# Patient Record
Sex: Female | Born: 1935 | Race: White | Hispanic: No | Smoking: Never smoker
Health system: Southern US, Community
[De-identification: ages and names within clinical notes are randomized; demographics above are authoritative.]

## PROBLEM LIST (undated history)

## (undated) DIAGNOSIS — J449 Chronic obstructive pulmonary disease, unspecified: Secondary | ICD-10-CM

## (undated) DIAGNOSIS — R42 Dizziness and giddiness: Secondary | ICD-10-CM

## (undated) DIAGNOSIS — I471 Supraventricular tachycardia, unspecified: Secondary | ICD-10-CM

## (undated) DIAGNOSIS — T884XXA Failed or difficult intubation, initial encounter: Secondary | ICD-10-CM

## (undated) DIAGNOSIS — K219 Gastro-esophageal reflux disease without esophagitis: Secondary | ICD-10-CM

## (undated) DIAGNOSIS — I219 Acute myocardial infarction, unspecified: Secondary | ICD-10-CM

## (undated) DIAGNOSIS — I255 Ischemic cardiomyopathy: Secondary | ICD-10-CM

## (undated) DIAGNOSIS — I480 Paroxysmal atrial fibrillation: Secondary | ICD-10-CM

## (undated) DIAGNOSIS — I2510 Atherosclerotic heart disease of native coronary artery without angina pectoris: Secondary | ICD-10-CM

## (undated) HISTORY — DX: Supraventricular tachycardia, unspecified: I47.10

## (undated) HISTORY — DX: Acute myocardial infarction, unspecified: I21.9

## (undated) HISTORY — DX: Atherosclerotic heart disease of native coronary artery without angina pectoris: I25.10

## (undated) HISTORY — DX: Dizziness and giddiness: R42

## (undated) HISTORY — DX: Gastro-esophageal reflux disease without esophagitis: K21.9

## (undated) HISTORY — DX: Supraventricular tachycardia: I47.1

## (undated) HISTORY — DX: Ischemic cardiomyopathy: I25.5

## (undated) HISTORY — DX: Paroxysmal atrial fibrillation: I48.0

---

## 2006-05-28 ENCOUNTER — Encounter: Payer: Self-pay | Admitting: Cardiovascular Disease

## 2007-07-17 ENCOUNTER — Encounter: Payer: Self-pay | Admitting: Cardiovascular Disease

## 2010-04-24 ENCOUNTER — Encounter: Payer: Self-pay | Admitting: Cardiovascular Disease

## 2010-08-28 ENCOUNTER — Ambulatory Visit: Payer: Self-pay | Admitting: Cardiovascular Disease

## 2010-08-28 DIAGNOSIS — I10 Essential (primary) hypertension: Secondary | ICD-10-CM | POA: Insufficient documentation

## 2010-08-28 DIAGNOSIS — R002 Palpitations: Secondary | ICD-10-CM

## 2010-11-28 NOTE — Letter (Signed)
Summary: [facility] Stress Echo   [facility] Stress Echo   Imported By: Roderic Ovens 08/22/2010 10:00:03  _____________________________________________________________________  External Attachment:    Type:   Image     Comment:   External Document

## 2010-11-28 NOTE — Assessment & Plan Note (Signed)
Summary: NP6/AMD   Visit Type:  Initial Consult Primary Provider:  Carmine Savoy.  CC:  Establish care with a local Cardiologist. Denies chest pain or shortness of breath.  Occas. has an irregular heart beat.Marland Kitchen  History of Present Illness: Linda Hart is a very pleasant 77 year old woman with a remote history of syncope many years ago with walking, an episode of lightheadedness one year ago while at a funeral who presents to establish care. She is seen to establish care with Dr. Ronna Polio.  She takes metoprolol for hypertension and for palpitations. She states that overall she has been feeling well. She denies any chest pain. She is not reactive. She had a stress test in 2007 where she only exercised for 3 minutes, 5 METS, with normal LV systolic function by echocardiogram. Recommendation was for her to increase her exercise.  She recently moved down from [place] to join her daughter and currently lives at [place] where they do have exercise facilities. She has been a one month.  EKG shows normal sinus rhythm with rate 82 beats per minute, rare PVC, no other significant ST or T wave changes She does not appreciated this ectopy.  Preventive Screening-Counseling & Management  Alcohol-Tobacco     Smoking Status: never  Caffeine-Diet-Exercise     Does Patient Exercise: no  Current Medications (verified): 1)  Metoprolol Succinate 25 Mg Xr24h-Tab (Metoprolol Succinate) .... One Tablet Once Daily 2)  Actonel 150 Mg Tabs (Risedronate Sodium) .... Once A Month  Allergies (verified): 1)  ! Pcn  Past History:  Family History: Last updated: September 22, 2010 Father: Deceased with CHF age 71. Mother: Deceased with CHF age 46  Social History: Last updated: 22-Sep-2010 Retired --Photographer at [employer]  Tobacco Use - No.  Alcohol Use - yes Regular Exercise - no  Risk Factors: Exercise: no (09-22-2010)  Risk Factors: Smoking Status: never (2010-09-22)  Past Medical  History: hypotension  Past Surgical History: c-section  Family History: Father: Deceased with CHF age 47. Mother: Deceased with CHF age 39  Social History: Retired --Photographer at [employer]  Tobacco Use - No.  Alcohol Use - yes Regular Exercise - no Smoking Status:  never Does Patient Exercise:  no  Review of Systems  The patient denies fever, weight loss, weight gain, vision loss, decreased hearing, hoarseness, chest pain, syncope, dyspnea on exertion, peripheral edema, prolonged cough, abdominal pain, incontinence, muscle weakness, depression, and enlarged lymph nodes.    Vital Signs:  Patient profile:   77 year old female Height:      61 inches Weight:      115.75 pounds BMI:     21.95 Pulse rate:   86 / minute BP sitting:   145 / 84  (left arm) Cuff size:   regular  Vitals Entered By: Bishop Dublin, CMA (2010/09/22 10:09 AM)  Physical Exam  General:  Well developed, well nourished, in no acute distress. Head:  normocephalic and atraumatic Neck:  Neck supple, no JVD. No masses, thyromegaly or abnormal cervical nodes. Lungs:  Clear bilaterally to auscultation and percussion. Heart:  Non-displaced PMI, chest non-tender; regular rate and rhythm, S1, S2 without murmurs, rubs or gallops. Carotid upstroke normal, no bruit.  Pedals normal pulses. No edema, no varicosities. Abdomen:  Bowel sounds positive; abdomen soft and non-tender without masses Msk:  Back normal, normal gait. Muscle strength and tone normal. Pulses:  pulses normal in all 4 extremities Extremities:  No clubbing or cyanosis. Neurologic:  Alert and oriented  x 3. Skin:  Intact without lesions or rashes. Psych:  Normal affect.   Impression & Recommendations:  Problem # 1:  HYPERTENSION, BENIGN (ICD-401.1) blood pressure is reasonably well controlled on her current medication regimen. We will prescribe her metoprolol with refills.  Her updated medication list for this problem includes:     Metoprolol Succinate 25 Mg Xr24h-tab (Metoprolol succinate) ..... One tablet once daily  Problem # 2:  PALPITATIONS (ICD-785.1) She has rare palpitations and I suspect that the metoprolol will help him much his symptoms. Overall she feels well with no complaints.  She does have a rare episode of lightheadedness which happened last year. It is uncertain if this was from hypotension in the setting of dehydration and she was not drinking much at the time and had been on her feet all day at a funeral.  The episode of syncope years ago is more concerning for arrhythmia. This was a rare event. I asked her to contact me if she has additional episodes.  Her updated medication list for this problem includes:    Metoprolol Succinate 25 Mg Xr24h-tab (Metoprolol succinate) ..... One tablet once daily  Patient Instructions: 1)  Your physician recommends that you continue on your current medications as directed. Please refer to the Current Medication list given to you today.  Prescriptions: METOPROLOL SUCCINATE 25 MG XR24H-TAB (METOPROLOL SUCCINATE) one tablet once daily  #30 x 6   Entered by:   Linda Needy, RN   Authorized by:   Dossie Arbour MD   Signed by:   Linda Needy, RN on 08/28/2010   Method used:   Electronically to        [facility]* (retail)       [address]       [place], [place]  [address]       Ph: [phone]       Fax: [phone]   RxID:   [id number]   Appended Document: NP6/AMD We recommend that she start an aspirin 81 mg daily. Increase her exercise to several times per week.

## 2010-12-20 ENCOUNTER — Ambulatory Visit: Payer: Self-pay | Admitting: Internal Medicine

## 2011-03-16 ENCOUNTER — Ambulatory Visit (INDEPENDENT_AMBULATORY_CARE_PROVIDER_SITE_OTHER): Payer: Medicare Other | Admitting: *Deleted

## 2011-03-16 DIAGNOSIS — R42 Dizziness and giddiness: Secondary | ICD-10-CM

## 2011-03-21 ENCOUNTER — Encounter: Payer: Self-pay | Admitting: Cardiovascular Disease

## 2011-03-21 ENCOUNTER — Ambulatory Visit (INDEPENDENT_AMBULATORY_CARE_PROVIDER_SITE_OTHER): Payer: Medicare Other | Admitting: Cardiovascular Disease

## 2011-03-21 DIAGNOSIS — I498 Other specified cardiac arrhythmias: Secondary | ICD-10-CM

## 2011-03-21 DIAGNOSIS — I471 Supraventricular tachycardia: Secondary | ICD-10-CM

## 2011-03-21 DIAGNOSIS — F329 Major depressive disorder, single episode, unspecified: Secondary | ICD-10-CM

## 2011-03-21 DIAGNOSIS — R002 Palpitations: Secondary | ICD-10-CM

## 2011-03-21 DIAGNOSIS — I10 Essential (primary) hypertension: Secondary | ICD-10-CM

## 2011-03-21 MED ORDER — METOPROLOL TARTRATE 25 MG PO TABS: 25.0000 mg | ORAL_TABLET | Freq: Two times a day (BID) | ORAL | Status: DC | PRN

## 2011-03-21 NOTE — Assessment & Plan Note (Signed)
We will add metoprolol tartrate for palpitations and sews of dizziness. Uncertain secondary to her short runs of SVT.

## 2011-03-21 NOTE — Assessment & Plan Note (Signed)
She has episodes of short SVT seen on Holter monitor. I suspect she is asymptomatic though she does have rare episodes of dizziness. Some of these episodes of dizziness seem to happen when she turns her head quickly which would imply an alternate cause other than arrhythmia. Possibly vestibular or in her ear.   She does have significant APCs in general. I suggested she try metoprolol tartrate 12.5 mg in the morning. She does not have significant symptoms at nighttime. If she continues to have symptoms, she could try a full 25 mg tablet in the morning.

## 2011-03-21 NOTE — Patient Instructions (Signed)
You are doing well. Please try metoprolol 1/2 dose or full dose as needed in the Am for palpitations. Please call us if you have new issues that need to be addressed before your next appt.  We will call you for a follow up Appt. In 6 months

## 2011-03-21 NOTE — Assessment & Plan Note (Signed)
I am concerned that some of her symptoms could be secondary to underlying stress and possible depression. She recently lost her husband who is in hospice.

## 2011-03-21 NOTE — Progress Notes (Signed)
   Patient ID: Linda Hart, female    DOB: 1925-02-03, 77 y.o.   MRN: [id number]  HPI Comments: Linda Hart is a very pleasant 77 year old woman with a remote history of syncope many years ago with walking, an episode of lightheadedness one year ago while at a funeral,  Patient of  Dr. Ronna Polio, Who presents for routine followup. She has had significant stress recently with the loss of her husband. He was in hospice with end-stage ischemic CM.   She reports that she continues to have rare episodes of dizziness. She has some general malaise, fatigue. She is scheduled to leave tomorrow for [place]. She wore a event monitor and would like to go over these results.  The event monitor showed frequent supraventricular ectopy, rare PVCs. In 48 hours, she had 2000 PVCs, close to 20,000 APCs. She had short runs of SVT, the longest was 11 beats. She had 46 short runs. Uncertain if she was symptomatic during these short runs of SVT  She Used to take metoprolol, is not taking the medication anymore   She had a stress test in 2007 where she only exercised for 3 minutes, 5 METS, with normal LV systolic function by echocardiogram. Recommendation was for her to increase her exercise.   EKG shows normal sinus rhythm with rate 82 beats per minute, rare PVC, no other significant ST or T wave changes She does not appreciated this ectopy.      Review of Systems  Constitutional: Negative.   HENT: Negative.   Eyes: Negative.   Respiratory: Negative.   Cardiovascular: Positive for palpitations.  Gastrointestinal: Negative.   Musculoskeletal: Negative.   Skin: Negative.   Neurological: Positive for dizziness.  Hematological: Negative.   Psychiatric/Behavioral: Negative.   All other systems reviewed and are negative.    BP 122/62  Pulse 76  Ht 5\' 2"  (1.575 m)  Wt 116 lb 1.9 oz (52.672 kg)  BMI 21.24 kg/m2   Physical Exam  Nursing note and vitals reviewed. Constitutional: She is oriented  to person, place, and time. She appears well-developed and well-nourished.  HENT:  Head: Normocephalic.  Nose: Nose normal.  Mouth/Throat: Oropharynx is clear and moist.  Eyes: Conjunctivae are normal. Pupils are equal, round, and reactive to light.  Neck: Normal range of motion. Neck supple. No JVD present.  Cardiovascular: Normal rate, regular rhythm, S1 normal, S2 normal and intact distal pulses.  Frequent extrasystoles are present. Exam reveals no gallop and no friction rub.   Murmur heard.  Crescendo systolic murmur is present with a grade of 2/6  Pulmonary/Chest: Effort normal and breath sounds normal. No respiratory distress. She has no wheezes. She has no rales. She exhibits no tenderness.  Abdominal: Soft. Bowel sounds are normal. She exhibits no distension. There is no tenderness.  Musculoskeletal: Normal range of motion. She exhibits no edema and no tenderness.  Lymphadenopathy:    She has no cervical adenopathy.  Neurological: She is alert and oriented to person, place, and time. Coordination normal.  Skin: Skin is warm and dry. No rash noted. No erythema.  Psychiatric: She has a normal mood and affect. Her behavior is normal. Judgment and thought content normal.         Assessment and Plan

## 2011-05-15 ENCOUNTER — Encounter: Payer: Self-pay | Admitting: Cardiovascular Disease

## 2011-06-20 ENCOUNTER — Ambulatory Visit (INDEPENDENT_AMBULATORY_CARE_PROVIDER_SITE_OTHER): Payer: Medicare Other | Admitting: Cardiovascular Disease

## 2011-06-20 ENCOUNTER — Encounter: Payer: Self-pay | Admitting: Cardiovascular Disease

## 2011-06-20 DIAGNOSIS — I498 Other specified cardiac arrhythmias: Secondary | ICD-10-CM

## 2011-06-20 DIAGNOSIS — F329 Major depressive disorder, single episode, unspecified: Secondary | ICD-10-CM

## 2011-06-20 DIAGNOSIS — I10 Essential (primary) hypertension: Secondary | ICD-10-CM

## 2011-06-20 DIAGNOSIS — R002 Palpitations: Secondary | ICD-10-CM

## 2011-06-20 DIAGNOSIS — I471 Supraventricular tachycardia: Secondary | ICD-10-CM

## 2011-06-20 NOTE — Assessment & Plan Note (Signed)
No symptoms of tachycardia palpitations. She takes metoprolol only p.r.n..

## 2011-06-20 NOTE — Patient Instructions (Signed)
You are doing well. Please monitor your blood pressure at home and call with some numbers.  Or have the nurse fax some numbers to our office Take the metoprolol for blood pressure higher than 160 on the top.  Please call us if you have new issues that need to be addressed before your next appt.  We will call you for a follow up Appt. In 3 months

## 2011-06-20 NOTE — Progress Notes (Signed)
Patient ID: Linda Hart, female    DOB: [birth year], 77 y.o.   MRN: [id number]  HPI Comments: Ms. Linda Hart is a very pleasant 77 year old woman with a remote history of syncope many years ago with walking, an episode of lightheadedness one year ago while at a funeral,  Patient of  Dr. Ronna Polio,  HTN, who presents for routine followup. She has had significant stress earlier this year with the loss of her husband. He was in hospice with end-stage ischemic CM.   She reports that she continues to have rare episodes of dizziness.  She is unable to describe the dizziness well and symptoms are somewhat vague. She reports having her eyes checked recently and these were okay. She is active, able to exert herself without any significant symptoms. She has not been checking her blood pressure. She takes metoprolol rarely and when she does, she generally feels better. She does not like to take medications and does not take metoprolol daily.  Previous event monitor showed frequent supraventricular ectopy, rare PVCs.  She had short runs of SVT, the longest was 11 beats. She had 46 short runs. Uncertain if she was symptomatic during these short runs of SVT   She had a stress test in 2007 where she only exercised for 3 minutes, 5 METS, with normal LV systolic function by echocardiogram. Recommendation was for her to increase her exercise.   EKG shows normal sinus rhythm with rate 79 beats per minute, no other significant ST or T wave changes     Outpatient Encounter Prescriptions as of 06/20/2011  Medication Sig Dispense Refill  . aspirin 81 MG tablet Take 81 mg by mouth daily.        . metoprolol tartrate (LOPRESSOR) 25 MG tablet Take 25 mg by mouth 2 (two) times daily. As needed.       . risedronate (ACTONEL) 150 MG tablet Take 150 mg by mouth every 30 (thirty) days. with water on empty stomach, nothing by mouth or lie down for next 30 minutes.         Review of Systems  Constitutional: Negative.     HENT: Negative.   Eyes: Negative.   Respiratory: Negative.   Gastrointestinal: Negative.   Musculoskeletal: Negative.   Skin: Negative.   Neurological: Positive for dizziness.  Hematological: Negative.   Psychiatric/Behavioral: Negative.   All other systems reviewed and are negative.    BP 160/85  Pulse 90  Ht 5\' 1"  (1.549 m)  Wt 119 lb 12.8 oz (54.341 kg)  BMI 22.64 kg/m2  Physical Exam  Nursing note and vitals reviewed. Constitutional: She is oriented to person, place, and time. She appears well-developed and well-nourished.  HENT:  Head: Normocephalic.  Nose: Nose normal.  Mouth/Throat: Oropharynx is clear and moist.  Eyes: Conjunctivae are normal. Pupils are equal, round, and reactive to light.  Neck: Normal range of motion. Neck supple. No JVD present.  Cardiovascular: Normal rate, regular rhythm, S1 normal, S2 normal and intact distal pulses.  Frequent extrasystoles are present. Exam reveals no gallop and no friction rub.   No murmur heard. Pulmonary/Chest: Effort normal and breath sounds normal. No respiratory distress. She has no wheezes. She has no rales. She exhibits no tenderness.  Abdominal: Soft. Bowel sounds are normal. She exhibits no distension. There is no tenderness.  Musculoskeletal: Normal range of motion. She exhibits no edema and no tenderness.  Lymphadenopathy:    She has no cervical adenopathy.  Neurological: She is alert and oriented to person,  place, and time. Coordination normal.  Skin: Skin is warm and dry. No rash noted. No erythema.  Psychiatric: She has a normal mood and affect. Her behavior is normal. Judgment and thought content normal.         Assessment and Plan

## 2011-06-20 NOTE — Assessment & Plan Note (Signed)
Blood pressure was very elevated today. Some measurements even higher than 160 systolic. She does not check her blood pressure at [facility] and we have suggested she have the nurse there check her blood pressure daily and call us or Dr. Dan Humphreys with the numbers in several days' time. She does have metoprolol at home and I have encouraged her to take this for blood pressure greater than 160. Ideally, if her blood pressure continues to stay this elevated, we will need additional medication. It is certainly possible that her vague symptoms of dizziness or vision issues could be from very elevated blood pressure.

## 2011-06-20 NOTE — Assessment & Plan Note (Signed)
Her mood seems to be improved as she is more social at her living center at [place]. We have encouraged her to increase her exercise.

## 2011-06-21 ENCOUNTER — Telehealth: Payer: Self-pay | Admitting: *Deleted

## 2011-06-21 MED ORDER — AMLODIPINE BESYLATE 10 MG PO TABS: 10.0000 mg | ORAL_TABLET | Freq: Every day | ORAL | Status: DC

## 2011-06-21 NOTE — Telephone Encounter (Signed)
Rn called this AM stating pt's BP elevated at 202/78 after taking her Metoprolol tart 25 this am at 0900. She then went to play cornhole in activity room and became light-headed, so BP was taken with the above result. HR 66. Pt seen yesterday by Dr. Mariah Milling. Per TG, added Norvasc 10mg  once daily. Advised RN to call me back with BP numbers after 1 week of taking new med.

## 2011-07-03 ENCOUNTER — Telehealth: Payer: Self-pay | Admitting: Internal Medicine

## 2011-07-03 DIAGNOSIS — D649 Anemia, unspecified: Secondary | ICD-10-CM

## 2011-07-03 NOTE — Telephone Encounter (Signed)
Linda Hart her daughter called and wanted to know if we can order for her to have her B-12 shots at [facility].  Attention [facility] head nurse.

## 2011-07-03 NOTE — Telephone Encounter (Signed)
That will be fine. We will need to fax over order.

## 2011-07-04 NOTE — Progress Notes (Signed)
   Patient ID: Linda Hart, female    DOB: 04/24/1925, 76 y.o.   MRN: [id number]  HPI Comments: Patient was not seen by a physician. Holter monitor was placed only.      Review of Systems    Physical Exam

## 2011-07-06 MED ORDER — CYANOCOBALAMIN 1000 MCG/ML IJ SOLN: INTRAMUSCULAR | Status: DC

## 2011-07-10 ENCOUNTER — Ambulatory Visit (INDEPENDENT_AMBULATORY_CARE_PROVIDER_SITE_OTHER): Payer: Medicare Other | Admitting: Internal Medicine

## 2011-07-10 ENCOUNTER — Encounter: Payer: Self-pay | Admitting: Internal Medicine

## 2011-07-10 VITALS — BP 141/80 | HR 68 | Temp 97.6°F | Resp 14 | Ht 61.0 in | Wt 117.0 lb

## 2011-07-10 DIAGNOSIS — I10 Essential (primary) hypertension: Secondary | ICD-10-CM

## 2011-07-10 DIAGNOSIS — D510 Vitamin B12 deficiency anemia due to intrinsic factor deficiency: Secondary | ICD-10-CM

## 2011-07-10 DIAGNOSIS — E538 Deficiency of other specified B group vitamins: Secondary | ICD-10-CM

## 2011-07-10 MED ORDER — HYDROCHLOROTHIAZIDE 12.5 MG PO CAPS: 12.5000 mg | ORAL_CAPSULE | Freq: Every day | ORAL | Status: DC

## 2011-07-10 MED ORDER — CYANOCOBALAMIN 1000 MCG/ML IJ SOLN
1000.0000 ug | Freq: Once | INTRAMUSCULAR | Status: AC
  Administered 2011-07-10: 1000 ug via INTRAMUSCULAR

## 2011-07-10 NOTE — Progress Notes (Signed)
Subjective:    Patient ID: Linda Hart, female    DOB: Sep 09, 1925, 77 y.o.   MRN: [id number]  HPI Linda Hart is a 77 year old female who presents for acute visit complaining of bilateral lower extremity edema. She notes that this edema started a couple of weeks ago. She notes use of a new medication, amlodipine. She denies any increased intake of salty foods. She denies any increase time standing on her feet. She denies any shortness of breath, chest pain, or other symptoms.  Outpatient Encounter Prescriptions as of 07/10/2011  Medication Sig Dispense Refill  . amLODipine (NORVASC) 10 MG tablet Take 10 mg by mouth daily.        Marland Kitchen aspirin 81 MG tablet Take 81 mg by mouth daily.        . cyanocobalamin (,VITAMIN B-12,) 1000 MCG/ML injection Inject 1ml once a month intramuscular  1 mL  6  . metoprolol succinate (TOPROL-XL) 25 MG 24 hr tablet Take 25 mg by mouth daily.        Marland Kitchen DISCONTD: metoprolol tartrate (LOPRESSOR) 25 MG tablet Take 25 mg by mouth 2 (two) times daily. As needed.       . hydrochlorothiazide (,MICROZIDE/HYDRODIURIL,) 12.5 MG capsule Take 1 capsule (12.5 mg total) by mouth daily.  30 capsule  11   Facility-Administered Encounter Medications as of 07/10/2011  Medication Dose Route Frequency Provider Last Rate Last Dose  . cyanocobalamin ((VITAMIN B-12)) injection 1,000 mcg  1,000 mcg Intramuscular Once Shelia Media, MD   1,000 mcg at 07/10/11 1208     Review of Systems  Constitutional: Negative for fever, chills, diaphoresis and fatigue.  Respiratory: Negative for cough, chest tightness and shortness of breath.   Cardiovascular: Positive for leg swelling. Negative for chest pain and palpitations.  Skin: Negative for color change.    BP 141/80  Pulse 68  Temp(Src) 97.6 F (36.4 C) (Oral)  Resp 14  Ht 5\' 1"  (1.549 m)  Wt 117 lb (53.071 kg)  BMI 22.11 kg/m2  SpO2 99%     Objective:   Physical Exam  Constitutional: She is oriented to person, place, and time.  She appears well-developed and well-nourished. No distress.  HENT:  Head: Normocephalic and atraumatic.  Right Ear: External ear normal.  Left Ear: External ear normal.  Nose: Nose normal.  Mouth/Throat: Oropharynx is clear and moist. No oropharyngeal exudate.  Eyes: Conjunctivae are normal. Pupils are equal, round, and reactive to light. Right eye exhibits no discharge. Left eye exhibits no discharge. No scleral icterus.  Neck: Normal range of motion. Neck supple. No tracheal deviation present. No thyromegaly present.  Cardiovascular: Normal rate, regular rhythm, normal heart sounds and intact distal pulses.  Exam reveals no gallop and no friction rub.   No murmur heard. Pulmonary/Chest: Effort normal and breath sounds normal. No respiratory distress. She has no wheezes. She has no rales. She exhibits no tenderness.  Musculoskeletal: Normal range of motion. She exhibits edema (bilateral +3 pitting edema in lower extremities to upper calf). She exhibits no tenderness.  Lymphadenopathy:    She has no cervical adenopathy.  Neurological: She is alert and oriented to person, place, and time. No cranial nerve deficit. She exhibits normal muscle tone. Coordination normal.  Skin: Skin is warm and dry. No rash noted. She is not diaphoretic. No erythema. No pallor.  Psychiatric: She has a normal mood and affect. Her behavior is normal. Judgment and thought content normal.          Assessment &  Plan:  1. Edema - patient with new onset edema in her bilateral lower extremities after starting amlodipine. Suspect this is related to use of amlodipine. Will try adding hydrochlorothiazide 12.5 mg daily to see if this will control her edema. She will call or return to clinic if symptoms do not improve.  2. Pernicious anemia - patient with history of B12 deficiency. She is overdue for her B12 shot. We will give this to her today.  She will continue B12 shots on a monthly basis.

## 2011-07-10 NOTE — Patient Instructions (Signed)
Edema Edema is an abnormal build-up of fluids in tissues. Because this is partly dependent on gravity (water flows to the lowest place), it is more common in the lower extremities (legs and thighs). It is also common in the looser tissues, like around the eyes. Painless swelling of the feet and ankles is common and increases as a person ages. It may affect both legs and may include the calves or even thighs. When squeezed, the fluid may move out of the affected area and may leave a dent for a few moments. CAUSES  Prolonged standing or sitting in one place for extended periods of time. Movement helps pump tissue fluid into the veins, and absence of movement prevents this, resulting in edema.   Varicose veins. The valves in the veins do not work as well as they should. This causes fluid to leak into the tissues.   Fluid and salt overload.   Injury, burn, or surgery to the leg, ankle, or foot, may damage veins and allow fluid to leak out.   Sunburn damages vessels. Leaky vessels allow fluid to go out into the sunburned tissues.   Allergies (from insect bites or stings, medications or chemicals) cause swelling by allowing vessels to become leaky.   Protein in the blood helps keep fluid in your vessels. Low protein, as in malnutrition, allows fluid to leak out.   Hormonal changes, including pregnancy and menstruation, cause fluid retention. This fluid may leak out of vessels and cause edema.   Medications that cause fluid retention. Examples are sex hormones, blood pressure medications, steroid treatment, or anti-depressants.   Some illnesses cause edema, especially heart failure, kidney disease, or liver disease.   Surgery that cuts veins or lymph nodes, such as surgery done for the heart or for breast cancer, may result in edema.  DIAGNOSIS Your caregiver is usually easily able to determine what is causing your swelling (edema) by simply asking what is wrong (getting a history) and examining  you (doing a physical). Sometimes x-rays, EKG (electrocardiogram or heart tracing), and blood work may be done to evaluate for underlying medical illness. TREATMENT General treatment includes:  Leg elevation (or elevation of the affected body part).   Restriction of fluid intake.   Prevention of fluid overload.   Compression of the affected body part. Compression with elastic bandages or support stockings squeezes the tissues, preventing fluid from entering and forcing it back into the blood vessels.   Diuretics (also called water pills or fluid pills) pull fluid out of your body in the form of increased urination. These are effective in reducing the swelling, but can have side effects and must be used only under your caregiver's supervision. Diuretics are appropriate only for some types of edema.  The specific treatment can be directed at any underlying causes discovered. Heart, liver, or kidney disease should be treated appropriately. HOME CARE INSTRUCTIONS  Elevate the legs (or affected body part) above the level of the heart, while lying down.   Avoid sitting or standing still for prolonged periods of time.   Avoid putting anything directly under the knees when lying down, and do not wear constricting clothing or garters on the upper legs.   Exercising the legs causes the fluid to work back into the veins and lymphatic channels. This may help the swelling go down.   The pressure applied by elastic bandages or support stockings can help reduce ankle swelling.   A low-salt diet may help reduce fluid retention and decrease the   ankle swelling.   Take any medications exactly as prescribed.  SEEK MEDICAL CARE IF:  Your edema is not responding to recommended treatments.  SEEK IMMEDIATE MEDICAL CARE IF:  You develop shortness of breath or chest pain.   You cannot breathe when you lay down; or if, while lying down, you have to get up and go to the window to get your breath.   You are  having increasing swelling without relief from treatment.   You develop a fever over 101.5.   You develop pain or redness in the areas that are swollen.   Tell your caregiver right away if you have gained 3 in 1 day or 10 in a week.  MAKE SURE YOU:  Understand these instructions.   Will watch your condition.   Will get help right away if you are not doing well or get worse.  Document Released: 10/15/2005 Document Re-Released: 04/04/2010 [facility] Patient Information 2011 [place], [place].

## 2011-08-14 ENCOUNTER — Telehealth: Payer: Self-pay | Admitting: Internal Medicine

## 2011-08-14 NOTE — Telephone Encounter (Signed)
[phone]  Patient daughter called it time to get her prolea shot.  She received it 02/22/11 @ [facility] Please call when you get this in to make an appointment

## 2011-08-16 NOTE — Telephone Encounter (Signed)
I'm working on [employer] shot.

## 2011-09-03 ENCOUNTER — Telehealth: Payer: Self-pay | Admitting: *Deleted

## 2011-09-03 NOTE — Telephone Encounter (Signed)
Spoke w/[facility] in billing - Patient's prolia covered, cost is $148.00. Medication ordered today, need to schedule patient for nurse visit apt for injection. I spoke with patient, she wants me to discuss with her daughter, patient will have daughter call me.

## 2011-09-03 NOTE — Telephone Encounter (Signed)
Patient has been scheduled for her Prolia injection.

## 2011-09-10 ENCOUNTER — Ambulatory Visit (INDEPENDENT_AMBULATORY_CARE_PROVIDER_SITE_OTHER): Payer: Medicare Other | Admitting: *Deleted

## 2011-09-10 DIAGNOSIS — M810 Age-related osteoporosis without current pathological fracture: Secondary | ICD-10-CM

## 2011-09-11 MED ORDER — DENOSUMAB 60 MG/ML ~~LOC~~ SOLN
60.0000 mg | Freq: Once | SUBCUTANEOUS | Status: AC
  Administered 2011-09-11: 60 mg via SUBCUTANEOUS

## 2011-09-17 ENCOUNTER — Ambulatory Visit: Payer: Medicare Other | Admitting: Internal Medicine

## 2011-09-18 ENCOUNTER — Ambulatory Visit: Payer: Medicare Other | Admitting: Internal Medicine

## 2011-11-07 ENCOUNTER — Ambulatory Visit (INDEPENDENT_AMBULATORY_CARE_PROVIDER_SITE_OTHER): Payer: Medicare Other | Admitting: Cardiovascular Disease

## 2011-11-07 ENCOUNTER — Encounter: Payer: Self-pay | Admitting: Cardiovascular Disease

## 2011-11-07 DIAGNOSIS — R002 Palpitations: Secondary | ICD-10-CM | POA: Diagnosis not present

## 2011-11-07 DIAGNOSIS — I471 Supraventricular tachycardia: Secondary | ICD-10-CM

## 2011-11-07 DIAGNOSIS — I10 Essential (primary) hypertension: Secondary | ICD-10-CM | POA: Diagnosis not present

## 2011-11-07 DIAGNOSIS — I498 Other specified cardiac arrhythmias: Secondary | ICD-10-CM | POA: Diagnosis not present

## 2011-11-07 NOTE — Assessment & Plan Note (Signed)
Short runs of SVT noted previously on a vent monitor. PVCs on today's visit. She is relatively asymptomatic and does not want beta blockers.

## 2011-11-07 NOTE — Assessment & Plan Note (Signed)
Blood pressure is well controlled on today's visit. No changes made to the medications. 

## 2011-11-07 NOTE — Progress Notes (Signed)
Patient ID: Linda Hart, female    DOB: June 07, 1925, 78 y.o.   MRN: [id number]  HPI Comments: Linda Hart is a very pleasant 78 year old woman with a remote history of syncope many years ago with walking, an episode of lightheadedness one year ago while at a funeral,  Patient of  Dr. Ronna Polio,  HTN, who presents for routine followup. She has had significant stress earlier this year with the loss of her husband. He was in hospice with end-stage ischemic CM.   Overall, she reports that she is doing well. If she moves quickly from a sitting to standing position or other maneuvers, sometimes she has a short period of palpitations.  Previous monitoring didn't show SVT. EKG today shows PVCs. Otherwise she is active, does not do regular exercise program, But she does walk a long distance in her facility on a regular basis. No significant dizziness.  Previous event monitor showed frequent supraventricular ectopy, rare PVCs.   She had short runs of SVT, the longest was 11 beats. She had 46 short runs. Uncertain if she was symptomatic during these short runs of SVT    She had a stress test in 2007 where she only exercised for 3 minutes, 5 METS, with normal LV systolic function by echocardiogram. Recommendation was for her to increase her exercise.    EKG shows normal sinus rhythm with rate 82 beats per minute, no other significant ST or T wave changes, PVCs noted    Outpatient Encounter Prescriptions as of 11/07/2011  Medication Sig Dispense Refill  . amLODipine (NORVASC) 10 MG tablet Take 10 mg by mouth daily.        Marland Kitchen aspirin 81 MG tablet Take 81 mg by mouth daily.        Marland Kitchen DISCONTD: cyanocobalamin (,VITAMIN B-12,) 1000 MCG/ML injection Inject 1ml once a month intramuscular  1 mL  6  . DISCONTD: hydrochlorothiazide (,MICROZIDE/HYDRODIURIL,) 12.5 MG capsule Take 1 capsule (12.5 mg total) by mouth daily.  30 capsule  11  . DISCONTD: metoprolol succinate (TOPROL-XL) 25 MG 24 hr tablet Take 25 mg  by mouth daily.          Review of Systems  Constitutional: Negative.   HENT: Negative.   Eyes: Negative.   Respiratory: Negative.   Cardiovascular: Positive for palpitations.  Gastrointestinal: Negative.   Musculoskeletal: Negative.   Skin: Negative.   Neurological: Negative.   Hematological: Negative.   Psychiatric/Behavioral: Negative.   All other systems reviewed and are negative.    BP 116/75  Pulse 82  Ht 5' (1.524 m)  Wt 119 lb (53.978 kg)  BMI 23.24 kg/m2  Physical Exam  Nursing note and vitals reviewed. Constitutional: She is oriented to person, place, and time. She appears well-developed and well-nourished.  HENT:  Head: Normocephalic.  Nose: Nose normal.  Mouth/Throat: Oropharynx is clear and moist.  Eyes: Conjunctivae are normal. Pupils are equal, round, and reactive to light.  Neck: Normal range of motion. Neck supple. No JVD present.  Cardiovascular: Normal rate, regular rhythm, S1 normal, S2 normal, normal heart sounds and intact distal pulses.  Exam reveals no gallop and no friction rub.   No murmur heard. Pulmonary/Chest: Effort normal and breath sounds normal. No respiratory distress. She has no wheezes. She has no rales. She exhibits no tenderness.  Abdominal: Soft. Bowel sounds are normal. She exhibits no distension. There is no tenderness.  Musculoskeletal: Normal range of motion. She exhibits no edema and no tenderness.  Lymphadenopathy:    She  has no cervical adenopathy.  Neurological: She is alert and oriented to person, place, and time. Coordination normal.  Skin: Skin is warm and dry. No rash noted. No erythema.  Psychiatric: She has a normal mood and affect. Her behavior is normal. Judgment and thought content normal.         Assessment and Plan

## 2011-11-07 NOTE — Assessment & Plan Note (Signed)
She has rare palpitations but was relatively asymptomatic. We did offer low dose beta blocker p.r.n. Though she does not want to take medications at this time

## 2011-11-07 NOTE — Patient Instructions (Signed)
You are doing well. No medication changes were made.  Please call us if you have new issues that need to be addressed before your next appt.  Your physician wants you to follow-up in: 6 months.  You will receive a reminder letter in the mail two months in advance. If you don't receive a letter, please call our office to schedule the follow-up appointment.   

## 2011-12-05 ENCOUNTER — Ambulatory Visit: Payer: Self-pay | Admitting: Internal Medicine

## 2011-12-05 ENCOUNTER — Ambulatory Visit (INDEPENDENT_AMBULATORY_CARE_PROVIDER_SITE_OTHER): Payer: Medicare Other | Admitting: Internal Medicine

## 2011-12-05 ENCOUNTER — Encounter: Payer: Self-pay | Admitting: Internal Medicine

## 2011-12-05 VITALS — BP 136/62 | HR 96 | Temp 97.5°F | Ht 60.0 in | Wt 118.0 lb

## 2011-12-05 DIAGNOSIS — R209 Unspecified disturbances of skin sensation: Secondary | ICD-10-CM

## 2011-12-05 DIAGNOSIS — R200 Anesthesia of skin: Secondary | ICD-10-CM

## 2011-12-05 DIAGNOSIS — R002 Palpitations: Secondary | ICD-10-CM

## 2011-12-05 DIAGNOSIS — G319 Degenerative disease of nervous system, unspecified: Secondary | ICD-10-CM | POA: Diagnosis not present

## 2011-12-05 MED ORDER — CARVEDILOL 6.25 MG PO TABS: 6.2500 mg | ORAL_TABLET | Freq: Two times a day (BID) | ORAL | Status: DC

## 2011-12-05 NOTE — Assessment & Plan Note (Addendum)
Patient history of heart palpitations. Exam is remarkable for mild tachycardia. Reviewed her notes from cardiology. Will try starting her on carvedilol to see if this helps with symptoms. Followup in one week.

## 2011-12-05 NOTE — Progress Notes (Signed)
Subjective:    Patient ID: Linda Hart, female    DOB: 06-02-1925, 78 y.o.   MRN: [id number]  HPI 78 year old female with history of palpitations presents for acute visit complaining of worsening of her palpitations, lower extremity edema, an episode earlier this week in which her right leg became numb. In regards to her palpitations, they are intermittent and have been occurring for years. She reports that recently they are worsening. She describes these as a rapid regular beats. She occasionally has tightness across to her chest and shortness of breath, particularly on exertion. She was seen by her cardiologist last month and recommendation made to start a beta blocker, however she was reluctant to add a new medication at that time. She is interested in starting medication now to help control her heart rate.  She is also concerned today about lower extremity edema. She is concerned that this may be related to the use of amlodipine. She notes that she has compression stockings but does not regularly wear these.  She also notes an episode which occurred yesterday during which her right leg became numb. This occurred while she was standing. She reports that her entire right leg was numb. There were no paresthesias or pain associated with the numbness. She did not have any known injury to her back or leg. The leg was not week. She was able to ambulate. The episode lasted for several minutes and then subsided without intervention. She denies any slurred speech, confusion, or other focal neurologic changes during that time.  Outpatient Encounter Prescriptions as of 12/05/2011  Medication Sig Dispense Refill  . amLODipine (NORVASC) 10 MG tablet Take 10 mg by mouth daily.        Marland Kitchen aspirin 81 MG tablet Take 81 mg by mouth daily.        . carvedilol (COREG) 6.25 MG tablet Take 1 tablet (6.25 mg total) by mouth 2 (two) times daily with a meal.  60 tablet  3    Review of Systems  Constitutional: Negative  for fever, chills, appetite change, fatigue and unexpected weight change.  HENT: Negative for ear pain, congestion, sore throat, trouble swallowing, neck pain, voice change and sinus pressure.   Eyes: Negative for visual disturbance.  Respiratory: Positive for shortness of breath. Negative for cough, wheezing and stridor.   Cardiovascular: Positive for chest pain, palpitations and leg swelling.  Gastrointestinal: Negative for nausea, vomiting, abdominal pain, diarrhea, constipation, blood in stool, abdominal distention and anal bleeding.  Genitourinary: Negative for dysuria and flank pain.  Musculoskeletal: Negative for myalgias, arthralgias and gait problem.  Skin: Negative for color change and rash.  Neurological: Positive for numbness. Negative for dizziness, seizures, syncope, facial asymmetry, speech difficulty, weakness, light-headedness and headaches.  Hematological: Negative for adenopathy. Does not bruise/bleed easily.  Psychiatric/Behavioral: Negative for suicidal ideas, sleep disturbance and dysphoric mood. The patient is not nervous/anxious.    BP 136/62  Pulse 96  Temp(Src) 97.5 F (36.4 C) (Oral)  Ht 5' (1.524 m)  Wt 118 lb (53.524 kg)  BMI 23.05 kg/m2  SpO2 100%     Objective:   Physical Exam  Constitutional: She is oriented to person, place, and time. She appears well-developed and well-nourished. No distress.  HENT:  Head: Normocephalic and atraumatic.  Right Ear: External ear normal.  Left Ear: External ear normal.  Nose: Nose normal.  Mouth/Throat: Oropharynx is clear and moist. No oropharyngeal exudate.  Eyes: Conjunctivae are normal. Pupils are equal, round, and reactive to light. Right eye  exhibits no discharge. Left eye exhibits no discharge. No scleral icterus.  Neck: Normal range of motion. Neck supple. No tracheal deviation present. No thyromegaly present.  Cardiovascular: Normal rate, regular rhythm, normal heart sounds and intact distal pulses.  Exam  reveals no gallop and no friction rub.   No murmur heard. Pulmonary/Chest: Effort normal and breath sounds normal. No respiratory distress. She has no wheezes. She has no rales. She exhibits no tenderness.  Musculoskeletal: Normal range of motion. She exhibits no edema and no tenderness.  Lymphadenopathy:    She has no cervical adenopathy.  Neurological: She is alert and oriented to person, place, and time. No cranial nerve deficit. She exhibits normal muscle tone. Coordination normal.  Skin: Skin is warm and dry. No rash noted. She is not diaphoretic. No erythema. No pallor.  Psychiatric: She has a normal mood and affect. Her behavior is normal. Judgment and thought content normal.          Assessment & Plan:

## 2011-12-05 NOTE — Assessment & Plan Note (Addendum)
Symptoms are concerning for TIA or stroke. Neurological exam, however, is normal. Will get CT head today for further evaluation. If CT is normal and symptoms are recurrent then we'll proceed with MRI brain. Follow up 1 week.

## 2011-12-06 ENCOUNTER — Encounter: Payer: Self-pay | Admitting: Internal Medicine

## 2011-12-12 DIAGNOSIS — H2510 Age-related nuclear cataract, unspecified eye: Secondary | ICD-10-CM | POA: Diagnosis not present

## 2011-12-12 DIAGNOSIS — Z961 Presence of intraocular lens: Secondary | ICD-10-CM | POA: Diagnosis not present

## 2012-01-02 ENCOUNTER — Encounter: Payer: Self-pay | Admitting: Internal Medicine

## 2012-01-15 ENCOUNTER — Encounter: Payer: Self-pay | Admitting: Internal Medicine

## 2012-02-14 ENCOUNTER — Telehealth: Payer: Self-pay | Admitting: Internal Medicine

## 2012-02-14 ENCOUNTER — Encounter: Payer: Self-pay | Admitting: Internal Medicine

## 2012-02-14 ENCOUNTER — Ambulatory Visit (INDEPENDENT_AMBULATORY_CARE_PROVIDER_SITE_OTHER): Payer: Medicare Other | Admitting: Internal Medicine

## 2012-02-14 VITALS — BP 138/70 | HR 65 | Temp 98.7°F | Wt 117.8 lb

## 2012-02-14 DIAGNOSIS — I10 Essential (primary) hypertension: Secondary | ICD-10-CM | POA: Diagnosis not present

## 2012-02-14 LAB — LIPID PANEL
Cholesterol: 231 mg/dL — ABNORMAL HIGH (ref 0–200)
HDL: 58.5 mg/dL (ref >39.00)
Total CHOL/HDL Ratio: 4
VLDL: 23.6 mg/dL (ref 0.0–40.0)

## 2012-02-14 LAB — COMPREHENSIVE METABOLIC PANEL
Alkaline Phosphatase: 74 U/L (ref 39–117)
Creatinine, Ser: 1.3 mg/dL — ABNORMAL HIGH (ref 0.4–1.2)
GFR: 40.01 mL/min — ABNORMAL LOW (ref >60.00)
Glucose, Bld: 68 mg/dL — ABNORMAL LOW (ref 70–99)
Sodium: 138 mEq/L (ref 135–145)
Total Bilirubin: 0.4 mg/dL (ref 0.3–1.2)
Total Protein: 6.5 g/dL (ref 6.0–8.3)

## 2012-02-14 NOTE — Telephone Encounter (Signed)
Follow up.

## 2012-02-14 NOTE — Progress Notes (Signed)
  Subjective:    Patient ID: Linda Hart, female    DOB: [birth year], 78 y.o.   MRN: [id number]  HPI 78YO female with h/o hypertension and palpitations presents for follow up. She has been well. She denies any palpitations, chest pain, or recurrent episodes of right leg numbness (which she had complained of at previous visit).  Energy level is good. She is walking daily. Appetite good.  Outpatient Encounter Prescriptions as of 02/14/2012  Medication Sig Dispense Refill  . carvedilol (COREG) 6.25 MG tablet Take 1 tablet (6.25 mg total) by mouth 2 (two) times daily with a meal.  60 tablet  3  . aspirin 81 MG tablet Take 81 mg by mouth daily.        Marland Kitchen DISCONTD: amLODipine (NORVASC) 10 MG tablet Take 10 mg by mouth daily.          Review of Systems  Constitutional: Negative for fever, chills, appetite change, fatigue and unexpected weight change.  HENT: Negative for ear pain, congestion, sore throat, trouble swallowing, neck pain, voice change and sinus pressure.   Eyes: Negative for visual disturbance.  Respiratory: Negative for cough, shortness of breath, wheezing and stridor.   Cardiovascular: Negative for chest pain, palpitations and leg swelling.  Gastrointestinal: Negative for nausea, vomiting, abdominal pain, diarrhea, constipation, blood in stool, abdominal distention and anal bleeding.  Genitourinary: Negative for dysuria and flank pain.  Musculoskeletal: Negative for myalgias, arthralgias and gait problem.  Skin: Negative for color change and rash.  Neurological: Negative for dizziness and headaches.  Hematological: Negative for adenopathy. Does not bruise/bleed easily.  Psychiatric/Behavioral: Negative for suicidal ideas, sleep disturbance and dysphoric mood. The patient is not nervous/anxious.    BP 138/70  Pulse 65  Temp(Src) 98.7 F (37.1 C) (Oral)  Wt 117 lb 12 oz (53.411 kg)     Objective:   Physical Exam  Constitutional: She is oriented to person, place, and  time. She appears well-developed and well-nourished. No distress.  HENT:  Head: Normocephalic and atraumatic.  Right Ear: External ear normal.  Left Ear: External ear normal.  Nose: Nose normal.  Mouth/Throat: Oropharynx is clear and moist. No oropharyngeal exudate.  Eyes: Conjunctivae are normal. Pupils are equal, round, and reactive to light. Right eye exhibits no discharge. Left eye exhibits no discharge. No scleral icterus.  Neck: Normal range of motion. Neck supple. No tracheal deviation present. No thyromegaly present.  Cardiovascular: Normal rate, regular rhythm, normal heart sounds and intact distal pulses.  Exam reveals no gallop and no friction rub.   No murmur heard. Pulmonary/Chest: Effort normal and breath sounds normal. No respiratory distress. She has no wheezes. She has no rales. She exhibits no tenderness.  Musculoskeletal: Normal range of motion. She exhibits no edema and no tenderness.  Lymphadenopathy:    She has no cervical adenopathy.  Neurological: She is alert and oriented to person, place, and time. No cranial nerve deficit. She exhibits normal muscle tone. Coordination normal.  Skin: Skin is warm and dry. No rash noted. She is not diaphoretic. No erythema. No pallor.  Psychiatric: She has a normal mood and affect. Her behavior is normal. Judgment and thought content normal.          Assessment & Plan:

## 2012-02-14 NOTE — Assessment & Plan Note (Signed)
BP well controlled today off Amlodipine. Will continue Carvedilol alone.  Will check renal function with labs today. Follow up 3 months and prn.

## 2012-03-10 ENCOUNTER — Ambulatory Visit (INDEPENDENT_AMBULATORY_CARE_PROVIDER_SITE_OTHER): Payer: Medicare Other | Admitting: *Deleted

## 2012-03-10 DIAGNOSIS — M810 Age-related osteoporosis without current pathological fracture: Secondary | ICD-10-CM

## 2012-03-10 MED ORDER — DENOSUMAB 60 MG/ML ~~LOC~~ SOLN
60.0000 mg | Freq: Once | SUBCUTANEOUS | Status: AC
  Administered 2012-03-10: 60 mg via SUBCUTANEOUS

## 2012-04-14 ENCOUNTER — Other Ambulatory Visit: Payer: Self-pay | Admitting: Internal Medicine

## 2012-05-08 ENCOUNTER — Ambulatory Visit (INDEPENDENT_AMBULATORY_CARE_PROVIDER_SITE_OTHER): Payer: Medicare Other | Admitting: Internal Medicine

## 2012-05-08 ENCOUNTER — Encounter: Payer: Self-pay | Admitting: Internal Medicine

## 2012-05-08 VITALS — BP 110/80 | HR 61 | Temp 98.9°F | Ht 61.0 in | Wt 119.5 lb

## 2012-05-08 DIAGNOSIS — I10 Essential (primary) hypertension: Secondary | ICD-10-CM

## 2012-05-08 LAB — COMPREHENSIVE METABOLIC PANEL
AST: 28 U/L (ref 0–37)
Albumin: 3.3 g/dL — ABNORMAL LOW (ref 3.5–5.2)
Alkaline Phosphatase: 71 U/L (ref 39–117)
BUN: 27 mg/dL — ABNORMAL HIGH (ref 6–23)
Creatinine, Ser: 1.3 mg/dL — ABNORMAL HIGH (ref 0.4–1.2)
Glucose, Bld: 68 mg/dL — ABNORMAL LOW (ref 70–99)
Potassium: 4.6 mEq/L (ref 3.5–5.1)
Total Bilirubin: 0.6 mg/dL (ref 0.3–1.2)

## 2012-05-08 MED ORDER — CARVEDILOL 6.25 MG PO TABS: 6.2500 mg | ORAL_TABLET | Freq: Two times a day (BID) | ORAL | Status: DC

## 2012-05-08 NOTE — Progress Notes (Signed)
  Subjective:    Patient ID: Linda Hart, female    DOB: 04-Oct-1925, 78 y.o.   MRN: [id number]  HPI 78YO female with HTN presents for follow up. Doing well. No complaints today. Reports full compliance with medications. At last visit,noted to have moderate dehydration on labs.  Reports no improvement in po intake of fluids since last visit, has trouble remembering to drink fluids.  Outpatient Encounter Prescriptions as of 05/08/2012  Medication Sig Dispense Refill  . aspirin 81 MG tablet Take 81 mg by mouth daily.        . carvedilol (COREG) 6.25 MG tablet Take 1 tablet (6.25 mg total) by mouth 2 (two) times daily with a meal.  60 tablet  6   BP 110/80  Pulse 61  Temp 98.9 F (37.2 C) (Oral)  Ht 5\' 1"  (1.549 m)  Wt 119 lb 8 oz (54.205 kg)  BMI 22.58 kg/m2  SpO2 97%  Review of Systems  Constitutional: Negative for fever, chills, appetite change, fatigue and unexpected weight change.  HENT: Negative for ear pain, congestion, sore throat, trouble swallowing, neck pain, voice change and sinus pressure.   Eyes: Negative for visual disturbance.  Respiratory: Negative for cough, shortness of breath, wheezing and stridor.   Cardiovascular: Negative for chest pain, palpitations and leg swelling.  Gastrointestinal: Negative for nausea, vomiting, abdominal pain, diarrhea, constipation, blood in stool, abdominal distention and anal bleeding.  Genitourinary: Negative for dysuria and flank pain.  Musculoskeletal: Negative for myalgias, arthralgias and gait problem.  Skin: Negative for color change and rash.  Neurological: Negative for dizziness and headaches.  Hematological: Negative for adenopathy. Does not bruise/bleed easily.  Psychiatric/Behavioral: Negative for suicidal ideas, disturbed wake/sleep cycle and dysphoric mood. The patient is not nervous/anxious.        Objective:   Physical Exam  Constitutional: She is oriented to person, place, and time. She appears well-developed and  well-nourished. No distress.  HENT:  Head: Normocephalic and atraumatic.  Right Ear: External ear normal.  Left Ear: External ear normal.  Nose: Nose normal.  Mouth/Throat: Oropharynx is clear and moist. No oropharyngeal exudate.  Eyes: Conjunctivae are normal. Pupils are equal, round, and reactive to light. Right eye exhibits no discharge. Left eye exhibits no discharge. No scleral icterus.  Neck: Normal range of motion. Neck supple. No tracheal deviation present. No thyromegaly present.  Cardiovascular: Normal rate, regular rhythm, normal heart sounds and intact distal pulses.  Exam reveals no gallop and no friction rub.   No murmur heard. Pulmonary/Chest: Effort normal and breath sounds normal. No respiratory distress. She has no wheezes. She has no rales. She exhibits no tenderness.  Musculoskeletal: Normal range of motion. She exhibits no edema and no tenderness.  Lymphadenopathy:    She has no cervical adenopathy.  Neurological: She is alert and oriented to person, place, and time. No cranial nerve deficit. She exhibits normal muscle tone. Coordination normal.  Skin: Skin is warm and dry. No rash noted. She is not diaphoretic. No erythema. No pallor.  Psychiatric: She has a normal mood and affect. Her behavior is normal. Judgment and thought content normal.          Assessment & Plan:

## 2012-05-08 NOTE — Assessment & Plan Note (Signed)
BP well controlled on carvedilol. Will continue. Will recheck renal function with labs today, as moderated dehydration noted on last labs from 01/2012. Follow up 6 months and prn.

## 2012-06-10 ENCOUNTER — Ambulatory Visit: Payer: Medicare Other | Admitting: Cardiovascular Disease

## 2012-06-16 ENCOUNTER — Encounter: Payer: Self-pay | Admitting: Cardiovascular Disease

## 2012-06-16 ENCOUNTER — Ambulatory Visit: Payer: Medicare Other | Admitting: Cardiovascular Disease

## 2012-06-16 ENCOUNTER — Ambulatory Visit (INDEPENDENT_AMBULATORY_CARE_PROVIDER_SITE_OTHER): Payer: Medicare Other | Admitting: Cardiovascular Disease

## 2012-06-16 VITALS — BP 108/70 | HR 70 | Ht 61.0 in | Wt 118.5 lb

## 2012-06-16 DIAGNOSIS — I498 Other specified cardiac arrhythmias: Secondary | ICD-10-CM | POA: Diagnosis not present

## 2012-06-16 DIAGNOSIS — I471 Supraventricular tachycardia: Secondary | ICD-10-CM

## 2012-06-16 DIAGNOSIS — I10 Essential (primary) hypertension: Secondary | ICD-10-CM

## 2012-06-16 MED ORDER — ATORVASTATIN CALCIUM 10 MG PO TABS: 10.0000 mg | ORAL_TABLET | Freq: Every day | ORAL | Status: DC

## 2012-06-16 NOTE — Progress Notes (Signed)
Patient ID: Linda Hart, female    DOB: [birth year], 78 y.o.   MRN: [id number]  HPI Comments: Ms. Linda Hart is a very pleasant 78 year old woman with a remote history of syncope many years ago with walking, an episode of lightheadedness one year ago while at a funeral,  Patient of  Dr. Ronna Polio,  HTN, who presents for routine followup. She has had significant stresswith the loss of her husband. He was in hospice with end-stage ischemic CM.   Overall, she reports that she is doing well. she is active, does not do regular exercise program, But she does walk a long distance in her facility on a regular basis. No significant dizziness. She currently lives at [facility]  Previous event monitor showed frequent supraventricular ectopy, rare PVCs.   She had short runs of SVT, the longest was 11 beats. She had 46 short runs. Uncertain if she was symptomatic during these short runs of SVT    She had a stress test in 2007 where she only exercised for 3 minutes, 5 METS, with normal LV systolic function by echocardiogram. Recommendation was for her to increase her exercise.    EKG shows normal sinus rhythm with rate 70 beats per minute, no other significant ST or T wave changes, PVCs noted   Outpatient Encounter Prescriptions as of 06/16/2012  Medication Sig Dispense Refill  . aspirin 81 MG tablet Take 81 mg by mouth daily.        . carvedilol (COREG) 6.25 MG tablet Take 1 tablet (6.25 mg total) by mouth 2 (two) times daily with a meal.  60 tablet  6  . Cyanocobalamin (VITAMIN B-12 IJ) Inject as directed every 30 (thirty) days.      Marland Kitchen atorvastatin (LIPITOR) 10 MG tablet Take 1 tablet (10 mg total) by mouth daily.  90 tablet  3   Review of Systems  Constitutional: Negative.   HENT: Negative.   Eyes: Negative.   Respiratory: Negative.   Cardiovascular: Positive for palpitations.  Gastrointestinal: Negative.   Musculoskeletal: Negative.   Skin: Negative.   Neurological: Negative.   Hematological:  Negative.   Psychiatric/Behavioral: Negative.   All other systems reviewed and are negative.    BP 108/70  Pulse 70  Ht 5\' 1"  (1.549 m)  Wt 118 lb 8 oz (53.751 kg)  BMI 22.39 kg/m2  Physical Exam  Nursing note and vitals reviewed. Constitutional: She is oriented to person, place, and time. She appears well-developed and well-nourished.  HENT:  Head: Normocephalic.  Nose: Nose normal.  Mouth/Throat: Oropharynx is clear and moist.  Eyes: Conjunctivae are normal. Pupils are equal, round, and reactive to light.  Neck: Normal range of motion. Neck supple. No JVD present.  Cardiovascular: Normal rate, regular rhythm, S1 normal, S2 normal, normal heart sounds and intact distal pulses.  Exam reveals no gallop and no friction rub.   No murmur heard. Pulmonary/Chest: Effort normal and breath sounds normal. No respiratory distress. She has no wheezes. She has no rales. She exhibits no tenderness.  Abdominal: Soft. Bowel sounds are normal. She exhibits no distension. There is no tenderness.  Musculoskeletal: Normal range of motion. She exhibits no edema and no tenderness.  Lymphadenopathy:    She has no cervical adenopathy.  Neurological: She is alert and oriented to person, place, and time. Coordination normal.  Skin: Skin is warm and dry. No rash noted. No erythema.  Psychiatric: She has a normal mood and affect. Her behavior is normal. Judgment and thought content normal.  Assessment and Plan

## 2012-06-16 NOTE — Patient Instructions (Addendum)
You are doing well. Please start atorvastatin for cholesterol, one a day at night  Please call us if you have new issues that need to be addressed before your next appt.  Your physician wants you to follow-up in: 12 months.  You will receive a reminder letter in the mail two months in advance. If you don't receive a letter, please call our office to schedule the follow-up appointment.

## 2012-06-16 NOTE — Assessment & Plan Note (Signed)
Blood pressure is well controlled on today's visit. No changes made to the medications. 

## 2012-06-16 NOTE — Assessment & Plan Note (Signed)
No significant arrhythmia. No further workup at this time

## 2012-07-31 ENCOUNTER — Other Ambulatory Visit: Payer: Self-pay | Admitting: Internal Medicine

## 2012-09-09 ENCOUNTER — Ambulatory Visit (INDEPENDENT_AMBULATORY_CARE_PROVIDER_SITE_OTHER): Payer: Medicare Other | Admitting: *Deleted

## 2012-09-09 DIAGNOSIS — M810 Age-related osteoporosis without current pathological fracture: Secondary | ICD-10-CM | POA: Diagnosis not present

## 2012-09-09 MED ORDER — DENOSUMAB 60 MG/ML ~~LOC~~ SOLN
60.0000 mg | Freq: Once | SUBCUTANEOUS | Status: AC
  Administered 2012-09-09: 60 mg via SUBCUTANEOUS

## 2012-12-13 ENCOUNTER — Other Ambulatory Visit: Payer: Self-pay

## 2012-12-30 ENCOUNTER — Other Ambulatory Visit: Payer: Self-pay | Admitting: Internal Medicine

## 2013-02-25 DIAGNOSIS — D3130 Benign neoplasm of unspecified choroid: Secondary | ICD-10-CM | POA: Diagnosis not present

## 2013-02-26 HISTORY — PX: CORONARY ANGIOPLASTY WITH STENT PLACEMENT: SHX49

## 2013-03-05 ENCOUNTER — Inpatient Hospital Stay: Payer: Self-pay | Admitting: Internal Medicine

## 2013-03-05 DIAGNOSIS — I252 Old myocardial infarction: Secondary | ICD-10-CM | POA: Diagnosis not present

## 2013-03-05 DIAGNOSIS — G20 Parkinson's disease: Secondary | ICD-10-CM | POA: Diagnosis not present

## 2013-03-05 DIAGNOSIS — R079 Chest pain, unspecified: Secondary | ICD-10-CM | POA: Diagnosis not present

## 2013-03-05 DIAGNOSIS — D72829 Elevated white blood cell count, unspecified: Secondary | ICD-10-CM | POA: Diagnosis not present

## 2013-03-05 DIAGNOSIS — B952 Enterococcus as the cause of diseases classified elsewhere: Secondary | ICD-10-CM | POA: Diagnosis present

## 2013-03-05 DIAGNOSIS — R072 Precordial pain: Secondary | ICD-10-CM | POA: Diagnosis not present

## 2013-03-05 DIAGNOSIS — I2510 Atherosclerotic heart disease of native coronary artery without angina pectoris: Secondary | ICD-10-CM | POA: Diagnosis present

## 2013-03-05 DIAGNOSIS — G20A1 Parkinson's disease without dyskinesia, without mention of fluctuations: Secondary | ICD-10-CM | POA: Diagnosis not present

## 2013-03-05 DIAGNOSIS — I10 Essential (primary) hypertension: Secondary | ICD-10-CM | POA: Diagnosis present

## 2013-03-05 DIAGNOSIS — I4891 Unspecified atrial fibrillation: Secondary | ICD-10-CM | POA: Diagnosis not present

## 2013-03-05 DIAGNOSIS — I214 Non-ST elevation (NSTEMI) myocardial infarction: Secondary | ICD-10-CM | POA: Diagnosis not present

## 2013-03-05 DIAGNOSIS — I059 Rheumatic mitral valve disease, unspecified: Secondary | ICD-10-CM | POA: Diagnosis present

## 2013-03-05 DIAGNOSIS — Z880 Allergy status to penicillin: Secondary | ICD-10-CM | POA: Diagnosis not present

## 2013-03-05 DIAGNOSIS — Z8249 Family history of ischemic heart disease and other diseases of the circulatory system: Secondary | ICD-10-CM | POA: Diagnosis not present

## 2013-03-05 DIAGNOSIS — I200 Unstable angina: Secondary | ICD-10-CM | POA: Diagnosis not present

## 2013-03-05 DIAGNOSIS — I5021 Acute systolic (congestive) heart failure: Secondary | ICD-10-CM | POA: Diagnosis present

## 2013-03-05 DIAGNOSIS — Z5189 Encounter for other specified aftercare: Secondary | ICD-10-CM | POA: Diagnosis not present

## 2013-03-05 DIAGNOSIS — N390 Urinary tract infection, site not specified: Secondary | ICD-10-CM | POA: Diagnosis present

## 2013-03-05 DIAGNOSIS — E538 Deficiency of other specified B group vitamins: Secondary | ICD-10-CM | POA: Diagnosis present

## 2013-03-05 DIAGNOSIS — N179 Acute kidney failure, unspecified: Secondary | ICD-10-CM | POA: Diagnosis not present

## 2013-03-05 LAB — BASIC METABOLIC PANEL
Anion Gap: 10 (ref 7–16)
BUN: 26 mg/dL — ABNORMAL HIGH (ref 7–18)
Calcium, Total: 8.9 mg/dL (ref 8.5–10.1)
Chloride: 105 mmol/L (ref 98–107)
EGFR (African American): 33 — ABNORMAL LOW
EGFR (Non-African Amer.): 29 — ABNORMAL LOW
Glucose: 148 mg/dL — ABNORMAL HIGH (ref 65–99)
Osmolality: 279 (ref 275–301)
Potassium: 4.5 mmol/L (ref 3.5–5.1)
Sodium: 136 mmol/L (ref 136–145)

## 2013-03-05 LAB — TROPONIN I: Troponin-I: 9.05 ng/mL — ABNORMAL HIGH

## 2013-03-05 LAB — HEPATIC FUNCTION PANEL A (ARMC)
Bilirubin,Total: 0.6 mg/dL (ref 0.2–1.0)
SGOT(AST): 95 U/L — ABNORMAL HIGH (ref 15–37)
Total Protein: 6.8 g/dL (ref 6.4–8.2)

## 2013-03-05 LAB — PRO B NATRIURETIC PEPTIDE: B-Type Natriuretic Peptide: 33065 pg/mL — ABNORMAL HIGH (ref 0–450)

## 2013-03-05 LAB — CBC
HCT: 38.7 % (ref 35.0–47.0)
HGB: 12.6 g/dL (ref 12.0–16.0)
MCH: 29.5 pg (ref 26.0–34.0)
RBC: 4.27 10*6/uL (ref 3.80–5.20)
RDW: 13.6 % (ref 11.5–14.5)
WBC: 14.4 10*3/uL — ABNORMAL HIGH (ref 3.6–11.0)

## 2013-03-06 DIAGNOSIS — I059 Rheumatic mitral valve disease, unspecified: Secondary | ICD-10-CM

## 2013-03-06 DIAGNOSIS — I2510 Atherosclerotic heart disease of native coronary artery without angina pectoris: Secondary | ICD-10-CM

## 2013-03-06 DIAGNOSIS — I480 Paroxysmal atrial fibrillation: Secondary | ICD-10-CM

## 2013-03-06 DIAGNOSIS — I214 Non-ST elevation (NSTEMI) myocardial infarction: Secondary | ICD-10-CM

## 2013-03-06 DIAGNOSIS — R072 Precordial pain: Secondary | ICD-10-CM | POA: Diagnosis not present

## 2013-03-06 HISTORY — DX: Atherosclerotic heart disease of native coronary artery without angina pectoris: I25.10

## 2013-03-06 HISTORY — DX: Paroxysmal atrial fibrillation: I48.0

## 2013-03-06 LAB — URINALYSIS, COMPLETE
Glucose,UR: NEGATIVE mg/dL (ref 0–75)
Hyaline Cast: 4
Nitrite: NEGATIVE
Ph: 5 (ref 4.5–8.0)
Protein: NEGATIVE
RBC,UR: 2 /HPF (ref 0–5)
Specific Gravity: 1.016 (ref 1.003–1.030)
Squamous Epithelial: 1
WBC UR: 57 /HPF (ref 0–5)

## 2013-03-06 LAB — LIPID PANEL
Cholesterol: 121 mg/dL (ref 0–200)
HDL Cholesterol: 60 mg/dL (ref 40–60)
Triglycerides: 45 mg/dL (ref 0–200)

## 2013-03-06 LAB — TROPONIN I: Troponin-I: >40 ng/mL

## 2013-03-06 LAB — BASIC METABOLIC PANEL
Anion Gap: 7 (ref 7–16)
BUN: 24 mg/dL — ABNORMAL HIGH (ref 7–18)
Calcium, Total: 7.8 mg/dL — ABNORMAL LOW (ref 8.5–10.1)
Chloride: 110 mmol/L — ABNORMAL HIGH (ref 98–107)
Co2: 21 mmol/L (ref 21–32)
Creatinine: 1.27 mg/dL (ref 0.60–1.30)
EGFR (African American): 45 — ABNORMAL LOW
EGFR (Non-African Amer.): 38 — ABNORMAL LOW
Glucose: 105 mg/dL — ABNORMAL HIGH (ref 65–99)
Osmolality: 280 (ref 275–301)
Potassium: 4.2 mmol/L (ref 3.5–5.1)
Sodium: 138 mmol/L (ref 136–145)

## 2013-03-06 LAB — CK TOTAL AND CKMB (NOT AT ARMC): CK-MB: 74.3 ng/mL — ABNORMAL HIGH (ref 0.5–3.6)

## 2013-03-07 LAB — BASIC METABOLIC PANEL
Anion Gap: 8 (ref 7–16)
BUN: 27 mg/dL — ABNORMAL HIGH (ref 7–18)
Co2: 22 mmol/L (ref 21–32)
Glucose: 136 mg/dL — ABNORMAL HIGH (ref 65–99)
Osmolality: 279 (ref 275–301)

## 2013-03-08 LAB — CBC WITH DIFFERENTIAL/PLATELET
Basophil #: 0 10*3/uL (ref 0.0–0.1)
Basophil %: 0.3 %
Eosinophil %: 0.7 %
HGB: 10.8 g/dL — ABNORMAL LOW (ref 12.0–16.0)
Lymphocyte #: 2.1 10*3/uL (ref 1.0–3.6)
Lymphocyte %: 11.1 %
MCH: 30.1 pg (ref 26.0–34.0)
MCHC: 33.6 g/dL (ref 32.0–36.0)
MCV: 90 fL (ref 80–100)
Monocyte #: 2.4 x10 3/mm — ABNORMAL HIGH (ref 0.2–0.9)
Monocyte %: 13 %
Neutrophil #: 13.9 10*3/uL — ABNORMAL HIGH (ref 1.4–6.5)
Platelet: 177 10*3/uL (ref 150–440)
RDW: 13.8 % (ref 11.5–14.5)

## 2013-03-08 LAB — COMPREHENSIVE METABOLIC PANEL
Albumin: 2.2 g/dL — ABNORMAL LOW (ref 3.4–5.0)
BUN: 32 mg/dL — ABNORMAL HIGH (ref 7–18)
Chloride: 107 mmol/L (ref 98–107)
Co2: 22 mmol/L (ref 21–32)
Creatinine: 1.56 mg/dL — ABNORMAL HIGH (ref 0.60–1.30)
EGFR (Non-African Amer.): 30 — ABNORMAL LOW
Osmolality: 279 (ref 275–301)
SGOT(AST): 66 U/L — ABNORMAL HIGH (ref 15–37)
SGPT (ALT): 20 U/L (ref 12–78)
Sodium: 136 mmol/L (ref 136–145)
Total Protein: 5.4 g/dL — ABNORMAL LOW (ref 6.4–8.2)

## 2013-03-09 ENCOUNTER — Telehealth: Payer: Self-pay | Admitting: Internal Medicine

## 2013-03-09 ENCOUNTER — Telehealth: Payer: Self-pay

## 2013-03-09 DIAGNOSIS — Z5189 Encounter for other specified aftercare: Secondary | ICD-10-CM | POA: Diagnosis not present

## 2013-03-09 DIAGNOSIS — D72829 Elevated white blood cell count, unspecified: Secondary | ICD-10-CM | POA: Diagnosis not present

## 2013-03-09 DIAGNOSIS — G20 Parkinson's disease: Secondary | ICD-10-CM | POA: Diagnosis not present

## 2013-03-09 DIAGNOSIS — N3000 Acute cystitis without hematuria: Secondary | ICD-10-CM | POA: Diagnosis not present

## 2013-03-09 DIAGNOSIS — F4320 Adjustment disorder, unspecified: Secondary | ICD-10-CM | POA: Diagnosis not present

## 2013-03-09 DIAGNOSIS — I5022 Chronic systolic (congestive) heart failure: Secondary | ICD-10-CM | POA: Diagnosis not present

## 2013-03-09 DIAGNOSIS — Z9861 Coronary angioplasty status: Secondary | ICD-10-CM | POA: Diagnosis not present

## 2013-03-09 DIAGNOSIS — I509 Heart failure, unspecified: Secondary | ICD-10-CM | POA: Diagnosis not present

## 2013-03-09 DIAGNOSIS — I059 Rheumatic mitral valve disease, unspecified: Secondary | ICD-10-CM

## 2013-03-09 DIAGNOSIS — R110 Nausea: Secondary | ICD-10-CM | POA: Diagnosis not present

## 2013-03-09 DIAGNOSIS — I214 Non-ST elevation (NSTEMI) myocardial infarction: Secondary | ICD-10-CM | POA: Diagnosis not present

## 2013-03-09 DIAGNOSIS — I4891 Unspecified atrial fibrillation: Secondary | ICD-10-CM | POA: Diagnosis not present

## 2013-03-09 DIAGNOSIS — I255 Ischemic cardiomyopathy: Secondary | ICD-10-CM

## 2013-03-09 DIAGNOSIS — I2510 Atherosclerotic heart disease of native coronary artery without angina pectoris: Secondary | ICD-10-CM | POA: Diagnosis not present

## 2013-03-09 DIAGNOSIS — I2589 Other forms of chronic ischemic heart disease: Secondary | ICD-10-CM | POA: Diagnosis not present

## 2013-03-09 DIAGNOSIS — E539 Vitamin B deficiency, unspecified: Secondary | ICD-10-CM | POA: Diagnosis not present

## 2013-03-09 DIAGNOSIS — I252 Old myocardial infarction: Secondary | ICD-10-CM | POA: Diagnosis not present

## 2013-03-09 DIAGNOSIS — Z8249 Family history of ischemic heart disease and other diseases of the circulatory system: Secondary | ICD-10-CM | POA: Diagnosis not present

## 2013-03-09 DIAGNOSIS — R0602 Shortness of breath: Secondary | ICD-10-CM | POA: Diagnosis not present

## 2013-03-09 DIAGNOSIS — R000 Tachycardia, unspecified: Secondary | ICD-10-CM | POA: Diagnosis not present

## 2013-03-09 DIAGNOSIS — Z7902 Long term (current) use of antithrombotics/antiplatelets: Secondary | ICD-10-CM | POA: Diagnosis not present

## 2013-03-09 DIAGNOSIS — Z7982 Long term (current) use of aspirin: Secondary | ICD-10-CM | POA: Diagnosis not present

## 2013-03-09 DIAGNOSIS — I10 Essential (primary) hypertension: Secondary | ICD-10-CM | POA: Diagnosis not present

## 2013-03-09 DIAGNOSIS — I5023 Acute on chronic systolic (congestive) heart failure: Secondary | ICD-10-CM | POA: Diagnosis not present

## 2013-03-09 DIAGNOSIS — Z66 Do not resuscitate: Secondary | ICD-10-CM | POA: Diagnosis not present

## 2013-03-09 DIAGNOSIS — N289 Disorder of kidney and ureter, unspecified: Secondary | ICD-10-CM | POA: Diagnosis not present

## 2013-03-09 DIAGNOSIS — J811 Chronic pulmonary edema: Secondary | ICD-10-CM | POA: Diagnosis not present

## 2013-03-09 DIAGNOSIS — R079 Chest pain, unspecified: Secondary | ICD-10-CM | POA: Diagnosis not present

## 2013-03-09 DIAGNOSIS — N179 Acute kidney failure, unspecified: Secondary | ICD-10-CM | POA: Diagnosis not present

## 2013-03-09 DIAGNOSIS — R52 Pain, unspecified: Secondary | ICD-10-CM | POA: Diagnosis not present

## 2013-03-09 HISTORY — DX: Ischemic cardiomyopathy: I25.5

## 2013-03-09 LAB — BASIC METABOLIC PANEL
Anion Gap: 10 (ref 7–16)
BUN: 30 mg/dL — ABNORMAL HIGH (ref 7–18)
Calcium, Total: 7.6 mg/dL — ABNORMAL LOW (ref 8.5–10.1)
Chloride: 108 mmol/L — ABNORMAL HIGH (ref 98–107)
Co2: 21 mmol/L (ref 21–32)
Creatinine: 1.56 mg/dL — ABNORMAL HIGH (ref 0.60–1.30)
EGFR (African American): 35 — ABNORMAL LOW
Potassium: 3.5 mmol/L (ref 3.5–5.1)
Sodium: 139 mmol/L (ref 136–145)

## 2013-03-09 NOTE — Telephone Encounter (Signed)
Patient has a hospital follow up from [facility] scheduled on 5.30.14.

## 2013-03-09 NOTE — Telephone Encounter (Signed)
tcm

## 2013-03-09 NOTE — Telephone Encounter (Signed)
Message copied by Marcelle Overlie on Mon Mar 09, 2013  4:26 PM ------      Message from: Coralee Rud      Created: Mon Mar 09, 2013  3:56 PM      Regarding: tcm/ph       appt 03/17/13 at 11:30 with DR Mariah Milling ------

## 2013-03-09 NOTE — Telephone Encounter (Signed)
Fwd to Dr. Walker 

## 2013-03-09 NOTE — Telephone Encounter (Signed)
Can you call her and find out how she is doing and request the discharge summary?

## 2013-03-10 ENCOUNTER — Encounter: Payer: Self-pay | Admitting: *Deleted

## 2013-03-10 ENCOUNTER — Telehealth: Payer: Self-pay

## 2013-03-10 DIAGNOSIS — R110 Nausea: Secondary | ICD-10-CM | POA: Diagnosis not present

## 2013-03-10 DIAGNOSIS — I2510 Atherosclerotic heart disease of native coronary artery without angina pectoris: Secondary | ICD-10-CM | POA: Diagnosis not present

## 2013-03-10 MED ORDER — PANTOPRAZOLE SODIUM 40 MG PO TBEC: 40.0000 mg | DELAYED_RELEASE_TABLET | Freq: Two times a day (BID) | ORAL | Status: DC

## 2013-03-10 NOTE — Telephone Encounter (Signed)
No answer or voicemail to leave message, faxed request for notes today.

## 2013-03-10 NOTE — Telephone Encounter (Signed)
Patient contacted regarding discharge from [facility] on 03/06/13.  Patient understands to follow up with provider PA on 03/17/13 at 1130 at [place] office. Patient understands discharge instructions? yes Patient understands medications and regiment? Yes, managed by Skilled Nursing Facility Patient understands to bring all medications to this visit? Yes, from SNF  I spoke with pt's dtr, Corrie Dandy.  She lives in [place] but is down to help pt Pt is currently residing in a SNF apartment at [facility]  Medications being administered by staff  Dtr concerned b/c pt having "severe indigestion" since d/c. Associated with "gagging". Not on any meds for this Says she has dealt with indigestion for "years" Main symptom prior to MI was indigestion and vomiting Denies vomiting since d/c Says pt c/o chest "heaviness" yesterday Able to ambulate with walker and perform ADLs independently Also has some DOE   Not due to see [place] until 5/20. i told her I would make PA aware of symptoms and see if we need to prescribe med for indigestion/see if pt needs to be seen sooner, etc and will call dtr back at [phone] Understanding verb

## 2013-03-10 NOTE — Telephone Encounter (Signed)
Pt's dtr, mary, called back says pt "is not doing well at all", says her indigestion is bad and she is gagging with the slightest ingestion of water.  Denies chest pain or heaviness  I had her hold while I discussed with Dr. Kirke Corin  "Start Protonix 40 mg PO BID. Have pt go to ER if symptoms are similar to previous MI" V.O Dr. Adline Mango, RN  Corrie Dandy, dtr, informed Understanding verb New RX called in to nurse at [facility] I also told her to have pt go to ER if symptoms get worse/become similar to pre MI symptoms.  She verb understanding

## 2013-03-10 NOTE — Telephone Encounter (Signed)
Please see below and advise. thanks 

## 2013-03-11 ENCOUNTER — Telehealth: Payer: Self-pay | Admitting: Internal Medicine

## 2013-03-11 ENCOUNTER — Observation Stay: Payer: Self-pay | Admitting: Internal Medicine

## 2013-03-11 DIAGNOSIS — I2589 Other forms of chronic ischemic heart disease: Secondary | ICD-10-CM | POA: Diagnosis not present

## 2013-03-11 DIAGNOSIS — Z66 Do not resuscitate: Secondary | ICD-10-CM | POA: Diagnosis not present

## 2013-03-11 DIAGNOSIS — I509 Heart failure, unspecified: Secondary | ICD-10-CM | POA: Diagnosis not present

## 2013-03-11 DIAGNOSIS — I5022 Chronic systolic (congestive) heart failure: Secondary | ICD-10-CM | POA: Diagnosis not present

## 2013-03-11 DIAGNOSIS — I4891 Unspecified atrial fibrillation: Secondary | ICD-10-CM | POA: Diagnosis not present

## 2013-03-11 DIAGNOSIS — G20 Parkinson's disease: Secondary | ICD-10-CM | POA: Diagnosis not present

## 2013-03-11 DIAGNOSIS — I5023 Acute on chronic systolic (congestive) heart failure: Secondary | ICD-10-CM | POA: Diagnosis not present

## 2013-03-11 DIAGNOSIS — N179 Acute kidney failure, unspecified: Secondary | ICD-10-CM | POA: Diagnosis not present

## 2013-03-11 DIAGNOSIS — N3000 Acute cystitis without hematuria: Secondary | ICD-10-CM | POA: Diagnosis not present

## 2013-03-11 DIAGNOSIS — R52 Pain, unspecified: Secondary | ICD-10-CM | POA: Diagnosis not present

## 2013-03-11 DIAGNOSIS — R0602 Shortness of breath: Secondary | ICD-10-CM | POA: Diagnosis not present

## 2013-03-11 DIAGNOSIS — J811 Chronic pulmonary edema: Secondary | ICD-10-CM | POA: Diagnosis not present

## 2013-03-11 DIAGNOSIS — R079 Chest pain, unspecified: Secondary | ICD-10-CM | POA: Diagnosis not present

## 2013-03-11 LAB — CBC
HGB: 11.4 g/dL — ABNORMAL LOW (ref 12.0–16.0)
MCH: 29.4 pg (ref 26.0–34.0)
RBC: 3.86 10*6/uL (ref 3.80–5.20)
RDW: 13.1 % (ref 11.5–14.5)
WBC: 12.9 10*3/uL — ABNORMAL HIGH (ref 3.6–11.0)

## 2013-03-11 LAB — TROPONIN I: Troponin-I: 1.3 ng/mL — ABNORMAL HIGH

## 2013-03-11 LAB — COMPREHENSIVE METABOLIC PANEL
Anion Gap: 9 (ref 7–16)
BUN: 27 mg/dL — ABNORMAL HIGH (ref 7–18)
Calcium, Total: 8 mg/dL — ABNORMAL LOW (ref 8.5–10.1)
Creatinine: 1.61 mg/dL — ABNORMAL HIGH (ref 0.60–1.30)
EGFR (African American): 33 — ABNORMAL LOW
Osmolality: 277 (ref 275–301)
SGOT(AST): 42 U/L — ABNORMAL HIGH (ref 15–37)
SGPT (ALT): 20 U/L (ref 12–78)

## 2013-03-11 LAB — CK TOTAL AND CKMB (NOT AT ARMC)
CK, Total: 75 U/L (ref 21–215)
CK-MB: 2.1 ng/mL (ref 0.5–3.6)
CK-MB: 1.8 ng/mL (ref 0.5–3.6)

## 2013-03-11 NOTE — Telephone Encounter (Signed)
I see PPI was Rxed yesterday, please see if this has helped. I can see her tomorrow for closer follow-up. Please make sure she is taking ASA/Brilinta and Lasix as prescribed. Also ask about daily weights, salt and fluid intake. If chest heaviness and DOE have worsened may need to see her in for EKG today.

## 2013-03-11 NOTE — Telephone Encounter (Signed)
Please read below, faxed [facility] for D/C summary yesterday and it was not ready.

## 2013-03-11 NOTE — Telephone Encounter (Signed)
Lynden Ang called to let you know that Ms Linda Hart had a heart attack 03/05/13 went to [facility] discharged 03/09/13 hospital follow 5/30 Dr Mariah Milling was her cardologist  Pt is in skilled nursing at [facility]

## 2013-03-11 NOTE — Telephone Encounter (Signed)
Received a request from [facility] the D/C was not ready yet.

## 2013-03-11 NOTE — Telephone Encounter (Signed)
We will need to try to move her follow up sooner into any slot

## 2013-03-12 DIAGNOSIS — N3000 Acute cystitis without hematuria: Secondary | ICD-10-CM | POA: Diagnosis not present

## 2013-03-12 DIAGNOSIS — R0602 Shortness of breath: Secondary | ICD-10-CM | POA: Diagnosis not present

## 2013-03-12 DIAGNOSIS — I5022 Chronic systolic (congestive) heart failure: Secondary | ICD-10-CM | POA: Diagnosis not present

## 2013-03-12 DIAGNOSIS — I2510 Atherosclerotic heart disease of native coronary artery without angina pectoris: Secondary | ICD-10-CM

## 2013-03-12 DIAGNOSIS — N179 Acute kidney failure, unspecified: Secondary | ICD-10-CM | POA: Diagnosis not present

## 2013-03-12 LAB — BASIC METABOLIC PANEL
BUN: 27 mg/dL — ABNORMAL HIGH (ref 7–18)
Calcium, Total: 8.3 mg/dL — ABNORMAL LOW (ref 8.5–10.1)
Co2: 23 mmol/L (ref 21–32)
EGFR (African American): 36 — ABNORMAL LOW
EGFR (Non-African Amer.): 31 — ABNORMAL LOW
Potassium: 3.6 mmol/L (ref 3.5–5.1)

## 2013-03-12 LAB — CBC WITH DIFFERENTIAL/PLATELET
Basophil #: 0.1 10*3/uL (ref 0.0–0.1)
Eosinophil #: 0.3 10*3/uL (ref 0.0–0.7)
Eosinophil %: 2.2 %
HGB: 12 g/dL (ref 12.0–16.0)
Lymphocyte %: 11.1 %
MCHC: 33.6 g/dL (ref 32.0–36.0)
Monocyte #: 1.5 x10 3/mm — ABNORMAL HIGH (ref 0.2–0.9)
Monocyte %: 12.7 %
Neutrophil #: 8.9 10*3/uL — ABNORMAL HIGH (ref 1.4–6.5)
Neutrophil %: 73.4 %
Platelet: 334 10*3/uL (ref 150–440)
RBC: 4.04 10*6/uL (ref 3.80–5.20)
RDW: 12.9 % (ref 11.5–14.5)
WBC: 12.1 10*3/uL — ABNORMAL HIGH (ref 3.6–11.0)

## 2013-03-12 LAB — CK TOTAL AND CKMB (NOT AT ARMC)
CK, Total: 78 U/L (ref 21–215)
CK-MB: 1.4 ng/mL (ref 0.5–3.6)

## 2013-03-12 LAB — TROPONIN I: Troponin-I: 1.1 ng/mL — ABNORMAL HIGH

## 2013-03-12 NOTE — Telephone Encounter (Signed)
Theola Sequin, CMA at 03/10/2013 11:58 AM   Status: Signed            No answer or voicemail to leave message, faxed request for notes today.        Wynona Dove, MD at 03/09/2013 5:04 PM    Status: Signed             Can you call her and find out how she is doing and request the discharge summary?        Theola Sequin, CMA at 03/09/2013 4:39 PM    Status: Signed             Fwd to Dr. Clyde Lundborg at 03/09/2013 3:54 PM    Status: Signed             Patient has a hospital follow up from [facility] scheduled on 5.30.14.

## 2013-03-12 NOTE — Telephone Encounter (Signed)
Duplicate message, please refer to next phone encounter for more information

## 2013-03-16 ENCOUNTER — Encounter: Payer: Self-pay | Admitting: Internal Medicine

## 2013-03-16 LAB — CBC WITH DIFFERENTIAL/PLATELET
Basophil #: 0.1 10*3/uL (ref 0.0–0.1)
Eosinophil %: 2.8 %
HCT: 38.9 % (ref 35.0–47.0)
HGB: 13.1 g/dL (ref 12.0–16.0)
Lymphocyte #: 1.7 10*3/uL (ref 1.0–3.6)
MCH: 29.6 pg (ref 26.0–34.0)
MCHC: 33.7 g/dL (ref 32.0–36.0)
Monocyte #: 1.3 x10 3/mm — ABNORMAL HIGH (ref 0.2–0.9)
Monocyte %: 11.7 %
Neutrophil #: 7.6 10*3/uL — ABNORMAL HIGH (ref 1.4–6.5)
Neutrophil %: 69.6 %
Platelet: 412 10*3/uL (ref 150–440)

## 2013-03-16 LAB — BASIC METABOLIC PANEL
Chloride: 99 mmol/L (ref 98–107)
Co2: 29 mmol/L (ref 21–32)
Creatinine: 2.26 mg/dL — ABNORMAL HIGH (ref 0.60–1.30)
EGFR (African American): 22 — ABNORMAL LOW
EGFR (Non-African Amer.): 19 — ABNORMAL LOW
Glucose: 108 mg/dL — ABNORMAL HIGH (ref 65–99)
Potassium: 4.2 mmol/L (ref 3.5–5.1)
Sodium: 136 mmol/L (ref 136–145)

## 2013-03-16 NOTE — Telephone Encounter (Signed)
Patient is back in the hospital

## 2013-03-17 ENCOUNTER — Ambulatory Visit (INDEPENDENT_AMBULATORY_CARE_PROVIDER_SITE_OTHER): Payer: Medicare Other | Admitting: Physician Assistant

## 2013-03-17 ENCOUNTER — Encounter: Payer: Self-pay | Admitting: Physician Assistant

## 2013-03-17 VITALS — BP 121/82 | HR 92 | Ht 61.0 in | Wt 105.8 lb

## 2013-03-17 DIAGNOSIS — I2589 Other forms of chronic ischemic heart disease: Secondary | ICD-10-CM

## 2013-03-17 DIAGNOSIS — N289 Disorder of kidney and ureter, unspecified: Secondary | ICD-10-CM

## 2013-03-17 DIAGNOSIS — I2510 Atherosclerotic heart disease of native coronary artery without angina pectoris: Secondary | ICD-10-CM | POA: Insufficient documentation

## 2013-03-17 DIAGNOSIS — E785 Hyperlipidemia, unspecified: Secondary | ICD-10-CM | POA: Insufficient documentation

## 2013-03-17 DIAGNOSIS — I10 Essential (primary) hypertension: Secondary | ICD-10-CM

## 2013-03-17 DIAGNOSIS — R000 Tachycardia, unspecified: Secondary | ICD-10-CM | POA: Diagnosis not present

## 2013-03-17 DIAGNOSIS — I480 Paroxysmal atrial fibrillation: Secondary | ICD-10-CM

## 2013-03-17 DIAGNOSIS — I4891 Unspecified atrial fibrillation: Secondary | ICD-10-CM

## 2013-03-17 DIAGNOSIS — I255 Ischemic cardiomyopathy: Secondary | ICD-10-CM | POA: Insufficient documentation

## 2013-03-17 DIAGNOSIS — R0602 Shortness of breath: Secondary | ICD-10-CM | POA: Diagnosis not present

## 2013-03-17 NOTE — Patient Instructions (Addendum)
Please hold Lasix and potassium until 03/20/13. Hydrate gently during that time. Then restart Lasix at 40mg  by mouth (one tablet) daily, and potassium one tablet daily at that time.   We will check repeat labwork to assess your kidney function on Friday.   Please resume follow-up with Dr. Dan Humphreys next week.   We would like you to see the nutritionist at your facility for further recommendations regarding poor appetite.   Continue all other medications as prescribed.  We will see you back in 2 weeks.

## 2013-03-17 NOTE — Assessment & Plan Note (Addendum)
NSR in the office today. Transient paroxysm in the setting of NSTEMI and reperfusion. No further episodes. CHADSVASc at least 4. Has not been started on anticoagulation due to concern for increased risk of bleeding on triple therapy.

## 2013-03-17 NOTE — Assessment & Plan Note (Signed)
As mentioned, suspect over-diuresis with current Lasix regimen supported by jump in creatinine on lab work yesterday. She denies SOB/DOE, PND, orthopnea, swelling or chest pain, but does endorse symptoms consistent with dehydration. Will hold Lasix until Friday 03/20/13. Encourage gentle hydration. Then restart at 40mg  PO daily with corresponding KCl supplementation. Cannot add ACEi/ARB/ald ant with renal dysfunction. Hesitant to increase carvedilol with dehydration. Will revisit on follow-up in 2 weeks. Has declined LifeVest. Repeat echo in 3 months to reassess EF.

## 2013-03-17 NOTE — Assessment & Plan Note (Signed)
No further chest pain. Tolerating rehab well despite dehydration and decreased PO intake. Continue low-dose ASA, Brilinta, BB, statin, NTG SL PRN.

## 2013-03-17 NOTE — Progress Notes (Signed)
Patient ID: Linda Hart, female   DOB: 01/13/1925, 79 y.o.   MRN: [id number]            Date:  03/17/2013   ID:  Linda Hart, DOB Jun 03, 1925, MRN [id number]  PCP:  Ronna Polio, MD  Primary Cardiologist:  New- PCI performed by Dr. Kirke Corin   History of Present Illness: Linda Hart is a 79 y.o. female Linda Hart is a very pleasant 79 year old woman with PMHx s/f CAD (NSTEMI 03/06/13 s/p DES-LAD), PAF (in setting of NSTEMI and reperfusion), ischemic cardiomyopathy (EF 30-35% on 03/09/13 echo), SVT (self-limited on OP monitor), GERD, Parkinson's disease (questionable), remote h/o lightheadedness and syncope who was re-admitted to [facility] with worsening DOE/SOB.   She was admitted 03/05/13 with NSTEMI. Prior to cath, she did have a hypertensive episode with acute systolic CHF responsive to Lasix, O2, morphine and NTG. She underwent cardiac catheterization revealing tubular 90% prox extending to mid LAD stenosis s/p DES, mild atheresclerosis in LCx, RCA. Post-cath echo showed EF 30-35%, severe anterior, septal and apical HK, mild TR/MR, mildly elevated EF described as reduced. DAPT- ASA/Brilinta x 12 months recommended. She recovered post-cath. Cr did bump to 1.5-1.6 suspected to be secondary to contrast induced precluding the addition of ACEi/ARB. Limited repeat echo indicated LVEF 30-35%. She declined LifeVest. She was discharged on ASA, Brilinta, BB, statin, NTG SL PRN to an acute rehab facility. Close follow-up was scheduled.   She called the office c/o chest discomfort with meals. PPI was prescribed which improved the pain, however she became acutely short of breath on 02/09/13 and was re-admitted for acute on chronic systolic CHF. She improved quickly with gentle diuresis. Troponin did return mildly elevated, but was down-trending and attributed to NSTEMI the week prior. She was discharged on 02/10/13 back to her facility. Discharge medications as below. PPI added for GERD. She presents today for follow-up.      She has been working with rehab and tolerating well. She denies chest pain, LE edema, lightheadedness, syncope or worsening DOE/SOB. No urinary changes. She does note increased thirst, weakness, decreased appetite and fatigue. She had labwork checked yesterday as arranged at discharge revealing BUN 45/Cr 2.26. CBC unremarkable. She continues to take all medications as prescribed including Lasix 40mg  PO BID.  EKG: NSR, 92 bpm, TWIs V2-V4, I, aVL, II, III, aVF (unchanged from prior tracings). New inverted P waves II, III, aVF (lead placement?)  Wt Readings from Last 3 Encounters:  03/17/13 105 lb 12 oz (47.968 kg)  06/16/12 118 lb 8 oz (53.751 kg)  05/08/12 119 lb 8 oz (54.205 kg)     Past Medical History  Diagnosis Date  . Dizziness   . Hypotension   . Coronary artery disease 03/06/13    90% prox LAD stenosis s/p DES  . Ischemic cardiomyopathy 03/09/13    s/p NSTEMI. EF 30-35%.  Marland Kitchen GERD (gastroesophageal reflux disease)   . Parkinson's disease     Questionable. Patient denies.   Marland Kitchen PAF (paroxysmal atrial fibrillation) 03/06/13    In setting of NSTEMI and reperfusion, no recurrence  . SVT (supraventricular tachycardia)     Self-limited on outpatient cardiac monitor    Current Outpatient Prescriptions  Medication Sig Dispense Refill  . aspirin 81 MG tablet Take 81 mg by mouth daily.        Marland Kitchen atorvastatin (LIPITOR) 20 MG tablet Take 20 mg by mouth daily.      Marland Kitchen BRILINTA 90 MG TABS tablet 90 mg 2 (two) times daily.       Marland Kitchen  carvedilol (COREG) 3.125 MG tablet Take 3.125 mg by mouth 2 (two) times daily with a meal.      . cyanocobalamin (,VITAMIN B-12,) 1000 MCG/ML injection INJECT 1 ML IM EACH MONTH  10 mL  3  . Cyanocobalamin (VITAMIN B-12 IJ) Inject as directed every 30 (thirty) days.      . furosemide (LASIX) 40 MG tablet 40 mg 2 (two) times daily.       Marland Kitchen KLOR-CON M20 20 MEQ tablet 20 mEq 2 (two) times daily.       . nitroGLYCERIN (NITROSTAT) 0.4 MG SL tablet Place 0.4 mg under the  tongue every 5 (five) minutes as needed for chest pain.      . pantoprazole (PROTONIX) 40 MG tablet Take 1 tablet (40 mg total) by mouth 2 (two) times daily.  60 tablet  11  . promethazine (PHENERGAN) 25 MG tablet Take 25 mg by mouth as needed for nausea.       No current facility-administered medications for this visit.    Allergies:    Allergies  Allergen Reactions  . Penicillins     Rash/hives/swelling    Social History:  The patient  reports that she has never smoked. She has never used smokeless tobacco. She reports that she does not drink alcohol or use illicit drugs.   ROS:  Please see the history of present illness. All other systems reviewed and negative.   PHYSICAL EXAM: VS:  BP 121/82  Pulse 92  Ht 5\' 1"  (1.549 m)  Wt 105 lb 12 oz (47.968 kg)  BMI 19.99 kg/m2 Elderly, thin appearing female seated in a wheelchair, in no acute distress HEENT: normal Neck: no JVD Cardiac:  normal S1, S2; RRR; no murmur Lungs:  clear to auscultation bilaterally, no wheezing, rhonchi or rales Abd: soft, nontender, no hepatomegaly Ext: no edema Skin: warm and dry Neuro:  CNs 2-12 intact, no focal abnormalities noted

## 2013-03-17 NOTE — Assessment & Plan Note (Signed)
Well controlled today. Continue carvedilol.

## 2013-03-17 NOTE — Assessment & Plan Note (Addendum)
Creatinine range on prior admission 1.5-1.6. Cr on BMET yesterday returned elevated at 2.26 indicating acute renal failure. Suspect over-diuresis with current Lasix regimen. She endorses increased thirst, fatigue, weakness and decreased appetite. No urinary changes. No lightheadedness or dizziness. BP normotensive in the office today. Will plan to hold Lasix until Friday 03/20/13, then reduce dose to 40mg  PO daily. Align potassium supplementation with this regimen. Will recheck BMET on Friday to reassess kidney function. Advised to gently hydrate.

## 2013-03-17 NOTE — Assessment & Plan Note (Signed)
LDL 52 on 03/06/13. Continue statin. Check LFTs and lipid panel in 4 weeks (6 weeks since starting on statin).

## 2013-03-20 ENCOUNTER — Encounter: Payer: Self-pay | Admitting: Internal Medicine

## 2013-03-20 LAB — BASIC METABOLIC PANEL
Anion Gap: 8 (ref 7–16)
BUN: 58 mg/dL — ABNORMAL HIGH (ref 7–18)
Calcium, Total: 9.9 mg/dL (ref 8.5–10.1)
Chloride: 102 mmol/L (ref 98–107)
Co2: 28 mmol/L (ref 21–32)
EGFR (Non-African Amer.): 18 — ABNORMAL LOW
Glucose: 80 mg/dL (ref 65–99)
Osmolality: 291 (ref 275–301)
Potassium: 3.5 mmol/L (ref 3.5–5.1)
Sodium: 138 mmol/L (ref 136–145)

## 2013-03-27 ENCOUNTER — Encounter: Payer: Self-pay | Admitting: Internal Medicine

## 2013-03-27 ENCOUNTER — Ambulatory Visit (INDEPENDENT_AMBULATORY_CARE_PROVIDER_SITE_OTHER): Payer: Medicare Other | Admitting: Internal Medicine

## 2013-03-27 VITALS — BP 100/76 | HR 81 | Wt 104.0 lb

## 2013-03-27 DIAGNOSIS — N179 Acute kidney failure, unspecified: Secondary | ICD-10-CM

## 2013-03-27 DIAGNOSIS — I2510 Atherosclerotic heart disease of native coronary artery without angina pectoris: Secondary | ICD-10-CM

## 2013-03-27 DIAGNOSIS — N289 Disorder of kidney and ureter, unspecified: Secondary | ICD-10-CM

## 2013-03-27 DIAGNOSIS — I255 Ischemic cardiomyopathy: Secondary | ICD-10-CM

## 2013-03-27 DIAGNOSIS — I2589 Other forms of chronic ischemic heart disease: Secondary | ICD-10-CM

## 2013-03-27 LAB — CBC WITH DIFFERENTIAL/PLATELET
Basophils Relative: 0.5 % (ref 0.0–3.0)
Eosinophils Relative: 4.7 % (ref 0.0–5.0)
HCT: 38.1 % (ref 36.0–46.0)
Hemoglobin: 12.8 g/dL (ref 12.0–15.0)
Lymphs Abs: 1.7 10*3/uL (ref 0.7–4.0)
MCV: 89.7 fl (ref 78.0–100.0)
Monocytes Absolute: 1.1 10*3/uL — ABNORMAL HIGH (ref 0.1–1.0)
Monocytes Relative: 9 % (ref 3.0–12.0)
Neutro Abs: 8.5 10*3/uL — ABNORMAL HIGH (ref 1.4–7.7)
RBC: 4.25 Mil/uL (ref 3.87–5.11)
WBC: 12 10*3/uL — ABNORMAL HIGH (ref 4.5–10.5)

## 2013-03-27 LAB — COMPREHENSIVE METABOLIC PANEL
Alkaline Phosphatase: 84 U/L (ref 39–117)
CO2: 27 mEq/L (ref 19–32)
Creatinine, Ser: 2.2 mg/dL — ABNORMAL HIGH (ref 0.4–1.2)
GFR: 22.06 mL/min — ABNORMAL LOW (ref >60.00)
Glucose, Bld: 185 mg/dL — ABNORMAL HIGH (ref 70–99)
Sodium: 138 mEq/L (ref 135–145)
Total Bilirubin: 1.4 mg/dL — ABNORMAL HIGH (ref 0.3–1.2)
Total Protein: 7.2 g/dL (ref 6.0–8.3)

## 2013-03-27 NOTE — Assessment & Plan Note (Signed)
BUN/Cr remain elevated. Pt appears volume depleted on exam. Suspect over-diuresis. Recommend holding lasix 1-2 days. Monitor weight closely. Plan to repeat renal function at follow up visit next week.

## 2013-03-27 NOTE — Assessment & Plan Note (Signed)
Patient appears to be volume depleted on exam. BUN and creatinine continue to be elevated. She has been taking Lasix 40 mg daily. Would recommend holding lasix for 1-2 days and following weight closely. Message sent to pt cardiologist to make sure he agrees with plan. Pt has follow up with cardiology next week.

## 2013-03-27 NOTE — Assessment & Plan Note (Signed)
Symptomatically doing well with no further chest pain. Tolerating PT.  Continue current medications including aspirin, Brilinta, carvedilol, and atorvastatin. Follow up with cardiology next week.

## 2013-03-27 NOTE — Progress Notes (Signed)
Subjective:    Patient ID: Linda Hart, female    DOB: 1925/04/06, 79 y.o.   MRN: [id number]  HPI 79 year old female presents for followup after recent hospitalization for non-ST elevation MI status post stent placement to the mid LAD complicated by ischemic cardiomyopathy and atrial fibrillation. Patient reports she is feeling better. She continues to have some fatigue but denies shortness of breath or chest pain. She denies palpitations. She complains of extremely dry mucous membranes. She was seen by cardiology last week and noted to have acute elevation of BUN and creatinine. Lasix dosing was decreased from 40 mg twice daily to 40 mg daily.  Patient's daughter reports that patient is tolerating physical therapy well. She is currently living in a skilled nursing facility.  Outpatient Encounter Prescriptions as of 03/27/2013  Medication Sig Dispense Refill  . aspirin 81 MG tablet Take 81 mg by mouth daily.        Marland Kitchen atorvastatin (LIPITOR) 20 MG tablet Take 20 mg by mouth daily.      Marland Kitchen BRILINTA 90 MG TABS tablet 90 mg 2 (two) times daily.       . carvedilol (COREG) 3.125 MG tablet Take 3.125 mg by mouth 2 (two) times daily with a meal.      . cyanocobalamin (,VITAMIN B-12,) 1000 MCG/ML injection INJECT 1 ML IM EACH MONTH  10 mL  3  . Cyanocobalamin (VITAMIN B-12 IJ) Inject as directed every 30 (thirty) days.      . furosemide (LASIX) 40 MG tablet 40 mg daily.       Marland Kitchen KLOR-CON M20 20 MEQ tablet 20 mEq daily.       Marland Kitchen levalbuterol (XOPENEX HFA) 45 MCG/ACT inhaler Inhale 1-2 puffs into the lungs every 4 (four) hours as needed for wheezing.      . pantoprazole (PROTONIX) 40 MG tablet Take 1 tablet (40 mg total) by mouth 2 (two) times daily.  60 tablet  11  . promethazine (PHENERGAN) 25 MG tablet Take 25 mg by mouth as needed for nausea.      . nitroGLYCERIN (NITROSTAT) 0.4 MG SL tablet Place 0.4 mg under the tongue every 5 (five) minutes as needed for chest pain.       No facility-administered  encounter medications on file as of 03/27/2013.   BP 100/76  Pulse 81  Wt 104 lb (47.174 kg)  BMI 19.66 kg/m2  SpO2 98%  Review of Systems  Constitutional: Positive for fatigue. Negative for fever, chills, appetite change and unexpected weight change.  HENT: Negative for ear pain, congestion, sore throat, trouble swallowing, neck pain, voice change and sinus pressure.   Eyes: Negative for visual disturbance.  Respiratory: Negative for cough, shortness of breath, wheezing and stridor.   Cardiovascular: Negative for chest pain, palpitations and leg swelling.  Gastrointestinal: Negative for nausea, vomiting, abdominal pain, diarrhea, constipation, blood in stool, abdominal distention and anal bleeding.  Genitourinary: Negative for dysuria and flank pain.  Musculoskeletal: Negative for myalgias, arthralgias and gait problem.  Skin: Negative for color change and rash.  Neurological: Negative for dizziness and headaches.  Hematological: Negative for adenopathy. Does not bruise/bleed easily.  Psychiatric/Behavioral: Negative for suicidal ideas, sleep disturbance and dysphoric mood. The patient is not nervous/anxious.        Objective:   Physical Exam  Constitutional: She is oriented to person, place, and time. She appears well-developed and well-nourished. No distress.  HENT:  Head: Normocephalic and atraumatic.  Right Ear: External ear normal.  Left Ear: External ear  normal.  Nose: Nose normal.  Mouth/Throat: Oropharynx is clear and moist. Mucous membranes are dry. No oropharyngeal exudate.  Eyes: Conjunctivae are normal. Pupils are equal, round, and reactive to light. Right eye exhibits no discharge. Left eye exhibits no discharge. No scleral icterus.  Neck: Normal range of motion. Neck supple. No tracheal deviation present. No thyromegaly present.  Cardiovascular: Normal rate, regular rhythm, normal heart sounds and intact distal pulses.  Exam reveals no gallop and no friction rub.    No murmur heard. Pulmonary/Chest: Effort normal and breath sounds normal. No accessory muscle usage. Not tachypneic. No respiratory distress. She has no decreased breath sounds. She has no wheezes. She has no rhonchi. She has no rales. She exhibits no tenderness.  Musculoskeletal: Normal range of motion. She exhibits no edema and no tenderness.  Lymphadenopathy:    She has no cervical adenopathy.  Neurological: She is alert and oriented to person, place, and time. No cranial nerve deficit. She exhibits normal muscle tone. Coordination normal.  Skin: Skin is warm and dry. No rash noted. She is not diaphoretic. No erythema. No pallor.  Psychiatric: She has a normal mood and affect. Her behavior is normal. Judgment and thought content normal.          Assessment & Plan:

## 2013-03-29 ENCOUNTER — Encounter: Payer: Self-pay | Admitting: Internal Medicine

## 2013-04-03 ENCOUNTER — Telehealth: Payer: Self-pay

## 2013-04-03 ENCOUNTER — Other Ambulatory Visit: Payer: Self-pay

## 2013-04-03 ENCOUNTER — Encounter: Payer: Self-pay | Admitting: Cardiovascular Disease

## 2013-04-03 ENCOUNTER — Ambulatory Visit (INDEPENDENT_AMBULATORY_CARE_PROVIDER_SITE_OTHER): Payer: Medicare Other | Admitting: Cardiovascular Disease

## 2013-04-03 VITALS — BP 110/66 | HR 86 | Ht 61.0 in | Wt 105.8 lb

## 2013-04-03 DIAGNOSIS — I2589 Other forms of chronic ischemic heart disease: Secondary | ICD-10-CM

## 2013-04-03 DIAGNOSIS — N289 Disorder of kidney and ureter, unspecified: Secondary | ICD-10-CM

## 2013-04-03 DIAGNOSIS — I2510 Atherosclerotic heart disease of native coronary artery without angina pectoris: Secondary | ICD-10-CM

## 2013-04-03 DIAGNOSIS — I480 Paroxysmal atrial fibrillation: Secondary | ICD-10-CM

## 2013-04-03 DIAGNOSIS — I10 Essential (primary) hypertension: Secondary | ICD-10-CM

## 2013-04-03 DIAGNOSIS — I255 Ischemic cardiomyopathy: Secondary | ICD-10-CM

## 2013-04-03 DIAGNOSIS — E785 Hyperlipidemia, unspecified: Secondary | ICD-10-CM

## 2013-04-03 DIAGNOSIS — R002 Palpitations: Secondary | ICD-10-CM

## 2013-04-03 DIAGNOSIS — I4891 Unspecified atrial fibrillation: Secondary | ICD-10-CM

## 2013-04-03 NOTE — Assessment & Plan Note (Signed)
Frequent PVCs and APCs on EKG. History of short runs of SVT. Continue beta blockers

## 2013-04-03 NOTE — Assessment & Plan Note (Signed)
Blood pressure is well controlled on today's visit. No changes made to the medications. Blood pressure could be low secondary to dehydration.

## 2013-04-03 NOTE — Progress Notes (Signed)
Patient ID: Linda Hart, female    DOB: May 09, 1925, 79 y.o.   MRN: [id number]  HPI Comments: Linda Hart is a very pleasant 79 year old woman with  PMHx s/f CAD (NSTEMI 03/06/13 s/p DES-LAD), PAF (in setting of NSTEMI and reperfusion), ischemic cardiomyopathy (EF 30-35% on 03/09/13 echo), SVT (self-limited on OP monitor), GERD, remote h/o lightheadedness and syncope who was re-admitted to [facility] with worsening DOE/SOB may 2014. She presents for routine followup from skilled nursing facility.    She was admitted 03/05/13 with NSTEMI.  cardiac catheterization revealing tubular 90% prox extending to mid LAD stenosis s/p DES, mild atheresclerosis in LCx, RCA.  Post-cath echo showed EF 30-35%, severe anterior, septal and apical HK, mild TR/MR, mildly elevated EF described as reduced. DAPT- ASA/Brilinta x 12 months recommended.  Cr did bump to 1.5-1.6 suspected to be secondary to contrast induced precluding the addition of ACEi/ARB.  Limited repeat echo indicated LVEF 30-35%. She declined LifeVest.   Since her discharge from the hospital, routine lab work showed creatinine in the low twos. On her last office visit, Lasix was decreased from 40 mg twice a day down to 40 mg daily.  She presents today, lab work last week showing creatinine 2.2, elevated BUN 45. She's not drinking much at the nursing facility per the daughter. Strength is coming back and she is scheduled to leave the nursing facility in one week.  Previous event monitor showed frequent supraventricular ectopy, rare PVCs.   She had short runs of SVT, the longest was 11 beats. She had 46 short runs. Uncertain if she was symptomatic during these short runs of SVT    She had a stress test in 2007 where she only exercised for 3 minutes, 5 METS, with normal LV systolic function by echocardiogram. Recommendation was for her to increase her exercise.    EKG shows normal sinus rhythm with rate 86 beats per minute, APCs and PVCs noted, T wave abnormality  V3 to V6, 2, 3, aVF   Outpatient Encounter Prescriptions as of 04/03/2013  Medication Sig Dispense Refill  . aspirin 81 MG tablet Take 81 mg by mouth daily.        Marland Kitchen atorvastatin (LIPITOR) 20 MG tablet Take 20 mg by mouth daily.      Marland Kitchen BRILINTA 90 MG TABS tablet 90 mg 2 (two) times daily.       . carvedilol (COREG) 3.125 MG tablet Take 3.125 mg by mouth 2 (two) times daily with a meal.      . cyanocobalamin (,VITAMIN B-12,) 1000 MCG/ML injection INJECT 1 ML IM EACH MONTH  10 mL  3  . Cyanocobalamin (VITAMIN B-12 IJ) Inject as directed every 30 (thirty) days.      . furosemide (LASIX) 40 MG tablet 40 mg daily.       Marland Kitchen KLOR-CON M20 20 MEQ tablet 20 mEq daily.       Marland Kitchen levalbuterol (XOPENEX HFA) 45 MCG/ACT inhaler Inhale 1-2 puffs into the lungs every 4 (four) hours as needed for wheezing.      . nitroGLYCERIN (NITROSTAT) 0.4 MG SL tablet Place 0.4 mg under the tongue every 5 (five) minutes as needed for chest pain.      . pantoprazole (PROTONIX) 40 MG tablet Take 1 tablet (40 mg total) by mouth 2 (two) times daily.  60 tablet  11  . promethazine (PHENERGAN) 25 MG tablet Take 25 mg by mouth as needed for nausea.       No facility-administered encounter medications on  file as of 04/03/2013.   Review of Systems  Constitutional: Negative.   HENT: Negative.   Eyes: Negative.   Respiratory: Negative.   Cardiovascular: Positive for palpitations.  Gastrointestinal: Negative.   Musculoskeletal: Positive for gait problem.  Skin: Negative.   Neurological: Negative.   Psychiatric/Behavioral: Negative.   All other systems reviewed and are negative.    BP 110/66  Pulse 86  Ht 5\' 1"  (1.549 m)  Wt 105 lb 12 oz (47.968 kg)  BMI 19.99 kg/m2  Physical Exam  Nursing note and vitals reviewed. Constitutional: She is oriented to person, place, and time. She appears well-developed and well-nourished.  HENT:  Head: Normocephalic.  Nose: Nose normal.  Mouth/Throat: Oropharynx is clear and moist.   Eyes: Conjunctivae are normal. Pupils are equal, round, and reactive to light.  Neck: Normal range of motion. Neck supple. No JVD present.  Cardiovascular: Normal rate, regular rhythm, S1 normal, S2 normal, normal heart sounds and intact distal pulses.  Exam reveals no gallop and no friction rub.   No murmur heard. Pulmonary/Chest: Effort normal and breath sounds normal. No respiratory distress. She has no wheezes. She has no rales. She exhibits no tenderness.  Abdominal: Soft. Bowel sounds are normal. She exhibits no distension. There is no tenderness.  Musculoskeletal: Normal range of motion. She exhibits no edema and no tenderness.  Lymphadenopathy:    She has no cervical adenopathy.  Neurological: She is alert and oriented to person, place, and time. Coordination normal.  Skin: Skin is warm and dry. No rash noted. No erythema.  Psychiatric: She has a normal mood and affect. Her behavior is normal. Judgment and thought content normal.    Assessment and Plan

## 2013-04-03 NOTE — Telephone Encounter (Signed)
sherri at [employer] [id number] from today [id number] lasix She asks if this is temporary/indef. I explained this is indef She also asks if we should hold KCL as well I advised to hold KCL since holding lasix  i will confirm with Dr. Mariah Milling and call her back if he says diff Understanding verb

## 2013-04-03 NOTE — Telephone Encounter (Signed)
I discussed with Dr. Mariah Milling who advises to "Have pt continue KCL over w/e then discontinue. Last K=3.5". V.O Dr. Alvis Lemmings, RN, BSN

## 2013-04-03 NOTE — Telephone Encounter (Signed)
Has question about holding pt Lasix., also pt potassium. Please call

## 2013-04-03 NOTE — Assessment & Plan Note (Signed)
Recent echocardiogram showing low ejection fraction. We'll continue beta blockers, gentle diuresis for any weight gain

## 2013-04-03 NOTE — Assessment & Plan Note (Signed)
Creatinine remains greater than 2 with elevated BUN. Concerned about dehydration. I suspect she's not drinking much in the nursing facility. Weight is also slowly dropping. I suggested she hold the Lasix for now. Once she is home, we can reevaluate her fluid intake and adjust Lasix accordingly. She will stop her potassium in 2 days' time as her potassium level is 3.5 recently.  I've asked her to monitor her weight daily. We'll probably need Lasix when necessary for weight gain of several pounds.

## 2013-04-03 NOTE — Assessment & Plan Note (Signed)
Currently with no symptoms of angina. No further workup at this time. Continue current medication regimen. 

## 2013-04-03 NOTE — Telephone Encounter (Signed)
Sherry informed Understanding verb

## 2013-04-03 NOTE — Patient Instructions (Addendum)
Please hold the lasix  Daily weights  Please call us if you have new issues that need to be addressed before your next appt.  Your physician wants you to follow-up in: 1 months.

## 2013-04-03 NOTE — Assessment & Plan Note (Signed)
We have suggested she stay on her generic Lipitor. Goal LDL less than 70

## 2013-04-14 DIAGNOSIS — G20 Parkinson's disease: Secondary | ICD-10-CM | POA: Diagnosis not present

## 2013-04-14 DIAGNOSIS — I509 Heart failure, unspecified: Secondary | ICD-10-CM | POA: Diagnosis not present

## 2013-04-14 DIAGNOSIS — IMO0001 Reserved for inherently not codable concepts without codable children: Secondary | ICD-10-CM | POA: Diagnosis not present

## 2013-04-15 DIAGNOSIS — I509 Heart failure, unspecified: Secondary | ICD-10-CM | POA: Diagnosis not present

## 2013-04-15 DIAGNOSIS — IMO0001 Reserved for inherently not codable concepts without codable children: Secondary | ICD-10-CM | POA: Diagnosis not present

## 2013-04-15 DIAGNOSIS — G20 Parkinson's disease: Secondary | ICD-10-CM | POA: Diagnosis not present

## 2013-04-16 DIAGNOSIS — I509 Heart failure, unspecified: Secondary | ICD-10-CM | POA: Diagnosis not present

## 2013-04-16 DIAGNOSIS — G20 Parkinson's disease: Secondary | ICD-10-CM | POA: Diagnosis not present

## 2013-04-16 DIAGNOSIS — IMO0001 Reserved for inherently not codable concepts without codable children: Secondary | ICD-10-CM | POA: Diagnosis not present

## 2013-04-20 ENCOUNTER — Telehealth: Payer: Self-pay

## 2013-04-20 NOTE — Telephone Encounter (Signed)
Pt daughter called, pt is at [facility], nurse called and states pt is retaining fluid, states nurse gave pt "double dose of her water pill", wants to know how they work giving pt water pill as needed. Please advise.

## 2013-04-20 NOTE — Telephone Encounter (Signed)
Pt's dtr called with concerns re:pt Says she was d/c from SNF 1 week ago Is now at home with a [facility] nurse that assists her with daily weights, medications, etc  Says [facility] nurse called dtr Saturday to tell her pt had some LE edema and gave her 1 lasix 40 mg tablet-she is to only take these PRN [facility] nurse called dtr again yesterday to tell her pt still had LE edema and gave her 2 lasix 40 mg tabs dtr was very concerned since Dr. Mariah Milling advised to remain off lasix UNLESS absolutely necessary secondary to elevated creatinine  dtr says pt's weight at d/c from SNF was 105 pounds. Says her baseline is usually 115 pounds but pt did not eat well in SNF. Says now that she is home she is eating much better, cooking for herself, eating ice cream, etc Since she has been weighing herself daily her weight has increased to 110 pounds but dtr thinks this is b/c she is eating more since getting home (versus fluid)  dtr asks for some guidelines [id number] to take PRN lasix I advised to conitinue to only give lasix PRN weight gain of 2-3 pounds over night or 5 pounds in 1 week (based on baseline 113-115 pounds) and to give for symptoms of SOB, orthopnea, etc, not to just go by LE edema I explained some LE edema may not all be r/t fluid retention   says pt is feeling great. Denies sob, orthopnea or worsening edema. Says she went shopping with pt yesterday and she denied DOE.  Eating well.  dtr will call nursing staff and see if they need [place] to fax some guidelines She will call me back with fax # if this is needed

## 2013-04-20 NOTE — Telephone Encounter (Signed)
FYI

## 2013-04-21 ENCOUNTER — Telehealth: Payer: Self-pay

## 2013-04-21 DIAGNOSIS — G20 Parkinson's disease: Secondary | ICD-10-CM | POA: Diagnosis not present

## 2013-04-21 DIAGNOSIS — I509 Heart failure, unspecified: Secondary | ICD-10-CM | POA: Diagnosis not present

## 2013-04-21 DIAGNOSIS — IMO0001 Reserved for inherently not codable concepts without codable children: Secondary | ICD-10-CM | POA: Diagnosis not present

## 2013-04-21 NOTE — Telephone Encounter (Signed)
dtr called back to give me fax number for home health agency Would like me to fax guidelines [id number] to give PRN lasix (see telephone note from 6/23) Will fax to [phone] attention: Lawson Fiscal

## 2013-04-22 NOTE — Telephone Encounter (Signed)
Orders faxed

## 2013-04-22 NOTE — Telephone Encounter (Signed)
Would probably just take lasix as needed rather than strictly for weight. Would not compare weight from nursing home to weight at home, different scales.

## 2013-04-22 NOTE — Telephone Encounter (Signed)
I discussed with Dr. Mariah Milling who suggested: "Baseline weight is 110 pounds.  Have home health check BMP on next visit and fax [place] results.  We will give further recommendations [id number] base don these results. Hold lasix altogether until we get BMP results". V.O.Dr. Alvis Lemmings, RN, BSN  Will fax orders to # provided

## 2013-04-23 DIAGNOSIS — I10 Essential (primary) hypertension: Secondary | ICD-10-CM | POA: Diagnosis not present

## 2013-04-23 DIAGNOSIS — I509 Heart failure, unspecified: Secondary | ICD-10-CM | POA: Diagnosis not present

## 2013-04-23 DIAGNOSIS — G20 Parkinson's disease: Secondary | ICD-10-CM | POA: Diagnosis not present

## 2013-04-23 DIAGNOSIS — IMO0001 Reserved for inherently not codable concepts without codable children: Secondary | ICD-10-CM | POA: Diagnosis not present

## 2013-04-24 ENCOUNTER — Telehealth: Payer: Self-pay

## 2013-04-24 NOTE — Telephone Encounter (Signed)
Get BMP results

## 2013-04-24 NOTE — Telephone Encounter (Signed)
Please see BMP scanned in from [facility] See last telephone note and advise [id number] paremeters

## 2013-04-27 NOTE — Telephone Encounter (Signed)
Creatinine mildly high Would take lasix prn, not scheduled

## 2013-04-28 DIAGNOSIS — I509 Heart failure, unspecified: Secondary | ICD-10-CM | POA: Diagnosis not present

## 2013-04-28 DIAGNOSIS — G20 Parkinson's disease: Secondary | ICD-10-CM | POA: Diagnosis not present

## 2013-04-28 DIAGNOSIS — IMO0001 Reserved for inherently not codable concepts without codable children: Secondary | ICD-10-CM | POA: Diagnosis not present

## 2013-04-28 NOTE — Telephone Encounter (Signed)
Ok to set parameters as follows?Marland Kitchen... "give lasix PRN weight gain of greater than/equal to 2 pounds overnight or 5 pounds in 1 week. Goal weight=110 pounds"?

## 2013-04-29 NOTE — Telephone Encounter (Signed)
Letter faxed to # provided 

## 2013-04-29 NOTE — Telephone Encounter (Signed)
How about take Lasix daily only as needed for weight 4 pounds above baseline, 110

## 2013-05-03 DIAGNOSIS — G20 Parkinson's disease: Secondary | ICD-10-CM | POA: Diagnosis not present

## 2013-05-03 DIAGNOSIS — I509 Heart failure, unspecified: Secondary | ICD-10-CM | POA: Diagnosis not present

## 2013-05-04 ENCOUNTER — Other Ambulatory Visit: Payer: Self-pay

## 2013-05-04 MED ORDER — TICAGRELOR 90 MG PO TABS: 90.0000 mg | ORAL_TABLET | Freq: Two times a day (BID) | ORAL | Status: DC

## 2013-05-04 NOTE — Telephone Encounter (Signed)
Refill sent for brilinta   

## 2013-05-05 ENCOUNTER — Other Ambulatory Visit: Payer: Self-pay | Admitting: *Deleted

## 2013-05-05 MED ORDER — PANTOPRAZOLE SODIUM 40 MG PO TBEC: 40.0000 mg | DELAYED_RELEASE_TABLET | Freq: Two times a day (BID) | ORAL | Status: DC

## 2013-05-05 NOTE — Telephone Encounter (Signed)
Refilled Pantoprazole sent to [facility].

## 2013-05-06 ENCOUNTER — Ambulatory Visit: Payer: Medicare Other | Admitting: Cardiovascular Disease

## 2013-05-11 ENCOUNTER — Other Ambulatory Visit: Payer: Self-pay | Admitting: *Deleted

## 2013-05-11 MED ORDER — ATORVASTATIN CALCIUM 20 MG PO TABS: 20.0000 mg | ORAL_TABLET | Freq: Every day | ORAL | Status: DC

## 2013-05-11 MED ORDER — PANTOPRAZOLE SODIUM 40 MG PO TBEC: 40.0000 mg | DELAYED_RELEASE_TABLET | Freq: Two times a day (BID) | ORAL | Status: DC

## 2013-05-18 ENCOUNTER — Encounter: Payer: Self-pay | Admitting: Cardiovascular Disease

## 2013-05-18 ENCOUNTER — Ambulatory Visit (INDEPENDENT_AMBULATORY_CARE_PROVIDER_SITE_OTHER): Payer: Medicare Other | Admitting: Cardiovascular Disease

## 2013-05-18 VITALS — BP 140/74 | HR 67 | Ht 61.0 in | Wt 109.0 lb

## 2013-05-18 DIAGNOSIS — I2589 Other forms of chronic ischemic heart disease: Secondary | ICD-10-CM

## 2013-05-18 DIAGNOSIS — I2510 Atherosclerotic heart disease of native coronary artery without angina pectoris: Secondary | ICD-10-CM | POA: Diagnosis not present

## 2013-05-18 DIAGNOSIS — I255 Ischemic cardiomyopathy: Secondary | ICD-10-CM

## 2013-05-18 DIAGNOSIS — N289 Disorder of kidney and ureter, unspecified: Secondary | ICD-10-CM

## 2013-05-18 DIAGNOSIS — E785 Hyperlipidemia, unspecified: Secondary | ICD-10-CM | POA: Diagnosis not present

## 2013-05-18 DIAGNOSIS — I4891 Unspecified atrial fibrillation: Secondary | ICD-10-CM

## 2013-05-18 DIAGNOSIS — I10 Essential (primary) hypertension: Secondary | ICD-10-CM | POA: Diagnosis not present

## 2013-05-18 DIAGNOSIS — I480 Paroxysmal atrial fibrillation: Secondary | ICD-10-CM

## 2013-05-18 NOTE — Assessment & Plan Note (Signed)
Currently with no symptoms of angina. No further workup at this time. Continue current medication regimen. 

## 2013-05-18 NOTE — Progress Notes (Signed)
Patient ID: Linda Hart, female    DOB: [birth year], 79 y.o.   MRN: [id number]  HPI Comments: Ms. Linda Hart is a very pleasant 79 year old woman with  PMHx s/f CAD (NSTEMI 03/06/13 s/p DES-LAD), PAF (in setting of NSTEMI and reperfusion), ischemic cardiomyopathy (EF 30-35% on 03/09/13 echo), SVT (self-limited on OP monitor), GERD, remote h/o lightheadedness and syncope who was re-admitted to [facility] with worsening DOE/SOB may 2014. She presents for routine followup from skilled nursing facility.    She was admitted 03/05/13 with NSTEMI.  cardiac catheterization revealing tubular 90% prox extending to mid LAD stenosis s/p DES, mild atheresclerosis in LCx, RCA.  Post-cath echo showed EF 30-35%, severe anterior, septal and apical HK, mild TR/MR, mildly elevated EF described as reduced. DAPT- ASA/Brilinta x 12 months recommended.  Cr did bump to 1.5-1.6 suspected to be secondary to contrast induced precluding the addition of ACEi/ARB.  Limited repeat echo indicated LVEF 30-35%. She declined LifeVest.   After discharge from the hospital, creatinine was 2 or greater. It was felt that she was not drinking very much and Lasix was held. Creatinine in June 2014 improved to 1.5.  Overall she feels well, is active, is ambulating without a cane or walker. [place]. The symptoms of chest pain or shortness of breath. No significant tachycardia or palpitations. She is moving into independent living this week at the [facility].  Previous event monitor showed frequent supraventricular ectopy, rare PVCs.   She had short runs of SVT, the longest was 11 beats. She had 46 short runs. Uncertain if she was symptomatic during these short runs of SVT    EKG shows normal sinus rhythm with rate 67 beats per minute, T wave abnormality V3 to V4   Outpatient Encounter Prescriptions as of 05/18/2013  Medication Sig Dispense Refill  . aspirin 81 MG tablet Take 81 mg by mouth  daily.        Marland Kitchen atorvastatin (LIPITOR) 20 MG tablet Take 1 tablet (20 mg total) by mouth daily.  30 tablet  2  . carvedilol (COREG) 3.125 MG tablet Take 3.125 mg by mouth 2 (two) times daily with a meal.      . cyanocobalamin (,VITAMIN B-12,) 1000 MCG/ML injection INJECT 1 ML IM EACH MONTH  10 mL  3  . Cyanocobalamin (VITAMIN B-12 IJ) Inject as directed every 30 (thirty) days.      . furosemide (LASIX) 40 MG tablet 40 mg daily.       Marland Kitchen levalbuterol (XOPENEX HFA) 45 MCG/ACT inhaler Inhale 1-2 puffs into the lungs every 4 (four) hours as needed for wheezing.      . nitroGLYCERIN (NITROSTAT) 0.4 MG SL tablet Place 0.4 mg under the tongue every 5 (five) minutes as needed for chest pain.      . pantoprazole (PROTONIX) 40 MG tablet Take 1 tablet (40 mg total) by mouth 2 (two) times daily.  60 tablet  3  . promethazine (PHENERGAN) 25 MG tablet Take 25 mg by mouth as needed for nausea.      . Ticagrelor (BRILINTA) 90 MG TABS tablet Take 1 tablet (90 mg total) by mouth 2 (two) times daily.  60 tablet  6   No facility-administered encounter medications on file as of 05/18/2013.   Review of Systems  Constitutional: Negative.   HENT: Negative.   Eyes: Negative.   Respiratory: Negative.   Gastrointestinal: Negative.   Musculoskeletal: Positive for gait problem.  Skin: Negative.  Neurological: Negative.   Psychiatric/Behavioral: Negative.   All other systems reviewed and are negative.   BP 140/74  Pulse 67  Ht 5\' 1"  (1.549 m)  Wt 109 lb (49.442 kg)  BMI 20.61 kg/m2  Physical Exam  Nursing note and vitals reviewed. Constitutional: She is oriented to person, place, and time. She appears well-developed and well-nourished.  HENT:  Head: Normocephalic.  Nose: Nose normal.  Mouth/Throat: Oropharynx is clear and moist.  Eyes: Conjunctivae are normal. Pupils are equal, round, and reactive to light.  Neck: Normal range of motion. Neck supple. No JVD present.  Cardiovascular: Normal rate, regular  rhythm, S1 normal, S2 normal, normal heart sounds and intact distal pulses.  Exam reveals no gallop and no friction rub.   No murmur heard. Pulmonary/Chest: Effort normal and breath sounds normal. No respiratory distress. She has no wheezes. She has no rales. She exhibits no tenderness.  Abdominal: Soft. Bowel sounds are normal. She exhibits no distension. There is no tenderness.  Musculoskeletal: Normal range of motion. She exhibits no edema and no tenderness.  Lymphadenopathy:    She has no cervical adenopathy.  Neurological: She is alert and oriented to person, place, and time. Coordination normal.  Skin: Skin is warm and dry. No rash noted. No erythema.  Psychiatric: She has a normal mood and affect. Her behavior is normal. Judgment and thought content normal.    Assessment and Plan

## 2013-05-18 NOTE — Patient Instructions (Addendum)
You are doing well. No medication changes were made.  Please call us if you have new issues that need to be addressed before your next appt.  Your physician wants you to follow-up in: 6 months.  You will receive a reminder letter in the mail two months in advance. If you don't receive a letter, please call our office to schedule the follow-up appointment.   

## 2013-05-18 NOTE — Assessment & Plan Note (Signed)
No clinical signs of arrhythmia. No tachycardia or palpitations per the patient.

## 2013-05-18 NOTE — Assessment & Plan Note (Signed)
Blood pressure is well controlled on today's visit. No changes made to the medications. 

## 2013-05-18 NOTE — Assessment & Plan Note (Signed)
She takes Lasix periodically for ankle edema. On daily Lasix, she had renal dysfunction.

## 2013-05-18 NOTE — Assessment & Plan Note (Signed)
Renal function improved with less Lasix. Suspect this was secondary to dehydration.

## 2013-05-18 NOTE — Assessment & Plan Note (Addendum)
Need cholesterol check,  none recently. goal LDL less than 70.

## 2013-06-02 ENCOUNTER — Other Ambulatory Visit: Payer: Self-pay | Admitting: *Deleted

## 2013-06-03 ENCOUNTER — Other Ambulatory Visit: Payer: Self-pay

## 2013-07-01 ENCOUNTER — Other Ambulatory Visit: Payer: Self-pay | Admitting: *Deleted

## 2013-07-01 MED ORDER — PANTOPRAZOLE SODIUM 40 MG PO TBEC: 40.0000 mg | DELAYED_RELEASE_TABLET | Freq: Two times a day (BID) | ORAL | Status: DC

## 2013-07-01 NOTE — Telephone Encounter (Signed)
Eprescribed.

## 2013-07-06 ENCOUNTER — Other Ambulatory Visit: Payer: Self-pay | Admitting: *Deleted

## 2013-07-06 MED ORDER — CARVEDILOL 3.125 MG PO TABS: 3.1250 mg | ORAL_TABLET | Freq: Two times a day (BID) | ORAL | Status: DC

## 2013-07-06 NOTE — Telephone Encounter (Signed)
Eprescribed.

## 2013-07-13 ENCOUNTER — Telehealth: Payer: Self-pay

## 2013-07-13 NOTE — Telephone Encounter (Signed)
Pt daughter states pt is going to the dentist and wants to know about the medicines she is on. Please call.

## 2013-07-13 NOTE — Telephone Encounter (Signed)
Spoke with Cathy,patient's daughter. She is asking if OK to schedule a dental appt for her mother for check up and cleaning. I will review with Dr Mariah Milling.

## 2013-07-13 NOTE — Telephone Encounter (Signed)
Reviewed with Dr Lyda Jester to have teeth cleaned.

## 2013-07-13 NOTE — Telephone Encounter (Signed)
Dr Mariah Milling recommended any procedures?appts  that would require holding Brilinta should be avoided. Pt's daughter, Lynden Ang advised, verbalized understanding.

## 2013-07-14 ENCOUNTER — Ambulatory Visit (INDEPENDENT_AMBULATORY_CARE_PROVIDER_SITE_OTHER): Payer: Medicare Other | Admitting: Internal Medicine

## 2013-07-14 ENCOUNTER — Encounter: Payer: Self-pay | Admitting: Internal Medicine

## 2013-07-14 VITALS — BP 142/72 | HR 63 | Temp 97.6°F | Wt 106.0 lb

## 2013-07-14 DIAGNOSIS — D510 Vitamin B12 deficiency anemia due to intrinsic factor deficiency: Secondary | ICD-10-CM | POA: Diagnosis not present

## 2013-07-14 DIAGNOSIS — I10 Essential (primary) hypertension: Secondary | ICD-10-CM

## 2013-07-14 DIAGNOSIS — E039 Hypothyroidism, unspecified: Secondary | ICD-10-CM

## 2013-07-14 DIAGNOSIS — H9222 Otorrhagia, left ear: Secondary | ICD-10-CM

## 2013-07-14 DIAGNOSIS — H9210 Otorrhea, unspecified ear: Secondary | ICD-10-CM | POA: Diagnosis not present

## 2013-07-14 DIAGNOSIS — H9220 Otorrhagia, unspecified ear: Secondary | ICD-10-CM | POA: Insufficient documentation

## 2013-07-14 LAB — CBC WITH DIFFERENTIAL/PLATELET
Basophils Absolute: 0 10*3/uL (ref 0.0–0.1)
Basophils Relative: 0.5 % (ref 0.0–3.0)
Eosinophils Absolute: 0.3 10*3/uL (ref 0.0–0.7)
HCT: 34 % — ABNORMAL LOW (ref 36.0–46.0)
Hemoglobin: 11.1 g/dL — ABNORMAL LOW (ref 12.0–15.0)
Lymphs Abs: 1.9 10*3/uL (ref 0.7–4.0)
MCHC: 32.6 g/dL (ref 30.0–36.0)
Monocytes Relative: 11.4 % (ref 3.0–12.0)
Neutro Abs: 5.6 10*3/uL (ref 1.4–7.7)
RBC: 3.76 Mil/uL — ABNORMAL LOW (ref 3.87–5.11)
RDW: 15.3 % — ABNORMAL HIGH (ref 11.5–14.6)

## 2013-07-14 LAB — COMPREHENSIVE METABOLIC PANEL
ALT: 18 U/L (ref 0–35)
AST: 30 U/L (ref 0–37)
Alkaline Phosphatase: 82 U/L (ref 39–117)
BUN: 24 mg/dL — ABNORMAL HIGH (ref 6–23)
Creatinine, Ser: 1.5 mg/dL — ABNORMAL HIGH (ref 0.4–1.2)
Total Bilirubin: 0.6 mg/dL (ref 0.3–1.2)

## 2013-07-14 LAB — LIPID PANEL
Cholesterol: 137 mg/dL (ref 0–200)
LDL Cholesterol: 55 mg/dL (ref 0–99)
Total CHOL/HDL Ratio: 2
Triglycerides: 64 mg/dL (ref 0.0–149.0)
VLDL: 12.8 mg/dL (ref 0.0–40.0)

## 2013-07-14 NOTE — Assessment & Plan Note (Signed)
Pt reports small amount of blood from left ear canal yesterday, however exam today is normal. Suspect bleeding came from superficial abrasion. Encouraged her to avoid putting objects in her ears for cleaning. Will continue to monitor.

## 2013-07-14 NOTE — Assessment & Plan Note (Signed)
BP Readings from Last 3 Encounters:  07/14/13 142/72  05/18/13 140/74  04/03/13 110/66   BP generally well controlled. Will continue current medications. Will check renal function with labs.

## 2013-07-14 NOTE — Progress Notes (Signed)
Subjective:    Patient ID: Linda Hart, female    DOB: 1924/12/15, 79 y.o.   MRN: [id number]  HPI 79 year old female with history of hypertension, hyperlipidemia presents for acute visit complaining of bleeding from her left ear last night. She reports that she touched her ear and noticed bright red blood on her finger. She denies any trauma to her ear. She denies any ear pain, change in hearing, nasal congestion, fever, chills. She does occasionally clean her ears with her fingernail. She has not had any more drainage from her ear today.  She also notes some mild fatigue. She notes that she has been poor recently and is generally readings throughout the day. Sometimes her eyes feel "heavy." However, she denies visual changes. She reports she is generally feeling well.  Outpatient Encounter Prescriptions as of 07/14/2013  Medication Sig Dispense Refill  . aspirin 81 MG tablet Take 81 mg by mouth daily.        Marland Kitchen atorvastatin (LIPITOR) 20 MG tablet Take 1 tablet (20 mg total) by mouth daily.  30 tablet  2  . carvedilol (COREG) 3.125 MG tablet Take 1 tablet (3.125 mg total) by mouth 2 (two) times daily with a meal.  60 tablet  1  . cyanocobalamin (,VITAMIN B-12,) 1000 MCG/ML injection INJECT 1 ML IM EACH MONTH  10 mL  3  . Cyanocobalamin (VITAMIN B-12 IJ) Inject as directed every 30 (thirty) days.      Marland Kitchen levalbuterol (XOPENEX HFA) 45 MCG/ACT inhaler Inhale 1-2 puffs into the lungs every 4 (four) hours as needed for wheezing.      . nitroGLYCERIN (NITROSTAT) 0.4 MG SL tablet Place 0.4 mg under the tongue every 5 (five) minutes as needed for chest pain.      . pantoprazole (PROTONIX) 40 MG tablet Take 1 tablet (40 mg total) by mouth 2 (two) times daily.  60 tablet  3  . Ticagrelor (BRILINTA) 90 MG TABS tablet Take 1 tablet (90 mg total) by mouth 2 (two) times daily.  60 tablet  6  . furosemide (LASIX) 40 MG tablet 40 mg daily as needed.       . promethazine (PHENERGAN) 25 MG tablet Take 25 mg by  mouth as needed for nausea.       No facility-administered encounter medications on file as of 07/14/2013.   BP 142/72  Pulse 63  Temp(Src) 97.6 F (36.4 C) (Oral)  Wt 106 lb (48.081 kg)  BMI 20.04 kg/m2  SpO2 96%  Review of Systems  Constitutional: Negative for fever, chills, appetite change, fatigue and unexpected weight change.  HENT: Positive for ear discharge (blood from left ear yesterday). Negative for hearing loss, ear pain, nosebleeds, congestion, sore throat, rhinorrhea, neck pain, postnasal drip, sinus pressure and tinnitus.   Eyes: Negative for visual disturbance.  Respiratory: Negative for cough and shortness of breath.   Cardiovascular: Negative for chest pain, palpitations and leg swelling.  Gastrointestinal: Negative for abdominal pain.  Skin: Negative for color change and rash.  Neurological: Negative for dizziness and headaches.  Hematological: Negative for adenopathy. Does not bruise/bleed easily.  Psychiatric/Behavioral: Negative for dysphoric mood. The patient is not nervous/anxious.        Objective:   Physical Exam  Constitutional: She is oriented to person, place, and time. She appears well-developed and well-nourished. No distress.  HENT:  Head: Normocephalic and atraumatic.  Right Ear: Tympanic membrane, external ear and ear canal normal.  Left Ear: Tympanic membrane, external ear and ear canal normal.  Nose: Nose normal.  Mouth/Throat: Oropharynx is clear and moist. No oropharyngeal exudate.  Eyes: Conjunctivae are normal. Pupils are equal, round, and reactive to light. Right eye exhibits no discharge. Left eye exhibits no discharge. No scleral icterus.  Neck: Normal range of motion. Neck supple. No tracheal deviation present. No thyromegaly present.  Cardiovascular: Normal rate, regular rhythm, normal heart sounds and intact distal pulses.  Exam reveals no gallop and no friction rub.   No murmur heard. Pulmonary/Chest: Effort normal and breath sounds  normal. No accessory muscle usage. Not tachypneic. No respiratory distress. She has no decreased breath sounds. She has no wheezes. She has no rhonchi. She has no rales. She exhibits no tenderness.  Musculoskeletal: Normal range of motion. She exhibits no edema and no tenderness.  Lymphadenopathy:    She has no cervical adenopathy.  Neurological: She is alert and oriented to person, place, and time. No cranial nerve deficit. She exhibits normal muscle tone. Coordination normal.  Skin: Skin is warm and dry. No rash noted. She is not diaphoretic. No erythema. No pallor.  Psychiatric: She has a normal mood and affect. Her behavior is normal. Judgment and thought content normal.          Assessment & Plan:

## 2013-07-14 NOTE — Assessment & Plan Note (Signed)
Will check B12 and CBC with labs today.

## 2013-07-16 ENCOUNTER — Telehealth: Payer: Self-pay | Admitting: *Deleted

## 2013-07-16 NOTE — Telephone Encounter (Signed)
Patient never been seen by a kidney specialist and daughter also state it is very difficult for her to get back and forth. She will try to come by here to pick that up.

## 2013-07-29 DIAGNOSIS — Z23 Encounter for immunization: Secondary | ICD-10-CM | POA: Diagnosis not present

## 2013-08-18 ENCOUNTER — Other Ambulatory Visit: Payer: Self-pay | Admitting: Internal Medicine

## 2013-08-25 DIAGNOSIS — H04129 Dry eye syndrome of unspecified lacrimal gland: Secondary | ICD-10-CM | POA: Diagnosis not present

## 2013-08-26 ENCOUNTER — Other Ambulatory Visit: Payer: Self-pay | Admitting: Internal Medicine

## 2013-09-03 ENCOUNTER — Other Ambulatory Visit: Payer: Self-pay

## 2013-09-16 ENCOUNTER — Other Ambulatory Visit: Payer: Self-pay | Admitting: Internal Medicine

## 2013-10-02 ENCOUNTER — Ambulatory Visit (INDEPENDENT_AMBULATORY_CARE_PROVIDER_SITE_OTHER): Payer: Medicare Other | Admitting: Cardiovascular Disease

## 2013-10-02 ENCOUNTER — Encounter: Payer: Self-pay | Admitting: Cardiovascular Disease

## 2013-10-02 VITALS — BP 120/62 | HR 63 | Ht 61.0 in | Wt 104.0 lb

## 2013-10-02 DIAGNOSIS — E785 Hyperlipidemia, unspecified: Secondary | ICD-10-CM | POA: Diagnosis not present

## 2013-10-02 DIAGNOSIS — I10 Essential (primary) hypertension: Secondary | ICD-10-CM

## 2013-10-02 DIAGNOSIS — I2510 Atherosclerotic heart disease of native coronary artery without angina pectoris: Secondary | ICD-10-CM | POA: Diagnosis not present

## 2013-10-02 DIAGNOSIS — I2589 Other forms of chronic ischemic heart disease: Secondary | ICD-10-CM

## 2013-10-02 DIAGNOSIS — R0602 Shortness of breath: Secondary | ICD-10-CM | POA: Diagnosis not present

## 2013-10-02 DIAGNOSIS — I255 Ischemic cardiomyopathy: Secondary | ICD-10-CM

## 2013-10-02 NOTE — Progress Notes (Signed)
Patient ID: Linda Hart, female    DOB: [birth year], 79 y.o.   MRN: [id number]  HPI Comments: Linda Hart is a very pleasant 79 year old woman with  PMHx s/f CAD (NSTEMI 03/06/13 s/p DES-LAD), PAF (in setting of NSTEMI and reperfusion), ischemic cardiomyopathy (EF 30-35% on 03/09/13 echo), SVT (self-limited on OP monitor), GERD, remote h/o lightheadedness and syncope who was re-admitted to [facility] with worsening DOE/SOB may 2014. She presents for routine followup from skilled nursing facility.  She lives at the [facility] [place]  She was admitted 03/05/13 with NSTEMI.  cardiac catheterization revealing tubular 90% prox extending to mid LAD stenosis s/p DES, mild atheresclerosis in LCx, RCA.  Post-cath echo showed EF 30-35%, severe anterior, septal and apical HK, mild TR/MR, mildly elevated EF described as reduced. DAPT- ASA/Brilinta x 12 months recommended.  Cr did bump to 1.5-1.6 suspected to be secondary to contrast induced precluding the addition of ACEi/ARB.  Limited repeat echo indicated LVEF 30-35%. She declined LifeVest.   After discharge from the hospital, creatinine was >2,  Lasix was held.  Creatinine in June 2014 improved to 1.5.  Overall she feels well, is active, is ambulating without a cane or walker. No  symptoms of chest pain or shortness of breath. No significant tachycardia or palpitations. She is moving into independent living this week at the [facility] [place]. Weight has continued to drop, down another 5 pounds from July 2014. At home she weighs 101 pounds. 104 pounds in the office  Previous event monitor showed frequent supraventricular ectopy, rare PVCs.   She had short runs of SVT, the longest was 11 beats. She had 46 short runs. Uncertain if she was symptomatic during these short runs of SVT    EKG shows normal sinus rhythm with rate 66 beats per minute, no significant ST or T wave changes   Outpatient Encounter Prescriptions as of 10/02/2013  Medication Sig  . aspirin  81 MG tablet Take 81 mg by mouth daily.    Marland Kitchen atorvastatin (LIPITOR) 20 MG tablet Take 1 Tablet daily at bedtime.  . carvedilol (COREG) 3.125 MG tablet TAKE ONE TABLET TWICE A DAY WITH MEALS.  . cyanocobalamin (,VITAMIN B-12,) 1000 MCG/ML injection INJECT 1 ML IM EACH MONTH  . Cyanocobalamin (VITAMIN B-12 IJ) Inject as directed every 30 (thirty) days.  . furosemide (LASIX) 40 MG tablet 40 mg daily as needed.   . levalbuterol (XOPENEX HFA) 45 MCG/ACT inhaler Inhale 1-2 puffs into the lungs every 4 (four) hours as needed for wheezing.  . nitroGLYCERIN (NITROSTAT) 0.4 MG SL tablet Place 0.4 mg under the tongue every 5 (five) minutes as needed for chest pain.  . pantoprazole (PROTONIX) 40 MG tablet Take 40 mg by mouth 2 (two) times daily.   . Ticagrelor (BRILINTA) 90 MG TABS tablet Take 1 tablet (90 mg total) by mouth 2 (two) times daily.  . [DISCONTINUED] atorvastatin (LIPITOR) 20 MG tablet TAKE 1 TABLET EVERY DAY USUALLY IN THE EVENING  . [DISCONTINUED] promethazine (PHENERGAN) 25 MG tablet Take 25 mg by mouth as needed for nausea.  . [DISCONTINUED] pantoprazole (PROTONIX) 40 MG tablet Take 1 tablet (40 mg total) by mouth 2 (two) times daily.   Review of Systems  Constitutional: Negative.   HENT: Negative.   Eyes: Negative.   Respiratory: Negative.   Cardiovascular: Negative.   Gastrointestinal: Negative.   Endocrine: Negative.   Musculoskeletal: Positive for gait problem.  Skin: Negative.   Allergic/Immunologic: Negative.   Neurological: Negative.   Hematological: Negative.  Psychiatric/Behavioral: Negative.   All other systems reviewed and are negative.   BP 120/62  Pulse 63  Ht 5\' 1"  (1.549 m)  Wt 104 lb (47.174 kg)  BMI 19.66 kg/m2  Physical Exam  Nursing note and vitals reviewed. Constitutional: She is oriented to person, place, and time. She appears well-developed and well-nourished.  HENT:  Head: Normocephalic.  Nose: Nose normal.  Mouth/Throat: Oropharynx is clear and  moist.  Eyes: Conjunctivae are normal. Pupils are equal, round, and reactive to light.  Neck: Normal range of motion. Neck supple. No JVD present.  Cardiovascular: Normal rate, regular rhythm, S1 normal, S2 normal, normal heart sounds and intact distal pulses.  Exam reveals no gallop and no friction rub.   No murmur heard. Pulmonary/Chest: Effort normal and breath sounds normal. No respiratory distress. She has no wheezes. She has no rales. She exhibits no tenderness.  Abdominal: Soft. Bowel sounds are normal. She exhibits no distension. There is no tenderness.  Musculoskeletal: Normal range of motion. She exhibits no edema and no tenderness.  Lymphadenopathy:    She has no cervical adenopathy.  Neurological: She is alert and oriented to person, place, and time. Coordination normal.  Skin: Skin is warm and dry. No rash noted. No erythema.  Psychiatric: She has a normal mood and affect. Her behavior is normal. Judgment and thought content normal.    Assessment and Plan

## 2013-10-02 NOTE — Assessment & Plan Note (Signed)
Blood pressure is well controlled on today's visit. No changes made to the medications. Suggested she maintain her weight higher than current levels to avoid episodes of hypotension

## 2013-10-02 NOTE — Assessment & Plan Note (Signed)
Appears relatively euvolemic. Currently not taking Lasix on a regular basis

## 2013-10-02 NOTE — Assessment & Plan Note (Signed)
Cholesterol is at goal on the current lipid regimen. No changes to the medications were made.  

## 2013-10-02 NOTE — Assessment & Plan Note (Signed)
Currently with no symptoms of angina. No further workup at this time. Continue current medication regimen. 

## 2013-10-02 NOTE — Patient Instructions (Signed)
You are doing well. No medication changes were made.  Please call us if you have new issues that need to be addressed before your next appt.  Your physician wants you to follow-up in: 6 months.  You will receive a reminder letter in the mail two months in advance. If you don't receive a letter, please call our office to schedule the follow-up appointment.   

## 2013-11-20 ENCOUNTER — Other Ambulatory Visit: Payer: Self-pay | Admitting: Internal Medicine

## 2013-12-17 ENCOUNTER — Other Ambulatory Visit: Payer: Self-pay | Admitting: Cardiovascular Disease

## 2013-12-23 ENCOUNTER — Ambulatory Visit (INDEPENDENT_AMBULATORY_CARE_PROVIDER_SITE_OTHER): Payer: Medicare Other | Admitting: Cardiovascular Disease

## 2013-12-23 ENCOUNTER — Encounter: Payer: Self-pay | Admitting: Cardiovascular Disease

## 2013-12-23 VITALS — BP 112/60 | HR 67 | Ht 61.0 in | Wt 102.5 lb

## 2013-12-23 DIAGNOSIS — I4891 Unspecified atrial fibrillation: Secondary | ICD-10-CM | POA: Diagnosis not present

## 2013-12-23 DIAGNOSIS — I2589 Other forms of chronic ischemic heart disease: Secondary | ICD-10-CM

## 2013-12-23 DIAGNOSIS — I10 Essential (primary) hypertension: Secondary | ICD-10-CM

## 2013-12-23 DIAGNOSIS — I2510 Atherosclerotic heart disease of native coronary artery without angina pectoris: Secondary | ICD-10-CM

## 2013-12-23 DIAGNOSIS — I480 Paroxysmal atrial fibrillation: Secondary | ICD-10-CM

## 2013-12-23 DIAGNOSIS — I255 Ischemic cardiomyopathy: Secondary | ICD-10-CM

## 2013-12-23 DIAGNOSIS — R0602 Shortness of breath: Secondary | ICD-10-CM

## 2013-12-23 DIAGNOSIS — E785 Hyperlipidemia, unspecified: Secondary | ICD-10-CM

## 2013-12-23 MED ORDER — FUROSEMIDE 20 MG PO TABS: 20.0000 mg | ORAL_TABLET | Freq: Every day | ORAL | Status: DC | PRN

## 2013-12-23 NOTE — Assessment & Plan Note (Signed)
Currently with no symptoms of angina. No further workup at this time. Continue current medication regimen. 

## 2013-12-23 NOTE — Assessment & Plan Note (Signed)
Blood pressure is well controlled on today's visit. No changes made to the medications. 

## 2013-12-23 NOTE — Assessment & Plan Note (Signed)
Depressed ejection fraction 30-35%. Given her shortness of breath, unable to exclude worsening function. We have suggested she try Lasix for several doses, possibly every other day. If no improvement of her symptoms, repeat echocardiogram could be done.

## 2013-12-23 NOTE — Progress Notes (Signed)
Patient ID: Linda Hart, female    DOB: 23-Nov-1924, 80 y.o.   MRN: [id number]  HPI Comments: Ms. Landgren is a very pleasant 80 year old woman with  PMHx s/f CAD (NSTEMI 03/06/13 s/p DES-LAD), PAF (in setting of NSTEMI and reperfusion), ischemic cardiomyopathy (EF 30-35% on 03/09/13 echo), SVT (self-limited on OP monitor), GERD, remote h/o lightheadedness and syncope who was re-admitted to [facility] with worsening DOE/SOB may 2014. She presents for routine followup from skilled nursing facility.  She lives at the [place]  She was admitted 03/05/13 with NSTEMI.  cardiac catheterization revealing tubular 90% prox extending to mid LAD stenosis s/p DES, mild atheresclerosis in LCx, RCA.  Post-cath echo showed EF 30-35%, severe anterior, septal and apical HK, mild TR/MR, mildly elevated EF described as reduced. DAPT- ASA/Brilinta x 12 months recommended.  Cr did bump to 1.5-1.6 suspected to be secondary to contrast induced precluding the addition of ACEi/ARB.  Limited repeat echo indicated LVEF 30-35%. She declined LifeVest.   After discharge from the hospital, creatinine was >2,  Lasix was held.  Creatinine in June 2014 improved to 1.5.  In followup today, she presents with her daughter. 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 She reports having occasional episodes of shortness of breath with walking. Able to walk longer distances, but does have shortness of breath with shorter distances. She has not taken any Lasix in 6 months. Denies having any leg edema. No dramatic weight gain. No PND, orthopnea. Daughter reports that people noticed her unsteady gait recently. One episode she was walking very unsteady, had nausea, dizziness had to sit down. Blood pressure was elevated but did improve with resting. She feels that she had overexerted herself with the production and was tired  Previous event monitor showed frequent supraventricular ectopy,  rare PVCs.   She had short runs of SVT, the longest was 11 beats. She had 46 short runs. Uncertain if she was symptomatic during these short runs of SVT  Total cholesterol 137, LDL 55 Echocardiogram may 2014 with ejection fraction 30-35%, mild to moderate pulmonary hypertension    EKG shows normal sinus rhythm with rate 67 beats per minute, no significant ST or T wave changes   Outpatient Encounter Prescriptions as of 12/23/2013  Medication Sig  . aspirin 81 MG tablet Take 81 mg by mouth daily.    Marland Kitchen atorvastatin (LIPITOR) 20 MG tablet TAKE 1 TABLET EVERY DAY USUALLY IN THE EVENING  . BRILINTA 90 MG TABS tablet TAKE ONE TABLET TWICE A DAY  . carvedilol (COREG) 3.125 MG tablet TAKE ONE TABLET TWICE A DAY WITH MEALS.  . cyanocobalamin (,VITAMIN B-12,) 1000 MCG/ML injection INJECT 1 ML IM EACH MONTH  . Cyanocobalamin (VITAMIN B-12 IJ) Inject as directed every 30 (thirty) days.  . furosemide (LASIX) 40 MG tablet 40 mg daily as needed.   . levalbuterol (XOPENEX HFA) 45 MCG/ACT inhaler Inhale 1-2 puffs into the lungs every 4 (four) hours as needed for wheezing.  . nitroGLYCERIN (NITROSTAT) 0.4 MG SL tablet Place 0.4 mg under the tongue every 5 (five) minutes as needed for chest pain.  . pantoprazole (PROTONIX) 40 MG tablet Take 40 mg by mouth 2 (two) times daily.    Review of Systems  Constitutional: Negative.   HENT: Negative.   Eyes: Negative.   Respiratory: Positive for shortness of breath.   Cardiovascular: Negative.   Gastrointestinal: Negative.   Endocrine: Negative.   Musculoskeletal: Positive for gait problem.  Skin: Negative.   Allergic/Immunologic: Negative.   Neurological: Positive for dizziness.  Hematological: Negative.   Psychiatric/Behavioral: Negative.   All other systems reviewed and are negative.   BP 112/60  Pulse 67  Ht 5\' 1"  (1.549 m)  Wt 102 lb 8 oz (46.494 kg)  BMI 19.38 kg/m2  Physical Exam  Nursing note and vitals reviewed. Constitutional: She is  oriented to person, place, and time. She appears well-developed and well-nourished.  HENT:  Head: Normocephalic.  Nose: Nose normal.  Mouth/Throat: Oropharynx is clear and moist.  Eyes: Conjunctivae are normal. Pupils are equal, round, and reactive to light.  Neck: Normal range of motion. Neck supple. No JVD present.  Cardiovascular: Normal rate, regular rhythm, S1 normal, S2 normal, normal heart sounds and intact distal pulses.  Exam reveals no gallop and no friction rub.   No murmur heard. Pulmonary/Chest: Effort normal and breath sounds normal. No respiratory distress. She has no wheezes. She has no rales. She exhibits no tenderness.  Abdominal: Soft. Bowel sounds are normal. She exhibits no distension. There is no tenderness.  Musculoskeletal: Normal range of motion. She exhibits no edema and no tenderness.  Lymphadenopathy:    She has no cervical adenopathy.  Neurological: She is alert and oriented to person, place, and time. Coordination normal.  Skin: Skin is warm and dry. No rash noted. No erythema.  Psychiatric: She has a normal mood and affect. Her behavior is normal. Judgment and thought content normal.    Assessment and Plan

## 2013-12-23 NOTE — Assessment & Plan Note (Signed)
Cholesterol is at goal on the current lipid regimen. No changes to the medications were made.  

## 2013-12-23 NOTE — Patient Instructions (Signed)
You are doing well. Please take lasix 20 mg every other day for a few days  for shortness of breath symptoms Take with potassium, a banana  Call the office if symptoms of shortness of breath persist  Please call us if you have new issues that need to be addressed before your next appt.  Your physician wants you to follow-up in: 6 months.  You will receive a reminder letter in the mail two months in advance. If you don't receive a letter, please call our office to schedule the follow-up appointment.

## 2013-12-23 NOTE — Assessment & Plan Note (Signed)
Symptoms are somewhat atypical in nature. Happens more with short walks, not with longer walking. Possible conditioning an issue, also getting weaker. Prior mild to moderate pulmonary hypertension in the setting of depressed ejection fraction. Suggested she try several doses of Lasix

## 2014-01-04 ENCOUNTER — Ambulatory Visit (INDEPENDENT_AMBULATORY_CARE_PROVIDER_SITE_OTHER): Payer: Medicare Other | Admitting: Internal Medicine

## 2014-01-04 ENCOUNTER — Encounter: Payer: Self-pay | Admitting: Emergency Medicine

## 2014-01-04 ENCOUNTER — Encounter: Payer: Self-pay | Admitting: Internal Medicine

## 2014-01-04 VITALS — BP 140/60 | HR 68 | Temp 97.7°F | Resp 17 | Wt 103.0 lb

## 2014-01-04 DIAGNOSIS — R059 Cough, unspecified: Secondary | ICD-10-CM | POA: Diagnosis not present

## 2014-01-04 DIAGNOSIS — R05 Cough: Secondary | ICD-10-CM

## 2014-01-04 DIAGNOSIS — I2589 Other forms of chronic ischemic heart disease: Secondary | ICD-10-CM

## 2014-01-04 DIAGNOSIS — H04123 Dry eye syndrome of bilateral lacrimal glands: Secondary | ICD-10-CM | POA: Insufficient documentation

## 2014-01-04 DIAGNOSIS — H04129 Dry eye syndrome of unspecified lacrimal gland: Secondary | ICD-10-CM

## 2014-01-04 LAB — CBC WITH DIFFERENTIAL/PLATELET
BASOS PCT: 0.6 % (ref 0.0–3.0)
Basophils Absolute: 0.1 10*3/uL (ref 0.0–0.1)
EOS PCT: 4.6 % (ref 0.0–5.0)
Eosinophils Absolute: 0.5 10*3/uL (ref 0.0–0.7)
HCT: 32.8 % — ABNORMAL LOW (ref 36.0–46.0)
Hemoglobin: 10.6 g/dL — ABNORMAL LOW (ref 12.0–15.0)
Lymphocytes Relative: 12.6 % (ref 12.0–46.0)
Lymphs Abs: 1.3 10*3/uL (ref 0.7–4.0)
MCHC: 32.4 g/dL (ref 30.0–36.0)
MCV: 88.9 fl (ref 78.0–100.0)
MONO ABS: 1.1 10*3/uL — AB (ref 0.1–1.0)
Monocytes Relative: 11.3 % (ref 3.0–12.0)
NEUTROS PCT: 70.9 % (ref 43.0–77.0)
Neutro Abs: 7.2 10*3/uL (ref 1.4–7.7)
Platelets: 303 10*3/uL (ref 150.0–400.0)
RBC: 3.7 Mil/uL — AB (ref 3.87–5.11)
RDW: 14.4 % (ref 11.5–14.6)
WBC: 10.1 10*3/uL (ref 4.5–10.5)

## 2014-01-04 LAB — COMPREHENSIVE METABOLIC PANEL
ALBUMIN: 3.1 g/dL — AB (ref 3.5–5.2)
ALK PHOS: 88 U/L (ref 39–117)
ALT: 14 U/L (ref 0–35)
AST: 23 U/L (ref 0–37)
BILIRUBIN TOTAL: 0.7 mg/dL (ref 0.3–1.2)
BUN: 24 mg/dL — ABNORMAL HIGH (ref 6–23)
CO2: 24 mEq/L (ref 19–32)
Calcium: 8.9 mg/dL (ref 8.4–10.5)
Chloride: 108 mEq/L (ref 96–112)
Creatinine, Ser: 1.4 mg/dL — ABNORMAL HIGH (ref 0.4–1.2)
GFR: 37.25 mL/min — ABNORMAL LOW (ref >60.00)
GLUCOSE: 106 mg/dL — AB (ref 70–99)
POTASSIUM: 4.6 meq/L (ref 3.5–5.1)
Sodium: 138 mEq/L (ref 135–145)
Total Protein: 6.3 g/dL (ref 6.0–8.3)

## 2014-01-04 LAB — BRAIN NATRIURETIC PEPTIDE: Pro B Natriuretic peptide (BNP): 363 pg/mL — ABNORMAL HIGH (ref 0.0–100.0)

## 2014-01-04 MED ORDER — LORATADINE 10 MG PO TABS: 10.0000 mg | ORAL_TABLET | Freq: Every day | ORAL | Status: DC

## 2014-01-04 NOTE — Patient Instructions (Signed)
Start using a humidifier in your room.  Start Claritin 10mg  daily.  We will check labs today.  Please get a chest xray this week.  Follow up in 2 weeks

## 2014-01-04 NOTE — Progress Notes (Signed)
Pre visit review using our clinic review tool, if applicable. No additional management support is needed unless otherwise documented below in the visit note. 

## 2014-01-04 NOTE — Progress Notes (Signed)
Subjective:    Patient ID: Linda Hart, female    DOB: 1925/05/05, 80 y.o.   MRN: [id number]  HPI 80YO female presents for acute visit.  Concerned about cough, worse at night last few weeks. Also notes the cough is worse generally when she is in her room at the [place]. Cough improved outside. Questions whether cough may be related to dry air in her room with heat on. Cough is productive of yellow phlegm. No dyspnea. No chest pain. No fever or chills. No URI symptoms. Continues to walk daily with no cough or shortness of breath noted.  Also notes that eyes have been dry, with flaking of dried mucous in eyelids on occasion. No visual changes. No photophobia. Was previously followed by Dr. Tobe Sos for dry eyes and treated with Restasis in the past.  Review of Systems  Constitutional: Negative for fever, chills, appetite change, fatigue and unexpected weight change.  HENT: Negative for congestion, ear pain, sinus pressure, sore throat, trouble swallowing and voice change.   Eyes: Negative for photophobia, pain, discharge, redness, itching and visual disturbance.  Respiratory: Positive for cough. Negative for shortness of breath, wheezing and stridor.   Cardiovascular: Negative for chest pain, palpitations and leg swelling.  Gastrointestinal: Negative for nausea, vomiting, abdominal pain, diarrhea, constipation, blood in stool, abdominal distention and anal bleeding.  Genitourinary: Negative for dysuria and flank pain.  Musculoskeletal: Negative for arthralgias, gait problem, myalgias and neck pain.  Skin: Negative for color change and rash.  Neurological: Negative for dizziness and headaches.  Hematological: Negative for adenopathy. Does not bruise/bleed easily.  Psychiatric/Behavioral: Negative for suicidal ideas, sleep disturbance and dysphoric mood. The patient is not nervous/anxious.        Objective:    BP 140/60  Pulse 68  Temp(Src) 97.7 F (36.5 C) (Oral)  Resp  17  Wt 103 lb (46.72 kg)  SpO2 98% Physical Exam  Constitutional: She is oriented to person, place, and time. She appears well-developed and well-nourished. No distress.  HENT:  Head: Normocephalic and atraumatic.  Right Ear: External ear normal.  Left Ear: External ear normal.  Nose: Nose normal.  Mouth/Throat: Oropharynx is clear and moist. No oropharyngeal exudate.  Eyes: Conjunctivae and EOM are normal. Pupils are equal, round, and reactive to light. Right eye exhibits no discharge. Left eye exhibits no discharge. No scleral icterus.  Neck: Normal range of motion. Neck supple. No tracheal deviation present. No thyromegaly present.  Cardiovascular: Normal rate, regular rhythm, normal heart sounds and intact distal pulses.  Exam reveals no gallop and no friction rub.   No murmur heard. Pulmonary/Chest: Effort normal and breath sounds normal. No accessory muscle usage. Not tachypneic. No respiratory distress. She has no decreased breath sounds. She has no wheezes. She has no rhonchi. She has no rales. She exhibits no tenderness.  Musculoskeletal: Normal range of motion. She exhibits no edema and no tenderness.  Lymphadenopathy:    She has no cervical adenopathy.  Neurological: She is alert and oriented to person, place, and time. No cranial nerve deficit. She exhibits normal muscle tone. Coordination normal.  Skin: Skin is warm and dry. No rash noted. She is not diaphoretic. No erythema. No pallor.  Psychiatric: She has a normal mood and affect. Her behavior is normal. Judgment and thought content normal.          Assessment & Plan:   Problem List Items Addressed This Visit   Cough - Primary     Recent cough which  seems to be triggered when she is in her room at [facility]. Question indoor allergy. Will start Claritin. Recommended using humidifier in her room. Exam is normal today. Appears euvolemia. BNP slightly elevated at 348, however this is likely normal for her age  and gender. Will monitor. If persistent symptoms of cough, consider repeat ECHO (last 02/2013 EF 30-35%). Will also get CXR today.    Relevant Medications      loratadine (CLARITIN) tablet 10 mg   Other Relevant Orders      Comp Met (CMET) (Completed)      CBC w/Diff (Completed)      B Nat Peptide (Completed)      DG Chest 2 View   Dry eyes     History of dry eyes treated with Restasis in the past. Will set up follow up with Dr. Tobe Sos for repeat examination.    Relevant Orders      Ambulatory referral to Ophthalmology       Return in about 2 weeks (around 01/18/2014).

## 2014-01-04 NOTE — Assessment & Plan Note (Signed)
History of dry eyes treated with Restasis in the past. Will set up follow up with Dr. Tobe Sos for repeat examination.

## 2014-01-04 NOTE — Assessment & Plan Note (Signed)
Recent cough which seems to be triggered when she is in her room at [facility]. Question indoor allergy. Will start Claritin. Recommended using humidifier in her room. Exam is normal today. Appears euvolemia. BNP slightly elevated at 348, however this is likely normal for her age and gender. Will monitor. If persistent symptoms of cough, consider repeat ECHO (last 02/2013 EF 30-35%). Will also get CXR today.

## 2014-01-07 DIAGNOSIS — H16209 Unspecified keratoconjunctivitis, unspecified eye: Secondary | ICD-10-CM | POA: Diagnosis not present

## 2014-01-20 DIAGNOSIS — H169 Unspecified keratitis: Secondary | ICD-10-CM | POA: Diagnosis not present

## 2014-01-26 ENCOUNTER — Ambulatory Visit (INDEPENDENT_AMBULATORY_CARE_PROVIDER_SITE_OTHER): Payer: Medicare Other | Admitting: Internal Medicine

## 2014-01-26 ENCOUNTER — Encounter: Payer: Self-pay | Admitting: Internal Medicine

## 2014-01-26 VITALS — BP 110/60 | HR 67 | Temp 97.5°F | Resp 16 | Wt 102.0 lb

## 2014-01-26 DIAGNOSIS — D649 Anemia, unspecified: Secondary | ICD-10-CM | POA: Diagnosis not present

## 2014-01-26 DIAGNOSIS — R0602 Shortness of breath: Secondary | ICD-10-CM

## 2014-01-26 DIAGNOSIS — I255 Ischemic cardiomyopathy: Secondary | ICD-10-CM

## 2014-01-26 DIAGNOSIS — I2589 Other forms of chronic ischemic heart disease: Secondary | ICD-10-CM | POA: Diagnosis not present

## 2014-01-26 LAB — COMPREHENSIVE METABOLIC PANEL
ALBUMIN: 3.4 g/dL — AB (ref 3.5–5.2)
ALK PHOS: 95 U/L (ref 39–117)
ALT: 14 U/L (ref 0–35)
AST: 28 U/L (ref 0–37)
BUN: 25 mg/dL — ABNORMAL HIGH (ref 6–23)
CALCIUM: 9 mg/dL (ref 8.4–10.5)
CHLORIDE: 105 meq/L (ref 96–112)
CO2: 24 mEq/L (ref 19–32)
Creatinine, Ser: 1.4 mg/dL — ABNORMAL HIGH (ref 0.4–1.2)
GFR: 39.15 mL/min — ABNORMAL LOW (ref >60.00)
GLUCOSE: 67 mg/dL — AB (ref 70–99)
POTASSIUM: 4.4 meq/L (ref 3.5–5.1)
SODIUM: 137 meq/L (ref 135–145)
Total Bilirubin: 0.9 mg/dL (ref 0.3–1.2)
Total Protein: 6.6 g/dL (ref 6.0–8.3)

## 2014-01-26 LAB — CBC WITH DIFFERENTIAL/PLATELET
Basophils Absolute: 0 10*3/uL (ref 0.0–0.1)
Basophils Relative: 0.5 % (ref 0.0–3.0)
Eosinophils Absolute: 0.6 10*3/uL (ref 0.0–0.7)
Eosinophils Relative: 5.8 % — ABNORMAL HIGH (ref 0.0–5.0)
HCT: 35.2 % — ABNORMAL LOW (ref 36.0–46.0)
HEMOGLOBIN: 11.4 g/dL — AB (ref 12.0–15.0)
Lymphocytes Relative: 14.9 % (ref 12.0–46.0)
Lymphs Abs: 1.4 10*3/uL (ref 0.7–4.0)
MCHC: 32.4 g/dL (ref 30.0–36.0)
MCV: 88.5 fl (ref 78.0–100.0)
MONOS PCT: 10.7 % (ref 3.0–12.0)
Monocytes Absolute: 1 10*3/uL (ref 0.1–1.0)
NEUTROS PCT: 68.1 % (ref 43.0–77.0)
Neutro Abs: 6.6 10*3/uL (ref 1.4–7.7)
Platelets: 303 10*3/uL (ref 150.0–400.0)
RBC: 3.97 Mil/uL (ref 3.87–5.11)
RDW: 14.7 % — ABNORMAL HIGH (ref 11.5–14.6)
WBC: 9.7 10*3/uL (ref 4.5–10.5)

## 2014-01-26 LAB — FERRITIN: Ferritin: 24.9 ng/mL (ref 10.0–291.0)

## 2014-01-26 NOTE — Assessment & Plan Note (Signed)
Unclear etiology of morning dyspnea. Will plan for cardiology follow up with repeat ECHO. Question worsening CHF. Appears euvolemic on exam today. Given that symptoms are atypical and seem to only occur in the morning, question if she may be having runs of AFIB. Question if Holter might be helpful.

## 2014-01-26 NOTE — Assessment & Plan Note (Signed)
Last ECHO 02/2013 EF 30-35%. Worsening symptoms of dyspnea, however seems to be confined to morning hours. Will set up cardiology follow up with repeat ECHO.

## 2014-01-26 NOTE — Assessment & Plan Note (Signed)
Lab Results  Component Value Date   HGB 10.6* 01/04/2014   Recent anemia noted on labs. This may be contributing to anemia. Stool hemocult pending. Will recheck CBC and ferritin today.

## 2014-01-26 NOTE — Progress Notes (Signed)
Subjective:    Patient ID: Linda Hart, female    DOB: 06/05/1925, 80 y.o.   MRN: [id number]  HPI 80YO female presents for follow up.  Dyspnea - Short of breath in the mornings, but much improved in afternoon. Has to take deep breaths in the morning. No chest pain or palpitations. No orthopnea.  Anemia - noted on recent labs. Reports she sent in IFOB testing.  Review of Systems  Constitutional: Negative for fever, chills, appetite change, fatigue and unexpected weight change.  HENT: Negative for congestion, ear pain, sinus pressure, sore throat, trouble swallowing and voice change.   Eyes: Negative for visual disturbance.  Respiratory: Positive for shortness of breath. Negative for cough, chest tightness, wheezing and stridor.   Cardiovascular: Negative for chest pain, palpitations and leg swelling.  Gastrointestinal: Negative for nausea, vomiting, abdominal pain, diarrhea, constipation, blood in stool, abdominal distention and anal bleeding.  Genitourinary: Negative for dysuria and flank pain.  Musculoskeletal: Negative for arthralgias, gait problem, myalgias and neck pain.  Skin: Negative for color change and rash.  Neurological: Negative for dizziness and headaches.  Hematological: Negative for adenopathy. Does not bruise/bleed easily.  Psychiatric/Behavioral: Negative for suicidal ideas, sleep disturbance and dysphoric mood. The patient is not nervous/anxious.        Objective:    BP 110/60  Pulse 67  Temp(Src) 97.5 F (36.4 C) (Oral)  Resp 16  Wt 102 lb (46.267 kg)  SpO2 99% Physical Exam  Constitutional: She is oriented to person, place, and time. She appears well-developed and well-nourished. No distress.  HENT:  Head: Normocephalic and atraumatic.  Right Ear: External ear normal.  Left Ear: External ear normal.  Nose: Nose normal.  Mouth/Throat: Oropharynx is clear and moist. No oropharyngeal exudate.  Eyes: Conjunctivae are normal. Pupils are equal, round,  and reactive to light. Right eye exhibits no discharge. Left eye exhibits no discharge. No scleral icterus.  Neck: Normal range of motion. Neck supple. No tracheal deviation present. No thyromegaly present.  Cardiovascular: Normal rate, regular rhythm, normal heart sounds and intact distal pulses.  Exam reveals no gallop and no friction rub.   No murmur heard. Pulmonary/Chest: Breath sounds normal. Accessory muscle usage (intermittent) present. Not tachypneic. No respiratory distress. She has no decreased breath sounds. She has no wheezes. She has no rhonchi. She has no rales. She exhibits no tenderness.  Musculoskeletal: Normal range of motion. She exhibits no edema and no tenderness.  Lymphadenopathy:    She has no cervical adenopathy.  Neurological: She is alert and oriented to person, place, and time. No cranial nerve deficit. She exhibits normal muscle tone. Coordination normal.  Skin: Skin is warm and dry. No rash noted. She is not diaphoretic. No erythema. No pallor.  Psychiatric: She has a normal mood and affect. Her behavior is normal. Judgment and thought content normal.          Assessment & Plan:   Problem List Items Addressed This Visit   Anemia      Lab Results  Component Value Date   HGB 10.6* 01/04/2014   Recent anemia noted on labs. This may be contributing to anemia. Stool hemocult pending. Will recheck CBC and ferritin today.    Relevant Orders      Fecal occult blood, imunochemical      CBC w/Diff      Ferritin   Cardiomyopathy, ischemic     Last ECHO 02/2013 EF 30-35%. Worsening symptoms of dyspnea, however seems to be confined to morning  hours. Will set up cardiology follow up with repeat ECHO.    Relevant Orders      Comp Met (CMET)   Shortness of breath - Primary     Unclear etiology of morning dyspnea. Will plan for cardiology follow up with repeat ECHO. Question worsening CHF. Appears euvolemic on exam today. Given that symptoms are atypical and seem to  only occur in the morning, question if she may be having runs of AFIB. Question if Holter might be helpful.    Relevant Orders      EKG 12-Lead (Completed)       Return in about 4 weeks (around 02/23/2014) for Recheck.

## 2014-01-26 NOTE — Progress Notes (Signed)
Pre visit review using our clinic review tool, if applicable. No additional management support is needed unless otherwise documented below in the visit note. 

## 2014-01-27 ENCOUNTER — Telehealth: Payer: Self-pay

## 2014-01-27 NOTE — Telephone Encounter (Signed)
Message copied by Stana Bunting on Wed Jan 27, 2014  8:09 AM ------      Message from: Minna Merritts      Created: Tue Jan 26, 2014  5:04 PM       Can we call the patient (and daughter especially) and ask about SOB.      Dr. Gilford Rile felt SOB was worse.      Would suggest repeat echo to evaluate cardiac function and pressures.      thx      Tim            ----- Message -----         From: Jackolyn Confer, MD         Sent: 01/26/2014  10:40 AM           To: Minna Merritts, MD            Tim - She is having persistent shortness of breath. Would you mind seeing her back and maybe repeating ECHO? Last showed EF 30-35% in 2014.       ------

## 2014-01-27 NOTE — Telephone Encounter (Signed)
Spoke w/ pt.  She reports that she is feeling much better and is having fewer episodes of SOB, usually when she overly exerts herself. She attributes this to "old age" and is not concerned about it at this time. Offered her appt for ECHO, but she declines, as she is preparing to travel to [place] w/ her daughter. Pt to call if sx worsen or continue, or if she would like to sched ECHO or appt.

## 2014-03-10 DIAGNOSIS — D3130 Benign neoplasm of unspecified choroid: Secondary | ICD-10-CM | POA: Diagnosis not present

## 2014-03-23 ENCOUNTER — Other Ambulatory Visit: Payer: Self-pay | Admitting: Internal Medicine

## 2014-03-31 ENCOUNTER — Encounter: Payer: Self-pay | Admitting: Cardiovascular Disease

## 2014-04-27 ENCOUNTER — Other Ambulatory Visit: Payer: Self-pay | Admitting: Cardiovascular Disease

## 2014-06-01 ENCOUNTER — Other Ambulatory Visit: Payer: Self-pay | Admitting: Internal Medicine

## 2014-07-15 ENCOUNTER — Ambulatory Visit: Payer: Managed Care, Other (non HMO) | Admitting: Cardiovascular Disease

## 2014-07-21 ENCOUNTER — Ambulatory Visit (INDEPENDENT_AMBULATORY_CARE_PROVIDER_SITE_OTHER): Payer: Medicare Other | Admitting: Cardiovascular Disease

## 2014-07-21 ENCOUNTER — Encounter: Payer: Self-pay | Admitting: Cardiovascular Disease

## 2014-07-21 VITALS — BP 139/75 | HR 75 | Ht 61.0 in | Wt 102.2 lb

## 2014-07-21 DIAGNOSIS — I2589 Other forms of chronic ischemic heart disease: Secondary | ICD-10-CM | POA: Diagnosis not present

## 2014-07-21 DIAGNOSIS — R0602 Shortness of breath: Secondary | ICD-10-CM

## 2014-07-21 DIAGNOSIS — I255 Ischemic cardiomyopathy: Secondary | ICD-10-CM

## 2014-07-21 DIAGNOSIS — E785 Hyperlipidemia, unspecified: Secondary | ICD-10-CM

## 2014-07-21 DIAGNOSIS — I498 Other specified cardiac arrhythmias: Secondary | ICD-10-CM

## 2014-07-21 DIAGNOSIS — I4891 Unspecified atrial fibrillation: Secondary | ICD-10-CM

## 2014-07-21 DIAGNOSIS — I10 Essential (primary) hypertension: Secondary | ICD-10-CM

## 2014-07-21 DIAGNOSIS — I2510 Atherosclerotic heart disease of native coronary artery without angina pectoris: Secondary | ICD-10-CM | POA: Diagnosis not present

## 2014-07-21 DIAGNOSIS — I480 Paroxysmal atrial fibrillation: Secondary | ICD-10-CM

## 2014-07-21 DIAGNOSIS — I471 Supraventricular tachycardia: Secondary | ICD-10-CM

## 2014-07-21 NOTE — Assessment & Plan Note (Signed)
Maintaining normal sinus rhythm 

## 2014-07-21 NOTE — Patient Instructions (Signed)
You are doing well. We will send in the lower dose brilinta to the pharmacy (if the pharmacy stocks the lower dose)  Please call us if you have new issues that need to be addressed before your next appt.  Your physician wants you to follow-up in: 6 months.  You will receive a reminder letter in the mail two months in advance. If you don't receive a letter, please call our office to schedule the follow-up appointment.

## 2014-07-21 NOTE — Progress Notes (Signed)
Patient ID: Linda Hart, female    DOB: July 14, 1925, 80 y.o.   MRN: [id number]  HPI Comments: Linda Hart is a very pleasant 80 year old woman with  PMHx s/f CAD (NSTEMI 03/06/13 s/p DES-LAD), PAF (in setting of NSTEMI and reperfusion), ischemic cardiomyopathy (EF 30-35% on 03/09/13 echo), SVT (self-limited on OP monitor), GERD, remote h/o lightheadedness and syncope who was re-admitted to [facility] with worsening DOE/SOB may 2014. She presents for routine followup from skilled nursing facility.  She lives at the [place]  She was admitted 03/05/13 with NSTEMI.  cardiac catheterization revealing tubular 90% prox extending to mid LAD stenosis s/p DES, mild atheresclerosis in LCx, RCA.  Post-cath echo showed EF 30-35%, severe anterior, septal and apical HK, mild TR/MR, mildly elevated EF described as reduced. DAPT- ASA/Brilinta x 12 months recommended.  Cr did bump to 1.5-1.6 suspected to be secondary to contrast induced precluding the addition of ACEi/ARB.  Limited repeat echo indicated LVEF 30-35%. She declined LifeVest.   After discharge from the hospital, creatinine was >2,  Lasix was held.  Creatinine in June 2014 improved to 1.5.  In followup today, she presents with her daughter. She has been very active,  She has not taken any Lasix in 6 months. Denies having any leg edema. No dramatic weight gain. No PND, orthopnea. She uses a cane to walk. Gait is still somewhat unsteady but stable. She does have fatigue with exertion but able to walk 3 laps around [place]  Previous event monitor showed frequent supraventricular ectopy, rare PVCs.   She had short runs of SVT, the longest was 11 beats. She had 46 short runs. Uncertain if she was symptomatic during these short runs of SVT  Total cholesterol 137, LDL 55 Echocardiogram may 2014 with ejection fraction 30-35%, mild to moderate pulmonary hypertension    EKG shows normal sinus rhythm with rate 75 beats per  minute, no significant ST or T wave changes   Outpatient Encounter Prescriptions as of 78/23/2015  Medication Sig  . aspirin 81 MG tablet Take 81 mg by mouth daily.    Marland Kitchen atorvastatin (LIPITOR) 20 MG tablet TAKE 1 TABLET EVERY DAY USUALLY IN THE EVENING  . BRILINTA 90 MG TABS tablet TAKE ONE TABLET TWICE A DAY  . carvedilol (COREG) 3.125 MG tablet TAKE ONE TABLET TWICE A DAY WITH MEALS.  Marland Kitchen Cyanocobalamin (VITAMIN B-12 IJ) Inject as directed every 30 (thirty) days.  . furosemide (LASIX) 20 MG tablet Take 1 tablet (20 mg total) by mouth daily as needed.  . levalbuterol (XOPENEX HFA) 45 MCG/ACT inhaler Inhale 1-2 puffs into the lungs every 4 (four) hours as needed for wheezing.  Marland Kitchen loratadine (CLARITIN) 10 MG tablet Take 1 tablet (10 mg total) by mouth daily.  . nitroGLYCERIN (NITROSTAT) 0.4 MG SL tablet Place 0.4 mg under the tongue every 5 (five) minutes as needed for chest pain.   Review of Systems  Constitutional: Negative.   HENT: Negative.   Eyes: Negative.   Respiratory: Positive for shortness of breath.   Cardiovascular: Negative.   Gastrointestinal: Negative.   Endocrine: Negative.   Musculoskeletal: Positive for gait problem.  Skin: Negative.   Allergic/Immunologic: Negative.   Neurological: Negative.   Hematological: Negative.   Psychiatric/Behavioral: Negative.   All other systems reviewed and are negative.  BP 139/75  Pulse 75  Ht 5\' 1"  (1.549 m)  Wt 102 lb 4 oz (46.38 kg)  BMI 19.33 kg/m2  Physical Exam  Nursing note  and vitals reviewed. Constitutional: She is oriented to person, place, and time. She appears well-developed and well-nourished.  HENT:  Head: Normocephalic.  Nose: Nose normal.  Mouth/Throat: Oropharynx is clear and moist.  Eyes: Conjunctivae are normal. Pupils are equal, round, and reactive to light.  Neck: Normal range of motion. Neck supple. No JVD present.  Cardiovascular: Normal rate, regular rhythm, S1 normal, S2 normal, normal heart sounds and  intact distal pulses.  Exam reveals no gallop and no friction rub.   No murmur heard. Pulmonary/Chest: Effort normal and breath sounds normal. No respiratory distress. She has no wheezes. She has no rales. She exhibits no tenderness.  Abdominal: Soft. Bowel sounds are normal. She exhibits no distension. There is no tenderness.  Musculoskeletal: Normal range of motion. She exhibits no edema and no tenderness.  Lymphadenopathy:    She has no cervical adenopathy.  Neurological: She is alert and oriented to person, place, and time. Coordination normal.  Skin: Skin is warm and dry. No rash noted. No erythema.  Psychiatric: She has a normal mood and affect. Her behavior is normal. Judgment and thought content normal.    Assessment and Plan

## 2014-07-21 NOTE — Assessment & Plan Note (Signed)
She appears relatively euvolemic on today's visit. She has not needed Lasix in many months. Denies any leg edema

## 2014-07-21 NOTE — Assessment & Plan Note (Signed)
Very mild shortness of breath in the morning but later in the day able to walk 3 laps around her pond. As he is doing so well, no further workup at this time

## 2014-07-21 NOTE — Assessment & Plan Note (Signed)
Blood pressure is well controlled on today's visit. No changes made to the medications. 

## 2014-07-21 NOTE — Assessment & Plan Note (Signed)
Cholesterol is at goal on the current lipid regimen. No changes to the medications were made.  

## 2014-07-21 NOTE — Assessment & Plan Note (Signed)
No symptoms concerning for arrhythmia

## 2014-07-21 NOTE — Assessment & Plan Note (Signed)
Currently with no symptoms of angina. No further workup at this time. We will decrease the dose of brilinta down to 60 mg twice a day. This is currently not available at her pharmacy that was available on order. We will confirm the dose with the company. Continue low-dose aspirin

## 2014-07-26 ENCOUNTER — Telehealth: Payer: Self-pay

## 2014-07-26 NOTE — Telephone Encounter (Signed)
Spoke w/ pt.  Advised her that I am calling in Brilinta 60mg  BID #60 to her pharmacy.  She verbalizes understanding and will call back w/ any questions or concerns.

## 2014-07-28 ENCOUNTER — Other Ambulatory Visit: Payer: Self-pay | Admitting: Internal Medicine

## 2014-08-10 ENCOUNTER — Encounter: Payer: Self-pay | Admitting: Internal Medicine

## 2014-08-10 ENCOUNTER — Ambulatory Visit (INDEPENDENT_AMBULATORY_CARE_PROVIDER_SITE_OTHER): Payer: Medicare Other | Admitting: Internal Medicine

## 2014-08-10 ENCOUNTER — Encounter: Payer: Self-pay | Admitting: *Deleted

## 2014-08-10 VITALS — BP 120/66 | HR 83 | Temp 97.6°F | Ht 61.0 in | Wt 106.5 lb

## 2014-08-10 DIAGNOSIS — D638 Anemia in other chronic diseases classified elsewhere: Secondary | ICD-10-CM | POA: Diagnosis not present

## 2014-08-10 DIAGNOSIS — I10 Essential (primary) hypertension: Secondary | ICD-10-CM

## 2014-08-10 DIAGNOSIS — Z23 Encounter for immunization: Secondary | ICD-10-CM | POA: Diagnosis not present

## 2014-08-10 DIAGNOSIS — E785 Hyperlipidemia, unspecified: Secondary | ICD-10-CM

## 2014-08-10 DIAGNOSIS — F329 Major depressive disorder, single episode, unspecified: Secondary | ICD-10-CM | POA: Diagnosis not present

## 2014-08-10 DIAGNOSIS — I255 Ischemic cardiomyopathy: Secondary | ICD-10-CM

## 2014-08-10 DIAGNOSIS — I2589 Other forms of chronic ischemic heart disease: Secondary | ICD-10-CM

## 2014-08-10 DIAGNOSIS — F32A Depression, unspecified: Secondary | ICD-10-CM

## 2014-08-10 LAB — MICROALBUMIN / CREATININE URINE RATIO
Creatinine,U: 128.2 mg/dL
Microalb Creat Ratio: 2.1 mg/g (ref 0.0–30.0)
Microalb, Ur: 2.7 mg/dL — ABNORMAL HIGH (ref 0.0–1.9)

## 2014-08-10 LAB — LIPID PANEL
CHOL/HDL RATIO: 2
Cholesterol: 139 mg/dL (ref 0–200)
HDL: 60.1 mg/dL (ref >39.00)
LDL Cholesterol: 55 mg/dL (ref 0–99)
NonHDL: 78.9
TRIGLYCERIDES: 120 mg/dL (ref 0.0–149.0)
VLDL: 24 mg/dL (ref 0.0–40.0)

## 2014-08-10 LAB — CBC WITH DIFFERENTIAL/PLATELET
Basophils Absolute: 0 10*3/uL (ref 0.0–0.1)
Basophils Relative: 0.2 % (ref 0.0–3.0)
EOS PCT: 4.1 % (ref 0.0–5.0)
Eosinophils Absolute: 0.4 10*3/uL (ref 0.0–0.7)
HEMATOCRIT: 33.3 % — AB (ref 36.0–46.0)
HEMOGLOBIN: 10.7 g/dL — AB (ref 12.0–15.0)
LYMPHS ABS: 1.7 10*3/uL (ref 0.7–4.0)
Lymphocytes Relative: 17.3 % (ref 12.0–46.0)
MCHC: 32.1 g/dL (ref 30.0–36.0)
MCV: 88.6 fl (ref 78.0–100.0)
MONO ABS: 1 10*3/uL (ref 0.1–1.0)
Monocytes Relative: 9.8 % (ref 3.0–12.0)
NEUTROS ABS: 6.6 10*3/uL (ref 1.4–7.7)
Neutrophils Relative %: 68.6 % (ref 43.0–77.0)
PLATELETS: 272 10*3/uL (ref 150.0–400.0)
RBC: 3.76 Mil/uL — ABNORMAL LOW (ref 3.87–5.11)
RDW: 15.3 % (ref 11.5–15.5)
WBC: 9.7 10*3/uL (ref 4.0–10.5)

## 2014-08-10 LAB — COMPREHENSIVE METABOLIC PANEL
ALT: 17 U/L (ref 0–35)
AST: 27 U/L (ref 0–37)
Albumin: 3 g/dL — ABNORMAL LOW (ref 3.5–5.2)
Alkaline Phosphatase: 85 U/L (ref 39–117)
BILIRUBIN TOTAL: 0.9 mg/dL (ref 0.2–1.2)
BUN: 30 mg/dL — ABNORMAL HIGH (ref 6–23)
CO2: 23 mEq/L (ref 19–32)
CREATININE: 1.5 mg/dL — AB (ref 0.4–1.2)
Calcium: 8.7 mg/dL (ref 8.4–10.5)
Chloride: 107 mEq/L (ref 96–112)
GFR: 34.39 mL/min — ABNORMAL LOW (ref >60.00)
Glucose, Bld: 81 mg/dL (ref 70–99)
POTASSIUM: 4.2 meq/L (ref 3.5–5.1)
SODIUM: 135 meq/L (ref 135–145)
Total Protein: 6.6 g/dL (ref 6.0–8.3)

## 2014-08-10 MED ORDER — ATORVASTATIN CALCIUM 20 MG PO TABS: 20.0000 mg | ORAL_TABLET | Freq: Every day | ORAL | Status: DC

## 2014-08-10 NOTE — Assessment & Plan Note (Signed)
BP Readings from Last 3 Encounters:  08/10/14 120/66  07/21/14 139/75  01/26/14 110/60   BP well controlled on current medications. Will continue. Renal function with labs today.

## 2014-08-10 NOTE — Assessment & Plan Note (Signed)
Will check lipids and LFTs. Continue Atorvastatin. 

## 2014-08-10 NOTE — Assessment & Plan Note (Signed)
Symptomatically doing well. Euvolemic on exam. Continue current medications.

## 2014-08-10 NOTE — Progress Notes (Signed)
Pre visit review using our clinic review tool, if applicable. No additional management support is needed unless otherwise documented below in the visit note. 

## 2014-08-10 NOTE — Patient Instructions (Signed)
Labs today.  Prevnar vaccine today.

## 2014-08-10 NOTE — Progress Notes (Signed)
Subjective:    Patient ID: Linda Hart, female    DOB: 1924-12-06, 80 y.o.   MRN: [id number]  HPI 81YO female presents for follow up.  Generally feel well. No recent chest pain, dyspnea.  Ran out of Lipitor. Would like to refill. Flu shot at [facility].  Mood has been "normal." Appetite is okay, although does not like food at her ALF.  Review of Systems  Constitutional: Negative for fever, chills, appetite change, fatigue and unexpected weight change.  Eyes: Negative for visual disturbance.  Respiratory: Negative for shortness of breath.   Cardiovascular: Negative for chest pain and leg swelling.  Gastrointestinal: Negative for abdominal pain, diarrhea and constipation.  Skin: Negative for color change and rash.  Hematological: Negative for adenopathy. Does not bruise/bleed easily.  Psychiatric/Behavioral: Negative for suicidal ideas, sleep disturbance and dysphoric mood. The patient is not nervous/anxious.        Objective:    BP 120/66  Pulse 83  Temp(Src) 97.6 F (36.4 C) (Oral)  Ht 5\' 1"  (1.549 m)  Wt 106 lb 8 oz (48.308 kg)  BMI 20.13 kg/m2  SpO2 98% Physical Exam  Constitutional: She is oriented to person, place, and time. She appears well-developed and well-nourished. No distress.  HENT:  Head: Normocephalic and atraumatic.  Right Ear: External ear normal.  Left Ear: External ear normal.  Nose: Nose normal.  Mouth/Throat: Oropharynx is clear and moist. No oropharyngeal exudate.  Eyes: Conjunctivae are normal. Pupils are equal, round, and reactive to light. Right eye exhibits no discharge. Left eye exhibits no discharge. No scleral icterus.  Neck: Normal range of motion. Neck supple. No tracheal deviation present. No thyromegaly present.  Cardiovascular: Normal rate, regular rhythm and intact distal pulses.  Exam reveals no gallop and no friction rub.   Murmur heard. Pulmonary/Chest: Effort normal and breath sounds normal. No accessory muscle  usage. Not tachypneic. No respiratory distress. She has no decreased breath sounds. She has no wheezes. She has no rhonchi. She has no rales. She exhibits no tenderness.  Musculoskeletal: Normal range of motion. She exhibits no edema and no tenderness.  Lymphadenopathy:    She has no cervical adenopathy.  Neurological: She is alert and oriented to person, place, and time. No cranial nerve deficit. She exhibits normal muscle tone. Coordination normal.  Skin: Skin is warm and dry. No rash noted. She is not diaphoretic. No erythema. No pallor.  Psychiatric: She has a normal mood and affect. Her behavior is normal. Judgment and thought content normal.          Assessment & Plan:   Problem List Items Addressed This Visit     Unprioritized   Cardiomyopathy, ischemic     Symptomatically doing well. Euvolemic on exam. Continue current medications.    Relevant Medications      atorvastatin (LIPITOR) tablet   Depression     Symptoms well controlled on current medication. Will continue.    Hyperlipidemia     Will check lipids and LFTs. Continue Atorvastatin.    Relevant Medications      atorvastatin (LIPITOR) tablet   Hypertension - Primary      BP Readings from Last 3 Encounters:  08/10/14 120/66  07/21/14 139/75  01/26/14 110/60   BP well controlled on current medications. Will continue. Renal function with labs today.    Relevant Medications      atorvastatin (LIPITOR) tablet   Other Relevant Orders      Comprehensive metabolic panel  Lipid panel      Microalbumin / creatinine urine ratio    Other Visit Diagnoses   Anemia in other chronic diseases classified elsewhere        Relevant Orders       CBC w/Diff        Return in about 6 months (around 02/09/2015) for Wellness Visit.

## 2014-08-10 NOTE — Assessment & Plan Note (Signed)
Symptoms well controlled on current medication. Will continue.

## 2014-08-10 NOTE — Addendum Note (Signed)
Addended by: Vernetta Honey on: 08/10/2014 10:47 AM   Modules accepted: Orders

## 2014-08-11 ENCOUNTER — Telehealth: Payer: Self-pay | Admitting: Internal Medicine

## 2014-08-11 NOTE — Telephone Encounter (Signed)
EMMI EMAILED  °

## 2014-09-01 ENCOUNTER — Other Ambulatory Visit: Payer: Self-pay | Admitting: *Deleted

## 2014-09-01 MED ORDER — TICAGRELOR 90 MG PO TABS: 90.0000 mg | ORAL_TABLET | Freq: Every day | ORAL | Status: DC

## 2014-10-01 ENCOUNTER — Other Ambulatory Visit: Payer: Self-pay | Admitting: Internal Medicine

## 2014-11-05 ENCOUNTER — Telehealth: Payer: Self-pay

## 2014-11-05 NOTE — Telephone Encounter (Signed)
We can see her in a 83min slot 1pm on Monday, however if coughing up mucous, then needs to be evaluated sooner this weekend. We have the clinic in [place] on Saturday if she would like to go there.

## 2014-11-05 NOTE — Telephone Encounter (Signed)
The patient is hoping to be worked in Monday for a cough.  She states she is coughing up mucus.   Do you want this patient worked in your schedule? If so, where?

## 2014-11-07 ENCOUNTER — Encounter: Payer: Self-pay | Admitting: Internal Medicine

## 2014-11-08 ENCOUNTER — Ambulatory Visit (INDEPENDENT_AMBULATORY_CARE_PROVIDER_SITE_OTHER): Payer: Medicare Other | Admitting: Internal Medicine

## 2014-11-08 ENCOUNTER — Encounter: Payer: Self-pay | Admitting: Internal Medicine

## 2014-11-08 VITALS — BP 148/77 | HR 88 | Temp 97.6°F | Ht 61.0 in | Wt 103.5 lb

## 2014-11-08 DIAGNOSIS — B9789 Other viral agents as the cause of diseases classified elsewhere: Principal | ICD-10-CM

## 2014-11-08 DIAGNOSIS — J069 Acute upper respiratory infection, unspecified: Secondary | ICD-10-CM | POA: Diagnosis not present

## 2014-11-08 MED ORDER — HYDROCOD POLST-CHLORPHEN POLST 10-8 MG/5ML PO LQCR: 5.0000 mL | Freq: Two times a day (BID) | ORAL | Status: DC | PRN

## 2014-11-08 NOTE — Telephone Encounter (Signed)
Pt notified and scheduled.

## 2014-11-08 NOTE — Patient Instructions (Addendum)
Rest. Increase fluid intake.  Continue Mucinex DM.  Start Tussionex at night as needed.  Follow up if any fever, chills, worsening cough, or shortness of breath.

## 2014-11-08 NOTE — Progress Notes (Signed)
Pre visit review using our clinic review tool, if applicable. No additional management support is needed unless otherwise documented below in the visit note. 

## 2014-11-08 NOTE — Assessment & Plan Note (Signed)
Symptoms most consistent with viral URI with cough. Encouraged rest, adequate fluids. Continue Mucinex DM and will add Tussionex as needed at night. Follow up if symptoms are not improving or if any new concerns.

## 2014-11-08 NOTE — Progress Notes (Signed)
   Subjective:    Patient ID: Linda Hart, female    DOB: April 01, 1925, 81 y.o.   MRN: [id number]  HPI 81YO female presents for acute visit for cough.  Developed cough about 1 week ago. Notes increased mucous production, yellow thick. No fever, chills. No dyspnea, chest pain. Taking Mucinex with minimal improvement.   Past medical, surgical, family and social history per today's encounter.  Review of Systems  Constitutional: Positive for fatigue. Negative for fever, chills, appetite change and unexpected weight change.  HENT: Positive for congestion, postnasal drip, rhinorrhea, sore throat and voice change. Negative for ear discharge, ear pain, facial swelling, hearing loss, mouth sores, nosebleeds, sinus pressure, sneezing, tinnitus and trouble swallowing.   Eyes: Negative for pain, discharge, redness and visual disturbance.  Respiratory: Positive for cough. Negative for chest tightness, shortness of breath, wheezing and stridor.   Cardiovascular: Negative for chest pain, palpitations and leg swelling.  Gastrointestinal: Negative for abdominal pain.  Musculoskeletal: Negative for myalgias, arthralgias, neck pain and neck stiffness.  Skin: Negative for color change and rash.  Neurological: Negative for dizziness, weakness, light-headedness and headaches.  Hematological: Negative for adenopathy. Does not bruise/bleed easily.  Psychiatric/Behavioral: Negative for dysphoric mood. The patient is not nervous/anxious.        Objective:    BP 148/77 mmHg  Pulse 88  Temp(Src) 97.6 F (36.4 C) (Oral)  Ht 5\' 1"  (1.549 m)  Wt 103 lb 8 oz (46.947 kg)  BMI 19.57 kg/m2  SpO2 98% Physical Exam  Constitutional: She is oriented to person, place, and time. She appears well-developed and well-nourished. No distress.  HENT:  Head: Normocephalic and atraumatic.  Right Ear: External ear normal.  Left Ear: External ear normal.  Nose: Nose normal.  Mouth/Throat: Oropharynx is clear and moist.  No oropharyngeal exudate.  Eyes: Conjunctivae are normal. Pupils are equal, round, and reactive to light. Right eye exhibits no discharge. Left eye exhibits no discharge. No scleral icterus.  Neck: Normal range of motion. Neck supple. No tracheal deviation present. No thyromegaly present.  Cardiovascular: Normal rate, regular rhythm, normal heart sounds and intact distal pulses.  Exam reveals no gallop and no friction rub.   No murmur heard. Pulmonary/Chest: Effort normal. No accessory muscle usage. No tachypnea. No respiratory distress. She has no decreased breath sounds. She has no wheezes. She has rhonchi (few scattered). She has no rales. She exhibits no tenderness.  Musculoskeletal: Normal range of motion. She exhibits no edema or tenderness.  Lymphadenopathy:    She has no cervical adenopathy.  Neurological: She is alert and oriented to person, place, and time. No cranial nerve deficit. She exhibits normal muscle tone. Coordination normal.  Skin: Skin is warm and dry. No rash noted. She is not diaphoretic. No erythema. No pallor.  Psychiatric: She has a normal mood and affect. Her behavior is normal. Judgment and thought content normal.          Assessment & Plan:   Problem List Items Addressed This Visit      Unprioritized   Viral URI with cough - Primary    Symptoms most consistent with viral URI with cough. Encouraged rest, adequate fluids. Continue Mucinex DM and will add Tussionex as needed at night. Follow up if symptoms are not improving or if any new concerns.    Relevant Medications      chlorpheniramine-HYDROcodone (TUSSIONEX) suspension 8-10 mg/28mL       Return if symptoms worsen or fail to improve.

## 2014-12-29 ENCOUNTER — Telehealth: Payer: Self-pay | Admitting: Cardiovascular Disease

## 2014-12-29 MED ORDER — FUROSEMIDE 20 MG PO TABS: 20.0000 mg | ORAL_TABLET | Freq: Every day | ORAL | Status: DC | PRN

## 2014-12-29 NOTE — Telephone Encounter (Signed)
Spoke w/ Juliann Pulse.  She reports that she was w/ pt on Sunday and pt become winded easily w/ exertion.  Reports that pt seems to be having worsening SOBOE that is worse in the am. She is unsure if pt weighs daily or of pt's fluid or sodium intake.  Called pt to speak w/ her.  She states that she is a resident at [facility] independent living and c/o SOB while walking down the halls.  Pt reports that she does not eat or drink very much, that she typically eats homemade chicken broth made w/ 1 saltine cracker for lunch and only drinks fluids when she eats.  Reviewed pt's med list w/ her.  She states that she does not have any lasix in her pill box, as this is a prn med and the nurse did not supply this for her.  Advised her that I am sending in a new rx for lasix, as her previous rx is over a year old.  Asked her to take her lasix today, if she can have someone pick it up for her.  Kathryne Hitch and discussed w/ her.   She states that she will have pt go the nurse's desk at [facility] and see if they can give her a lasix, and if not, she will pick up rx when she gets off work. Advised her to that pt may be up tonight if she takes it late to take safety precautions if pt will be getting out of bed frequently unsupervised.  Asked her to have pt call me in the am w/ her wt, BP and let me know if she is feeling better.  She is appreciative of the call and will have pt call me in the am.

## 2014-12-29 NOTE — Telephone Encounter (Signed)
Pt c/o Shortness Of Breath: STAT if SOB developed within the last 24 hours or pt is noticeably SOB on the phone  1. Are you currently SOB (can you hear that pt is SOB on the phone)? Daughter calling says she is audibly on phone  SOB but not as bad as it is in the morning  2. How long have you been experiencing SOB? Daughter noticed it Sunday when out to dinner with patient and patient walked short distance and was SOB on exertion and needed a minute to recover  3. Are you SOB when sitting or when up moving around? Both per the patient she is SOB upon waking and when walking   4. Are you currently experiencing any other symptoms? Daughter spoke with patient 5 minutes ago then called office

## 2014-12-30 NOTE — Telephone Encounter (Signed)
Pt daughter calling stating that mother, who is our patient, she states she her BP was 130/62. She was able to walk to the nurses station. Has not laid eyes on her but pt states she is doing better.  Took a lasix pill around 4 pm, she states she was not up all night. She is calling to also know if she is to take another pill today.   Please advise.  Told her nurse is in clinic and she did not want to at first leave a message but then told her I was not sure how long the wait would be and then she gave the message.  She said it would be okay.

## 2015-01-04 ENCOUNTER — Other Ambulatory Visit: Payer: Self-pay | Admitting: Cardiovascular Disease

## 2015-01-05 ENCOUNTER — Other Ambulatory Visit: Payer: Self-pay | Admitting: *Deleted

## 2015-01-05 MED ORDER — TICAGRELOR 90 MG PO TABS: 90.0000 mg | ORAL_TABLET | Freq: Two times a day (BID) | ORAL | Status: DC

## 2015-01-19 ENCOUNTER — Encounter: Payer: Self-pay | Admitting: Cardiovascular Disease

## 2015-01-19 ENCOUNTER — Ambulatory Visit (INDEPENDENT_AMBULATORY_CARE_PROVIDER_SITE_OTHER): Payer: Medicare Other | Admitting: Cardiovascular Disease

## 2015-01-19 VITALS — BP 102/62 | HR 71 | Ht 60.0 in | Wt 101.5 lb

## 2015-01-19 DIAGNOSIS — I255 Ischemic cardiomyopathy: Secondary | ICD-10-CM

## 2015-01-19 DIAGNOSIS — I10 Essential (primary) hypertension: Secondary | ICD-10-CM

## 2015-01-19 DIAGNOSIS — R002 Palpitations: Secondary | ICD-10-CM

## 2015-01-19 DIAGNOSIS — I480 Paroxysmal atrial fibrillation: Secondary | ICD-10-CM

## 2015-01-19 DIAGNOSIS — R0602 Shortness of breath: Secondary | ICD-10-CM

## 2015-01-19 DIAGNOSIS — E785 Hyperlipidemia, unspecified: Secondary | ICD-10-CM

## 2015-01-19 DIAGNOSIS — I471 Supraventricular tachycardia: Secondary | ICD-10-CM | POA: Diagnosis not present

## 2015-01-19 MED ORDER — TICAGRELOR 60 MG PO TABS: 60.0000 mg | ORAL_TABLET | Freq: Two times a day (BID) | ORAL | Status: DC

## 2015-01-19 NOTE — Assessment & Plan Note (Signed)
Blood pressure borderline low. Suggested she try to eat more for weight gain We'll avoid overdiuresis

## 2015-01-19 NOTE — Assessment & Plan Note (Signed)
She appears relatively euvolemic if not dehydrated on today's visit. Recent lab work always looks mildly dry. Warned her against using Lasix on a regular basis, only as needed for shortness of breath. No further ischemic workup at this time

## 2015-01-19 NOTE — Patient Instructions (Signed)
You are doing well. Please decrease the brilinta down to 60 mg daily Continue aspirin 81 mg daily  Please call us if you have new issues that need to be addressed before your next appt.  Your physician wants you to follow-up in: 6 months.  You will receive a reminder letter in the mail two months in advance. If you don't receive a letter, please call our office to schedule the follow-up appointment.

## 2015-01-19 NOTE — Assessment & Plan Note (Signed)
She denies any significant palpitations on today's visit. We'll continue low-dose beta blocker

## 2015-01-19 NOTE — Assessment & Plan Note (Signed)
Cholesterol is at goal on the current lipid regimen. No changes to the medications were made.  

## 2015-01-19 NOTE — Progress Notes (Signed)
Patient ID: Linda Hart, female    DOB: 03-23-1925, 81 y.o.   MRN: [id number]  HPI Comments: Linda Hart is a very pleasant 81 year old woman with  PMHx s/f CAD (NSTEMI 03/06/13 s/p DES-LAD), PAF (in setting of NSTEMI and reperfusion), ischemic cardiomyopathy (EF 30-35% on 03/09/13 echo), SVT (self-limited on OP monitor), GERD, remote h/o lightheadedness and syncope who was re-admitted to [facility] with worsening DOE/SOB may 2014. She presents for routine followup from skilled nursing facility.  She lives at the [facility] She presents today for follow-up of her coronary artery disease  She reports that she is doing well, daughter reports rare episodes of shortness of breath. They did call he office and reported shortness of breath symptoms. We've recommended she take Lasix sparingly. This seemed to help her symptoms, now resolved, feels back to her baseline. She denies any significant weight gain, leg edema.  She reports that she walks daily around her facility, blood pressure good but running low. Weight at home 98 pounds Denies any chest pain or shortness of breath, no near syncope or syncope  EKG on today's visit shows normal sinus rhythm with rate 71 bpm, no significant ST or T-wave changes  Other past medical history She was admitted 03/05/13 with NSTEMI.  cardiac catheterization revealing tubular 90% prox extending to mid LAD stenosis s/p DES, mild atheresclerosis in LCx, RCA.  Post-cath echo showed EF 30-35%, severe anterior, septal and apical HK, mild TR/MR, mildly elevated EF described as reduced. DAPT- ASA/Brilinta x 12 months recommended.  Cr did bump to 1.5-1.6 suspected to be secondary to contrast induced precluding the addition of ACEi/ARB.  Limited repeat echo indicated LVEF 30-35%. She declined LifeVest.   After discharge from the hospital, creatinine was >2,  Lasix was held.  Creatinine in June 2014 improved to 1.5.  Previous event monitor showed frequent supraventricular  ectopy, rare PVCs.   She had short runs of SVT, the longest was 11 beats. She had 46 short runs. Uncertain if she was symptomatic during these short runs of SVT  Total cholesterol 137, LDL 55 Echocardiogram may 2014 with ejection fraction 30-35%, mild to moderate pulmonary hypertension    Allergies  Allergen Reactions  . Penicillins     Rash/hives/swelling    Outpatient Encounter Prescriptions as of 01/19/2015  Medication Sig  . aspirin 81 MG tablet Take 81 mg by mouth daily.    Marland Kitchen atorvastatin (LIPITOR) 20 MG tablet Take 1 tablet (20 mg total) by mouth daily at 6 PM.  . carvedilol (COREG) 3.125 MG tablet TAKE ONE TABLET TWICE A DAY WITH MEALS.  . chlorpheniramine-HYDROcodone (TUSSIONEX) 10-8 MG/5ML LQCR Take 5 mLs by mouth every 12 (twelve) hours as needed.  . Cyanocobalamin (VITAMIN B-12 IJ) Inject as directed every 30 (thirty) days.  . furosemide (LASIX) 20 MG tablet Take 1 tablet (20 mg total) by mouth daily as needed.  . levalbuterol (XOPENEX HFA) 45 MCG/ACT inhaler Inhale 1-2 puffs into the lungs every 4 (four) hours as needed for wheezing.  Marland Kitchen loratadine (CLARITIN) 10 MG tablet Take 1 tablet (10 mg total) by mouth daily.  . nitroGLYCERIN (NITROSTAT) 0.4 MG SL tablet Place 0.4 mg under the tongue every 5 (five) minutes as needed for chest pain.  . ticagrelor (BRILINTA) 60 MG TABS tablet Take 1 tablet (60 mg total) by mouth 2 (two) times daily.  . [DISCONTINUED] ticagrelor (BRILINTA) 90 MG TABS tablet Take 1 tablet (90 mg total) by mouth 2 (two) times daily.    Past  Medical History  Diagnosis Date  . Dizziness   . Hypotension   . Coronary artery disease 03/06/13    90% prox LAD stenosis s/p DES  . Ischemic cardiomyopathy 03/09/13    s/p NSTEMI. EF 30-35%.  Marland Kitchen GERD (gastroesophageal reflux disease)   . PAF (paroxysmal atrial fibrillation) 03/06/13    In setting of NSTEMI and reperfusion, no recurrence  . SVT (supraventricular tachycardia)     Self-limited on outpatient cardiac  monitor  . MI (myocardial infarction)     Past Surgical History  Procedure Laterality Date  . Cesarean section    . Coronary angioplasty with stent placement  02/2013    DES-mid LAD, mild LCx, RCA stenoses    Social History  reports that she has never smoked. She has never used smokeless tobacco. She reports that she does not drink alcohol or use illicit drugs.  Family History family history includes Heart attack in her father; Heart failure in her father and mother.   Review of Systems  Constitutional: Negative.   Respiratory: Positive for shortness of breath.   Cardiovascular: Negative.   Gastrointestinal: Negative.   Musculoskeletal: Positive for gait problem.  Skin: Negative.   Neurological: Negative.   Hematological: Negative.   Psychiatric/Behavioral: Negative.   All other systems reviewed and are negative.  BP 102/62 mmHg  Pulse 71  Ht 5' (1.524 m)  Wt 101 lb 8 oz (46.04 kg)  BMI 19.82 kg/m2  Physical Exam  Constitutional: She is oriented to person, place, and time. She appears well-developed and well-nourished.  HENT:  Head: Normocephalic.  Nose: Nose normal.  Mouth/Throat: Oropharynx is clear and moist.  Eyes: Conjunctivae are normal. Pupils are equal, round, and reactive to light.  Neck: Normal range of motion. Neck supple. No JVD present.  Cardiovascular: Normal rate, regular rhythm, S1 normal, S2 normal, normal heart sounds and intact distal pulses.  Exam reveals no gallop and no friction rub.   No murmur heard. Pulmonary/Chest: Effort normal and breath sounds normal. No respiratory distress. She has no wheezes. She has no rales. She exhibits no tenderness.  Abdominal: Soft. Bowel sounds are normal. She exhibits no distension. There is no tenderness.  Musculoskeletal: Normal range of motion. She exhibits no edema or tenderness.  Lymphadenopathy:    She has no cervical adenopathy.  Neurological: She is alert and oriented to person, place, and time.  Coordination normal.  Skin: Skin is warm and dry. No rash noted. No erythema.  Psychiatric: She has a normal mood and affect. Her behavior is normal. Judgment and thought content normal.    Assessment and Plan  Nursing note and vitals reviewed.

## 2015-01-19 NOTE — Assessment & Plan Note (Signed)
She has been having rare shortness of breath episodes. Etiology not clear, will continue Lasix when necessary, monitoring her weight. If symptoms persist, need to also consider paroxysmal atrial fibrillation and 30 day event monitor could be ordered

## 2015-02-09 ENCOUNTER — Telehealth: Payer: Self-pay | Admitting: *Deleted

## 2015-02-09 NOTE — Telephone Encounter (Signed)
Spoke w/ pt's nurse, Gara Kroner, RN to clarify message.  She reports that pt is c/o trouble walking and decreased energy since med change.  She states that sx started after Brilinta was decreased from 90 mg to 60 mg.  Reports that pt's vitals are normal, she has no other complaints, and that pt is certain that sx are r/t change in Brilinta. Advised her that I will make Dr. Rockey Situ aware and call her back w/ his recommendation.

## 2015-02-09 NOTE — Telephone Encounter (Signed)
Pt c/o medication issue:  1. Name of Medication: Birlinta  2. How are you currently taking this medication (dosage and times per day)? Yes   3. Are you having a reaction (difficulty breathing--STAT)? No   4. What is your medication issue? effecting her walking,[place]aways tired. Wants to go back to normal dosage went from 60 to 60.

## 2015-02-10 NOTE — Telephone Encounter (Signed)
There should not be a difference in symptoms decreasing brilinta from 90 down to 60 mg twice a day This medication is a blood thinner There is a higher risk of bleeding on the 90 milligram dose twice a day so would prefer the lower dose to avoid stomach bleeding

## 2015-02-10 NOTE — Telephone Encounter (Signed)
Spoke w/ Cecille Rubin.  Advised her of Dr. Donivan Scull recommendation.  She is agreeable and states that she feels pt is dehydrated.  She reports that pt is feeling better today.  She states that she will have pt increase her fluids and call back if sx worsen or continue.

## 2015-02-18 NOTE — H&P (Signed)
PATIENT NAME:  Linda Hart, Linda Hart MR#:  [id number] DATE OF BIRTH:  14-Jul-1925  DATE OF ADMISSION:  03/11/2013  REFERRING PHYSICIAN:   Ponciano Ort, MD  PRIMARY CARE PHYSICIAN: Ronette Deter, MD  CARDIOLOGIST: [facility]   CHIEF COMPLAINT: Shortness of breath.   HISTORY OF PRESENT ILLNESS: The patient is a pleasant 81 year old Caucasian female who was just discharged a couple of days ago. The patient was admitted on the 8th of May and discharged on the 12th for non-ST elevation MI requiring a drug-eluting stent to proximal LAD during which she developed paroxysmal a-fib was noted to have significant depression in her ejection fraction to about 30% to 35%. After the stent placement, she was started on Lasix, a beta blocker and discharged to [facility]. The patient came back after experiencing some shortness of breath in the last day or so which is with exertion. She had brief episodes of chest pressure and it felt like "an albatross was on her neck." Currently she has no chest pain. Her BNP is elevated but not significantly so than the last BNP. Her current BNP is 34,711. Her creatinine is still up at 1.6 but appears to be close to the baseline that she was discharged with. She denies having any pains in the chest or shortness of breath significantly now. The case was discussed with [facility] by the ER physician, and there was thought to perhaps bring her in as she just had a non-ST elevation MI to evaluate her and monitor her in the short-term.   PAST MEDICAL HISTORY:  1.  History of hypertension.  2.  History of vitamin B12 deficiency. 3.  Although Parkinson's is in her records, she denies having Parkinson's, as well as her daughter who is in the room denies Parkinson's disease.  4.  The patient had a recent non-ST elevation MI requiring a drug-eluting stent to proximal LAD.  5.  Paroxysmal a-fib during the last hospitalization.  6.  Acute systolic CHF with ejection fraction of 30%  to 35% in the last admission.   PAST SURGICAL HISTORY: None.   SOCIAL HISTORY: She lives at a retirement community independently.  No tobacco, alcohol or drug use. Retired Optometrist.  FAMILY HISTORY: Father with CAD and heart attack.   OUTPATIENT MEDICATIONS: Aspirin 81 mg daily, atorvastatin 20 mg daily, carvedilol 6.25 mg 2 times a day, vitamin B12 1000 mcg injectable monthly, Lasix 40 mg daily, promethazine 25 mg every 4 hours as needed for nausea, vomiting, Protonix 40 mg 2 times a day, ticagrelor 90 mg every 12 hours.   REVIEW OF SYSTEMS:   CONSTITUTIONAL: No weight changes, no weakness. No fevers or chills.  EYES: Denies blurry vision or double vision.  ENT: No tinnitus or hearing loss.  RESPIRATORY: Has a dry cough. No wheezing.  Had some dyspnea on exertion.  No orthopnea or significant swelling in the lower extremities.  CARDIOVASCULAR: A brief episode of chest pain without radiation. No orthopnea.  GASTROINTESTINAL: Had an episode of nausea, vomiting prior to the chest pain. No diarrhea, abdominal pain, melena or dark stools.  GENITOURINARY: No dysuria or hematuria.  SKIN: No rashes.  MUSCULOSKELETAL: No arthritis or gout.  NEUROLOGIC: Denies focal weakness, numbness or stroke.  PSYCHIATRIC: Denies anxiety or insomnia.   PHYSICAL EXAMINATION: VITAL SIGNS: Temperature on arrival 97.8, pulse 87, respiratory 22, blood pressure 116/55, O2 sat 100% on oxygen. Currently she is 97% on room air.  GENERAL: A well-developed Caucasian female alert, lying in bed in no obvious distress.  HEENT: Normocephalic, atraumatic. Pupils are equal and reactive. Anicteric sclerae. Extraocular muscles intact. Dry mucous membranes.  NECK: Supple. No thyroid tenderness. No JVD. No cervical lymphadenopathy.  CARDIOVASCULAR: S1, S2.  Has some irregular beats. No significant murmur. No rubs or gallops.  LUNGS: Good air entry bilaterally. Some scattered crackles at the bases.  ABDOMEN: Soft, nontender,  nondistended. Positive bowel sounds in all quadrants.  EXTREMITIES: No significant lower extremity edema.  SKIN: No obvious rashes.  NEUROLOGIC: Cranial nerves II through XII are grossly intact. Strength is 5 out of 5 in all extremities. Sensation intact to light touch.  PSYCHIATRIC: Awake, alert, oriented x 3. Cooperative, pleasant.   LABORATORY AND RADIOLOGICAL DATA:  EKG shows sinus rhythm with PVCs at a rate of 95, some T wave inversions V1 to V6, prolonged QT wave, some residual ST elevations on V2, V3 from her previous heart attack.  X-ray of the chest performed is not updated in the chart.   Glucose 22, BUN 27, creatinine 1.61.  Of note, it was 1.56 on May 12th. Sodium 136, potassium 3.7, GFR of 29. LFTs show albumin of 2.5, bilirubin 1.3, AST 42, ALT 20.  Troponin is 1.6.  It is coming down from 72 on May 10th.  CK-MB of 2.1, CK total of 81. WBC 12.9, hemoglobin 11.4, platelets 301. Urinalysis on May 11th had shown Enterococcus faecalis and strep viridans UTI sensitive to Levaquin.   ASSESSMENT AND PLAN: We have a pleasant 81 year old Caucasian female with a history of hypertension and non-ST elevation myocardial infarction, status post proximal LAD drug-eluting stent on May 9th with development of acute systolic congestive heart failure afterwards in the setting of the MI  with paroxysmal atrial fibrillation who was discharged to a facility, presents back with shortness of breath. The patient, although does have elevated BNP and is short of breath, does not appear to be grossly volume overloaded at this point.  She is on room air, has no significant lower extremity edema; and although BNP is elevated, it is in line with the previous BNP. At this point, she has been given 40 mg of IV Lasix, and given her GFR of 29, I would not give her more but admit her to telemetry, monitor her ins and outs and cycle her troponins and CK-MB. She has no chest pain now. We would obtain a cardiology consult for  tomorrow, continue her beta blocker, aspirin, statin and ticagrelor  for her stent. We would monitor her on telemetry and defer diuretics at this point. We will start her on nitro paste and add p.r.n. morphine.   In regards to the renal failure, it appears to be in line with her previous creatinines. We have to be careful with overdiuresis at this point. I do not know if this is going to be her new baseline in terms of GFR, but currently she appears to be in CKD, stage IV. Low-dose ACE inhibitor or an ARB should be added if the creatinine is stable for her cardiomyopathy and the low EF if GFR tolerates. She did have a UTI and is on Levaquin, which I would continue with a lower dose. She does have an elevated white count, but it is overall trending down. It could be stress reaction versus from the UTI. We would monitor her in a remote tele bed. The patient will be started on a PPI.   CODE STATUS:  The patient is DNR by choice.     TOTAL TIME SPENT: 50 minutes.  ____________________________ Vivien Presto, MD sa:cb D: 03/11/2013 17:59:01 ET T: 03/11/2013 18:12:21 ET JOB#: [id number]  cc: Vivien Presto, MD, <Dictator> Minna Merritts, MD Eduard Clos Gilford Rile, MD Vivien Presto MD ELECTRONICALLY SIGNED 03/31/2013 12:27

## 2015-02-18 NOTE — Consult Note (Signed)
General Aspect Linda Hart is a very pleasant 81 year old woman with PMHx s/f CAD (NSTEMI 03/06/13 s/p DES-LAD), PAF (in setting of NSTEMI and reperfusion), ischemic cardiomyopathy (EF 30-35% on 03/09/13 echo), SVT (self-limited on OP monitor), Parkinson's disease (questionable), remote h/o lightheadedness and syncope who was re-admitted to [facility] with worsening DOE/SOB.   She was admitted 03/05/13 with NSTEMI. Prior to cath, she did have a hypertensive episode with acute systolic CHF responsive to Lasix, O2, morphine and NTG. She underwent cardiac catheterization revealing tubular 90% prox extending to mid LAD stenosis s/p DES, mild atheresclerosis in LCx, RCA. Post-cath echo showed EF 30-35%, severe anterior, septal and apical HK, mild TR/MR, mildly elevated EF described as reduced. DAPT- ASA/Brilinta x 12 months recommended. She recovered post-cath. Cr did bump to 1.5-1.6 suspected to be secondary to contrast induced precluding the addition of ACEi/ARB. Limited repeat echo indicated LVEF 30-35%. She was discharged on ASA, Brilinta, BB, statin, NTG SL PRN. She declined LifeVest. Close follow-up was scheduled.   Present Illness She had been recovering well at her rehab facility. She had a good day prior to admission. Her appetite had increased. She had called the office complaining of new burping/belching and chest burning. PPI was prescribed which improved this dramatically. She ate breakfast yesterday morning (toast and eggs), and shortly after experiencing chest tightness and shortness of breath. She reports medication compliance (particularly with ASA, Brilinta and Lasix). Denies increased salt intake. No increased fluids. BPs have been low/labile outpatient. She does not note sudden weight increase. No progressive SOB/DOB, PND, orthopnea, LE edema, palpitations, lightheadedness or syncope. No unilateraly leg pain, redness or swelling. No urinary changes, fevers, chills or n/v/d.   In the ED, EKG revealed  anterior TWIs (evolved post-NSTEMI), PVCs, inferior Q waves, no new ischemic changes. Troponin-I 1.60->1.30->1.10. BNP 34711. CBC- WBC 12.9 (diagnosed with UTI last admission). LFTs- AST mildly elevated otherwise unremarkable. Cr 1.61->1.51 with overnight hydration. Portable CXR- small left pleural effusion with pleural thickening. Continued on home dose PO Lasix for concern with kidneys. No I/O information.   PAST MEDICAL HISTORY:  1.  History of hypertension.  2.  History of vitamin B12 deficiency. 3.  Although Parkinson's is in her records, she denies having Parkinson's, as well as her daughter who is in the room denies Parkinson's disease.  4.  The patient had a recent non-ST elevation MI requiring a drug-eluting stent to proximal LAD.  5.  Paroxysmal a-fib during the last hospitalization.  6.  Acute systolic CHF with ejection fraction of 30% to 35% in the last admission.   PAST SURGICAL HISTORY: None.   SOCIAL HISTORY: She lives at a retirement community, fully independent in her day-to-day activities.  Denies any smoking, drinking or illegal drug use.  A retired Optometrist.   FAMILY HISTORY: Father had MI at 20 years of age.   Physical Exam:  GEN no acute distress, thin   HEENT pale conjunctivae   NECK supple  No masses  trachea midline   RESP normal resp effort  clear BS  no use of accessory muscles  no appreciable wheezing, rales or rhonchi   CARD Regular rate and rhythm  Normal, S1, S2  No murmur   ABD denies tenderness  soft  normal BS   EXTR negative cyanosis/clubbing, negative edema   SKIN normal to palpation   NEURO follows commands, motor/sensory function intact   PSYCH alert, A+O to time, place, person   Review of Systems:  Subjective/Chief Complaint shortness of breath, chest  tightness   General: No Complaints   Respiratory: Short of breath   Cardiovascular: Tightness   Gastrointestinal: Heartburn   Genitourinary: No Complaints   Musculoskeletal:  Muscle or joint pain   Neurologic: No Complaints   Home Medications: Medication Instructions Status  atorvastatin 20 mg oral tablet 1 tab(s) orally once a day (at bedtime) Active  aspirin 81 mg oral delayed release tablet 1 tab(s) orally once a day Active  ticagrelor 90 mg oral tablet 1 tab(s) orally every 12 hours Active  carvedilol 6.25 mg oral tablet 1 tab(s) orally 2 times a day Active  furosemide 40 mg oral tablet 1 tab(s) orally once a day Active  cyanocobalamin 1000 mcg/mL injectable solution 1 milliliter(s) injectable once a month Active  Protonix 40 mg oral delayed release tablet 1 tab(s) orally 2 times a day Active  promethazine 25 mg oral tablet 1 tab(s) orally every 4 hours, As Needed - for Nausea, Vomiting Active   Lab Results:  Routine Chem:  15-May-14 06:25   Result Comment TROPONIN - RESULTS VERIFIED BY REPEAT TESTING.  - PREV. C/ 03-11-13 @1518  BY LJW.Marland KitchenAJO  Result(s) reported on 12 Mar 2013 at 07:39AM.  Glucose, Serum 91  BUN  27  Creatinine (comp)  1.51  Sodium, Serum  135  Potassium, Serum 3.6  Chloride, Serum 102  CO2, Serum 23  Calcium (Total), Serum  8.3  Anion Gap 10  Osmolality (calc) 275  eGFR (African American)  36  eGFR (Non-African American)  31 (eGFR values <62m/min/1.73 m2 may be an indication of chronic kidney disease (CKD). Calculated eGFR is useful in patients with stable renal function. The eGFR calculation will not be reliable in acutely ill patients when serum creatinine is changing rapidly. It is not useful in  patients on dialysis. The eGFR calculation may not be applicable to patients at the low and high extremes of body sizes, pregnant women, and vegetarians.)  Magnesium, Serum 1.9 (1.8-2.4 THERAPEUTIC RANGE: 4-7 mg/dL TOXIC: > 10 mg/dL  -----------------------)  Cardiac:  15-May-14 06:25   CK, Total 78  CPK-MB, Serum 1.4 (Result(s) reported on 12 Mar 2013 at 07:26AM.)  Troponin I  1.10 (0.00-0.05 0.05 ng/mL or less: NEGATIVE   Repeat testing in 3-6 hrs  if clinically indicated. >0.05 ng/mL: POTENTIAL  MYOCARDIAL INJURY. Repeat  testing in 3-6 hrs if  clinically indicated. NOTE: An increase or decrease  of 30% or more on serial  testing suggests a  clinically important change)  Routine Hem:  15-May-14 06:25   WBC (CBC)  12.1  RBC (CBC) 4.04  Hemoglobin (CBC) 12.0  Hematocrit (CBC) 35.6  Platelet Count (CBC) 334  MCV 88  MCH 29.6  MCHC 33.6  RDW 12.9  Neutrophil % 73.4  Lymphocyte % 11.1  Monocyte % 12.7  Eosinophil % 2.2  Basophil % 0.6  Neutrophil #  8.9  Lymphocyte # 1.3  Monocyte #  1.5  Eosinophil # 0.3  Basophil # 0.1 (Result(s) reported on 12 Mar 2013 at 06:51AM.)   EKG:  Interpretation NSR, TWIs V1-V3, PVC, Q wave III, aVF, no ST/T changes   EKG Comparision Changed from  03/06/13    Penicillin: Anaphylaxis  Vital Signs/Nurse's Notes: **Vital Signs.:   15-May-14 07:33  Pulse Pulse 93  Systolic BP Systolic BP 1092 Diastolic BP (mmHg) Diastolic BP (mmHg) 59  Mean BP 81  Pulse Ox % Pulse Ox % 94  Oxygen Delivery Room Air/ 21 %    Impression Linda Hart a very pleasant  81 year old woman with PMHx s/f CAD (NSTEMI 03/06/13 s/p DES-LAD), PAF (in setting of NSTEMI and reperfusion), ischemic cardiomyopathy (EF 30-35% on 03/09/13 echo), SVT (self-limited on OP monitor), Parkinson's disease (questionable), remote h/o lightheadedness and syncope who was re-admitted to [facility] with worsening DOE/SOB.   1) CAD/ recent NSTEMI Compliant with ASA/Brilinta. Trop-I appropriately down-trending from recent NSTEMI.  -- Continue ASA, Brilinta, BB, statin, NTG SL PRN -- Hold on ACEi/ARB/spiro with reduced renal function 2) Acute on chronic systolic CHF/ischemic cardiomyopathy Patient admitted after experiencing an episode of chest tightness and shortness of breath at her short term rehab facility. This may be in the setting of recent NSTEMI, over-exertion at facility, increased salt intake or conservative  diuretic regimen. BNP markedly elevated on admission. CXR with left pleural effusion. Her breathing is back to baseline this AM. No chest pain. Troponins appropriately down-trending. Lungs clear. No evidence of JVD or peripheral edema.  -- Will increase Lasix to 22m BID -- Add KCl 20 mEq BID -- Reduced Coreg to 3.125 mg to allow room for increased Lasix, and given endorsement of low BPs outpatient -- Hold on ACEi/ARB/spiro with CKD, will re-evaluate renal function Monday (see below) -- Declined LifeVest- will recheck 2D echo in 3 months after titrating beneficial CHF meds to evaluate EF -- Stable from cardiac perspective for discharge. Will plan to see her in the office 03/17/13 at 11:30 AM as previously scheduled for follow-up. Plan to have BMP checked at facility on Monday and faxed to our office.  -- Re-emphasized importance of salt/fluid restriction, monitoring daily weights and symptoms 3) PAF Maintaining NSR this admission 4) H/o SVT No evidence of this on telemetry   Electronic Signatures for Addendum Section:  AKathlyn Sacramento(MD) (Signed Addendum 15-May-14 18:19)  The patient was seen and examined. Agree with above. Dyspnea likely due to acute on chronic heart failure. She seems better this morning. She is going to have a slow recovery post recent large MI. Recommend increasing Lasix to 40 mg bid. Check BMP on Monday. Follow up with [facility] week. Ok to dc back to rehab.   Electronic Signatures: AMeriel Pica(PA-C)  (Signed 15-May-14 09:16)  Authored: General Aspect/Present Illness, History and Physical Exam, Review of System, Home Medications, Labs, EKG , Allergies, Vital Signs/Nurse's Notes, Impression/Plan AKathlyn Sacramento(MD)  (Signed 15-May-14 18:19)  Co-Signer: General Aspect/Present Illness, Home Medications, Labs, EKG , Allergies, Vital Signs/Nurse's Notes, Impression/Plan   Last Updated: 15-May-14 18:19 by AKathlyn Sacramento(MD)

## 2015-02-18 NOTE — Discharge Summary (Signed)
PATIENT NAME:  Linda Hart, Linda Hart MR#:  [id number] DATE OF BIRTH:  February 23, 1925  DATE OF ADMISSION:  03/11/2013 DATE OF DISCHARGE:  03/12/2013  DISCHARGE DIAGNOSIS: Chest tightness and shortness of breath likely due to acute on chronic systolic heart failure/ischemic cardiomyopathy in the setting of recent non-ST elevation myocardial infarction. Likely overexertion at the facility with increased salt intake and/or conservative diuretic regimen. The patient's BNP was markedly elevated on admission with chest x-ray showing left-sided pleural effusion. After diuresis, her breathing is now back to baseline. No further chest pain, and troponins are appropriately down trending. She is back to baseline.   SECONDARY DIAGNOSES: 1.  Hypertension.  2.  Vitamin B12 deficiency.  3.  Parkinson's disease.  4.  History of recent myocardial infarction.  5.  Paroxysmal atrial fibrillation.  6.  History of systolic heart failure with ejection fraction of 30 to 35%.   CONSULTATION: Cardiology, Dr. Fletcher Anon.   PROCEDURES/RADIOLOGY: Chest x-ray on 14th of May showed findings concerning for small left pleural effusion versus pleural thickening.   MAJOR LABORATORY PANEL: None.   HISTORY AND SHORT HOSPITAL COURSE: The patient is an 81 year old female with the above-mentioned medical problems who was admitted for shortness of breath and chest pain thought to be secondary to acute systolic heart failure. Please see Dr. Evie Lacks dictated history and physical for further details. Cardiology consultation was obtained with Dr. Fletcher Anon who agreed with increasing her Lasix and discharging her back to her facility as she was close to her baseline.  The patient  felt great on the 15th of May and was discharged back to her facility in stable condition.    PHYSICAL EXAMINATION: VITAL SIGNS: On the date of discharge, temperature is 98.2, heart rate 92 per minute, respirations 18 per minute, blood pressure 125/59 mmHg.  She was saturating  94% on room air.   PERTINENT DISCHARGE PHYSICAL EXAMINATION:   CARDIOVASCULAR: S1, S2 normal. No murmurs, rubs or gallops.  LUNGS: Clear to auscultation bilaterally. No wheezing, rales, rhonchi or crepitation.  ABDOMEN: Soft, benign.  NEUROLOGIC: Nonfocal examination.  All other physical examination remained at baseline.   DISCHARGE MEDICATIONS: 1.  Atorvastatin 20 mg p.o. at bedtime.  2.  Aspirin 81 mg p.o. daily.  3.  Brilinta 90 mg p.o. b.i.d.  4.  Cyanocobalamin 1000 mcg, 1 mL injection once a month.  5.  Protonix 40 mg p.o. b.i.d.  6.  Robitussin 25 mg p.o. every 4 hours as needed.  7.  Coreg 3.125 mg p.o. b.i.d.  8.  Lasix 40 mg p.o. b.i.d.  9.  Potassium chloride 20 mEq p.o. b.i.d.   DISCHARGE DIET: Low sodium, low fat, low cholesterol.   DISCHARGE ACTIVITY: As tolerated.   DISCHARGE INSTRUCTIONS AND FOLLOWUP:   1.  The patient was instructed to follow with her primary care physician, Dr. Ronette Deter, in 2 to 4 weeks.   2.  She was instructed to follow up with Dr. Rosary Lively. Fletcher Anon with [facility] Cardiology on the 20th of May 2014 at 11:30 a.m. as scheduled.  3.  She was also requested to get CBC and basic metabolic panel check on Monday, on the 19th of  May, with results forwarded to primary care physician and [facility] for monitoring.       TOTAL TIME DISCHARGING THIS PATIENT: 55 minutes.    ____________________________ Lucina Mellow. Manuella Ghazi, MD vss:cb D: 03/15/2013 09:27:39 ET T: 03/15/2013 20:36:19 ET JOB#: [id number]  cc: Fabianna Keats S. Manuella Ghazi, MD, <Dictator> Eduard Clos. Gilford Rile, MD Minna Merritts,  MD Mertie Clause. Fletcher Anon, [place] MD ELECTRONICALLY SIGNED 03/16/2013 8:57

## 2015-02-18 NOTE — Discharge Summary (Signed)
PATIENT NAME:  Linda Hart, Linda Hart MR#:  [id number] DATE OF BIRTH:  1925/04/05  DATE OF ADMISSION:  03/05/2013  DATE OF DISCHARGE:  03/09/2013  DISCHARGE DIAGNOSES: 1.  Non-ST elevation myocardial infarction, status post cardiac cath and stenting to proximal/mid left anterior descending for 90% lesion, on aspirin, statin and carvedilol, with echo showing ejection fraction 30% to 35%, with severe anterior wall septal and apical wall hypokinesis, mildly elevated right ventricular systolic pressure. Repeat echo confirming the same ejection fraction before discharge. The patient does not want LifeVest for time being. 2.  Acute renal failure, resolved with hydration. Likely prerenal in nature. 3.  Acute systolic heart failure, likely secondary to myocardial infarction, with hypertension precluding use of ACE inhibitor or ARB. 4. Paroxysmal atrial fibrillation, likely due to ischemic or reperfusion arrhythmias, maintaining normal sinus rhythm with occasional ectopy before discharge on appropriate medications. 5.  Enterococcus faecalis urinary tract infection, treated.  SECONDARY DIAGNOSES: 1.  Hypertension. 2.  Vitamin B12 deficiency. 3.  Parkinson's disease.  CONSULTATION:  Cardiology, Dr. Revonda Standard.   PROCEDURE/RADIOLOGY:  Cardiac cath on May 9 by Dr. Rockey Situ, with drug-eluting stent placement in the proximal LAD. Severe proximal LAD disease, with a long lesion. Diffuse 90% stenosis in proximal LAD. Mild atherosclerosis of all vessels.   Chest x-ray on May 14 shows findings concerning for small left pleural effusion versus pleural thickening.  A 2-D echocardiogram on May 9 showed LV ejection fraction of 30% to 35%, with moderate to severely reduced global LV systolic function. Severe mid distal anteroseptal, mid distal inferior and apical hypokinesis. Mild concentric LVH. Mildly dilated left atrium. Thickening of the anterior and posterior mitral wall leaflet. Moderate mitral valve regurgitation.    A repeat echocardiogram, original echocardiogram was May 9 which had earlier similar finding, with EF of 30% to 35%, everything else was the same.  LABORATORY PANEL:  UA on admission showed 1+ bacteria, 57 WBCs, 3+ leukocyte esterase. Major laboratory panel, urine culture grew more than 100,000 colonies of Enterococcus faecalis and 30,000 colonies of Strep viridans.  HISTORY AND SHORT HOSPITAL COURSE:  The patient is an 81 year old female with the above-mentioned medical problems, admitted for non-ST elevation MI. She was started on a heparin drip. Cardiology consultation was obtained with Dr. Rockey Situ, who recommended a 2-D echocardiogram and cardiac cath, which was performed on May 9, with results listed above.  She was monitored subsequently, considering her significantly low ejection fraction through evaluation of her LV function. She underwent repeat echocardiogram on May 12, which showed essentially unchanged EF. The patient was symptomatically feeling much better. She was on for LifeVest, but she did not want that for now. She was ambulated and was doing much better. She was found to have UTI, which was treated. Acute renal failure was also resolved with IV hydration. She was also found to have acute systolic heart failure, likely due to her acute MI, for which her ACE inhibitor was held, considering renal failure. She also had paroxysmal atrial fibrillation, likely due to her ischemic reperfusion arrhythmias, but was found to be in normal sinus rhythm before discharge. She was discharged on aspirin.  She was feeling much better on May 12, and was symptom-free, and was discharged home in stable condition. The patient was discharged to rehab, as per recommendation of Physical Therapy. On the date of discharge, vital signs were as follows:  Temperature 98.1, heart rate 96 per minute, respirations 20 per minute, blood pressure 126/70 mmHg. She was saturating 96% on room air.  PERTINENT PHYSICAL  EXAMINATION ON THE DATE OF DISCHARGE:   CARDIOVASCULAR:  S1, S2 normal. No murmurs, rubs or gallops. LUNGS:  Clear to auscultation bilaterally. No wheezing, rales or crepitation. ABDOMEN:  Soft, benign. NEUROLOGIC:  Nonfocal examination.  All other Physical Examination remained at baseline.  DISCHARGE MEDICATIONS:  Carbidopa/levodopa 25/100, 1 tablet p.o. b.i.d., vitamin B12, 1000 mcg 1 injection once a month, atorvastatin 20 mg p.o. p.r.n. chest pain, aspirin 81 mg p.o. daily, ticagrelor 90 mg p.o. b.i.d., Coreg 6.25 mg p.o. b.i.d.,                                                                                               Lasix 40 mg p.o. daily, Levaquin 250 mg p.o. daily for 5 days.  DISCHARGE DIET:  Low sodium, low fat, low cholesterol, renal diet.  DISCHARGE ACTIVITY:  As tolerated.  DISCHARGE INSTRUCTION/FOLLOW UP:  The patient was instructed to follow up with her primary care physician, Dr. Ronette Deter, in 1 to 2 weeks. She will follow with Dr. Rockey Situ from Cardiology in 1 to 2 weeks. She will set up to get physical therapy evaluation while at the facility.   Total time spent discharging this patient was 55 minutes.    ____________________________ Lucina Mellow. Manuella Ghazi, MD vss:mr D: 03/13/2013 14:54:00 ET T: 03/13/2013 21:43:50 ET JOB#: [id number]  cc: Aadon Gorelik S. Manuella Ghazi, MD, <Dictator> Eduard Clos. Gilford Rile, MD Minna Merritts, MD   Lucina Mellow [facility] MD ELECTRONICALLY SIGNED 03/16/2013 8:55

## 2015-02-18 NOTE — H&P (Signed)
PATIENT NAME:  Linda Hart, Linda Hart MR#:  [id number] DATE OF BIRTH:  1925-07-04  DATE OF ADMISSION:  03/05/2013  PRIMARY CARE PHYSICIAN: Dr. Gilford Rile ER REFERRING PHYSICIAN: Graciella Freer, MD    CHIEF COMPLAINT: Chest pain.   HISTORY OF PRESENT ILLNESS: The patient is an 81 year old female, very jolly and healthy for her age, living at senior retirement community with her friends. For the last few days she has complained of on and off chest pain which is not related to any activities,  lasts for a few minutes and mild in nature, pressure-type.  Today while she was with all her friends for dinner, at the dinner table she was not eating anything and complained of severe chest pain which was not going away and which is not like her pain in the past; so, her friends brought her to the Emergency Room. On initial work-up in the ER, she was found having elevated troponin up to 9.05, so she was started on heparin drip and ER physician spoke to cardiologist, and she is being admitted under hospitalist care for further management.   REVIEW OF SYSTEMS:  CONSTITUTIONAL: Negative for fever, fatigue, weakness, pain.  EYES: No blurring, double vision, pain or redness.  EARS, NOSE, THROAT: No tinnitus, ear pain, hearing loss.  RESPIRATORY: No cough, wheezing, hemoptysis or dyspnea.  CARDIOVASCULAR: Has some chest pain but no orthopnea, edema or arrhythmia.  GASTROINTESTINAL: No nausea, vomiting, diarrhea or abdominal pain.  GENITOURINARY: No increased frequency of the urination.  SKIN: No acne or rashes.  MUSCULOSKELETAL: No pain or swelling in the joints.  NEUROLOGICAL: No numbness, weakness, tremors or headache.  PSYCHIATRIC: No anxiety, insomnia or depression.   PAST MEDICAL HISTORY: Hypertension, and vitamin B12 deficiency and Parkinson's.   PAST SURGICAL HISTORY: None.   SOCIAL HISTORY: She lives at a retirement community, fully independent in her day-to-day activities.  Denies any smoking, drinking or  illegal drug use.  A retired Optometrist.   FAMILY HISTORY: Father had MI at 51 years of age.  HOME MEDICATIONS:  Carbidopa/levodopa 25 mg/100 mg 2 times a day and vitamin B12 1000 mcg/mL injection once a month.   PHYSICAL EXAMINATION: VITAL SIGNS: In the ER, temperature 98.8, pulse rate 100, respirations 20, blood pressure 127/79 and pulse oximetry 98 on room air.  GENERAL: Fully alert, oriented to time, place and person, in no acute distress.  HEENT: Head and neck atraumatic. Conjunctivae pink. Oral mucosa moist.  NECK: Supple. No JVD.  RESPIRATORY: Bilateral clear and equal air entry.  CARDIOVASCULAR: S1, S2 present, regular. No murmur.  ABDOMEN: Soft, nontender. Bowel sounds present. No organomegaly.  SKIN: No rashes.  JOINTS: No edema.  NEUROLOGICAL: Power 5 out of 5. Follows commands. No tremors.  PSYCHIATRIC: Does not appear in any acute psychiatric illness.   LABORATORY AND RADIOLOGICAL DATA:  Glucose 148. BNP 33,000, BUN 26, creatinine 1.62, sodium 136, potassium 4.5, chloride 105, CO2 21, lipase 101. Troponin result 9.05. WBC count 14,000. INR 1.   Chest x-ray: No acute disease of the chest. EKG: Frequent premature ventricular complexes with some ST depression in lead I.  ASSESSMENT AND PLAN: 1.  Non-ST-elevation myocardial infarction:  She was started on heparin drip by ER, also ordered nitroglycerin drip for chest pain. Pain is resolved now, so I will stop nitroglycerin drip as she is not getting any yet and no pain. The ER physician spoke to Dr. Rockey Situ, and she might get cardiac catheter tomorrow a.m.  We will admit to CCU step-down unit, continue  following troponins.  We will give her aspirin, statin and carvedilol.  2.  Parkinson's disease:  We will hold carbidopa/levodopa at this time.  3.  Acute renal failure: We do not have past result for comparison. We will give her IV fluid and Mucomyst as she might go for cardiac cath tomorrow.  4.  Elevated WBCs:  This might be due  to stress of MI.  5.  Elevated BNP: X-ray clear and no creps, may be due to MI, will follow clinically.   TOTAL CRITICAL TIME SPENT: 50 minutes.  ____________________________ Ceasar Lund Anselm Jungling, MD vgv:cb D: 03/05/2013 21:09:57 ET T: 03/05/2013 22:25:22 ET JOB#: [id number]  cc: Ceasar Lund. Anselm Jungling, MD, <Dictator> Vaughan Basta MD ELECTRONICALLY SIGNED 03/12/2013 6:23

## 2015-02-18 NOTE — Consult Note (Signed)
General Aspect Linda Hart is a very pleasant 81 year old woman with a remote history of syncope many years ago with walking, an episode of lightheadedness one year ago while at a funeral,  Patient of  Dr. Ronette Deter,  HTN, presenting with chest pain, elevated cardiac enz.   She reports not feeling well for the past two days. Poor po intake, poor energy. Some mild chest discomforty yesterday. She was not looking or feeling good and friends decided to bring her to the ER. Initial troponin of 9.   EKG in ER shows NSR with PVCs, old anterior MI, ST ABN in I and AVL  Last seen in clinic 05/2012, at which time she was doing well.  she is active at baseline, does not do regular exercise program, But she does walk a long distance in her facility on a regular basis. No significant dizziness. She currently lives at [facility]  Previous event monitor showed frequent supraventricular ectopy, rare PVCs.    She had short runs of SVT, the longest was 11 beats. She had 46 short runs. Uncertain if she was symptomatic during these short runs of SVT     She had a stress test in 2007 where she only exercised for 3 minutes, 5 METS, with normal LV systolic function by echocardiogram. Recommendation was for her to increase her exercise.   Present Illness . SOCIAL HISTORY: She lives at a retirement community, fully independent in her day-to-day activities.  Denies any smoking, drinking or illegal drug use.  A retired Optometrist.   FAMILY HISTORY: Father had MI at 56 years of age.   Physical Exam:  GEN well developed, well nourished, no acute distress   HEENT red conjunctivae   NECK supple   RESP normal resp effort  clear BS   CARD Regular rate and rhythm  No murmur   ABD denies tenderness  soft   LYMPH negative neck   EXTR negative edema   SKIN normal to palpation   NEURO motor/sensory function intact   PSYCH alert, A+O to time, place, person   Review of Systems:  Subjective/Chief  Complaint malaise, vomiting , chest pain yesterday, today feels "ok"   General: Fatigue  Weakness   Skin: No Complaints   ENT: No Complaints   Eyes: No Complaints   Neck: No Complaints   Respiratory: No Complaints   Cardiovascular: Chest pain or discomfort   Gastrointestinal: Nausea   Genitourinary: No Complaints   Vascular: No Complaints   Musculoskeletal: No Complaints   Neurologic: No Complaints   Hematologic: No Complaints   Endocrine: No Complaints   Psychiatric: No Complaints   Review of Systems: All other systems were reviewed and found to be negative   Medications/Allergies Reviewed Medications/Allergies reviewed        Admit Diagnosis:   NSTEMI 410.90 AMI: Onset Date: 06-Mar-2013, Status: Active, Description: NSTEMI 410.90 AMI  Home Medications: Medication Instructions Status  carbidopa-levodopa 25 mg-100 mg oral tablet 1 tab(s) orally 2 times a day Active  Vitamin B-12 1000 mcg/mL injectable solution 1 milliliter(s) injectable once a month Active   Lab Results:  Hepatic:  08-May-14 18:58   Bilirubin, Total 0.6  Bilirubin, Direct 0.1 (Result(s) reported on 05 Mar 2013 at 08:20PM.)  Alkaline Phosphatase 88  SGPT (ALT) 27  SGOT (AST)  95  Total Protein, Serum 6.8  Albumin, Serum 3.4  Routine Chem:  08-May-14 18:58   Glucose, Serum  148  BUN  26  Creatinine (comp)  1.62  Sodium,  Serum 136  Potassium, Serum 4.5  Chloride, Serum 105  CO2, Serum 21  Calcium (Total), Serum 8.9  Anion Gap 10  Osmolality (calc) 279  eGFR (African American)  33  eGFR (Non-African American)  29 (eGFR values <14m/min/1.73 m2 may be an indication of chronic kidney disease (CKD). Calculated eGFR is useful in patients with stable renal function. The eGFR calculation will not be reliable in acutely ill patients when serum creatinine is changing rapidly. It is not useful in  patients on dialysis. The eGFR calculation may not be applicable to patients at the low and  high extremes of body sizes, pregnant women, and vegetarians.)  Result Comment troponin - RESULTS VERIFIED BY REPEAT TESTING.  - mpg c/ sherri jones @ 1950 03/05/13  - READ-BACK PROCESS PERFORMED.  Result(s) reported on 05 Mar 2013 at 07:53PM.  Lipase 101 (Result(s) reported on 05 Mar 2013 at 08:16PM.)  B-Type Natriuretic Peptide ([facility]  [phone](Result(s) reported on 05 Mar 2013 at 07:53PM.)  Cardiac:  08-May-14 18:58   Troponin I  9.05 (0.00-0.05 0.05 ng/mL or less: NEGATIVE  Repeat testing in 3-6 hrs  if clinically indicated. >0.05 ng/mL: POTENTIAL  MYOCARDIAL INJURY. Repeat  testing in 3-6 hrs if  clinically indicated. NOTE: An increase or decrease  of 30% or more on serial  testing suggests a  clinically important change)  Routine Coag:  08-May-14 18:58   Activated PTT (APTT) 27.8 (A HCT value >55% may artifactually increase the APTT. In one study, the increase was an average of 19%. Reference: "Effect on Routine and Special Coagulation Testing Values of Citrate Anticoagulant Adjustment in Patients with High HCT Values." American Journal of Clinical Pathology 2006;126:400-405.)  Prothrombin 13.7  INR 1.0 (INR reference interval applies to patients on anticoagulant therapy. A single INR therapeutic range for coumarins is not optimal for all indications; however, the suggested range for most indications is 2.0 - 3.0. Exceptions to the INR Reference Range may include: Prosthetic heart valves, acute myocardial infarction, prevention of myocardial infarction, and combinations of aspirin and anticoagulant. The need for a higher or lower target INR must be assessed individually. Reference: The Pharmacology and Management of the Vitamin K  antagonists: the seventh ACCP Conference on Antithrombotic and Thrombolytic Therapy. CSJGGE.3662Sept:126 (3suppl): [id number] A HCT value >55% may artifactually increase the PT.  In one study,  the increase was an average of 25%. Reference:   "Effect on Routine and Special Coagulation Testing Values of Citrate Anticoagulant Adjustment in Patients with High HCT Values." American Journal of Clinical Pathology 2006;126:400-405.)  Routine Hem:  08-May-14 18:58   WBC (CBC)  14.4  RBC (CBC) 4.27  Hemoglobin (CBC) 12.6  Hematocrit (CBC) 38.7  Platelet Count (CBC) 245 (Result(s) reported on 05 Mar 2013 at 07:27PM.)  MCV 91  MCH 29.5  MCHC 32.6  RDW 13.6   EKG:  Interpretation EKG in ER shows NSR with PVCs, old anterior MI, ST ABN in I and AVL   Radiology Results: XRay:    08-May-14 19:12, Chest PA and Lateral  Chest PA and Lateral   REASON FOR EXAM:    Chest Pain  COMMENTS:       PROCEDURE: DXR - DXR CHEST PA (OR AP) AND LATERAL  - Mar 05 2013  7:12PM     RESULT: Comparison: None    Findings:     PA and lateral chest radiographs are provided. The lungs are   hyperinflated likelysecondary to COPD. There is no focal parenchymal  opacity, pleural effusion, or pneumothorax. The heart and mediastinum are   unremarkable.  The osseous structures are unremarkable.    IMPRESSION:   No acute disease of the chest.    Dictation Site: 1        Verified By: Jennette Banker, M.D., MD    Penicillin: Anaphylaxis  Vital Signs/Nurse's Notes: **Vital Signs.:   09-May-14 06:00  Vital Signs Type Routine  Pulse Pulse 86  Respirations Respirations 25  Systolic BP Systolic BP 592  Diastolic BP (mmHg) Diastolic BP (mmHg) 50  Mean BP 78  Pulse Ox % Pulse Ox % 98  Oxygen Delivery Room Air/ 21 %  Pulse Ox Heart Rate 86    Impression Linda Hart is a very pleasant 81 year old woman with a remote history of syncope many years ago with walking, an episode of lightheadedness one year ago while at a funeral,  Patient of  Dr. Ronette Deter,  HTN, presenting with chest pain, elevated cardiac enz.   1) NSTEMI: EKG shows anterior MI,  ECHO prelim shows anterior wall hypokinesis (possible akinesis),  Currently pain free --currently  ion NTG infusion and heparin infusion --plan for cardiac cath lab  this AM after recheck of renal function  2) Parkinsons on meds.   Electronic Signatures: Ida Rogue (MD)  (Signed [phone] 08:23)  Authored: General Aspect/Present Illness, History and Physical Exam, Review of System, Past Medical History, Health Issues, Home Medications, Labs, EKG , Radiology, Allergies, Vital Signs/Nurse's Notes, Impression/Plan   Last Updated: 09-May-14 08:23 by Ida Rogue (MD)

## 2015-02-23 ENCOUNTER — Other Ambulatory Visit: Payer: Self-pay | Admitting: *Deleted

## 2015-02-23 MED ORDER — ATORVASTATIN CALCIUM 20 MG PO TABS
20.0000 mg | ORAL_TABLET | Freq: Every day | ORAL | Status: DC
Start: 2015-02-23 — End: 2016-08-29

## 2015-02-23 NOTE — Telephone Encounter (Signed)
[employer] sent request for 90 day supply to be sent to pharmacy.  Rx sent to [facility]

## 2015-04-30 IMAGING — CR DG CHEST 1V PORT
1 series · 1 of 1 positions shown · non-contrast
Comparison: none

REASON FOR EXAM: Chest Pain
COMMENTS:

[ap]
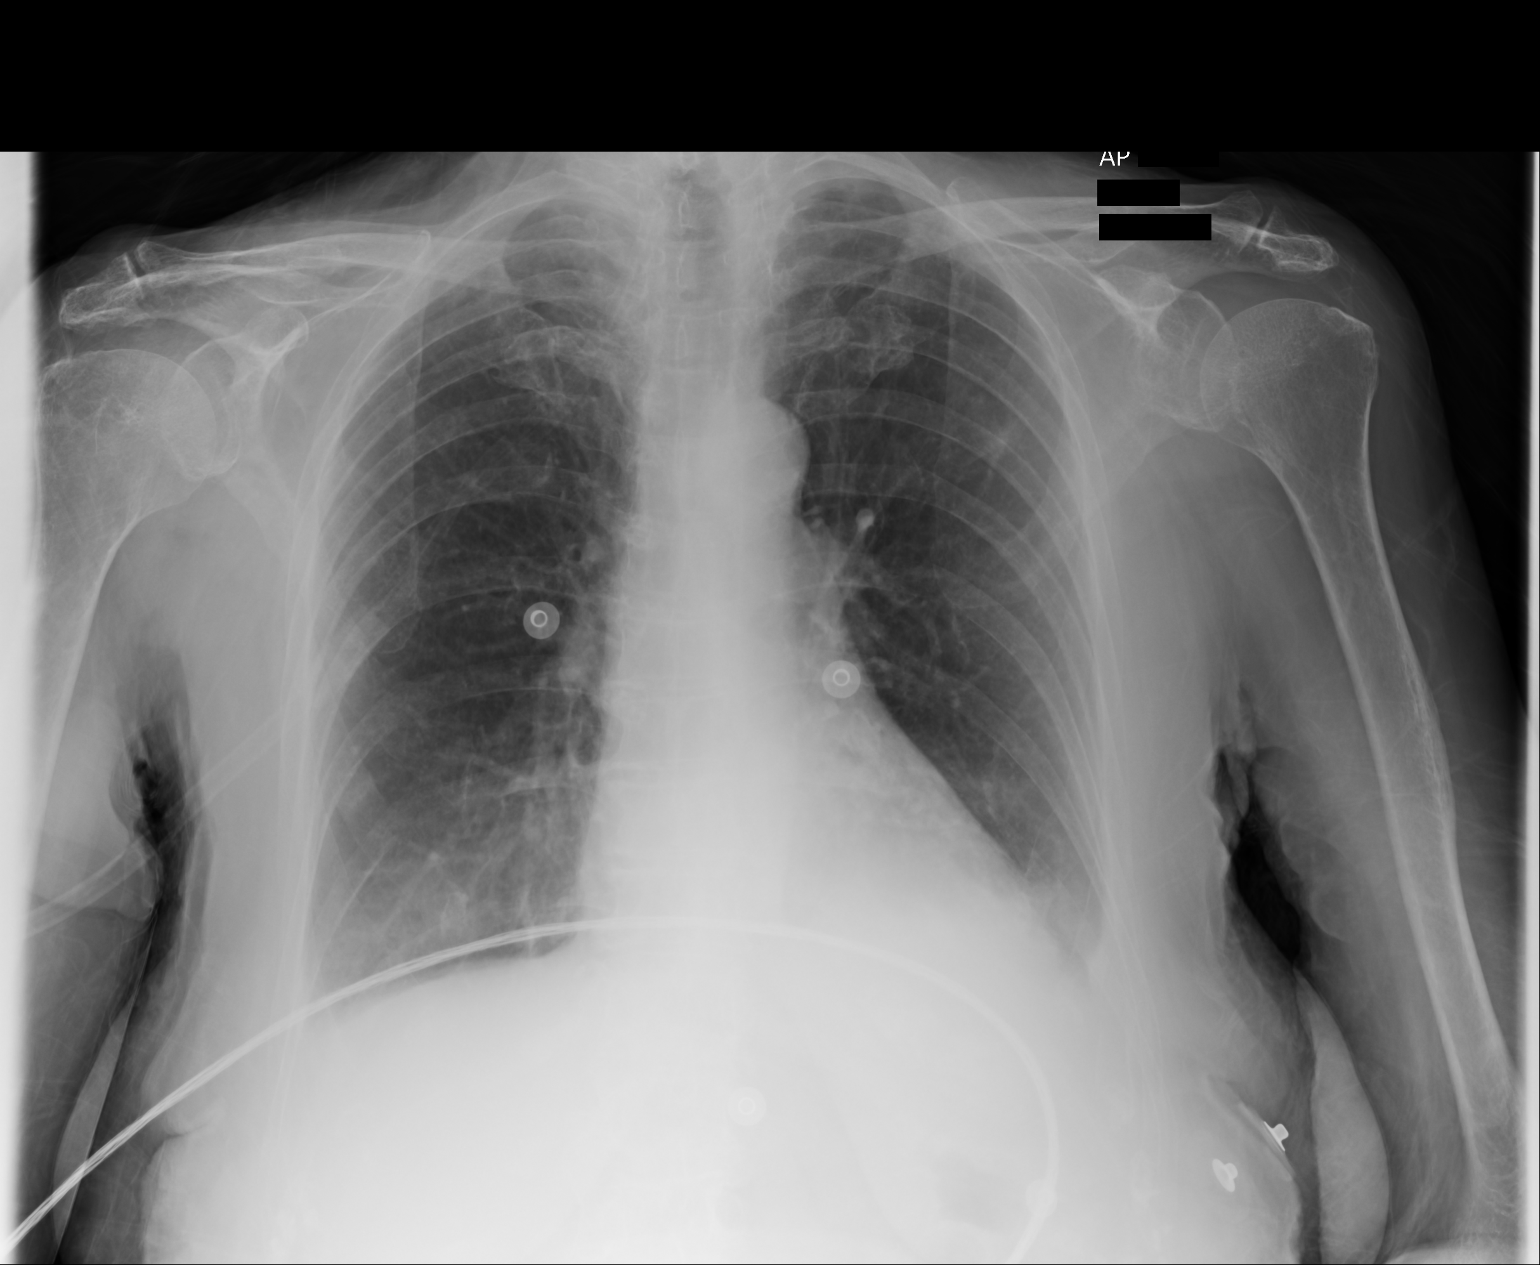

[1 of 1 positions shown; findings below may reference images not displayed]

PROCEDURE:     DXR - DXR PORTABLE CHEST SINGLE VIEW  - March 11, 2013  [DATE]

RESULT:     Comparison is made to the previous study of 06/07/2013. There
appears to be some blunting of the left costophrenic angle which could
represent pleural thickening or effusion. The cardiac silhouette is normal
in size. Bony structures appear unremarkable. There is no edema or
pneumothorax. There is a prominent bony protuberance of the anterior end of
the left first rib which is unchanged.
IMPRESSION: Findings are concerning for small left pleural effusion
versus pleural thickening.

[REDACTED]

## 2015-05-09 ENCOUNTER — Other Ambulatory Visit: Payer: Self-pay | Admitting: Internal Medicine

## 2015-07-26 ENCOUNTER — Ambulatory Visit (INDEPENDENT_AMBULATORY_CARE_PROVIDER_SITE_OTHER): Payer: Medicare Other | Admitting: Cardiovascular Disease

## 2015-07-26 ENCOUNTER — Encounter: Payer: Self-pay | Admitting: Cardiovascular Disease

## 2015-07-26 VITALS — BP 110/70 | HR 75 | Ht 60.0 in | Wt 103.2 lb

## 2015-07-26 DIAGNOSIS — I480 Paroxysmal atrial fibrillation: Secondary | ICD-10-CM

## 2015-07-26 DIAGNOSIS — I951 Orthostatic hypotension: Secondary | ICD-10-CM | POA: Insufficient documentation

## 2015-07-26 DIAGNOSIS — E785 Hyperlipidemia, unspecified: Secondary | ICD-10-CM | POA: Diagnosis not present

## 2015-07-26 DIAGNOSIS — I2510 Atherosclerotic heart disease of native coronary artery without angina pectoris: Secondary | ICD-10-CM

## 2015-07-26 DIAGNOSIS — I255 Ischemic cardiomyopathy: Secondary | ICD-10-CM

## 2015-07-26 NOTE — Progress Notes (Signed)
Patient ID: Linda Hart, female    DOB: 1925/03/14, 81 y.o.   MRN: [id number]  HPI Comments: Linda Hart is a very pleasant 81 year old woman with  PMHx s/f CAD (NSTEMI 03/06/13 s/p DES-LAD), PAF (in setting of NSTEMI and reperfusion), ischemic cardiomyopathy (EF 30-35% on 03/09/13 echo), SVT (self-limited on OP monitor), GERD, remote h/o lightheadedness and syncope who was re-admitted to [facility] with worsening DOE/SOB may 2014. She presents for routine followup from skilled nursing facility.  She lives at the [facility] She presents today for follow-up of her coronary artery disease  In follow-up today, Linda Hart reports that she is doing well. Exercising daily She lives at [facility], walks [place] 2[place]3 times every day Denies any shortness of breath or chest pain. Occasional episodes of lightheadedness when she stands up, not bad She is not eating much, does not drink much during the daytime Weight has been slowly decreasing  Review of lab work from last year shows total cholesterol 139 Daughter reports they will schedule follow-up with primary care for full set of labs EKG on today's visit shows normal sinus rhythm with no significant ST or T-wave changes  Other past medical history She was admitted 03/05/13 with NSTEMI.  cardiac catheterization revealing tubular 90% prox extending to mid LAD stenosis s/p DES, mild atheresclerosis in LCx, RCA.  Post-cath echo showed EF 30-35%, severe anterior, septal and apical HK, mild TR/MR, mildly elevated EF described as reduced. DAPT- ASA/Brilinta x 12 months recommended.  Cr did bump to 1.5-1.6 suspected to be secondary to contrast induced precluding the addition of ACEi/ARB.  Limited repeat echo indicated LVEF 30-35%. She declined LifeVest.   After discharge from the hospital, creatinine was >2,  Lasix was held.  Creatinine in June 2014 improved to 1.5.  Previous event monitor showed frequent supraventricular ectopy, rare  PVCs.   She had short runs of SVT, the longest was 11 beats. She had 46 short runs. Uncertain if she was symptomatic during these short runs of SVT  Echocardiogram may 2014 with ejection fraction 30-35%, mild to moderate pulmonary hypertension    Allergies  Allergen Reactions  . Penicillins     Rash/hives/swelling    Outpatient Encounter Prescriptions as of 07/26/2015  Medication Sig  . aspirin 81 MG tablet Take 81 mg by mouth daily.    Marland Kitchen atorvastatin (LIPITOR) 20 MG tablet Take 1 tablet (20 mg total) by mouth daily at 6 PM.  . carvedilol (COREG) 3.125 MG tablet TAKE ONE TABLET TWICE A DAY WITH MEALS.  . chlorpheniramine-HYDROcodone (TUSSIONEX) 10-8 MG/5ML LQCR Take 5 mLs by mouth every 12 (twelve) hours as needed.  . Cyanocobalamin (VITAMIN B-12 IJ) Inject as directed every 30 (thirty) days.  . furosemide (LASIX) 20 MG tablet Take 1 tablet (20 mg total) by mouth daily as needed.  . levalbuterol (XOPENEX HFA) 45 MCG/ACT inhaler Inhale 1-2 puffs into the lungs every 4 (four) hours as needed for wheezing.  Marland Kitchen loratadine (CLARITIN) 10 MG tablet Take 1 tablet (10 mg total) by mouth daily.  . nitroGLYCERIN (NITROSTAT) 0.4 MG SL tablet Place 0.4 mg under the tongue every 5 (five) minutes as needed for chest pain.  . ticagrelor (BRILINTA) 60 MG TABS tablet Take 1 tablet (60 mg total) by mouth 2 (two) times daily.   No facility-administered encounter medications on file as of 07/26/2015.    Past Medical History  Diagnosis Date  . Dizziness   . Hypotension   . Coronary artery disease  03/06/13    90% prox LAD stenosis s/p DES  . Ischemic cardiomyopathy 03/09/13    s/p NSTEMI. EF 30-35%.  Marland Kitchen GERD (gastroesophageal reflux disease)   . PAF (paroxysmal atrial fibrillation) 03/06/13    In setting of NSTEMI and reperfusion, no recurrence  . SVT (supraventricular tachycardia)     Self-limited on outpatient cardiac monitor  . MI (myocardial infarction)     Past Surgical History  Procedure  Laterality Date  . Cesarean section    . Coronary angioplasty with stent placement  02/2013    DES-mid LAD, mild LCx, RCA stenoses    Social History  reports that she has never smoked. She has never used smokeless tobacco. She reports that she does not drink alcohol or use illicit drugs.  Family History family history includes Heart attack in her father; Heart failure in her father and mother.   Review of Systems  Constitutional: Negative.   Respiratory: Negative.   Cardiovascular: Negative.   Gastrointestinal: Negative.   Musculoskeletal: Positive for gait problem.  Skin: Negative.   Neurological: Positive for light-headedness.  Hematological: Negative.   Psychiatric/Behavioral: Negative.   All other systems reviewed and are negative.  BP 110/70 mmHg  Pulse 75  Ht 5' (1.524 m)  Wt 103 lb 4 oz (46.834 kg)  BMI 20.16 kg/m2  Physical Exam  Constitutional: She is oriented to person, place, and time. She appears well-developed and well-nourished.  HENT:  Head: Normocephalic.  Nose: Nose normal.  Mouth/Throat: Oropharynx is clear and moist.  Eyes: Conjunctivae are normal. Pupils are equal, round, and reactive to light.  Neck: Normal range of motion. Neck supple. No JVD present.  Cardiovascular: Normal rate, regular rhythm, S1 normal, S2 normal, normal heart sounds and intact distal pulses.  Exam reveals no gallop and no friction rub.   No murmur heard. Pulmonary/Chest: Effort normal and breath sounds normal. No respiratory distress. She has no wheezes. She has no rales. She exhibits no tenderness.  Abdominal: Soft. Bowel sounds are normal. She exhibits no distension. There is no tenderness.  Musculoskeletal: Normal range of motion. She exhibits no edema or tenderness.  Lymphadenopathy:    She has no cervical adenopathy.  Neurological: She is alert and oriented to person, place, and time. Coordination normal.  Skin: Skin is warm and dry. No rash noted. No erythema.   Psychiatric: She has a normal mood and affect. Her behavior is normal. Judgment and thought content normal.    Assessment and Plan  Nursing note and vitals reviewed.

## 2015-07-26 NOTE — Assessment & Plan Note (Signed)
Currently with no symptoms of angina. No further workup at this time. Continue current medication regimen. 

## 2015-07-26 NOTE — Assessment & Plan Note (Signed)
Blood pressure low on today's visit. Asymptomatic. Encouraged her not to restrict her salt intake or fluid intake If she becomes symptomatic from low blood pressure when standing, could decrease carvedilol down to 1 a day

## 2015-07-26 NOTE — Patient Instructions (Signed)
You are doing well. No medication changes were made.  Drink more and Eat more!!!! Take a water bottle when walking  Please call us if you have new issues that need to be addressed before your next appt.  Your physician wants you to follow-up in: 6 months.  You will receive a reminder letter in the mail two months in advance. If you don't receive a letter, please call our office to schedule the follow-up appointment.

## 2015-07-26 NOTE — Assessment & Plan Note (Signed)
Cholesterol is at goal on the current lipid regimen. No changes to the medications were made.  

## 2015-07-26 NOTE — Assessment & Plan Note (Signed)
She appears relatively euvolemic on today's visit. Not requiring Lasix. No leg edema or abdominal bloating or significant shortness of breath with exertion

## 2015-07-26 NOTE — Assessment & Plan Note (Signed)
We'll continue current medications for now. No symptoms concerning for arrhythmia If she becomes symptomatic from low blood pressure, may need to decrease beta blocker dose down to 1 a day. This was discussed with patient

## 2015-09-12 ENCOUNTER — Telehealth: Payer: Self-pay | Admitting: Family Medicine

## 2015-09-12 ENCOUNTER — Encounter: Payer: Self-pay | Admitting: Family Medicine

## 2015-09-12 ENCOUNTER — Ambulatory Visit (INDEPENDENT_AMBULATORY_CARE_PROVIDER_SITE_OTHER): Payer: Medicare Other | Admitting: Family Medicine

## 2015-09-12 VITALS — BP 132/64 | HR 76 | Temp 98.2°F | Ht 60.0 in

## 2015-09-12 DIAGNOSIS — S8010XA Contusion of unspecified lower leg, initial encounter: Secondary | ICD-10-CM | POA: Insufficient documentation

## 2015-09-12 DIAGNOSIS — T148 Other injury of unspecified body region: Secondary | ICD-10-CM | POA: Diagnosis not present

## 2015-09-12 DIAGNOSIS — I255 Ischemic cardiomyopathy: Secondary | ICD-10-CM | POA: Diagnosis not present

## 2015-09-12 DIAGNOSIS — T148XXA Other injury of unspecified body region, initial encounter: Secondary | ICD-10-CM

## 2015-09-12 DIAGNOSIS — R040 Epistaxis: Secondary | ICD-10-CM | POA: Insufficient documentation

## 2015-09-12 DIAGNOSIS — S8012XA Contusion of left lower leg, initial encounter: Secondary | ICD-10-CM | POA: Diagnosis not present

## 2015-09-12 LAB — CBC
HEMATOCRIT: 32.3 % — AB (ref 36.0–46.0)
Hemoglobin: 10.3 g/dL — ABNORMAL LOW (ref 12.0–15.0)
MCHC: 31.8 g/dL (ref 30.0–36.0)
MCV: 85.9 fl (ref 78.0–100.0)
Platelets: 323 10*3/uL (ref 150.0–400.0)
RBC: 3.76 Mil/uL — ABNORMAL LOW (ref 3.87–5.11)
RDW: 15.2 % (ref 11.5–15.5)
WBC: 11 10*3/uL — ABNORMAL HIGH (ref 4.0–10.5)

## 2015-09-12 NOTE — Telephone Encounter (Signed)
Attempted to call patient to discuss lab work. There is no answer. Left a voicemail asking her to call back to the office. We will await her call back.

## 2015-09-12 NOTE — Progress Notes (Signed)
Pre visit review using our clinic review tool, if applicable. No additional management support is needed unless otherwise documented below in the visit note. 

## 2015-09-12 NOTE — Assessment & Plan Note (Signed)
Single brief nosebleed that resolved with minimal pressure. No obvious abnormalities on exam of external nares. Discussed this is likely due to the dry weather and dry nasal mucosa. They will do a humidifier at home. Discussed that if there are no nosebleeds in the next 1-2 weeks she can do nasal saline. We will check a CBC to ensure that her blood counts are stable. She'll continue to monitor and if these recur she'll seek medical attention. Given return precautions

## 2015-09-12 NOTE — Progress Notes (Signed)
Patient ID: Linda Hart, female   DOB: [birth year], 81 y.o.   MRN: [id number]  Linda Rumps, MD Phone: [phone]  Linda Hart is a 81 y.o. female who presents today for same-day visit.  Left lower extremity injury: Patient notes about a week and a half ago she was walking to talk to somebody and walked into a low brick wall. She noted no injury at that time. She fell and was caught by a man sitting on the wall. She did not hit her head or lose consciousness. She did not injure anywhere other than her left lower extremity. She had no pain at that time. She noted a couple days later she developed an area of swelling in the anterior aspect of the left lower extremity. This progressively got bigger and became bruised. She notes it opened on its own and she was able to get some blood out. She has not had any purulence from this area. She notes occasional mild shooting pain in her left leg in this area. She's not had any fevers or spreading redness. A nurse at her facility has been monitoring this area and placing ointment and dressings on it. She denies numbness, tingling, and weakness in her left lower extremity. She feels well and has not had any fevers.  She also notes that she had a small nosebleed this morning. She notes there was a minimal amount of blood spotting from her right nostril when she placed a tissue to it. She notes it was only spots of blood. It stopped after 10 minutes. She did not intervene to make it stop. She has had this in the past, though very infrequently. She notes no nosebleed at this time. She is on aspirin and brilanta. She is not on a NOAC or warfarin.  PMH: nonsmoker.   ROS see history of present illness  Objective  Physical Exam Filed Vitals:   09/12/15 1105  BP: 132/64  Pulse: 76  Temp: 98.2 F (36.8 C)    Physical Exam  Constitutional: She is well-developed, well-nourished, and in no distress.  HENT:  Head: Normocephalic and atraumatic.    Otoscopic exam of the nose reveals no abnormalities or bleeding  Cardiovascular: Normal rate, regular rhythm and normal heart sounds.  Exam reveals no gallop and no friction rub.   No murmur heard. Pulmonary/Chest: Effort normal and breath sounds normal. No respiratory distress. She has no wheezes. She has no rales.  Musculoskeletal:  Patient with full range of motion of her left ankle and knee, there is no tenderness of the knee, there is bruising over the anterior inferior aspect of the knee, ligaments are intact, there is tenderness surrounding the area of the hematoma, there is some bony malleolar tenderness medially and laterally in the left ankle, there is some bruising over the top of the foot, though there is no tenderness of any aspect of her foot, there is mild swelling of her ankle in the area around the hematoma, there is no calf tenderness or swelling or cords, 2+ DP pulses and feet warm and well perfused  Neurological:  5 out of 5 strength in bilateral quads, plantar flexion, and dorsiflexion, sensation to light touch is intact in bilateral feet  Skin: She is not diaphoretic.     There is mild tenderness over the area of the hematoma and the surrounding area     Assessment/Plan: Please see individual problem list.  Hematoma of left lower leg Area of injury is consistent with hematoma. This area does  not appear infected. Patient is well-appearing. She is neurovascularly intact. We'll check a CBC to ensure blood counts are stable. The wound was dressed in the office. They will continue to monitor and dress the wound at the nursing facility. We will refer her to the wound center for possible debridement. I did advise x-ray imaging of her knee, tibia/fibula, and ankle given her injury, though the patient declined this at this time opting to continue to monitor. She was given return precautions.  Nosebleed Single brief nosebleed that resolved with minimal pressure. No obvious  abnormalities on exam of external nares. Discussed this is likely due to the dry weather and dry nasal mucosa. They will do a humidifier at home. Discussed that if there are no nosebleeds in the next 1-2 weeks she can do nasal saline. We will check a CBC to ensure that her blood counts are stable. She'll continue to monitor and if these recur she'll seek medical attention. Given return precautions    Orders Placed This Encounter  Procedures  . CBC  . AMB referral to wound care center    Referral Priority:  Routine    Referral Type:  Consultation    Number of Visits Requested:  [place]

## 2015-09-12 NOTE — Patient Instructions (Signed)
Nice to meet you. We will refer you to wound care to evaluate this further. We will check a CBC to ensure her blood counts are stable. Please continue to monitor for nosebleeds. If this recurs please let us know. If you're unable to get it to stop bleeding after 15 minutes please seek medical attention. If you develop fever, redness, increased pain, persistent bleeding, or any new or change in symptoms please seek medical attention immediately.

## 2015-09-12 NOTE — Assessment & Plan Note (Addendum)
Area of injury is consistent with hematoma. This area does not appear infected. Patient is well-appearing. She is neurovascularly intact. We'll check a CBC to ensure blood counts are stable. The wound was dressed in the office. They will continue to monitor and dress the wound at the nursing facility. We will refer her to the wound center for possible debridement. I did advise x-ray imaging of her knee, tibia/fibula, and ankle given her injury, though the patient declined this at this time opting to continue to monitor. She was given return precautions.

## 2015-09-13 NOTE — Telephone Encounter (Signed)
Called patient and informed her of lab results. Her hemoglobin is slightly lower than previously. This may be due to bleeding from the injury that she had. Her white blood cell count is mildly elevated. This could be a marker of inflammation or stress in the body from her injury. I doubt that this could be related to infection given that the area did not appear infected yesterday. Patient reports she feels well today. She's not had any fevers, chills, or increasing pain. She notes she has been able to walk on the leg with ease. She notes the nurse is going to look at around 1:30. Did discuss having her come back in for repeat blood work to ensure that the white blood cell count is stable or decreased, and she asked that we call her daughter to set this up. She was given return precautions.

## 2015-09-15 NOTE — Telephone Encounter (Signed)
Noted  

## 2015-09-15 NOTE — Telephone Encounter (Signed)
Spoke with patients daughter and she is going to bring her mom Monday for repeat lab for CBC

## 2015-09-15 NOTE — Telephone Encounter (Signed)
Can you call the patients daughter to see if they can bring the patient in for a lab appointment for repeat CBC? Thanks.

## 2015-09-16 ENCOUNTER — Ambulatory Visit: Payer: Medicare Other | Admitting: Surgery

## 2015-09-19 ENCOUNTER — Other Ambulatory Visit (INDEPENDENT_AMBULATORY_CARE_PROVIDER_SITE_OTHER): Payer: Medicare Other

## 2015-09-19 DIAGNOSIS — I10 Essential (primary) hypertension: Secondary | ICD-10-CM

## 2015-09-19 LAB — COMPREHENSIVE METABOLIC PANEL
ALK PHOS: 99 U/L (ref 39–117)
ALT: 12 U/L (ref 0–35)
AST: 22 U/L (ref 0–37)
Albumin: 3.4 g/dL — ABNORMAL LOW (ref 3.5–5.2)
BILIRUBIN TOTAL: 0.9 mg/dL (ref 0.2–1.2)
BUN: 25 mg/dL — AB (ref 6–23)
CO2: 24 mEq/L (ref 19–32)
CREATININE: 1.37 mg/dL — AB (ref 0.40–1.20)
Calcium: 8.7 mg/dL (ref 8.4–10.5)
Chloride: 104 mEq/L (ref 96–112)
GFR: 38.67 mL/min — AB (ref >60.00)
GLUCOSE: 99 mg/dL (ref 70–99)
Potassium: 4.3 mEq/L (ref 3.5–5.1)
SODIUM: 135 meq/L (ref 135–145)
TOTAL PROTEIN: 6.3 g/dL (ref 6.0–8.3)

## 2015-09-19 LAB — LIPID PANEL
CHOL/HDL RATIO: 2
Cholesterol: 120 mg/dL (ref 0–200)
HDL: 59.6 mg/dL (ref >39.00)
LDL Cholesterol: 48 mg/dL (ref 0–99)
NONHDL: 59.96
Triglycerides: 60 mg/dL (ref 0.0–149.0)
VLDL: 12 mg/dL (ref 0.0–40.0)

## 2015-09-20 ENCOUNTER — Other Ambulatory Visit: Payer: Self-pay | Admitting: Internal Medicine

## 2015-09-20 ENCOUNTER — Telehealth: Payer: Self-pay | Admitting: Internal Medicine

## 2015-09-20 LAB — CBC WITH DIFFERENTIAL/PLATELET
BASOS ABS: 0.1 10*3/uL (ref 0.0–0.1)
Basophils Relative: 0.8 % (ref 0.0–3.0)
EOS ABS: 0.4 10*3/uL (ref 0.0–0.7)
Eosinophils Relative: 3.6 % (ref 0.0–5.0)
HCT: 32.2 % — ABNORMAL LOW (ref 36.0–46.0)
Hemoglobin: 10.4 g/dL — ABNORMAL LOW (ref 12.0–15.0)
LYMPHS ABS: 1.5 10*3/uL (ref 0.7–4.0)
Lymphocytes Relative: 12.1 % (ref 12.0–46.0)
MCHC: 32.5 g/dL (ref 30.0–36.0)
MCV: 85.1 fl (ref 78.0–100.0)
MONO ABS: 1.1 10*3/uL — AB (ref 0.1–1.0)
MONOS PCT: 9 % (ref 3.0–12.0)
NEUTROS ABS: 9.2 10*3/uL — AB (ref 1.4–7.7)
NEUTROS PCT: 74.5 % (ref 43.0–77.0)
PLATELETS: 324 10*3/uL (ref 150.0–400.0)
RBC: 3.78 Mil/uL — AB (ref 3.87–5.11)
RDW: 14.6 % (ref 11.5–15.5)
WBC: 12.3 10*3/uL — ABNORMAL HIGH (ref 4.0–10.5)

## 2015-09-20 NOTE — Telephone Encounter (Signed)
Pt daughter called to check the status of the lab work that was done on 09/19/2015. Thank you!

## 2015-09-20 NOTE — Telephone Encounter (Signed)
Labs ordered by Dr. Caryl Bis were relatively stable.

## 2015-09-21 ENCOUNTER — Encounter: Payer: Medicare Other | Attending: Surgery | Admitting: Surgery

## 2015-09-21 DIAGNOSIS — I2510 Atherosclerotic heart disease of native coronary artery without angina pectoris: Secondary | ICD-10-CM | POA: Insufficient documentation

## 2015-09-21 DIAGNOSIS — I252 Old myocardial infarction: Secondary | ICD-10-CM | POA: Insufficient documentation

## 2015-09-21 DIAGNOSIS — R600 Localized edema: Secondary | ICD-10-CM | POA: Insufficient documentation

## 2015-09-21 DIAGNOSIS — L97222 Non-pressure chronic ulcer of left calf with fat layer exposed: Secondary | ICD-10-CM | POA: Diagnosis not present

## 2015-09-21 DIAGNOSIS — S81802A Unspecified open wound, left lower leg, initial encounter: Secondary | ICD-10-CM | POA: Insufficient documentation

## 2015-09-21 DIAGNOSIS — I10 Essential (primary) hypertension: Secondary | ICD-10-CM | POA: Insufficient documentation

## 2015-09-21 DIAGNOSIS — Z955 Presence of coronary angioplasty implant and graft: Secondary | ICD-10-CM | POA: Insufficient documentation

## 2015-09-21 DIAGNOSIS — M1990 Unspecified osteoarthritis, unspecified site: Secondary | ICD-10-CM | POA: Insufficient documentation

## 2015-09-21 DIAGNOSIS — E7800 Pure hypercholesterolemia, unspecified: Secondary | ICD-10-CM | POA: Insufficient documentation

## 2015-09-21 DIAGNOSIS — X58XXXA Exposure to other specified factors, initial encounter: Secondary | ICD-10-CM | POA: Insufficient documentation

## 2015-09-21 NOTE — Telephone Encounter (Signed)
Okay to refill? Previously refilled by [facility]

## 2015-09-22 NOTE — Progress Notes (Signed)
VIANNA, [name] ([id number]) Visit Report for 09/21/2015 Abuse/Suicide Risk Screen Details Patient Name: Linda Hart, Linda Hart Date of Service: 09/21/2015 8:45 AM Medical Record Patient Account Number: [id number] Number: Treating RN: Baruch Gouty, RN, BSN, Rita [birth year] [phone] y.o. Other Clinician: Date of Birth/Sex: Female) Treating BURNS III, Primary Care Physician/Extender: Georgetta Haber, Anderson [place] Physician: Referring Physician: Burgess Amor in Treatment: 0 Abuse/Suicide Risk Screen Items Answer ABUSE/SUICIDE RISK SCREEN: Has anyone close to you tried to hurt or harm you recentlyo No Do you feel uncomfortable with anyone in your familyo No Has anyone forced you do things that you didnot want to doo No Do you have any thoughts of harming yourselfo No Patient displays signs or symptoms of abuse and/or neglect. No Electronic Signature(s) Signed: 09/21/2015 9:02:33 AM By: Regan Lemming BSN, RN Entered By: Regan Lemming on 09/21/2015 09:02:33 YAVETTE, GUTSCH ([id number]) -------------------------------------------------------------------------------- Activities of Daily Living Details Patient Name: Linda Hart Date of Service: 09/21/2015 8:45 AM Medical Record Patient Account Number: [id number] Number: Treating RN: Afful, RN, BSN, Rita 1925/03/27 [phone] y.o. Other Clinician: Date of Birth/Sex: Female) Treating BURNS III, Primary Care Physician/Extender: Georgetta Haber, Anderson [place] Physician: Referring Physician: Burgess Amor in Treatment: 0 Activities of Daily Living Items Answer Activities of Daily Living (Please select one for each item) Drive Automobile Not Able Take Medications Completely Able Use Telephone Completely Able Care for Appearance Completely Able Use Toilet Completely Able Bath / Shower Completely Able Dress Self Completely Able Feed Self Completely Able Walk Completely Able Get In / Out Bed Completely Able Housework Completely  Able Prepare Meals Completely Able Handle Money Completely Able Shop for Self Completely Able Electronic Signature(s) Signed: 09/21/2015 9:08:38 AM By: Regan Lemming BSN, RN Entered By: Regan Lemming on 09/21/2015 09:08:38 Linda Hart ([id number]) -------------------------------------------------------------------------------- Education Assessment Details Patient Name: Linda Hart Date of Service: 09/21/2015 8:45 AM Medical Record Patient Account Number: [id number] Number: Treating RN: Baruch Gouty, RN, BSN, Rita [birth year] (81 y.o. Other Clinician: Date of Birth/Sex: Female) Treating BURNS III, Primary Care Physician/Extender: Georgetta Haber, Anderson [place] Physician: Referring Physician: Burgess Amor in Treatment: 0 Primary Learner Assessed: Patient Learning Preferences/Education Level/Primary Language Learning Preference: Explanation Preferred Language: English Cognitive Barrier Assessment/Beliefs Language Barrier: No Physical Barrier Assessment Impaired Vision: Yes Glasses Impaired Hearing: No Decreased Hand dexterity: No Knowledge/Comprehension Assessment Knowledge Level: Medium Comprehension Level: Medium Ability to understand written Medium instructions: Ability to understand verbal Medium instructions: Motivation Assessment Anxiety Level: Calm Cooperation: Cooperative Education Importance: Acknowledges Need Interest in Health Problems: Asks Questions Perception: Coherent Willingness to Engage in Self- Medium Management Activities: Readiness to Engage in Self- Medium Management Activities: Electronic Signature(s) Signed: 09/21/2015 9:07:43 AM By: Regan Lemming BSN, RN Entered By: Regan Lemming on 09/21/2015 09:07:43 Linda Hart, Linda Hart ([id number]) Linda Hart, Linda Hart ([id number]) -------------------------------------------------------------------------------- Fall Risk Assessment Details Patient Name: Linda Hart Date of Service: 09/21/2015 8:45  AM Medical Record Patient Account Number: [id number] Number: Treating RN: Baruch Gouty, RN, BSN, Rita [birth year] (81 y.o. Other Clinician: Date of Birth/Sex: Female) Treating BURNS III, Primary Care Physician/Extender: Georgetta Haber, Anderson [place] Physician: Referring Physician: Burgess Amor in Treatment: 0 Fall Risk Assessment Items Have you had 2 or more falls in the last 12 monthso 0 Yes Have you had any fall that resulted in injury in the last 12 monthso 0 No FALL RISK ASSESSMENT: History of falling - immediate or within 3 months 25 Yes Secondary diagnosis 0 No Ambulatory aid None/bed rest/wheelchair/nurse 0 Yes Crutches/cane/walker 0 No Furniture 0 No IV Access/Saline Lock 0 No Gait/Training Normal/bed rest/immobile 0 Yes  Weak 0 No Impaired 0 No Mental Status Oriented to own ability 0 Yes Electronic Signature(s) Signed: 09/21/2015 9:07:03 AM By: Regan Lemming BSN, RN Entered By: Regan Lemming on 09/21/2015 09:07:03 Linda Hart, Linda Hart ([id number]) -------------------------------------------------------------------------------- Foot Assessment Details Patient Name: Linda Hart Date of Service: 09/21/2015 8:45 AM Medical Record Patient Account Number: [id number] Number: Treating RN: Baruch Gouty, RN, BSN, Rita 07-24-1925 (81 y.o. Other Clinician: Date of Birth/Sex: Female) Treating BURNS III, Primary Care Physician/Extender: Georgetta Haber, Anderson [place] Physician: Referring Physician: Burgess Amor in Treatment: 0 Foot Assessment Items Site Locations + = Sensation present, - = Sensation absent, C = Callus, U = Ulcer R = Redness, W = Warmth, M = Maceration, PU = Pre-ulcerative lesion F = Fissure, S = Swelling, D = Dryness Assessment Right: Left: Other Deformity: No No Prior Foot Ulcer: No No Prior Amputation: No No Charcot Joint: No No Ambulatory Status: Ambulatory Without Help Gait: Steady Electronic Signature(s) Signed: 09/21/2015 9:03:30 AM  By: Regan Lemming BSN, RN Entered By: Regan Lemming on 09/21/2015 09:03:30 Linda Hart, Linda Hart ([id number]) Linda Hart, Linda Hart ([id number]) -------------------------------------------------------------------------------- Nutrition Risk Assessment Details Patient Name: Linda Hart Date of Service: 09/21/2015 8:45 AM Medical Record Patient Account Number: [id number] Number: Treating RN: Baruch Gouty, RN, BSN, Rita [birth year] (81 y.o. Other Clinician: Date of Birth/Sex: Female) Treating BURNS III, Primary Care Physician/Extender: Georgetta Haber, Anderson [place] Physician: Referring Physician: Burgess Amor in Treatment: 0 Height (in): Weight (lbs): Body Mass Index (BMI): Nutrition Risk Assessment Items NUTRITION RISK SCREEN: I have an illness or condition that made me change the kind and/or 0 No amount of food I eat I eat fewer than two meals per day 0 No I eat few fruits and vegetables, or milk products 0 No I have three or more drinks of beer, liquor or wine almost every day 0 No I have tooth or mouth problems that make it hard for me to eat 0 No I don't always have enough money to buy the food I need 0 No I eat alone most of the time 0 No I take three or more different prescribed or over-the-counter drugs a 0 No day Without wanting to, I have lost or gained 10 pounds in the last six 2 Yes months I am not always physically able to shop, cook and/or feed myself 0 No Nutrition Protocols Good Risk Protocol 0 No interventions needed Moderate Risk Protocol Electronic Signature(s) Signed: 09/21/2015 9:06:49 AM By: Regan Lemming BSN, RN Entered By: Regan Lemming on 09/21/2015 09:06:49

## 2015-09-22 NOTE — Progress Notes (Addendum)
Linda Hart ([id number]) Visit Report for 09/21/2015 Allergy List Details Patient Name: Linda Hart Date of Service: 09/21/2015 8:45 AM Medical Record Patient Account Number: [id number] Number: Treating RN: Linda Gouty, RN, BSN, Linda Hart 1925-09-13 [phone] y.o. Other Clinician: Date of Birth/Sex: Female) Treating BURNS III, Primary Care Physician: Linda Hart Physician/Extender: Linda Hart Referring Physician: Burgess Hart in Treatment: 0 Allergies Active Allergies PCN Allergy Notes Electronic Signature(s) Signed: 09/21/2015 9:09:04 AM By: Linda Hart BSN, RN Entered By: Linda Hart on 09/21/2015 09:09:04 Linda Hart ([id number]) -------------------------------------------------------------------------------- Arrival Information Details Patient Name: Linda Hart Date of Service: 09/21/2015 8:45 AM Medical Record Patient Account Number: [id number] Number: Treating RN: Linda Gouty, RN, BSN, Linda Hart Feb 09, 1925 (81 y.o. Other Clinician: Date of Birth/Sex: Female) Treating BURNS III, Primary Care Physician: Linda Hart Physician/Extender: Linda Hart Referring Physician: Burgess Hart in Treatment: 0 Visit Information Patient Arrived: Ambulatory Arrival Time: 09:01 Accompanied By: dtr Transfer Assistance: None Patient Identification Verified: Yes Secondary Verification Process Yes Completed: Patient Requires Transmission- No Based Precautions: Patient Has Alerts: Yes Patient Alerts: Patient on Blood Thinner Aspirin Electronic Signature(s) Signed: 09/21/2015 9:02:06 AM By: Linda Hart BSN, RN Entered By: Linda Hart on 09/21/2015 09:02:06 Linda Hart ([id number]) -------------------------------------------------------------------------------- Clinic Level of Care Assessment Details Patient Name: Linda Hart Date of Service: 09/21/2015 8:45 AM Medical Record Patient Account Number: [id number] Number: Treating RN:  Linda Gouty, RN, BSN, Linda Hart 1925/03/23 (81 y.o. Other Clinician: Date of Birth/Sex: Female) Treating BURNS III, Primary Care Physician: Linda Hart Physician/Extender: Linda Hart Referring Physician: Burgess Hart in Treatment: 0 Clinic Level of Care Assessment Items TOOL 1 Quantity Score []  - Use when EandM and Procedure is performed on INITIAL visit 0 ASSESSMENTS - Nursing Assessment / Reassessment X - General Physical Exam (combine w/ comprehensive assessment (listed just 1 20 below) when performed on new pt. evals) X - Comprehensive Assessment (HX, ROS, Risk Assessments, Wounds Hx, etc.) 1 25 ASSESSMENTS - Wound and Skin Assessment / Reassessment []  - Dermatologic / Skin Assessment (not related to wound area) 0 ASSESSMENTS - Ostomy and/or Continence Assessment and Care []  - Incontinence Assessment and Management 0 []  - Ostomy Care Assessment and Management (repouching, etc.) 0 PROCESS - Coordination of Care X - Simple Patient / Family Education for ongoing care 1 15 []  - Complex (extensive) Patient / Family Education for ongoing care 0 X - Staff obtains Consents, Records, Test Results / Process Orders 1 10 []  - Staff telephones HHA, Nursing Homes / Clarify orders / etc 0 []  - Routine Transfer to another Facility (non-emergent condition) 0 []  - Routine Hospital Admission (non-emergent condition) 0 X - New Admissions / Biomedical engineer / Ordering NPWT, Apligraf, etc. 1 15 []  - Emergency Hospital Admission (emergent condition) 0 PROCESS - Special Needs []  - Pediatric / Minor Patient Management 0 [name], [name] ([id number]) []  - Isolation Patient Management 0 []  - Hearing / Language / Visual special needs 0 []  - Assessment of Community assistance (transportation, D/C planning, etc.) 0 []  - Additional assistance / Altered mentation 0 []  - Support Surface(s) Assessment (bed, cushion, seat, etc.) 0 INTERVENTIONS - Miscellaneous []  - External ear exam 0 []  - Patient  Transfer (multiple staff / Civil Service fast streamer / Similar devices) 0 []  - Simple Staple / Suture removal (25 or less) 0 []  - Complex Staple / Suture removal (26 or more) 0 []  - Hypo/Hyperglycemic Management (do not check if billed separately) 0 X - Ankle / Brachial Index (ABI) - do not check if billed separately 1 15 Has the patient  been seen at the hospital within the last three years: Yes Total Score: 100 Level Of Care: New/Established - Level 3 Electronic Signature(s) Signed: 09/21/2015 9:51:22 AM By: Linda Hart BSN, RN Entered By: Linda Hart on 09/21/2015 09:51:21 Linda Hart ([id number]) -------------------------------------------------------------------------------- Encounter Discharge Information Details Patient Name: Linda Hart Date of Service: 09/21/2015 8:45 AM Medical Record Patient Account Number: [id number] Number: Treating RN: Linda Gouty, RN, BSN, Linda Hart 05/01/1925 (81 y.o. Other Clinician: Date of Birth/Sex: Female) Treating BURNS III, Primary Care Physician: Linda Hart Physician/Extender: Linda Hart Referring Physician: Burgess Hart in Treatment: 0 Encounter Discharge Information Items Discharge Pain Level: 0 Discharge Condition: Stable Ambulatory Status: Ambulatory Discharge Destination: Home Transportation: Private Auto Accompanied By: dtr Schedule Follow-up Appointment: No Medication Reconciliation completed and provided to Patient/Care No [name]: Provided on Clinical Summary of Care: 09/21/2015 Form Type Recipient Paper Patient FJ Electronic Signature(s) Signed: 09/21/2015 9:56:53 AM By: Ruthine Dose Previous Signature: 09/21/2015 9:52:54 AM Version By: Linda Hart BSN, RN Entered By: Ruthine Dose on 09/21/2015 09:56:53 [name], [name] ([id number]) -------------------------------------------------------------------------------- Lower Extremity Assessment Details Patient Name: Linda Hart Date of Service: 09/21/2015 8:45  AM Medical Record Patient Account Number: [id number] Number: Treating RN: Linda Gouty, RN, BSN, Linda Hart 1925-04-14 (81 y.o. Other Clinician: Date of Birth/Sex: Female) Treating BURNS III, Primary Care Physician: Linda Hart Physician/Extender: Linda Hart Referring Physician: Burgess Hart in Treatment: 0 Vascular Assessment Claudication: Claudication Assessment [Left:None] [Right:None] Pulses: Posterior Tibial Palpable: [Left:Yes] [Right:Yes] Doppler: [Left:Monophasic] [Right:Monophasic] Dorsalis Pedis Palpable: [Left:Yes] [Right:Yes] Doppler: [Left:Monophasic] [Right:Monophasic] Extremity colors, hair growth, and conditions: Extremity Color: [Left:Normal] [Right:Normal] Hair Growth on Extremity: [Left:No] [Right:No] Temperature of Extremity: [Left:Warm] [Right:Warm] Capillary Refill: [Left:< 3 seconds] [Right:< 3 seconds] Blood Pressure: Brachial: [Left:160] [Right:138] Dorsalis Pedis: 140 [Left:Dorsalis Pedis: 160] Ankle: Posterior Tibial: 142 [Left:Posterior Tibial: 140 0.89] [Right:1.00] Toe Nail Assessment Left: Right: Thick: No No Discolored: No No Deformed: No No Improper Length and Hygiene: No No Electronic Signature(s) Signed: 09/21/2015 9:28:19 AM By: Linda Hart BSN, RN Previous Signature: 09/21/2015 9:26:51 AM Version By: Linda Hart BSN, RN Entered By: Linda Hart on 09/21/2015 09:28:18 Linda Hart ([id number]) -------------------------------------------------------------------------------- Multi Wound Chart Details Patient Name: Linda Hart Date of Service: 09/21/2015 8:45 AM Medical Record Patient Account Number: [id number] Number: Treating RN: Linda Gouty, RN, BSN, Linda Hart Nov 18, 1924 (81 y.o. Other Clinician: Date of Birth/Sex: Female) Treating BURNS III, Primary Care Physician: Linda Hart Physician/Extender: Linda Hart Referring Physician: Burgess Hart in Treatment: 0 Vital Signs Height(in): 61 Pulse(bpm):  86 Weight(lbs): 106 Blood Pressure 146/56 (mmHg): Body Mass Index(BMI): 20 Temperature(F): Respiratory Rate 18 (breaths/min): Photos: [1:No Photos] [N/A:N/A] Wound Location: [1:Left Lower Leg - Anterior] [N/A:N/A] Wounding Event: [1:Trauma] [N/A:N/A] Primary Etiology: [1:Trauma, Other] [N/A:N/A] Comorbid History: [1:Cataracts, Arrhythmia, Hypertension, Osteoarthritis] [N/A:N/A] Date Acquired: [1:08/30/2015] [N/A:N/A] Weeks of Treatment: [1:0] [N/A:N/A] Wound Status: [1:Open] [N/A:N/A] Measurements L x W x D 2x3x0.1 [N/A:N/A] (cm) Area (cm) : [1:4.712] [N/A:N/A] Volume (cm) : [1:0.471] [N/A:N/A] % Reduction in Area: [1:0.00%] [N/A:N/A] % Reduction in Volume: 0.00% [N/A:N/A] Classification: [1:Unclassifiable] [N/A:N/A] Exudate Amount: [1:Small] [N/A:N/A] Exudate Type: [1:Serosanguineous] [N/A:N/A] Exudate Color: [1:red, brown] [N/A:N/A] Wound Margin: [1:Indistinct, nonvisible] [N/A:N/A] Granulation Amount: [1:None Present (0%)] [N/A:N/A] Necrotic Amount: [1:Large (67-100%)] [N/A:N/A] Necrotic Tissue: [1:Eschar] [N/A:N/A] Exposed Structures: [1:Fascia: No Fat: No Tendon: No Muscle: No] [N/A:N/A] Joint: No Bone: No Limited to Skin Breakdown Epithelialization: None N/A N/A Periwound Skin Texture: Edema: Yes N/A N/A Excoriation: No Induration: No Callus: No Crepitus: No Fluctuance: No Friable: No Rash: No Scarring: No Periwound Skin Moist: Yes N/A N/A Moisture: Maceration: No  Dry/Scaly: No Periwound Skin Color: Atrophie Blanche: No N/A N/A Cyanosis: No Ecchymosis: No Erythema: No Hemosiderin Staining: No Mottled: No Pallor: No Rubor: No Temperature: No Abnormality N/A N/A Tenderness on No N/A N/A Palpation: Wound Preparation: Ulcer Cleansing: N/A N/A Rinsed/Irrigated with Saline Topical Anesthetic Applied: Other: lidocaine 4% Treatment Notes Electronic Signature(s) Signed: 09/21/2015 9:40:41 AM By: Linda Hart BSN, RN Entered By: Linda Hart on  09/21/2015 09:40:41 KEILANY, CALIGIURI ([id number]) -------------------------------------------------------------------------------- [place] Details Patient Name: Linda Hart Date of Service: 09/21/2015 8:45 AM Medical Record Patient Account Number: [id number] Number: Treating RN: Linda Gouty, RN, BSN, Linda Hart [birth year] [phone] y.o. Other Clinician: Date of Birth/Sex: Female) Treating BURNS III, Primary Care Physician: Linda Hart Physician/Extender: Linda Hart Referring Physician: Burgess Hart in Treatment: 0 Active Inactive Abuse / Safety / Falls / Self Care Management Nursing Diagnoses: Impaired home maintenance Impaired physical mobility Knowledge deficit related to: safety; personal, health (wound), emergency Potential for falls Self care deficit: actual or potential Goals: Patient/caregiver will verbalize understanding of skin care regimen Date Initiated: 09/21/2015 Goal Status: Active Patient/caregiver will verbalize understanding of the importance to maintain current immunizations/vaccinations Date Initiated: 09/21/2015 Goal Status: Active Patient/caregiver will verbalize/demonstrate measure taken to improve self care Date Initiated: 09/21/2015 Goal Status: Active Patient/caregiver will verbalize/demonstrate measures taken to improve the patient's personal safety Date Initiated: 09/21/2015 Goal Status: Active Patient/caregiver will verbalize/demonstrate measures taken to prevent injury and/or falls Date Initiated: 09/21/2015 Goal Status: Active Patient/caregiver will verbalize/demonstrate understanding of what to do in case of emergency Date Initiated: 09/21/2015 Goal Status: Active Interventions: Assess fall risk on admission and as needed Assess: immobility, friction, shearing, incontinence upon admission and as needed CHAUNTA, HERREL ([id number]) Assess impairment of mobility on admission and as needed per policy Assess self  care needs on admission and as needed Provide education on basic hygiene Provide education on fall prevention Provide education on safe transfers Notes: Orientation to the Wound Care Program Nursing Diagnoses: Knowledge deficit related to the wound healing center program Goals: Patient/caregiver will verbalize understanding of the [place] Program Date Initiated: 09/21/2015 Goal Status: Active Interventions: Provide education on orientation to the wound center Notes: Wound/Skin Impairment Nursing Diagnoses: Impaired tissue integrity Knowledge deficit related to smoking impact on wound healing Knowledge deficit related to ulceration/compromised skin integrity Goals: Patient/caregiver will verbalize understanding of skin care regimen Date Initiated: 09/21/2015 Goal Status: Active Ulcer/skin breakdown will have a volume reduction of 30% by week 4 Date Initiated: 09/21/2015 Goal Status: Active Ulcer/skin breakdown will have a volume reduction of 50% by week 8 Date Initiated: 09/21/2015 Goal Status: Active Ulcer/skin breakdown will have a volume reduction of 80% by week 12 Date Initiated: 09/21/2015 Goal Status: Active Ulcer/skin breakdown will heal within 14 weeks Date Initiated: 09/21/2015 Goal Status: Active PEARLE, GABEL ([id number]) Interventions: Assess patient/caregiver ability to obtain necessary supplies Assess patient/caregiver ability to perform ulcer/skin care regimen upon admission and as needed Assess ulceration(s) every visit Provide education on ulcer and skin care Treatment Activities: Skin care regimen initiated : 09/21/2015 Topical wound management initiated : 09/21/2015 Notes: Electronic Signature(s) Signed: 09/21/2015 9:40:28 AM By: Linda Hart BSN, RN Entered By: Linda Hart on 09/21/2015 09:40:28 Linda Hart ([id number]) -------------------------------------------------------------------------------- Pain Assessment Details Patient  Name: Linda Hart Date of Service: 09/21/2015 8:45 AM Medical Record Patient Account Number: [id number] Number: Treating RN: Linda Gouty, RN, BSN, Linda Hart [birth year] (81 y.o. Other Clinician: Date of Birth/Sex: Female) Treating BURNS III, Primary Care Physician: Linda Hart Physician/Extender: Linda Hart Referring Physician: Burgess Hart in Treatment:  0 Active Problems Location of Pain Severity and Description of Pain Patient Has Paino No Site Locations Pain Management and Medication Current Pain Management: Electronic Signature(s) Signed: 09/21/2015 9:09:18 AM By: Linda Hart BSN, RN Entered By: Linda Hart on 09/21/2015 09:09:17 Linda Hart ([id number]) -------------------------------------------------------------------------------- Patient/Caregiver Education Details Patient Name: Linda Hart Date of Service: 09/21/2015 8:45 AM Medical Record Patient Account Number: [id number] Number: Treating RN: Linda Gouty, RN, BSN, Linda Hart 1925-09-25 (81 y.o. Other Clinician: Date of Birth/Gender: Female) Treating BURNS III, Primary Care Physician: Linda Hart Physician/Extender: Linda Hart Referring Physician: Burgess Hart in Treatment: 0 Education Assessment Education Provided To: Patient Education Topics Provided Basic Hygiene: Methods: Explain/Verbal Responses: State content correctly Safety: Methods: Explain/Verbal Responses: State content correctly Welcome To [facility]: Methods: Explain/Verbal Responses: State content correctly Wound/Skin Impairment: Methods: Explain/Verbal Responses: State content correctly Electronic Signature(s) Signed: 09/21/2015 9:53:13 AM By: Linda Hart BSN, RN Entered By: Linda Hart on 09/21/2015 09:53:13 Linda Hart ([id number]) -------------------------------------------------------------------------------- Wound Assessment Details Patient Name: Linda Hart Date of Service:  09/21/2015 8:45 AM Medical Record Patient Account Number: [id number] Number: Treating RN: Linda Gouty, RN, BSN, Linda Hart 1925-03-20 (81 y.o. Other Clinician: Date of Birth/Sex: Female) Treating BURNS III, Primary Care Physician: Linda Hart Physician/Extender: Linda Hart Referring Physician: Burgess Hart in Treatment: 0 Wound Status Wound Number: 1 Primary Trauma, Other Etiology: Wound Location: Left Lower Leg - Anterior Wound Status: Open Wounding Event: Trauma Comorbid Cataracts, Arrhythmia, Hypertension, Date Acquired: 08/30/2015 History: Osteoarthritis Weeks Of Treatment: 0 Clustered Wound: No Photos Photo Uploaded By: Linda Hart on 09/21/2015 16:55:45 Wound Measurements Length: (cm) 2 Width: (cm) 3 Depth: (cm) 0.1 Area: (cm) 4.712 Volume: (cm) 0.471 % Reduction in Area: 0% % Reduction in Volume: 0% Epithelialization: None Tunneling: No Undermining: No Wound Description Classification: Unclassifiable Wound Margin: Indistinct, nonvisible Exudate Amount: Small Exudate Type: Serosanguineous Exudate Color: red, brown Wound Bed Granulation Amount: None Present (0%) Exposed Structure Necrotic Amount: Large (67-100%) Fascia Exposed: No Necrotic Quality: Eschar Fat Layer Exposed: No Crampton, Noella ([id number]) Tendon Exposed: No Muscle Exposed: No Joint Exposed: No Bone Exposed: No Limited to Skin Breakdown Periwound Skin Texture Texture Color No Abnormalities Noted: No No Abnormalities Noted: No Callus: No Atrophie Blanche: No Crepitus: No Cyanosis: No Excoriation: No Ecchymosis: No Fluctuance: No Erythema: No Friable: No Hemosiderin Staining: No Induration: No Mottled: No Localized Edema: Yes Pallor: No Rash: No Rubor: No Scarring: No Temperature / Pain Moisture Temperature: No Abnormality No Abnormalities Noted: No Dry / Scaly: No Maceration: No Moist: Yes Wound Preparation Ulcer Cleansing: Rinsed/Irrigated with  Saline Topical Anesthetic Applied: Other: lidocaine 4%, Treatment Notes Wound #1 (Left, Anterior Lower Leg) 1. Cleansed with: Clean wound with Normal Saline 4. Dressing Applied: Aquacel Ag 5. Secondary Dressing Applied Bordered Foam Dressing 7. Secured with Financial risk analyst) Signed: 09/21/2015 9:26:05 AM By: Linda Hart BSN, RN Entered By: Linda Hart on 09/21/2015 09:26:05 MEHEK, ZODY ([id number]) -------------------------------------------------------------------------------- [place] Details Patient Name: Linda Hart Date of Service: 09/21/2015 8:45 AM Medical Record Patient Account Number: [id number] Number: Treating RN: Linda Gouty, RN, BSN, Linda Hart 17-Dec-1924 (81 y.o. Other Clinician: Date of Birth/Sex: Female) Treating BURNS III, Primary Care Physician: Linda Hart Physician/Extender: Linda Hart Referring Physician: Burgess Hart in Treatment: 0 Vital Signs Time Taken: 09:09 Pulse (bpm): 86 Height (in): 61 Respiratory Rate (breaths/min): 18 Source: Stated Blood Pressure (mmHg): 146/56 Weight (lbs): 106 Reference Range: 80 - 120 mg / dl Source: Stated Body Mass Index (BMI): 20 Electronic Signature(s) Signed: 09/21/2015 9:10:14 AM By: Linda Hart BSN,  RN Entered By: Linda Hart on 09/21/2015 09:10:13

## 2015-09-23 ENCOUNTER — Encounter: Payer: Self-pay | Admitting: *Deleted

## 2015-09-23 ENCOUNTER — Emergency Department: Payer: Medicare Other

## 2015-09-23 ENCOUNTER — Inpatient Hospital Stay
Admission: EM | Admit: 2015-09-23 | Discharge: 2015-09-26 | DRG: 291 | Disposition: A | Discharge status: Skilled Nursing Facility | Payer: Medicare Other | Attending: Internal Medicine | Admitting: Internal Medicine

## 2015-09-23 ENCOUNTER — Inpatient Hospital Stay (HOSPITAL_COMMUNITY)
Admit: 2015-09-23 | Discharge: 2015-09-23 | Disposition: A | Discharge status: Home / Self Care | Payer: Medicare Other | Attending: Internal Medicine | Admitting: Internal Medicine

## 2015-09-23 DIAGNOSIS — I110 Hypertensive heart disease with heart failure: Secondary | ICD-10-CM | POA: Diagnosis not present

## 2015-09-23 DIAGNOSIS — I493 Ventricular premature depolarization: Secondary | ICD-10-CM | POA: Diagnosis present

## 2015-09-23 DIAGNOSIS — D638 Anemia in other chronic diseases classified elsewhere: Secondary | ICD-10-CM | POA: Diagnosis present

## 2015-09-23 DIAGNOSIS — J9601 Acute respiratory failure with hypoxia: Secondary | ICD-10-CM | POA: Diagnosis present

## 2015-09-23 DIAGNOSIS — I130 Hypertensive heart and chronic kidney disease with heart failure and stage 1 through stage 4 chronic kidney disease, or unspecified chronic kidney disease: Secondary | ICD-10-CM | POA: Diagnosis present

## 2015-09-23 DIAGNOSIS — Z8249 Family history of ischemic heart disease and other diseases of the circulatory system: Secondary | ICD-10-CM | POA: Diagnosis not present

## 2015-09-23 DIAGNOSIS — I255 Ischemic cardiomyopathy: Secondary | ICD-10-CM | POA: Diagnosis not present

## 2015-09-23 DIAGNOSIS — I252 Old myocardial infarction: Secondary | ICD-10-CM

## 2015-09-23 DIAGNOSIS — I340 Nonrheumatic mitral (valve) insufficiency: Secondary | ICD-10-CM | POA: Diagnosis not present

## 2015-09-23 DIAGNOSIS — I959 Hypotension, unspecified: Secondary | ICD-10-CM | POA: Diagnosis present

## 2015-09-23 DIAGNOSIS — J40 Bronchitis, not specified as acute or chronic: Secondary | ICD-10-CM | POA: Diagnosis not present

## 2015-09-23 DIAGNOSIS — Z9889 Other specified postprocedural states: Secondary | ICD-10-CM | POA: Diagnosis not present

## 2015-09-23 DIAGNOSIS — Z66 Do not resuscitate: Secondary | ICD-10-CM | POA: Diagnosis present

## 2015-09-23 DIAGNOSIS — Z880 Allergy status to penicillin: Secondary | ICD-10-CM

## 2015-09-23 DIAGNOSIS — Z79899 Other long term (current) drug therapy: Secondary | ICD-10-CM | POA: Diagnosis not present

## 2015-09-23 DIAGNOSIS — I2510 Atherosclerotic heart disease of native coronary artery without angina pectoris: Secondary | ICD-10-CM | POA: Diagnosis present

## 2015-09-23 DIAGNOSIS — R748 Abnormal levels of other serum enzymes: Secondary | ICD-10-CM | POA: Diagnosis present

## 2015-09-23 DIAGNOSIS — Z955 Presence of coronary angioplasty implant and graft: Secondary | ICD-10-CM | POA: Diagnosis not present

## 2015-09-23 DIAGNOSIS — R0602 Shortness of breath: Secondary | ICD-10-CM | POA: Diagnosis present

## 2015-09-23 DIAGNOSIS — Z7982 Long term (current) use of aspirin: Secondary | ICD-10-CM | POA: Diagnosis not present

## 2015-09-23 DIAGNOSIS — I471 Supraventricular tachycardia: Secondary | ICD-10-CM | POA: Diagnosis present

## 2015-09-23 DIAGNOSIS — D649 Anemia, unspecified: Secondary | ICD-10-CM | POA: Diagnosis not present

## 2015-09-23 DIAGNOSIS — J209 Acute bronchitis, unspecified: Secondary | ICD-10-CM | POA: Diagnosis not present

## 2015-09-23 DIAGNOSIS — I5023 Acute on chronic systolic (congestive) heart failure: Secondary | ICD-10-CM | POA: Diagnosis not present

## 2015-09-23 DIAGNOSIS — I480 Paroxysmal atrial fibrillation: Secondary | ICD-10-CM | POA: Diagnosis not present

## 2015-09-23 DIAGNOSIS — J069 Acute upper respiratory infection, unspecified: Secondary | ICD-10-CM | POA: Diagnosis present

## 2015-09-23 DIAGNOSIS — N189 Chronic kidney disease, unspecified: Secondary | ICD-10-CM | POA: Diagnosis present

## 2015-09-23 DIAGNOSIS — I5043 Acute on chronic combined systolic (congestive) and diastolic (congestive) heart failure: Principal | ICD-10-CM | POA: Diagnosis present

## 2015-09-23 DIAGNOSIS — I509 Heart failure, unspecified: Secondary | ICD-10-CM

## 2015-09-23 DIAGNOSIS — E784 Other hyperlipidemia: Secondary | ICD-10-CM | POA: Diagnosis not present

## 2015-09-23 DIAGNOSIS — R05 Cough: Secondary | ICD-10-CM | POA: Diagnosis not present

## 2015-09-23 DIAGNOSIS — A419 Sepsis, unspecified organism: Secondary | ICD-10-CM

## 2015-09-23 DIAGNOSIS — Z7902 Long term (current) use of antithrombotics/antiplatelets: Secondary | ICD-10-CM | POA: Diagnosis not present

## 2015-09-23 DIAGNOSIS — J189 Pneumonia, unspecified organism: Secondary | ICD-10-CM

## 2015-09-23 DIAGNOSIS — R6520 Severe sepsis without septic shock: Secondary | ICD-10-CM

## 2015-09-23 DIAGNOSIS — K219 Gastro-esophageal reflux disease without esophagitis: Secondary | ICD-10-CM | POA: Diagnosis present

## 2015-09-23 DIAGNOSIS — M6281 Muscle weakness (generalized): Secondary | ICD-10-CM | POA: Diagnosis not present

## 2015-09-23 DIAGNOSIS — R2689 Other abnormalities of gait and mobility: Secondary | ICD-10-CM | POA: Diagnosis not present

## 2015-09-23 DIAGNOSIS — E785 Hyperlipidemia, unspecified: Secondary | ICD-10-CM | POA: Diagnosis present

## 2015-09-23 DIAGNOSIS — S81812D Laceration without foreign body, left lower leg, subsequent encounter: Secondary | ICD-10-CM | POA: Diagnosis not present

## 2015-09-23 DIAGNOSIS — B9789 Other viral agents as the cause of diseases classified elsewhere: Secondary | ICD-10-CM

## 2015-09-23 DIAGNOSIS — J9600 Acute respiratory failure, unspecified whether with hypoxia or hypercapnia: Secondary | ICD-10-CM

## 2015-09-23 DIAGNOSIS — I5042 Chronic combined systolic (congestive) and diastolic (congestive) heart failure: Secondary | ICD-10-CM

## 2015-09-23 LAB — CBC WITH DIFFERENTIAL/PLATELET
BASOS PCT: 1 %
Basophils Absolute: 0.1 10*3/uL (ref 0–0.1)
EOS PCT: 3 %
Eosinophils Absolute: 0.5 10*3/uL (ref 0–0.7)
HEMATOCRIT: 34.3 % — AB (ref 35.0–47.0)
Hemoglobin: 10.9 g/dL — ABNORMAL LOW (ref 12.0–16.0)
LYMPHS PCT: 18 %
Lymphs Abs: 3.5 10*3/uL (ref 1.0–3.6)
MCH: 27.5 pg (ref 26.0–34.0)
MCHC: 31.7 g/dL — AB (ref 32.0–36.0)
MCV: 86.7 fL (ref 80.0–100.0)
MONO ABS: 1.4 10*3/uL — AB (ref 0.2–0.9)
MONOS PCT: 7 %
NEUTROS ABS: 14 10*3/uL — AB (ref 1.4–6.5)
Neutrophils Relative %: 71 %
PLATELETS: 360 10*3/uL (ref 150–440)
RBC: 3.95 MIL/uL (ref 3.80–5.20)
RDW: 14.6 % — AB (ref 11.5–14.5)
WBC: 19.5 10*3/uL — ABNORMAL HIGH (ref 3.6–11.0)

## 2015-09-23 LAB — URINALYSIS COMPLETE WITH MICROSCOPIC (ARMC ONLY)
BILIRUBIN URINE: NEGATIVE
Bacteria, UA: NONE SEEN
GLUCOSE, UA: NEGATIVE mg/dL
Ketones, ur: NEGATIVE mg/dL
Leukocytes, UA: NEGATIVE
NITRITE: NEGATIVE
PH: 5 (ref 5.0–8.0)
Protein, ur: 30 mg/dL — AB
Specific Gravity, Urine: 1.011 (ref 1.005–1.030)

## 2015-09-23 LAB — HEMOGLOBIN A1C: HEMOGLOBIN A1C: 5.9 % (ref 4.0–6.0)

## 2015-09-23 LAB — COMPREHENSIVE METABOLIC PANEL
ALBUMIN: 3.2 g/dL — AB (ref 3.5–5.0)
ALK PHOS: 107 U/L (ref 38–126)
ALT: 16 U/L (ref 14–54)
ANION GAP: 8 (ref 5–15)
AST: 34 U/L (ref 15–41)
BILIRUBIN TOTAL: 1 mg/dL (ref 0.3–1.2)
BUN: 22 mg/dL — AB (ref 6–20)
CALCIUM: 8.6 mg/dL — AB (ref 8.9–10.3)
CO2: 21 mmol/L — AB (ref 22–32)
Chloride: 106 mmol/L (ref 101–111)
Creatinine, Ser: 1.47 mg/dL — ABNORMAL HIGH (ref 0.44–1.00)
GFR calc Af Amer: 36 mL/min — ABNORMAL LOW (ref >60)
GFR calc non Af Amer: 31 mL/min — ABNORMAL LOW (ref >60)
GLUCOSE: 192 mg/dL — AB (ref 65–99)
Potassium: 4.9 mmol/L (ref 3.5–5.1)
SODIUM: 135 mmol/L (ref 135–145)
TOTAL PROTEIN: 6.7 g/dL (ref 6.5–8.1)

## 2015-09-23 LAB — LACTIC ACID, PLASMA
LACTIC ACID, VENOUS: 3.4 mmol/L — AB (ref 0.5–2.0)
LACTIC ACID, VENOUS: 1.5 mmol/L (ref 0.5–2.0)
LACTIC ACID, VENOUS: 1 mmol/L (ref 0.5–2.0)
Lactic Acid, Venous: 1.4 mmol/L (ref 0.5–2.0)

## 2015-09-23 LAB — TROPONIN I
TROPONIN I: 0.04 ng/mL — AB (ref <0.031)
TROPONIN I: 0.26 ng/mL — AB (ref <0.031)
Troponin I: 0.26 ng/mL — ABNORMAL HIGH (ref <0.031)

## 2015-09-23 LAB — BRAIN NATRIURETIC PEPTIDE: B NATRIURETIC PEPTIDE 5: 1298 pg/mL — AB (ref 0.0–100.0)

## 2015-09-23 LAB — INFLUENZA PANEL BY PCR (TYPE A & B)
H1N1FLUPCR: NOT DETECTED
INFLBPCR: NEGATIVE
Influenza A By PCR: NEGATIVE

## 2015-09-23 LAB — MRSA PCR SCREENING: MRSA BY PCR: NEGATIVE

## 2015-09-23 MED ORDER — LEVOFLOXACIN IN D5W 500 MG/100ML IV SOLN: 500.0000 mg | INTRAVENOUS | Status: DC

## 2015-09-23 MED ORDER — ENOXAPARIN SODIUM 30 MG/0.3ML ~~LOC~~ SOLN: 30.0000 mg | SUBCUTANEOUS | Status: DC

## 2015-09-23 MED ORDER — VANCOMYCIN HCL IN DEXTROSE 1-5 GM/200ML-% IV SOLN
1000.0000 mg | Freq: Once | INTRAVENOUS | Status: AC
  Administered 2015-09-23: 1000 mg via INTRAVENOUS
  Filled 2015-09-23: qty 200

## 2015-09-23 MED ORDER — FUROSEMIDE 10 MG/ML IJ SOLN
40.0000 mg | Freq: Once | INTRAMUSCULAR | Status: AC
  Administered 2015-09-23: 40 mg via INTRAVENOUS
  Filled 2015-09-23: qty 4

## 2015-09-23 MED ORDER — ALUM & MAG HYDROXIDE-SIMETH 200-200-20 MG/5ML PO SUSP: 30.0000 mL | Freq: Four times a day (QID) | ORAL | Status: DC | PRN

## 2015-09-23 MED ORDER — TICAGRELOR 60 MG PO TABS
60.0000 mg | ORAL_TABLET | Freq: Two times a day (BID) | ORAL | Status: DC
  Administered 2015-09-23 – 2015-09-26 (×6): 60 mg via ORAL
  Filled 2015-09-23 (×16): qty 1

## 2015-09-23 MED ORDER — CARVEDILOL 3.125 MG PO TABS
3.1250 mg | ORAL_TABLET | Freq: Two times a day (BID) | ORAL | Status: DC
  Administered 2015-09-24 – 2015-09-26 (×5): 3.125 mg via ORAL
  Filled 2015-09-23 (×5): qty 1

## 2015-09-23 MED ORDER — BISACODYL 5 MG PO TBEC: 5.0000 mg | DELAYED_RELEASE_TABLET | Freq: Every day | ORAL | Status: DC | PRN

## 2015-09-23 MED ORDER — HYDROCODONE-ACETAMINOPHEN 5-325 MG PO TABS: 1.0000 | ORAL_TABLET | ORAL | Status: DC | PRN

## 2015-09-23 MED ORDER — ATORVASTATIN CALCIUM 20 MG PO TABS
20.0000 mg | ORAL_TABLET | Freq: Every day | ORAL | Status: DC
  Administered 2015-09-23 – 2015-09-25 (×3): 20 mg via ORAL
  Filled 2015-09-23 (×3): qty 1

## 2015-09-23 MED ORDER — ACETAMINOPHEN 325 MG PO TABS: 650.0000 mg | ORAL_TABLET | Freq: Four times a day (QID) | ORAL | Status: DC | PRN

## 2015-09-23 MED ORDER — LEVOFLOXACIN IN D5W 750 MG/150ML IV SOLN
750.0000 mg | Freq: Once | INTRAVENOUS | Status: AC
  Administered 2015-09-23: 750 mg via INTRAVENOUS
  Filled 2015-09-23: qty 150

## 2015-09-23 MED ORDER — ENOXAPARIN SODIUM 60 MG/0.6ML ~~LOC~~ SOLN: 1.0000 mg/kg | SUBCUTANEOUS | Status: DC

## 2015-09-23 MED ORDER — LISINOPRIL 5 MG PO TABS
5.0000 mg | ORAL_TABLET | Freq: Every day | ORAL | Status: DC
  Administered 2015-09-24 – 2015-09-26 (×3): 5 mg via ORAL
  Filled 2015-09-23 (×3): qty 1

## 2015-09-23 MED ORDER — ASPIRIN 81 MG PO CHEW
324.0000 mg | CHEWABLE_TABLET | Freq: Once | ORAL | Status: AC
  Administered 2015-09-23: 324 mg via ORAL

## 2015-09-23 MED ORDER — NITROGLYCERIN 0.4 MG SL SUBL
0.4000 mg | SUBLINGUAL_TABLET | SUBLINGUAL | Status: DC | PRN
Start: 2015-09-23 — End: 2015-09-26

## 2015-09-23 MED ORDER — SODIUM CHLORIDE 0.9 % IJ SOLN
3.0000 mL | Freq: Two times a day (BID) | INTRAMUSCULAR | Status: DC
  Administered 2015-09-23 – 2015-09-26 (×4): 3 mL via INTRAVENOUS

## 2015-09-23 MED ORDER — CEFTAZIDIME 1 G IJ SOLR
1.0000 g | Freq: Once | INTRAMUSCULAR | Status: DC
  Filled 2015-09-23: qty 1

## 2015-09-23 MED ORDER — LEVALBUTEROL HCL 0.63 MG/3ML IN NEBU: 0.6300 mg | INHALATION_SOLUTION | RESPIRATORY_TRACT | Status: DC | PRN

## 2015-09-23 MED ORDER — ASPIRIN 81 MG PO CHEW
324.0000 mg | CHEWABLE_TABLET | Freq: Once | ORAL | Status: DC
  Filled 2015-09-23: qty 4

## 2015-09-23 MED ORDER — ONDANSETRON HCL 4 MG PO TABS: 4.0000 mg | ORAL_TABLET | Freq: Four times a day (QID) | ORAL | Status: DC | PRN

## 2015-09-23 MED ORDER — ASPIRIN 81 MG PO TABS
81.0000 mg | ORAL_TABLET | Freq: Every day | ORAL | Status: DC
  Administered 2015-09-24 – 2015-09-26 (×3): 81 mg via ORAL
  Filled 2015-09-23 (×7): qty 1

## 2015-09-23 MED ORDER — ACETAMINOPHEN 650 MG RE SUPP: 650.0000 mg | Freq: Four times a day (QID) | RECTAL | Status: DC | PRN

## 2015-09-23 MED ORDER — LORATADINE 10 MG PO TABS
10.0000 mg | ORAL_TABLET | Freq: Every day | ORAL | Status: DC
  Administered 2015-09-24 – 2015-09-26 (×3): 10 mg via ORAL
  Filled 2015-09-23 (×3): qty 1

## 2015-09-23 MED ORDER — SENNOSIDES-DOCUSATE SODIUM 8.6-50 MG PO TABS: 1.0000 | ORAL_TABLET | Freq: Every evening | ORAL | Status: DC | PRN

## 2015-09-23 MED ORDER — DEXTROSE 5 % IV SOLN
1.0000 g | Freq: Once | INTRAVENOUS | Status: AC
  Administered 2015-09-23: 1 g via INTRAVENOUS
  Filled 2015-09-23: qty 1

## 2015-09-23 MED ORDER — ONDANSETRON HCL 4 MG/2ML IJ SOLN: 4.0000 mg | Freq: Four times a day (QID) | INTRAMUSCULAR | Status: DC | PRN

## 2015-09-23 MED ORDER — LEVALBUTEROL TARTRATE 45 MCG/ACT IN AERO: 1.0000 | INHALATION_SPRAY | RESPIRATORY_TRACT | Status: DC | PRN

## 2015-09-23 NOTE — Progress Notes (Signed)
Enoxaparin 40 mg subcutaneously once daily entered for DVT prophylaxis. Order was changed to 30 mg once daily based on renal function per anticoagulation policy.  Linda Hart Linda Hart 12:28 PM

## 2015-09-23 NOTE — Care Management (Signed)
Patient presents from independent living at the [facility].  She was independent in her adls.  Current need for 02 is acute.  Admitted with congestive heart failure

## 2015-09-23 NOTE — ED Notes (Signed)
MD at bedside. 

## 2015-09-23 NOTE — H&P (Signed)
[place] [place] NAME: Linda Hart    MR#:  [id number]  DATE OF BIRTH:  06/30/1925  DATE OF ADMISSION:  09/23/2015  PRIMARY CARE PHYSICIAN: Ronette Deter, MD   REQUESTING/REFERRING PHYSICIAN: Dr. Reita Cliche  CHIEF COMPLAINT:  Shortness of breath HISTORY OF PRESENT ILLNESS:  Linda Hart  is a 81 y.o. female with a known history of systolic heart failure with ejection fraction of 30-35% by echo in 2014, SVT and coronary artery disease who presents with above complaint. Patient reports over the past week she's had increasing shortness of breath, however since yesterday and has worsened. She denies lower extremity edema. She denies chest pain, but does report dyspnea exertion. She denies cough, fever or wheezing. In the emergency room chest x-ray showed pulmonary edema and she has received Lasix and a Foley catheter was placed for accurate input and output. She is on 4 L nasal cannula.  PAST MEDICAL HISTORY:   Past Medical History  Diagnosis Date  . Dizziness   . Hypotension   . Coronary artery disease 03/06/13    90% prox LAD stenosis s/p DES  . Ischemic cardiomyopathy 03/09/13    s/p NSTEMI. EF 30-35%.  Marland Kitchen GERD (gastroesophageal reflux disease)   . PAF (paroxysmal atrial fibrillation) ([place]) 03/06/13    In setting of NSTEMI and reperfusion, no recurrence  . SVT (supraventricular tachycardia) (HCC)     Self-limited on outpatient cardiac monitor  . MI (myocardial infarction) ([place])     PAST SURGICAL HISTORY:   Past Surgical History  Procedure Laterality Date  . Cesarean section    . Coronary angioplasty with stent placement  02/2013    DES-mid LAD, mild LCx, RCA stenoses    SOCIAL HISTORY:   Social History  Substance Use Topics  . Smoking status: Never Smoker   . Smokeless tobacco: Never Used  . Alcohol Use: No     Comment: occasional    FAMILY HISTORY:   Family History  Problem Relation Age of Onset  . Heart  failure Mother   . Heart failure Father   . Heart attack Father     DRUG ALLERGIES:   Allergies  Allergen Reactions  . Penicillins     Has patient had a PCN reaction causing immediate rash, facial/tongue/throat swelling, SOB or lightheadedness with hypotension: Unknown Has patient had a PCN reaction causing severe rash involving mucus membranes or skin necrosis: Unknown Has patient had a PCN reaction that required hospitalization No Has patient had a PCN reaction occurring within the last 10 years: No If all of the above answers are "NO", then may proceed with Cephalosporin use.     REVIEW OF SYSTEMS:  CONSTITUTIONAL: No fever, positive fatigue and generalized weakness.  EYES: No blurred or double vision.  EARS, NOSE, AND THROAT: No tinnitus or ear pain.  RESPIRATORY: No cough, positive shortness of breath, no wheezing or hemoptysis.  CARDIOVASCULAR: No chest pain, positive orthopnea, no edema.  GASTROINTESTINAL: No nausea, vomiting, diarrhea or abdominal pain.  GENITOURINARY: No dysuria, hematuria.  ENDOCRINE: No polyuria, nocturia,  HEMATOLOGY: No anemia, easy bruising or bleeding SKIN: No rash or lesion. MUSCULOSKELETAL: No joint pain or arthritis.   NEUROLOGIC: No tingling, numbness, weakness.  PSYCHIATRY: No anxiety or depression.   MEDICATIONS AT HOME:   Prior to Admission medications   Medication Sig Start Date End Date Taking? Authorizing Provider  furosemide (LASIX) 40 MG tablet Take 40 mg by mouth daily as needed for fluid.  Yes Historical Provider, MD  aspirin 81 MG tablet Take 81 mg by mouth daily.      Historical Provider, MD  atorvastatin (LIPITOR) 20 MG tablet Take 1 tablet (20 mg total) by mouth daily at 6 PM. Patient taking differently: Take 20 mg by mouth at bedtime.  02/23/15   Jackolyn Confer, MD  carvedilol (COREG) 3.125 MG tablet TAKE ONE TABLET TWICE A DAY WITH MEALS. 05/09/15   Jackolyn Confer, MD  chlorpheniramine-HYDROcodone (TUSSIONEX) 10-8  MG/5ML LQCR Take 5 mLs by mouth every 12 (twelve) hours as needed. Patient not taking: Reported on 09/23/2015 11/08/14   Jackolyn Confer, MD  cyanocobalamin (,VITAMIN B-12,) 1000 MCG/ML injection INJECT 1 ML IM EACH MONTH 09/22/15   Jackolyn Confer, MD  levalbuterol [facility] HFA) 45 MCG/ACT inhaler Inhale 1-2 puffs into the lungs every 4 (four) hours as needed for wheezing.    Historical Provider, MD  loratadine (CLARITIN) 10 MG tablet Take 1 tablet (10 mg total) by mouth daily. 01/04/14   Jackolyn Confer, MD  nitroGLYCERIN (NITROSTAT) 0.4 MG SL tablet Place 0.4 mg under the tongue every 5 (five) minutes as needed for chest pain.    Historical Provider, MD  ticagrelor (BRILINTA) 60 MG TABS tablet Take 1 tablet (60 mg total) by mouth 2 (two) times daily. 01/19/15   Minna Merritts, MD      VITAL SIGNS:  Blood pressure 116/50, pulse 87, temperature 97.7 F (36.5 C), temperature source Oral, resp. rate 28, weight 52.164 kg (115 lb), SpO2 99 %.  PHYSICAL EXAMINATION:  GENERAL:  81 y.o.-year-old patient lying in the bed with no acute distress.  EYES: Pupils equal, round, reactive to light and accommodation. No scleral icterus. Extraocular muscles intact.  HEENT: Head atraumatic, normocephalic. Oropharynx and nasopharynx clear.  NECK:  Supple, no jugular venous distention. No thyroid enlargement, no tenderness.  LUNGS: No use of accessory muscles however she does have bilateral crackles at the lower bases. No rales or rhonchi heard no wheezing  CARDIOVASCULAR: S1, S2 normal. No murmurs, rubs, or gallops.  ABDOMEN: Soft, nontender, nondistended. Bowel sounds present. No organomegaly or mass.  EXTREMITIES: No pedal edema, cyanosis, or clubbing.  NEUROLOGIC: Cranial nerves II through XII are grossly intact. No focal deficits. PSYCHIATRIC: The patient is alert and oriented x 3.  SKIN: No obvious rash, lesion, or ulcer.   LABORATORY PANEL:   CBC  Recent Labs Lab 09/23/15 0742  WBC 19.5*   HGB 10.9*  HCT 34.3*  PLT 360   ------------------------------------------------------------------------------------------------------------------  Chemistries   Recent Labs Lab 09/23/15 0742  NA 135  K 4.9  CL 106  CO2 21*  GLUCOSE 192*  BUN 22*  CREATININE 1.47*  CALCIUM 8.6*  AST 34  ALT 16  ALKPHOS 107  BILITOT 1.0   ------------------------------------------------------------------------------------------------------------------  Cardiac Enzymes  Recent Labs Lab 09/23/15 0742  TROPONINI 0.04*   ------------------------------------------------------------------------------------------------------------------  RADIOLOGY:  Dg Chest Port 1 View  09/23/2015  CLINICAL DATA:  Cough.  Shortness of breath. EXAM: PORTABLE CHEST 1 VIEW COMPARISON:  03/11/2013. FINDINGS: Mediastinum and hilar structures are normal. Cardiomegaly pulmonary venous congestion bilateral pulmonary interstitial prominence with Kerley B-lines. Small bilateral effusions. Findings suggest congestive heart failure. No pneumothorax. Thoracic spine degenerative changes scoliosis. IMPRESSION: Findings consistent with congestive heart failure with mild pulmonary interstitial edema and small bilateral effusions. Electronically Signed   By: Marcello Moores  Register   On: 09/23/2015 07:55    EKG:   Sinus tachycardia heart rate of 111 no ST  elevation or depression. She does have T-wave inversions in the anteroseptal leads.  IMPRESSION AND PLAN:    81 year old female with a history of coronary artery disease and skin a cardiomyopathy with an ejection fraction of 30-35% presents with acute hypoxic respiratory failure.  1. Acute hypoxic respiratory failure: Chest x-ray is concerning for pulmonary edema with acute on chronic CHF exacerbation. She is currently requiring oxygen which she does not use at home. She also has an elevated white blood cell count and elevated lactic acid which is concerning for sepsis. There  may be an underlying pneumonia that we cannot quite see due to the haziness on the chest x-ray from the pulmonary edema. I am empirically starting Levaquin and have ordered blood cultures.  2. Acute on chronic systolic heart failure: Patient will be treated with IV Lasix and close monitoring of input and output. Her last echocardiogram was in 2014 therefore I will repeat an echocardiogram. If needed we can consult cardiology. She sees Dr. Rockey Situ. I have started low dose ACE inhibitor and she will continue Coreg 3.125 mg by mouth twice a day.   3. Hyperlipidemia: Continue Lipitor  4. Paroxysmal atrial fibrillation: Patient continues to be in sinus rhythm at this time.    All the records are reviewed and case discussed with ED provider. Management plans discussed with the patient and she is in agreement.  CODE STATUS: FULL  TOTAL TIME TAKING CARE OF THIS PATIENT: 50 minutes.    Berenise Hunton M.D on 09/23/2015 at 11:06 AM  Between 7am to 6pm - Pager - [phone] After 6pm go to [email or web address] - password [email or web address] [facility]  [facility]  Office  [phone]  CC: Primary care physician; Ronette Deter, MD

## 2015-09-23 NOTE — Progress Notes (Signed)
MD Benjie Karvonen was notified of Trop 0.26. No further orders.

## 2015-09-23 NOTE — Progress Notes (Signed)
2 L of oxygen. NSR. Pt has not reported any pain. Takes meds ok. High fall risk. A& O. Family at the bedside.Foley.BNP elevated. IV abx. Pt has no further concerns at this time.

## 2015-09-23 NOTE — ED Provider Notes (Signed)
[facility] Emergency Department Provider Note   ____________________________________________  Time seen: On arrival to ED room I have reviewed the triage vital signs and the triage nursing note.  HISTORY  Chief Complaint Shortness of Breath   Historian Limited historian due to extreme dyspnea/respiratory distress   HPI Linda Hart is a 81 y.o. female is here with shortness of breath which sounds like may have started yesterday. She states she had a nonproductive cough. She's had shortness of breath. She does not have any chest pain. Denies fever. No abdominal pain. Denies history of CHF. Does have a history of coronary artery disease with history of MI in the past.Shortness of breath is moderate to severe. She has a panic feeling about not being able to breathe.    Past Medical History  Diagnosis Date  . Dizziness   . Hypotension   . Coronary artery disease 03/06/13    90% prox LAD stenosis s/p DES  . Ischemic cardiomyopathy 03/09/13    s/p NSTEMI. EF 30-35%.  Marland Kitchen GERD (gastroesophageal reflux disease)   . PAF (paroxysmal atrial fibrillation) ([place]) 03/06/13    In setting of NSTEMI and reperfusion, no recurrence  . SVT (supraventricular tachycardia) (HCC)     Self-limited on outpatient cardiac monitor  . MI (myocardial infarction) [facility])     Patient Active Problem List   Diagnosis Date Noted  . Hematoma of left lower leg 09/12/2015  . Nosebleed 09/12/2015  . Orthostatic hypotension 07/26/2015  . Viral URI with cough 11/08/2014  . Anemia 01/26/2014  . Dry eyes 01/04/2014  . Shortness of breath 12/23/2013  . Cardiomyopathy, ischemic 03/17/2013  . Hyperlipidemia 03/17/2013  . CAD in native artery 03/17/2013  . PAF (paroxysmal atrial fibrillation) ([place]) 03/17/2013  . Hypertension 02/14/2012  . Pernicious anemia 07/10/2011  . SVT (supraventricular tachycardia) ([place]) 03/21/2011  . Depression 03/21/2011  . PALPITATIONS 08/28/2010    Past Surgical  History  Procedure Laterality Date  . Cesarean section    . Coronary angioplasty with stent placement  02/2013    DES-mid LAD, mild LCx, RCA stenoses    Current Outpatient Rx  Name  Route  Sig  Dispense  Refill  . furosemide (LASIX) 40 MG tablet   Oral   Take 40 mg by mouth daily as needed for fluid.         Marland Kitchen aspirin 81 MG tablet   Oral   Take 81 mg by mouth daily.           Marland Kitchen atorvastatin (LIPITOR) 20 MG tablet   Oral   Take 1 tablet (20 mg total) by mouth daily at 6 PM. Patient taking differently: Take 20 mg by mouth at bedtime.    90 tablet   3   . carvedilol (COREG) 3.125 MG tablet      TAKE ONE TABLET TWICE A DAY WITH MEALS.   60 tablet   6   . chlorpheniramine-HYDROcodone (TUSSIONEX) 10-8 MG/5ML LQCR   Oral   Take 5 mLs by mouth every 12 (twelve) hours as needed. Patient not taking: Reported on 09/23/2015   140 mL   0   . cyanocobalamin (,VITAMIN B-12,) 1000 MCG/ML injection      INJECT 1 ML IM EACH MONTH   10 mL   3   . levalbuterol (XOPENEX HFA) 45 MCG/ACT inhaler   Inhalation   Inhale 1-2 puffs into the lungs every 4 (four) hours as needed for wheezing.         Marland Kitchen  loratadine (CLARITIN) 10 MG tablet   Oral   Take 1 tablet (10 mg total) by mouth daily.   30 tablet   11   . nitroGLYCERIN (NITROSTAT) 0.4 MG SL tablet   Sublingual   Place 0.4 mg under the tongue every 5 (five) minutes as needed for chest pain.         . ticagrelor (BRILINTA) 60 MG TABS tablet   Oral   Take 1 tablet (60 mg total) by mouth 2 (two) times daily.   60 tablet   11     Allergies Penicillins  Family History  Problem Relation Age of Onset  . Heart failure Mother   . Heart failure Father   . Heart attack Father     Social History Social History  Substance Use Topics  . Smoking status: Never Smoker   . Smokeless tobacco: Never Used  . Alcohol Use: No     Comment: occasional    Review of Systems  Constitutional: Negative for fever. Eyes: Negative  for visual changes. ENT: Negative for sore throat. Cardiovascular: Negative for chest pain. Respiratory: Positive for shortness of breath. Gastrointestinal: Negative for abdominal pain, vomiting and diarrhea. Genitourinary: Negative for dysuria. Musculoskeletal: Negative for back pain. Skin: Negative for rash. Neurological: Negative for headache. 10 point Review of Systems otherwise negative ____________________________________________   PHYSICAL EXAM:  VITAL SIGNS: ED Triage Vitals  Enc Vitals Group     BP 09/23/15 0718 185/83 mmHg     Pulse Rate 09/23/15 0718 108     Resp 09/23/15 0718 32     Temp 09/23/15 0718 97.7 F (36.5 C)     Temp Source 09/23/15 0718 Oral     SpO2 09/23/15 0718 100 %     Weight 09/23/15 0718 115 lb (52.164 kg)     Height --      Head Cir --      Peak Flow --      Pain Score --      Pain Loc --      Pain Edu? --      Excl. in [place]? --      Constitutional: Alert and oriented. Moderate respiratory distress.. Eyes: Conjunctivae are normal. PERRL. Normal extraocular movements. ENT   Head: Normocephalic and atraumatic.   Nose: No congestion/rhinnorhea.   Mouth/Throat: Mucous membranes are moist.   Neck: No stridor. Cardiovascular/Chest: Tachycardic, regular.  No murmurs, rubs, or gallops. Respiratory: Tachypnea, no retractions. Overall decreased air movement in all fields. Mild posterior rhonchi. Gastrointestinal: Soft. No distention, no guarding, no rebound. Nontender   Genitourinary/rectal:Deferred Musculoskeletal: Nontender with normal range of motion in all extremities. No joint effusions.  No lower extremity tenderness.  1+ lower extremity edema bilaterally. Neurologic:  Normal speech and language. No gross or focal neurologic deficits are appreciated. Skin:  Skin is warm, dry and intact. No rash noted.   ____________________________________________   EKG I, Lisa Roca, MD, the attending physician have personally viewed and  interpreted all ECGs.  Sinus tachycardia. 111 bpm. Narrow QRS. Normal axis. Nonspecific ST and T-wave ____________________________________________  LABS (pertinent positives/negatives)  White blood count 19.5 with left shift, hemoglobin 10.9, platelet count 360 BNP 1298 Lactic acid 3.4 Urinalysis negative for infection Comprehensive metabolic panel significant for BUN 22 and creatinine 1.47 Troponin 0.04  ____________________________________________  RADIOLOGY All Xrays were viewed by me. Imaging interpreted by Radiologist.  Chest x-ray portable:  IMPRESSION: Findings consistent with congestive heart failure with mild pulmonary interstitial edema and small bilateral effusions. __________________________________________  PROCEDURES  Procedure(s) performed: None  Critical Care performed: CRITICAL CARE Performed by: Lisa Roca   Total critical care time: 30 minutes  Critical care time was exclusive of separately billable procedures and treating other patients.  Critical care was necessary to treat or prevent imminent or life-threatening deterioration.  Critical care was time spent personally by me on the following activities: development of treatment plan with patient and/or surrogate as well as nursing, discussions with consultants, evaluation of patient's response to treatment, examination of patient, obtaining history from patient or surrogate, ordering and performing treatments and interventions, ordering and review of laboratory studies, ordering and review of radiographic studies, pulse oximetry and re-evaluation of patient's condition.   ____________________________________________   ED COURSE / ASSESSMENT AND PLAN  CONSULTATIONS: Hospitalist for admission  Pertinent labs & imaging results that were available during my care of the patient were reviewed by me and considered in my medical decision making (see chart for details).  Patient arrived with complaint  of severe dyspnea with increased respiratory rate and on 4 L nasal cannula. No documented hypoxia, however the patient does have tachycardia around 105. After patient checked in she did seem a little bit more comfortable with heart rate around 90 and still on nasal came oxygen, and respiratory rate around 30.  I did try to take her off of oxygen to check for hypoxia, and after about 3 minutes her O2 sat went from 100% to about 95% and she became very dyspneic with increased respiratory rate to the upper 40s, and became tachycardic to about 120. She was placed back on 4 L nasal cannula.  Clinically she looks to be in volume overload in terms of lower extremity edema and lung sounds, and her chest x-ray shows no infiltrates or initially considered treating her for congestive heart failure exacerbation. She was given IV Lasix. If needed will place on BiPAP.  White blood cell count did come back elevated at 19, and lactate came back elevated at 3.4, wheezing concerned about the possibility for sepsis, and so given that her symptoms are respiratory, I will cover her with vancomycin and Tressie Ellis for the possibility of sepsis.  Clinically I still think congestive heart failure is the main source of her respiratory distress, and although her lactate is elevated to 3.4, I'm concerned about adding fluids at this point in time given her tenuous respiratory status from congestive heart failure. Fluid bolus was not started at this point time given that congestive heart failure still to be the most likely clinical source of her respiratory distress.  Hospital is consulted for admission.   Patient / Family / Caregiver informed of clinical course, medical decision-making process, and agree with plan.   ___________________________________________   FINAL CLINICAL IMPRESSION(S) / ED DIAGNOSES   Final diagnoses:  Acute on chronic congestive heart failure, unspecified congestive heart failure type ([place])  Sepsis  due to undetermined organism with acute respiratory failure ([place])       Lisa Roca, MD 09/23/15 [phone]

## 2015-09-23 NOTE — Progress Notes (Signed)
AVISHKA, STATLER ([id number]) Visit Report for 09/21/2015 Chief Complaint Document Details Patient Name: Linda Hart, Linda Hart Date of Service: 09/21/2015 8:45 AM Medical Record Patient Account Number: [id number] Number: Treating RN: Baruch Gouty, RN, BSN, Rita 12-08-1924 [phone] y.o. Other Clinician: Date of Birth/Sex: Female) Treating BURNS III, Primary Care Physician/Extender: Georgetta Haber, Anderson Malta Physician: Referring Physician: Burgess Amor in Treatment: 0 Information Obtained from: Patient Chief Complaint Left calf traumatic hematoma and ulceration. Electronic Signature(s) Signed: 09/21/2015 3:24:44 PM By: Loletha Grayer MD Entered By: Loletha Grayer on 09/21/2015 09:50:04 MARIANTHI, FLORESTAL ([id number]) -------------------------------------------------------------------------------- Debridement Details Patient Name: Linda Hart Date of Service: 09/21/2015 8:45 AM Medical Record Patient Account Number: [id number] Number: Treating RN: Baruch Gouty, RN, BSN, Rita 1925/09/04 [phone] y.o. Other Clinician: Date of Birth/Sex: Female) Treating BURNS III, Primary Care Physician/Extender: Georgetta Haber, Anderson Malta Physician: Referring Physician: Burgess Amor in Treatment: 0 Debridement Performed for Wound #1 Left,Anterior Lower Leg Assessment: Performed By: Physician BURNS III, Teressa Senter., MD Debridement: Debridement Pre-procedure Yes Verification/Time Out Taken: Start Time: 09:40 Pain Control: Lidocaine 4% Topical Solution Level: Skin/Subcutaneous Tissue Total Area Debrided (L x 2 (cm) x 3 (cm) = 6 (cm) W): Tissue and other Viable, Non-Viable, Blood Clots, Eschar, Fat, Fibrin/Slough, Skin, material debrided: Subcutaneous Instrument: Forceps, Scissors Bleeding: Minimum Hemostasis Achieved: Pressure End Time: 09:45 Procedural Pain: 0 Post Procedural Pain: 0 Response to Treatment: Procedure was tolerated well Post Debridement Measurements of  Total Wound Length: (cm) 2 Width: (cm) 3 Depth: (cm) 0.8 Volume: (cm) 3.77 Post Procedure Diagnosis Same as Pre-procedure Electronic Signature(s) Signed: 09/21/2015 3:24:44 PM By: Loletha Grayer MD Signed: 09/21/2015 5:00:53 PM By: Regan Lemming BSN, RN Previous Signature: 09/21/2015 9:42:12 AM Version By: Regan Lemming BSN, RN Entered By: Loletha Grayer on 09/21/2015 09:49:48 LAKYIA, FURMANSKI ([id number]) MEIA, DALSANTO ([id number]) -------------------------------------------------------------------------------- HPI Details Patient Name: Linda Hart Date of Service: 09/21/2015 8:45 AM Medical Record Patient Account Number: [id number] Number: Treating RN: Baruch Gouty, RN, BSN, Rita May 09, 1925 (81 y.o. Other Clinician: Date of Birth/Sex: Female) Treating BURNS III, Primary Care Physician/Extender: Georgetta Haber, Anderson Malta Physician: Referring Physician: Burgess Amor in Treatment: 0 History of Present Illness HPI Description: Pleasant 81 year old with history of coronary artery disease, status post MI and stent. No history of diabetes or PAD. Left ABI 0.89. She fell and hit her left anterior calf on a brick step in October 2016. She developed a hematoma and subsequent ulceration. She says that this is slowly improving. Ambulating with a cane. No claudication or rest pain. Not performing any dressing changes. No antibiotics. No fever or chills. Minimal dark bloody drainage. Electronic Signature(s) Signed: 09/21/2015 3:24:44 PM By: Loletha Grayer MD Entered By: Loletha Grayer on 09/21/2015 09:51:53 CRYSTELLE, LANGS ([id number]) -------------------------------------------------------------------------------- Physical Exam Details Patient Name: Linda Hart Date of Service: 09/21/2015 8:45 AM Medical Record Patient Account Number: [id number] Number: Treating RN: Baruch Gouty, RN, BSN, Rita 1925/06/02 [phone] y.o. Other Clinician: Date of  Birth/Sex: Female) Treating BURNS III, Primary Care Physician/Extender: Georgetta Haber, Anderson Malta Physician: Referring Physician: Burgess Amor in Treatment: 0 Constitutional . Pulse regular. Respirations normal and unlabored. Afebrile. Marland Kitchen Respiratory WNL. No retractions.. Cardiovascular Pedal Pulses WNL. Integumentary (Hair, Skin) .Marland Kitchen Neurological Sensation normal to touch, pin,and vibration. Psychiatric Judgement and insight Intact.. Oriented times 3.. No evidence of depression, anxiety, or agitation.. Notes Left anterior calf hematoma covered with moist eschar. Sharply excised. Hematoma evacuated. Healthy underlying soft tissue. No exposed deep structures. No probing to bone. No cellulitis. Localized edema. Palpable DP. Left ABI 0.89.  Electronic Signature(s) Signed: 09/21/2015 3:24:44 PM By: Loletha Grayer MD Entered By: Loletha Grayer on 09/21/2015 09:52:46 LAMAE, SEKERAK ([id number]) -------------------------------------------------------------------------------- Physician Orders Details Patient Name: Linda Hart Date of Service: 09/21/2015 8:45 AM Medical Record Patient Account Number: [id number] Number: Treating RN: Baruch Gouty, RN, BSN, Rita 08-23-1925 [phone] y.o. Other Clinician: Date of Birth/Sex: Female) Treating BURNS III, Primary Care Physician/Extender: Georgetta Haber, Anderson Malta Physician: Referring Physician: Burgess Amor in Treatment: 0 Verbal / Phone Orders: Yes Clinician: Afful, RN, BSN, Rita Read Back and Verified: Yes Diagnosis Coding Wound Cleansing Wound #1 Left,Anterior Lower Leg o Cleanse wound with mild soap and water o May Shower, gently pat wound dry prior to applying new dressing. Anesthetic Wound #1 Left,Anterior Lower Leg o Topical Lidocaine 4% cream applied to wound bed prior to debridement Primary Wound Dressing Wound #1 Left,Anterior Lower Leg o Aquacel Ag Secondary Dressing Wound #1  Left,Anterior Lower Leg o Boardered Foam Dressing Dressing Change Frequency Wound #1 Left,Anterior Lower Leg o Change dressing every other day. Follow-up Appointments Wound #1 Left,Anterior Lower Leg o Return Appointment in 1 week. Edema Control Wound #1 Left,Anterior Lower Leg o Tubigrip - size E Electronic Signature(s) Signed: 09/21/2015 9:47:21 AM By: Regan Lemming BSN, RN [place], Alizay ([id number]) Signed: 09/21/2015 3:24:44 PM By: Loletha Grayer MD Entered By: Regan Lemming on 09/21/2015 09:47:21 YULANDA, RESENDEZ ([id number]) -------------------------------------------------------------------------------- Problem List Details Patient Name: Linda Hart Date of Service: 09/21/2015 8:45 AM Medical Record Patient Account Number: [id number] Number: Treating RN: Baruch Gouty, RN, BSN, Rita June 03, 1925 (81 y.o. 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 Other Clinician: Date of Birth/Sex: Female) Treating BURNS III, Primary Care  Physician/Extender: Georgetta Haber, Anderson Malta Physician: Referring Physician: Burgess Amor in Treatment: 0 Subjective Chief Complaint Information obtained from Patient Left calf traumatic hematoma and ulceration. History of Present Illness (HPI) Pleasant 81 year old with history of coronary artery disease, status post MI and stent. No history of diabetes or PAD. Left ABI 0.89. She fell and hit her left anterior calf on a brick step in October 2016. She developed a hematoma and subsequent ulceration. She says that this is slowly improving. Ambulating with a cane. No claudication or rest pain. Not performing any dressing changes. No antibiotics. No fever or chills. Minimal dark bloody drainage. Wound History Patient presents with 1 open wound that has been present for approximately 3weeks. Patient has been treating wound in the following manner: dry dressing. Laboratory tests have not been performed in the last month. Patient reportedly has not tested positive for an antibiotic resistant organism. Patient reportedly has not had testing performed to evaluate circulation in the legs. Patient History Information obtained from Patient. Allergies PCN Family History Heart Disease - Father, No family history of Cancer, Diabetes, Hereditary Spherocytosis, Hypertension, Kidney Disease, Lung Disease, Seizures, Stroke, Thyroid Problems, Tuberculosis. Social History Never smoker,  Marital Status - Divorced, Alcohol Use - Never, Drug Use - No History, Caffeine Use - Moderate. XCARET, TIMPERLEY ([id number]) Medical History Eyes Patient has history of Cataracts - remove Ear/Nose/Mouth/Throat Denies history of Chronic sinus problems/congestion, Middle ear problems Hematologic/Lymphatic Denies history of Anemia, Hemophilia, Human Immunodeficiency Virus, Lymphedema, Sickle Cell Disease Respiratory Denies history of Aspiration, Asthma, Chronic Obstructive Pulmonary Disease (COPD),  Pneumothorax, Sleep Apnea, Tuberculosis Cardiovascular Patient has history of Arrhythmia, Hypertension Gastrointestinal Denies history of Cirrhosis , Colitis, Crohn s, Hepatitis A, Hepatitis B, Hepatitis C Endocrine Denies history of Type I Diabetes, Type II Diabetes Genitourinary Denies history of End Stage Renal Disease Immunological Denies history of Lupus Erythematosus, Raynaud s, Scleroderma Integumentary (Skin) Denies history of History of Burn, History of pressure wounds Musculoskeletal Patient has history of Osteoarthritis Denies history of Gout, Rheumatoid Arthritis, Osteomyelitis Neurologic Denies history of Dementia, Neuropathy, Quadriplegia, Paraplegia, Seizure Disorder Oncologic Denies history of Received Chemotherapy, Received Radiation Psychiatric Denies history of Anorexia/bulimia, Confinement Anxiety Medical And Surgical History Notes Cardiovascular high cholesterol Review of Systems (ROS) Constitutional Symptoms (General Health) The patient has no complaints or symptoms. Eyes Complains or has symptoms of Glasses / Contacts. Ear/Nose/Mouth/Throat The patient has no complaints or symptoms. Hematologic/Lymphatic The patient has no complaints or symptoms. Respiratory The patient has no complaints or symptoms. Cardiovascular The patient has no complaints or symptoms. Gastrointestinal The patient has no complaints or symptoms. NILEY, ASSENMACHER ([id number]) Endocrine The patient has no complaints or symptoms. Genitourinary The patient has no complaints or symptoms. Immunological The patient has no complaints or symptoms. Integumentary (Skin) Complains or has symptoms of Wounds. Musculoskeletal The patient has no complaints or symptoms. Neurologic The patient has no complaints or symptoms. Oncologic The patient has no complaints or symptoms. Psychiatric The patient has no complaints or symptoms. Objective Constitutional Pulse regular. Respirations  normal and unlabored. Afebrile. Vitals Time Taken: 9:09 AM, Height: 61 in, Source: Stated, Weight: 106 lbs, Source: Stated, BMI: 20, Pulse: 86 bpm, Respiratory Rate: 18 breaths/min, Blood Pressure: 146/56 mmHg. Respiratory WNL. No retractions.. Cardiovascular Pedal Pulses WNL. Neurological Sensation normal to touch, pin,and vibration. Psychiatric Judgement and insight Intact.. Oriented times 3.. No evidence of depression, anxiety, or agitation.. General Notes: Left anterior calf hematoma covered with moist eschar. Sharply excised. Hematoma evacuated. Healthy underlying soft tissue. No exposed deep structures. No probing to bone. No cellulitis. Localized edema. Palpable DP. Left ABI 0.89. Integumentary (Hair, Skin) Goodner, Thaily ([id number]) Wound #1 status is Open. Original cause of wound was Trauma. The wound is located on the Left,Anterior Lower Leg. The wound measures 2cm length x 3cm width x 0.1cm depth; 4.712cm^2 area and 0.471cm^3 volume. The wound is limited to skin breakdown. There is no tunneling or undermining noted. There is a small amount of serosanguineous drainage noted. The wound margin is indistinct and nonvisible. There is no granulation within the wound bed. There is a large (67-100%) amount of necrotic tissue within the wound bed including Eschar. The periwound skin appearance exhibited: Localized Edema, Moist. The periwound skin appearance did not exhibit: Callus, Crepitus, Excoriation, Fluctuance, Friable, Induration, Rash, Scarring, Dry/Scaly, Maceration, Atrophie Blanche, Cyanosis, Ecchymosis, Hemosiderin Staining, Mottled, Pallor, Rubor, Erythema. Periwound temperature was noted as No Abnormality. Assessment Active Problems ICD-10 S81.802A - Unspecified open wound, left lower leg, initial encounter L97.222 - Non-pressure chronic ulcer of left calf with fat layer exposed R60.0 - Localized edema Left anterior calf traumatic hematoma and  ulceration. Procedures Wound #1 Wound #1 is a Trauma, Other located on the Left,Anterior Lower Leg .  There was a Skin/Subcutaneous Tissue Debridement [id number]) debridement with total area of 6 sq cm performed by BURNS III, Teressa Senter., MD. with the following instrument(s): Forceps and Scissors to remove Viable and Non-Viable tissue/material including Blood Clots, Fat, Fibrin/Slough, Eschar, Skin, and Subcutaneous after achieving pain control using Lidocaine 4% Topical Solution. A time out was conducted prior to the start of the procedure. A Minimum amount of bleeding was controlled with Pressure. The procedure was tolerated well with a pain level of 0 throughout and a pain level of 0 following the procedure. Post Debridement Measurements: 2cm length x 3cm width x 0.8cm depth; 3.77cm^3 volume. Post procedure Diagnosis Wound #1: Same as Pre-Procedure Wierman, Shalana ([id number]) Plan Wound Cleansing: Wound #1 Left,Anterior Lower Leg: Cleanse wound with mild soap and water May Shower, gently pat wound dry prior to applying new dressing. Anesthetic: Wound #1 Left,Anterior Lower Leg: Topical Lidocaine 4% cream applied to wound bed prior to debridement Primary Wound Dressing: Wound #1 Left,Anterior Lower Leg: Aquacel Ag Secondary Dressing: Wound #1 Left,Anterior Lower Leg: Boardered Foam Dressing Dressing Change Frequency: Wound #1 Left,Anterior Lower Leg: Change dressing every other day. Follow-up Appointments: Wound #1 Left,Anterior Lower Leg: Return Appointment in 1 week. Edema Control: Wound #1 Left,Anterior Lower Leg: Tubigrip - size E Silver alginate dressing changes daily. Tubigrip for edema control. No antibiotics indicated at this time. Electronic Signature(s) Signed: 09/21/2015 3:24:44 PM By: Loletha Grayer MD Entered By: Loletha Grayer on 09/21/2015 09:53:58 SULI, DANEK  ([id number]) -------------------------------------------------------------------------------- ROS/PFSH Details Patient Name: Linda Hart Date of Service: 09/21/2015 8:45 AM Medical Record Patient Account Number: [id number] Number: Treating RN: Baruch Gouty, RN, BSN, Rita 1925/05/15 [phone] y.o. Other Clinician: Date of Birth/Sex: Female) Treating BURNS III, Primary Care Physician/Extender: Georgetta Haber, Anderson Malta Physician: Referring Physician: Burgess Amor in Treatment: 0 Label Progress Note Print Version as History and Physical for this encounter Information Obtained From Patient Wound History Do you currently have one or more open woundso Yes How many open wounds do you currently haveo 1 Approximately how long have you had your woundso 3weeks How have you been treating your wound(s) until nowo dry dressing Has your wound(s) ever healed and then re-openedo No Have you had any lab work done in the past montho No Have you tested positive for an antibiotic resistant organism (MRSA, VRE)o No Have you had any tests for circulation on your legso No Eyes Complaints and Symptoms: Positive for: Glasses / Contacts Medical History: Positive for: Cataracts - remove Integumentary (Skin) Complaints and Symptoms: Positive for: Wounds Medical History: Negative for: History of Burn; History of pressure wounds Constitutional Symptoms (General Health) Complaints and Symptoms: No Complaints or Symptoms Ear/Nose/Mouth/Throat Complaints and Symptoms: No Complaints or Symptoms Aytes, Shardai ([id number]) Medical History: Negative for: Chronic sinus problems/congestion; Middle ear problems Hematologic/Lymphatic Complaints and Symptoms: No Complaints or Symptoms Medical History: Negative for: Anemia; Hemophilia; Human Immunodeficiency Virus; Lymphedema; Sickle Cell Disease Respiratory Complaints and Symptoms: No Complaints or Symptoms Medical History: Negative for:  Aspiration; Asthma; Chronic Obstructive Pulmonary Disease (COPD); Pneumothorax; Sleep Apnea; Tuberculosis Cardiovascular Complaints and Symptoms: No Complaints or Symptoms Medical History: Positive for: Arrhythmia; Hypertension Past Medical History Notes: high cholesterol Gastrointestinal Complaints and Symptoms: No Complaints or Symptoms Medical History: Negative for: Cirrhosis ; Colitis; Crohnos; Hepatitis A; Hepatitis B; Hepatitis C Endocrine Complaints and Symptoms: No Complaints or Symptoms Medical History: Negative for: Type I Diabetes; Type II Diabetes Genitourinary Complaints and Symptoms: No Complaints or Symptoms Medical HistoryRENATTA, YOUSEFI ([id number]) Negative for: End Stage  Renal Disease Immunological Complaints and Symptoms: No Complaints or Symptoms Medical History: Negative for: Lupus Erythematosus; Raynaudos; Scleroderma Musculoskeletal Complaints and Symptoms: No Complaints or Symptoms Medical History: Positive for: Osteoarthritis Negative for: Gout; Rheumatoid Arthritis; Osteomyelitis Neurologic Complaints and Symptoms: No Complaints or Symptoms Medical History: Negative for: Dementia; Neuropathy; Quadriplegia; Paraplegia; Seizure Disorder Oncologic Complaints and Symptoms: No Complaints or Symptoms Medical History: Negative for: Received Chemotherapy; Received Radiation Psychiatric Complaints and Symptoms: No Complaints or Symptoms Medical History: Negative for: Anorexia/bulimia; Confinement Anxiety HBO Extended History Items Eyes: Cataracts Family and Social History Cancer: No; Diabetes: No; Heart Disease: Yes - Father; Hereditary Spherocytosis: No; Hypertension: No; Kidney Disease: No; Lung Disease: No; Seizures: No; Stroke: No; Thyroid Problems: No; Tuberculosis: No; Never smoker; Marital Status - Divorced; Alcohol Use: Never; Drug Use: No History; Caffeine Use: Remus, Alverta ([id number]) Moderate; Financial Concerns: No; Food,  Clothing or Shelter Needs: No; Support System Lacking: No; Transportation Concerns: No; Advanced Directives: No; Patient does not want information on Advanced Directives; Living Will: No Physician Affirmation I have reviewed and agree with the above information. Electronic Signature(s) Signed: 09/21/2015 3:24:44 PM By: Loletha Grayer MD Signed: 09/21/2015 5:00:53 PM By: Regan Lemming BSN, RN Previous Signature: 09/21/2015 9:17:05 AM Version By: Regan Lemming BSN, RN Entered By: Loletha Grayer on 09/21/2015 09:53:23 CASSANDER, SIV ([id number]) -------------------------------------------------------------------------------- SuperBill Details Patient Name: Linda Hart Date of Service: 09/21/2015 Medical Record Patient Account Number: [id number] Number: Treating RN: Baruch Gouty, RN, BSN, Rita Jan 21, 1925 (81 y.o. Other Clinician: Date of Birth/Sex: Female) Treating BURNS III, Primary Care Physician/Extender: Georgetta Haber, Anderson Malta Physician: Suella [place] in Treatment: 0 Referring Physician: Ronette Deter Diagnosis Coding ICD-10 Codes Code Description [phone] Unspecified open wound, left lower leg, initial encounter L97.222 Non-pressure chronic ulcer of left calf with fat layer exposed R60.0 Localized edema Facility Procedures CPT4 Code: [id number] Description: [facility] VISIT-LEV 3 EST PT Modifier: Quantity: 1 CPT4 Code: [id number] Description: [id number] - DEB SUBQ TISSUE 20 SQ CM/< ICD-10 Description Diagnosis L97.222 Non-pressure chronic ulcer of left calf with fat l Modifier: ayer exposed Quantity: 1 Physician Procedures CPT4 Code: [id number] Description: [id number] - WC PHYS LEVEL 4 - NEW PT ICD-10 Description Diagnosis S81.802A Unspecified open wound, left lower leg, initial en L97.222 Non-pressure chronic ulcer of left calf with fat l R60.0 Localized edema Modifier: counter ayer exposed Quantity: 1 CPT4 Code: [id number] Description: [id number] - WC PHYS SUBQ TISS 20 SQ CM  ICD-10 Description Diagnosis L97.222 Non-pressure chronic ulcer of left calf with fat l Modifier: ayer exposed Quantity: 1 Electronic Signature(s) Signed: 09/21/2015 3:24:44 PM By: Loletha Grayer MD [place], Bartolo Darter ([id number]) Entered By: Loletha Grayer on 09/21/2015 09:54:17

## 2015-09-23 NOTE — Consult Note (Addendum)
WOC wound consult note Reason for Consult:full thickness wound on left LE Wound type:traumatic (patient fell against low cement wall on 08/30/15) Pressure Ulcer POA: No Measurement: 2cm x 3cm x 0.4cm Wound bed: ruddy red, moist Drainage (amount, consistency, odor) scant serous on old dressing Periwound: erythematous with evidence of previous wound healing and hemosiderin staining  Dressing procedure/placement/frequency: Seen x1 at the outpatient wound care center at [facility] and is to have another appointment on 09/29/15. Order is for Ca+ Alginate dressing; we will continue that here.  As the wound is not draining as heavily as it perhaps was on their initial assessment, instead of every-other-day, we will implement three times/week or M/W/F dressing changes. Supplies in room. [facility] nursing team will not follow, but will remain available to this patient, the nursing and medical teams.  Please re-consult if needed. Thanks, Maudie Flakes, MSN, RN, [place], Arther Abbott  Pager# [phone]

## 2015-09-23 NOTE — Progress Notes (Signed)
ANTIBIOTIC CONSULT NOTE - INITIAL  Pharmacy Consult for Levaquin Indication: pneumonia  Allergies  Allergen Reactions  . Penicillins     Has patient had a PCN reaction causing immediate rash, facial/tongue/throat swelling, SOB or lightheadedness with hypotension: Unknown Has patient had a PCN reaction causing severe rash involving mucus membranes or skin necrosis: Unknown Has patient had a PCN reaction that required hospitalization No Has patient had a PCN reaction occurring within the last 10 years: No If all of the above answers are "NO", then may proceed with Cephalosporin use.    Patient Measurements: Weight: 115 lb (52.164 kg)  Vital Signs: Temp: 97.7 F (36.5 C) (11/25 0718) Temp Source: Oral (11/25 0718) BP: 128/49 mmHg (11/25 1100) Pulse Rate: 86 (11/25 1100) Intake/Output from previous day:   Intake/Output from this shift:    Labs:  Recent Labs  09/23/15 0742  WBC 19.5*  HGB 10.9*  PLT 360  CREATININE 1.47*   Estimated Creatinine Clearance: 19 mL/min (by C-G formula based on Cr of 1.47). No results for input(s): VANCOTROUGH, VANCOPEAK, VANCORANDOM, GENTTROUGH, GENTPEAK, GENTRANDOM, TOBRATROUGH, TOBRAPEAK, TOBRARND, AMIKACINPEAK, AMIKACINTROU, AMIKACIN in the last 72 hours.   Microbiology: No results found for this or any previous visit (from the past 720 hour(s)).  Medical History: Past Medical History  Diagnosis Date  . Dizziness   . Hypotension   . Coronary artery disease 03/06/13    90% prox LAD stenosis s/p DES  . Ischemic cardiomyopathy 03/09/13    s/p NSTEMI. EF 30-35%.  Marland Kitchen GERD (gastroesophageal reflux disease)   . PAF (paroxysmal atrial fibrillation) ([place]) 03/06/13    In setting of NSTEMI and reperfusion, no recurrence  . SVT (supraventricular tachycardia) (HCC)     Self-limited on outpatient cardiac monitor  . MI (myocardial infarction) ([place])     Medications:  Scheduled:  . aspirin  324 mg Oral Once  . aspirin  81 mg Oral Daily  .  atorvastatin  20 mg Oral q1800  . carvedilol  3.125 mg Oral BID WC  . enoxaparin (LOVENOX) injection  40 mg Subcutaneous Q24H  . [START ON 09/25/2015] levofloxacin (LEVAQUIN) IV  500 mg Intravenous Q48H  . levofloxacin (LEVAQUIN) IV  750 mg Intravenous Once  . lisinopril  5 mg Oral Daily  . loratadine  10 mg Oral Daily  . sodium chloride  3 mL Intravenous Q12H  . ticagrelor  60 mg Oral BID   Infusions:    Assessment: 81 y/o F admitted with CHF and possible PNA.   Plan:  Will order Levaquin 750 mg iv once followed by 500 mg iv q 48 hours. Will continue to follow renal function and culture results and adjust as necessary.   Ulice Dash D 09/23/2015,12:17 PM

## 2015-09-23 NOTE — ED Notes (Signed)
Pt very anxious, having some sob , md in to se pt, will get more information when pt can answer

## 2015-09-23 NOTE — ED Notes (Signed)
Also has wound on left lower leg that is being care for

## 2015-09-23 NOTE — Progress Notes (Signed)
*  PRELIMINARY RESULTS* Echocardiogram 2D Echocardiogram has been performed.  Linda Hart 09/23/2015, 3:17 PM

## 2015-09-23 NOTE — ED Notes (Signed)
Resident of independent living at [facility], c/o cough sob,

## 2015-09-23 NOTE — Consult Note (Signed)
ANTICOAGULATION CONSULT NOTE - Initial Consult  Pharmacy Consult for lovenox Indication: chest pain/ACS NSEMI  Allergies  Allergen Reactions  . Penicillins     Has patient had a PCN reaction causing immediate rash, facial/tongue/throat swelling, SOB or lightheadedness with hypotension: Unknown Has patient had a PCN reaction causing severe rash involving mucus membranes or skin necrosis: Unknown Has patient had a PCN reaction that required hospitalization No Has patient had a PCN reaction occurring within the last 10 years: No If all of the above answers are "NO", then may proceed with Cephalosporin use.    Patient Measurements: Weight: 115 lb (52.164 kg) Heparin Dosing Weight:   Vital Signs: Temp: 98 F (36.7 C) (11/25 1220) Temp Source: Oral (11/25 1220) BP: 118/47 mmHg (11/25 1220) Pulse Rate: 80 (11/25 1220)  Labs:  Recent Labs  09/23/15 0742 09/23/15 1221  HGB 10.9*  --   HCT 34.3*  --   PLT 360  --   CREATININE 1.47*  --   TROPONINI 0.04* 0.26*    Estimated Creatinine Clearance: 19 mL/min (by C-G formula based on Cr of 1.47).   Medical History: Past Medical History  Diagnosis Date  . Dizziness   . Hypotension   . Coronary artery disease 03/06/13    90% prox LAD stenosis s/p DES  . Ischemic cardiomyopathy 03/09/13    s/p NSTEMI. EF 30-35%.  Marland Kitchen GERD (gastroesophageal reflux disease)   . PAF (paroxysmal atrial fibrillation) ([place]) 03/06/13    In setting of NSTEMI and reperfusion, no recurrence  . SVT (supraventricular tachycardia) (HCC)     Self-limited on outpatient cardiac monitor  . MI (myocardial infarction) ([place])     Medications:  Scheduled:  . aspirin  324 mg Oral Once  . aspirin  81 mg Oral Daily  . atorvastatin  20 mg Oral q1800  . carvedilol  3.125 mg Oral BID WC  . enoxaparin (LOVENOX) injection  1 mg/kg Subcutaneous Q24H  . [START ON 09/25/2015] levofloxacin (LEVAQUIN) IV  500 mg Intravenous Q48H  . levofloxacin (LEVAQUIN) IV  750 mg  Intravenous Once  . lisinopril  5 mg Oral Daily  . [START ON 09/24/2015] loratadine  10 mg Oral Daily  . sodium chloride  3 mL Intravenous Q12H  . ticagrelor  60 mg Oral BID    Assessment: Pt is a 81 year old female with elevated troponin. Pharmacy consulted to dose lovenox for NSEMI/CP/ACS. Pt crcl is 67ml/min  Goal of Therapy:   Monitor platelets by anticoagulation protocol: Yes   Plan:  Lovenox 1mg /kg q 24hr due to renal function. Pharmacy to continue to monitor. If Crcl falls below 5ml/min will need to switch to heparin.   Virdell Hoiland D Tra Wilemon 09/23/2015,2:36 PM

## 2015-09-24 ENCOUNTER — Inpatient Hospital Stay: Payer: Medicare Other

## 2015-09-24 DIAGNOSIS — J40 Bronchitis, not specified as acute or chronic: Secondary | ICD-10-CM

## 2015-09-24 DIAGNOSIS — I255 Ischemic cardiomyopathy: Secondary | ICD-10-CM

## 2015-09-24 DIAGNOSIS — I480 Paroxysmal atrial fibrillation: Secondary | ICD-10-CM

## 2015-09-24 DIAGNOSIS — E785 Hyperlipidemia, unspecified: Secondary | ICD-10-CM

## 2015-09-24 DIAGNOSIS — I509 Heart failure, unspecified: Secondary | ICD-10-CM

## 2015-09-24 DIAGNOSIS — I2510 Atherosclerotic heart disease of native coronary artery without angina pectoris: Secondary | ICD-10-CM

## 2015-09-24 DIAGNOSIS — I5042 Chronic combined systolic (congestive) and diastolic (congestive) heart failure: Secondary | ICD-10-CM

## 2015-09-24 DIAGNOSIS — R0602 Shortness of breath: Secondary | ICD-10-CM

## 2015-09-24 DIAGNOSIS — J069 Acute upper respiratory infection, unspecified: Secondary | ICD-10-CM

## 2015-09-24 DIAGNOSIS — D649 Anemia, unspecified: Secondary | ICD-10-CM

## 2015-09-24 LAB — CBC
HCT: 28.1 % — ABNORMAL LOW (ref 35.0–47.0)
Hemoglobin: 9 g/dL — ABNORMAL LOW (ref 12.0–16.0)
MCH: 26.8 pg (ref 26.0–34.0)
MCHC: 31.9 g/dL — ABNORMAL LOW (ref 32.0–36.0)
MCV: 83.9 fL (ref 80.0–100.0)
PLATELETS: 277 10*3/uL (ref 150–440)
RBC: 3.35 MIL/uL — ABNORMAL LOW (ref 3.80–5.20)
RDW: 14.1 % (ref 11.5–14.5)
WBC: 14.5 10*3/uL — AB (ref 3.6–11.0)

## 2015-09-24 LAB — THYROID PANEL WITH TSH
Free Thyroxine Index: 2.5 (ref 1.2–4.9)
T3 Uptake Ratio: 35 % (ref 24–39)
T4, Total: 7.2 ug/dL (ref 4.5–12.0)
TSH: 6.52 u[IU]/mL — ABNORMAL HIGH (ref 0.450–4.500)

## 2015-09-24 LAB — BASIC METABOLIC PANEL
Anion gap: 8 (ref 5–15)
BUN: 26 mg/dL — AB (ref 6–20)
CALCIUM: 8 mg/dL — AB (ref 8.9–10.3)
CHLORIDE: 102 mmol/L (ref 101–111)
CO2: 23 mmol/L (ref 22–32)
CREATININE: 1.55 mg/dL — AB (ref 0.44–1.00)
GFR calc Af Amer: 33 mL/min — ABNORMAL LOW (ref >60)
GFR calc non Af Amer: 29 mL/min — ABNORMAL LOW (ref >60)
Glucose, Bld: 93 mg/dL (ref 65–99)
Potassium: 3.9 mmol/L (ref 3.5–5.1)
SODIUM: 133 mmol/L — AB (ref 135–145)

## 2015-09-24 LAB — TROPONIN I: Troponin I: 0.19 ng/mL — ABNORMAL HIGH (ref <0.031)

## 2015-09-24 MED ORDER — LEVOFLOXACIN IN D5W 750 MG/150ML IV SOLN: 750.0000 mg | Freq: Once | INTRAVENOUS | Status: DC

## 2015-09-24 MED ORDER — FUROSEMIDE 10 MG/ML IJ SOLN
20.0000 mg | Freq: Every day | INTRAMUSCULAR | Status: DC
  Administered 2015-09-25: 20 mg via INTRAVENOUS
  Filled 2015-09-24 (×2): qty 2

## 2015-09-24 MED ORDER — LEVOFLOXACIN IN D5W 500 MG/100ML IV SOLN
500.0000 mg | INTRAVENOUS | Status: DC
  Administered 2015-09-24: 500 mg via INTRAVENOUS
  Filled 2015-09-24 (×2): qty 100

## 2015-09-24 MED ORDER — SODIUM CHLORIDE 0.9 % IV SOLN
INTRAVENOUS | Status: DC
  Administered 2015-09-24: 17:00:00 via INTRAVENOUS

## 2015-09-24 MED ORDER — ENOXAPARIN SODIUM 60 MG/0.6ML ~~LOC~~ SOLN
1.0000 mg/kg | SUBCUTANEOUS | Status: DC
  Administered 2015-09-24 – 2015-09-25 (×2): 45 mg via SUBCUTANEOUS
  Filled 2015-09-24 (×2): qty 0.6

## 2015-09-24 NOTE — Progress Notes (Signed)
Pt's daughter asked for SW consult to send pt back to a higher level of care at [facility] temporarily

## 2015-09-24 NOTE — Progress Notes (Signed)
Per Dr Manuella Ghazi start fluids, pt has very low output

## 2015-09-24 NOTE — Progress Notes (Addendum)
[facility] NAME: Linda Hart    MR#:  [id number]  DATE OF BIRTH:  1925-05-28  SUBJECTIVE:  CHIEF COMPLAINT:   Chief Complaint  Patient presents with  . Shortness of Breath  she is not sure why she ended up here, but does report that she woke up extremely short of breath at 5:00 in the morning and called EMS due to her medical bracelet.  She denies any symptoms at this time  REVIEW OF SYSTEMS:  Review of Systems  Constitutional: Negative for fever, weight loss, malaise/fatigue and diaphoresis.  HENT: Negative for ear discharge, ear pain, hearing loss, nosebleeds, sore throat and tinnitus.   Eyes: Negative for blurred vision and pain.  Respiratory: Positive for shortness of breath. Negative for cough, hemoptysis and wheezing.   Cardiovascular: Negative for chest pain, palpitations, orthopnea and leg swelling.  Gastrointestinal: Negative for heartburn, nausea, vomiting, abdominal pain, diarrhea, constipation and blood in stool.  Genitourinary: Negative for dysuria, urgency and frequency.  Musculoskeletal: Negative for myalgias and back pain.  Skin: Negative for itching and rash.  Neurological: Positive for weakness. Negative for dizziness, tingling, tremors, focal weakness, seizures and headaches.  Psychiatric/Behavioral: Negative for depression. The patient is not nervous/anxious.     DRUG ALLERGIES:   Allergies  Allergen Reactions  . Penicillins     Has patient had a PCN reaction causing immediate rash, facial/tongue/throat swelling, SOB or lightheadedness with hypotension: Unknown Has patient had a PCN reaction causing severe rash involving mucus membranes or skin necrosis: Unknown Has patient had a PCN reaction that required hospitalization No Has patient had a PCN reaction occurring within the last 10 years: No If all of the above answers are "NO", then may proceed with Cephalosporin use.   VITALS:  Blood  pressure 144/61, pulse 50, temperature 98.3 F (36.8 C), temperature source Oral, resp. rate 16, height 5\' 3"  (1.6 m), weight 46.403 kg (102 lb 4.8 oz), SpO2 96 %. PHYSICAL EXAMINATION:  Physical Exam  Constitutional: She is oriented to person, place, and time and well-developed, well-nourished, and in no distress.  HENT:  Head: Normocephalic and atraumatic.  Eyes: Conjunctivae and EOM are normal. Pupils are equal, round, and reactive to light.  Neck: Normal range of motion. Neck supple. No tracheal deviation present. No thyromegaly present.  Cardiovascular: Normal rate, regular rhythm and normal heart sounds.   Pulmonary/Chest: Effort normal and breath sounds normal. No respiratory distress. She has no wheezes. She exhibits no tenderness.  Abdominal: Soft. Bowel sounds are normal. She exhibits no distension. There is no tenderness.  Musculoskeletal: Normal range of motion.  Neurological: She is alert and oriented to person, place, and time. No cranial nerve deficit.  Skin: Skin is warm and dry. Lesion and rash noted. There is erythema.  Left lower extremity  Psychiatric: Mood and affect normal.   LABORATORY PANEL:   CBC  Recent Labs Lab 09/24/15 0019  WBC 14.5*  HGB 9.0*  HCT 28.1*  PLT 277   ------------------------------------------------------------------------------------------------------------------ Chemistries   Recent Labs Lab 09/23/15 0742 09/24/15 0019  NA 135 133*  K 4.9 3.9  CL 106 102  CO2 21* 23  GLUCOSE 192* 93  BUN 22* 26*  CREATININE 1.47* 1.55*  CALCIUM 8.6* 8.0*  AST 34  --   ALT 16  --   ALKPHOS 107  --   BILITOT 1.0  --    RADIOLOGY:  No results found. ASSESSMENT AND PLAN:  81 year old female with  a history of coronary artery disease and skin a cardiomyopathy with an ejection fraction of 30-35% presents with acute hypoxic respiratory failure.  1. Acute hypoxic respiratory failure: Chest x-ray is concerning for pulmonary edema with acute on  chronic CHF exacerbation. She is currently requiring oxygen which she does not use at home. sepsis ruled out.  continue Levaquin empirically. blood cultures negative thus far  2. Acute on chronic systolic heart failure: Continue IV Lasix and close monitoring of input and output. Her last echocardiogram was in 2014 when her EF was 30-35%. repeat echocardiogram on this admission showed EF of 45%.pending consult cardiology - Dr. Rockey Situ.  Continue low dose ACE inhibitor - lisinopril 5 mg once daily and Coreg 3.125 mg by mouth twice a day.  Also continue Lipitor 20 mg once daily.  Aspirin  81 mg once daily.  3. Hyperlipidemia: Continue Lipitor  4. Paroxysmal atrial fibrillation: Patient continues to be in sinus rhythm at this time.   5.  Skin wound: full thickness wound on left LE (traumatic - patient fell against low cement wall on 08/30/15) Measurement: 2cm x 3cm x 0.4cm Wound bed: ruddy red, moist Drainage (amount, consistency, odor) scant serous on old dressing Periwound: erythematous with evidence of previous wound healing and hemosiderin staining  Dressing applied by wound care nurse   All the records are reviewed and case discussed with Care Management/Social Workerr. Management plans discussed with the patient, family and they are in agreement.  CODE STATUS: DO NOT RESUSCITATE (patient was listed as full code, but I did have a discussion with patient and she is in agreement to change it to DO NOT RESUSCITATE)  TOTAL TIME TAKING CARE OF THIS PATIENT: 35 minutes.   More than 50% of the time was spent in counseling/coordination of care: YES  POSSIBLE D/C IN 1-2 DAYS, DEPENDING ON CLINICAL CONDITION.  And cardiology input   [facility], Ellon Marasco M.D on 09/24/2015 at 8:13 AM  Between 7am to 6pm - Pager - [phone]  After 6pm go to [email or web address] - password [email or web address] [facility] [place]  [facility]  Office  [phone]  CC:  Primary care physician; Ronette Deter, MD

## 2015-09-24 NOTE — Progress Notes (Signed)
Foley catheter DCd per MD Carlynn Spry, no order found for this urinary catheter.  DCd at 1140, 175 cc out

## 2015-09-24 NOTE — Progress Notes (Signed)
PHARMACIST - PHYSICIAN ORDER COMMUNICATION  Per [place] policy antibiotics should be automatically renally dosed adjusted by pharmacy.  Based on patients CrCl <21ml/min patient should receive levofloxacin 500mg   every 48 hours after 750mg  bolus dose for CAP. Recommended duration of treatment is 5 days.   Thank You,   Nancy Fetter, PharmD Pharmacy Resident  10:44 AM 09/24/2015

## 2015-09-24 NOTE — Progress Notes (Signed)
Initial Nutrition Assessment      INTERVENTION:  Meals and snacks: Cater to pt preferences, monitor intake    NUTRITION DIAGNOSIS:    (None at this time) related to   as evidenced by  .    GOAL:   Patient will meet greater than or equal to 90% of their needs    MONITOR:    (Energy intake, electrolyte and renal profile)  REASON FOR ASSESSMENT:   Diagnosis    ASSESSMENT:      Pt admitted with acute respiratory distress, chest pain Past Medical History  Diagnosis Date  . Dizziness   . Hypotension   . Coronary artery disease 03/06/13    90% prox LAD stenosis s/p DES  . Ischemic cardiomyopathy 03/09/13    s/p NSTEMI. EF 30-35%.  Marland Kitchen GERD (gastroesophageal reflux disease)   . PAF (paroxysmal atrial fibrillation) ([place]) 03/06/13    In setting of NSTEMI and reperfusion, no recurrence  . SVT (supraventricular tachycardia) (HCC)     Self-limited on outpatient cardiac monitor  . MI (myocardial infarction) ([place])      Current Nutrition: ate 1/2 ham sandwich and milkshake from [employer] family brought in  Food/Nutrition-Related History: Pt reports normal intake prior to admission,  "I don't eat much."  Typically has 2 pieces of raisin bread and cup of coffee for breakfast, soup for lunch and meat and veggies for dinner   Scheduled Medications:  . aspirin  324 mg Oral Once  . aspirin  81 mg Oral Daily  . atorvastatin  20 mg Oral q1800  . carvedilol  3.125 mg Oral BID WC  . enoxaparin (LOVENOX) injection  1 mg/kg Subcutaneous Q24H  . furosemide  20 mg Intravenous Daily  . levofloxacin (LEVAQUIN) IV  500 mg Intravenous Q48H  . lisinopril  5 mg Oral Daily  . loratadine  10 mg Oral Daily  . sodium chloride  3 mL Intravenous Q12H  . ticagrelor  60 mg Oral BID       Electrolyte/Renal Profile and Glucose Profile:   Recent Labs Lab 09/19/15 1131 09/23/15 0742 09/24/15 0019  NA 135 135 133*  K 4.3 4.9 3.9  CL 104 106 102  CO2 24 21* 23  BUN 25* 22* 26*   CREATININE 1.37* 1.47* 1.55*  CALCIUM 8.7 8.6* 8.0*  GLUCOSE 99 192* 93   Protein Profile:  Recent Labs Lab 09/19/15 1131 09/23/15 0742  ALBUMIN 3.4* 3.2*    Gastrointestinal Profile: Last BM: 11/26    Weight Change: stable wt prior to admission    Diet Order:  Diet Heart Room service appropriate?: Yes; Fluid consistency:: Thin  Skin:   reviewed      Height:   Ht Readings from Last 1 Encounters:  09/23/15 5\' 3"  (1.6 m)    Weight:   Wt Readings from Last 1 Encounters:  09/23/15 102 lb 4.8 oz (46.403 kg)    Ideal Body Weight:     BMI:  Body mass index is 18.13 kg/(m^2).   EDUCATION NEEDS:   No education needs identified at this time  LOW Care Level Avamarie Crossley B. Zenia Resides, [place], [place] (pager)

## 2015-09-24 NOTE — Progress Notes (Signed)
Per MD Gollan hold lasix, BUN & Creat up a bit and not a lot of fluid overload

## 2015-09-24 NOTE — Consult Note (Signed)
Cardiology Consultation Note  Patient ID: Linda Hart, MRN: [id number], DOB/AGE: [birth year] 79 y.o. Admit date: 09/23/2015   Date of Consult: 09/24/2015 Primary Physician: Ronette Deter, MD Primary Cardiologist: Esmond Plants   Chief Complaint: acute SOB Reason for Consult: SOB, elevated troponin, cardiomyopathy  HPI: 81 y.o. female with h/o [name] is a very pleasant 81 year old woman with PMHx s/f CAD (NSTEMI 03/06/13 s/p DES-LAD), PAF (in setting of NSTEMI and reperfusion), ischemic cardiomyopathy (EF 30-35% on 03/09/13 echo), SVT (self-limited on OP monitor), GERD, remote h/o lightheadedness and syncope who was re-admitted to [facility] with worsening DOE/SOB may 2014. She presents for acute onset shortness of breath starting at 5 AM yesterday .She lives at the [place]  Daughter reports she had Thanksgiving with the patient at her house, patient was more tired, weak, not herself. Some cough. 5 AM that morning had acute onset shortness of breath. Patient does not remember details, just remembers not being able to catch her breath with significant coughing. She denies any productive sputum no lower extremity edema, no abdominal swelling.   She's been taking Lasix periodically, in general does not need much diuretic.  Weight has been slowly trending downwards as she has low by mouth intake.  Over the past 2 weeks, elevated white blood cell count, up to 19 yesterday, Down to 14 today Drop in blood count today down to hematocrit 28  In the emergency room she was acutely short of breath, systolic pressure 99991111  respiratory distress  she received Lasix, antibiotic This morning, she feels better, mild shortness of breath, much improved  Other past medical history She was admitted 03/05/13 with NSTEMI.  cardiac catheterization revealing tubular 90% prox extending to mid LAD stenosis s/p DES, mild atheresclerosis in LCx, RCA.  Post-cath echo showed EF 30-35%, severe anterior, septal  and apical HK, mild TR/MR, mildly elevated EF described as reduced. DAPT- ASA/Brilinta x 12 months recommended.  Cr did bump to 1.5-1.6 suspected to be secondary to contrast induced precluding the addition of ACEi/ARB. Limited repeat echo indicated LVEF 30-35%. She declined LifeVest.   After discharge from the hospital, creatinine was >2, Lasix was held.   Previous event monitor showed frequent supraventricular ectopy, rare PVCs.  She had short runs of SVT, the longest was 11 beats. She had 46 short runs. Uncertain if she was symptomatic during these short runs of SVT  Echocardiogram may 2014 with ejection fraction 30-35%, mild to moderate pulmonary hypertension   Past Medical History  Diagnosis Date  . Dizziness   . Hypotension   . Coronary artery disease 03/06/13    90% prox LAD stenosis s/p DES  . Ischemic cardiomyopathy 03/09/13    s/p NSTEMI. EF 30-35%.  Marland Kitchen GERD (gastroesophageal reflux disease)   . PAF (paroxysmal atrial fibrillation) ([place]) 03/06/13    In setting of NSTEMI and reperfusion, no recurrence  . SVT (supraventricular tachycardia) ([facility])     Self-limited on outpatient cardiac monitor  . MI (myocardial infarction) ([place])       Most Recent Cardiac Studies: Echocardiogram showing mildly depressed ejection fraction 40-45%, anteroseptal wall hypokinesis, normal right heart pressures    Surgical History:  Past Surgical History  Procedure Laterality Date  . Cesarean section    . Coronary angioplasty with stent placement  02/2013    DES-mid LAD, mild LCx, RCA stenoses     Home Meds: Prior to Admission medications   Medication Sig Start Date End Date Taking? Authorizing Provider  furosemide (LASIX) 40 MG tablet Take  40 mg by mouth daily as needed for fluid.   Yes Historical Provider, MD  aspirin 81 MG tablet Take 81 mg by mouth daily.      Historical Provider, MD  atorvastatin (LIPITOR) 20 MG tablet Take 1 tablet (20 mg total) by mouth daily at 6 PM. Patient taking  differently: Take 20 mg by mouth at bedtime.  02/23/15   Jackolyn Confer, MD  carvedilol (COREG) 3.125 MG tablet TAKE ONE TABLET TWICE A DAY WITH MEALS. 05/09/15   Jackolyn Confer, MD  chlorpheniramine-HYDROcodone (TUSSIONEX) 10-8 MG/5ML LQCR Take 5 mLs by mouth every 12 (twelve) hours as needed. Patient not taking: Reported on 09/23/2015 11/08/14   Jackolyn Confer, MD  cyanocobalamin (,VITAMIN B-12,) 1000 MCG/ML injection INJECT 1 ML IM EACH MONTH 09/22/15   Jackolyn Confer, MD  levalbuterol [facility] HFA) 45 MCG/ACT inhaler Inhale 1-2 puffs into the lungs every 4 (four) hours as needed for wheezing.    Historical Provider, MD  loratadine (CLARITIN) 10 MG tablet Take 1 tablet (10 mg total) by mouth daily. 01/04/14   Jackolyn Confer, MD  nitroGLYCERIN (NITROSTAT) 0.4 MG SL tablet Place 0.4 mg under the tongue every 5 (five) minutes as needed for chest pain.    Historical Provider, MD  ticagrelor (BRILINTA) 60 MG TABS tablet Take 1 tablet (60 mg total) by mouth 2 (two) times daily. 01/19/15   Minna Merritts, MD    Inpatient Medications:  . aspirin  324 mg Oral Once  . aspirin  81 mg Oral Daily  . atorvastatin  20 mg Oral q1800  . carvedilol  3.125 mg Oral BID WC  . enoxaparin (LOVENOX) injection  1 mg/kg Subcutaneous Q24H  . furosemide  20 mg Intravenous Daily  . lisinopril  5 mg Oral Daily  . loratadine  10 mg Oral Daily  . sodium chloride  3 mL Intravenous Q12H  . ticagrelor  60 mg Oral BID      Allergies:  Allergies  Allergen Reactions  . Penicillins     Has patient had a PCN reaction causing immediate rash, facial/tongue/throat swelling, SOB or lightheadedness with hypotension: Unknown Has patient had a PCN reaction causing severe rash involving mucus membranes or skin necrosis: Unknown Has patient had a PCN reaction that required hospitalization No Has patient had a PCN reaction occurring within the last 10 years: No If all of the above answers are "NO", then may proceed  with Cephalosporin use.    Social History   Social History  . Marital Status: Widowed    Spouse Name: N/A  . Number of Children: N/A  . Years of Education: N/A   Occupational History  . Not on file.   Social History Main Topics  . Smoking status: Never Smoker   . Smokeless tobacco: Never Used  . Alcohol Use: No     Comment: occasional  . Drug Use: No  . Sexual Activity: Not on file   Other Topics Concern  . 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 Does not regularly exercise.      Family History  Problem Relation Age of Onset  . Heart failure Mother   . Heart failure Father   . Heart attack Father     Review of Systems: ROS All other systems were reviewed and negative.   Labs:  Recent Labs  09/23/15 0742 09/23/15 1221 09/23/15 1806 09/24/15 0019  TROPONINI 0.04* 0.26* 0.26* 0.19*   Lab Results  Component Value Date   WBC 14.5* 09/24/2015   HGB 9.0* 09/24/2015   HCT 28.1* 09/24/2015   MCV 83.9 09/24/2015   PLT 277 09/24/2015    Recent Labs Lab 09/23/15 0742 09/24/15 0019  NA 135 133*  K 4.9 3.9  CL 106 102  CO2 21* 23  BUN 22* 26*  CREATININE 1.47* 1.55*  CALCIUM 8.6* 8.0*  PROT 6.7  --   BILITOT 1.0  --   ALKPHOS 107  --   ALT 16  --   AST 34  --   GLUCOSE 192* 93   Lab Results  Component Value Date   CHOL 120 09/19/2015   HDL 59.60 09/19/2015   LDLCALC 48 09/19/2015   TRIG 60.0 09/19/2015   No results found for: DDIMER  Radiology/Studies:  Dg Chest Port 1 View  09/23/2015  CLINICAL DATA:  Cough.  Shortness of breath. EXAM: PORTABLE CHEST 1 VIEW COMPARISON:  03/11/2013. FINDINGS: Mediastinum and hilar structures are normal. Cardiomegaly pulmonary venous congestion bilateral pulmonary interstitial prominence with Kerley B-lines. Small bilateral effusions. Findings suggest congestive heart failure. No pneumothorax. Thoracic spine degenerative changes scoliosis. IMPRESSION: Findings consistent with congestive  heart failure with mild pulmonary interstitial edema and small bilateral effusions. Electronically Signed   By: Marcello Moores  Register   On: 09/23/2015 07:55    EKG: EKG on today's visit shows normal sinus rhythm old anterior MI  Weights: Filed Weights   09/23/15 0718 09/23/15 1220  Weight: 115 lb (52.164 kg) 102 lb 4.8 oz (46.403 kg)     Physical Exam:NSR with APCs Blood pressure 144/61, pulse 88, temperature 98.3 F (36.8 C), temperature source Oral, resp. rate 16, height 5\' 3"  (1.6 m), weight 102 lb 4.8 oz (46.403 kg), SpO2 96 %. Body mass index is 18.13 kg/(m^2). General: Thin, in no acute distress. Head: Normocephalic, atraumatic, sclera non-icteric, no xanthomas, nares are without discharge.  Neck: Negative for carotid bruits. JVD not elevated. Lungs: Rales b/l Heart: RRR with S1 S2. No murmurs, rubs, or gallops appreciated. Abdomen: Soft, non-tender, non-distended with normoactive bowel sounds. No hepatomegaly. No rebound/guarding. No obvious abdominal masses. Msk:  Strength and tone appear normal for age. Extremities: No clubbing or cyanosis. No edema.  Distal pedal pulses are 2+ and equal bilaterally. Neuro: Alert and oriented X 3. No facial asymmetry. No focal deficit. Moves all extremities spontaneously. Psych:  Responds to questions appropriately with a normal affect.    Assessment and Plan:   81 year old woman with known coronary artery disease, ischemic cardiomyopathy, prior ejection fraction 30-35% in 2014, now 40-45% , presenting with acute onset shortness of breath, severe hypertension , cough , elevated white count, anemia this morning with hematocrit down to 28, elevated cardiac enzymes   1) acute respiratory distress Suspect either viral or rhonchi this in the setting of elevated white count, cough Ejection fraction is mildly depressed, unable to exclude component of acute on chronic systolic and diastolic CHF Other issues contributing to her shortness of breath include  anemia, severe hypertension with systolic of 99991111 on arrival --Symptoms did improve with Lasix, antibiotic, respiratory support with oxygen --BUN and creatinine starting to climb after Lasix, would hold for now and monitor renal function --Currently on antibiotics for possible bronchitis   2) elevated troponin Minimal elevation, already trending downward likely secondary to profound hypoxia and hypertension in the setting of underlying coronary artery disease --No plan for ischemia workup at this time, Could consider outpatient stress test  3) paroxysmal atrial fibrillation Currently in normal  sinus rhythm, will monitor on telemetry Patient's daughter reports wound clinic felt patient had a regular rhythm recently She does have frequent APCs and PVCs, otherwise maintaining normal sinus rhythm  4) history of MI, history of coronary stent Prior stent in 2014 to the proximal LAD Echo showing continued anteroseptal wall hypokinesis  5) cardiomyopathy, ischemic Ejection fraction has improved, up to 40-45% Would continue aspirin, brilinta, low-dose carvedilol Blood pressure has been low in the past, currently not on ACE inhibitor  6) anemia   trending lower today, hematocrit 28,   could consider iron studies Patient reports this was a problem in the past  7) acute on chronic renal insufficiency Likely exacerbated by Lasix, will hold for now     Signed, Esmond Plants, MD [facility] 09/24/2015, 10:16 AM

## 2015-09-24 NOTE — Consult Note (Signed)
ANTICOAGULATION CONSULT NOTE - Initial Consult  Pharmacy Consult for lovenox Indication: chest pain/ACS NSEMI  Allergies  Allergen Reactions  . Penicillins     Has patient had a PCN reaction causing immediate rash, facial/tongue/throat swelling, SOB or lightheadedness with hypotension: Unknown Has patient had a PCN reaction causing severe rash involving mucus membranes or skin necrosis: Unknown Has patient had a PCN reaction that required hospitalization No Has patient had a PCN reaction occurring within the last 10 years: No If all of the above answers are "NO", then may proceed with Cephalosporin use.    Patient Measurements: Height: 5\' 3"  (160 cm) Weight: 102 lb 4.8 oz (46.403 kg) IBW/kg (Calculated) : 52.4   Vital Signs: Temp: 98.3 F (36.8 C) (11/26 0538) BP: 144/61 mmHg (11/26 0538) Pulse Rate: 88 (11/26 0954)  Labs:  Recent Labs  09/23/15 0742 09/23/15 1221 09/23/15 1806 09/24/15 0019  HGB 10.9*  --   --  9.0*  HCT 34.3*  --   --  28.1*  PLT 360  --   --  277  CREATININE 1.47*  --   --  1.55*  TROPONINI 0.04* 0.26* 0.26* 0.19*    Estimated Creatinine Clearance: 18.4 mL/min (by C-G formula based on Cr of 1.55).   Medical History: Past Medical History  Diagnosis Date  . Dizziness   . Hypotension   . Coronary artery disease 03/06/13    90% prox LAD stenosis s/p DES  . Ischemic cardiomyopathy 03/09/13    s/p NSTEMI. EF 30-35%.  Marland Kitchen GERD (gastroesophageal reflux disease)   . PAF (paroxysmal atrial fibrillation) ([place]) 03/06/13    In setting of NSTEMI and reperfusion, no recurrence  . SVT (supraventricular tachycardia) ([facility])     Self-limited on outpatient cardiac monitor  . MI (myocardial infarction) ([place])     Medications:  Scheduled:  . aspirin  324 mg Oral Once  . aspirin  81 mg Oral Daily  . atorvastatin  20 mg Oral q1800  . carvedilol  3.125 mg Oral BID WC  . enoxaparin (LOVENOX) injection  1 mg/kg Subcutaneous Q24H  . furosemide  20 mg Intravenous  Daily  . levofloxacin (LEVAQUIN) IV  500 mg Intravenous Q48H  . lisinopril  5 mg Oral Daily  . loratadine  10 mg Oral Daily  . sodium chloride  3 mL Intravenous Q12H  . ticagrelor  60 mg Oral BID    Assessment: Pt is a 81 year old female with elevated troponin. Pharmacy consulted to dose lovenox for NSEMI/CP/ACS. Pt crcl is 18.79ml/min  Goal of Therapy:   Monitor platelets by anticoagulation protocol: Yes   Plan:  Lovenox 1mg /kg q 24hr due to renal function. Pharmacy to continue to monitor. If CrCl falls below 5ml/min will need to switch to heparin.   Olivia Canter, RPh Clinical Pharmacist 09/24/2015,1:30 PM

## 2015-09-25 ENCOUNTER — Inpatient Hospital Stay: Payer: Medicare Other

## 2015-09-25 DIAGNOSIS — I5043 Acute on chronic combined systolic (congestive) and diastolic (congestive) heart failure: Principal | ICD-10-CM

## 2015-09-25 LAB — CBC
HEMATOCRIT: 28.9 % — AB (ref 35.0–47.0)
HEMOGLOBIN: 9.3 g/dL — AB (ref 12.0–16.0)
MCH: 27.2 pg (ref 26.0–34.0)
MCHC: 32.4 g/dL (ref 32.0–36.0)
MCV: 84 fL (ref 80.0–100.0)
Platelets: 284 10*3/uL (ref 150–440)
RBC: 3.43 MIL/uL — ABNORMAL LOW (ref 3.80–5.20)
RDW: 14.3 % (ref 11.5–14.5)
WBC: 12.9 10*3/uL — AB (ref 3.6–11.0)

## 2015-09-25 LAB — URINE CULTURE

## 2015-09-25 LAB — BASIC METABOLIC PANEL
ANION GAP: 6 (ref 5–15)
BUN: 26 mg/dL — ABNORMAL HIGH (ref 6–20)
CHLORIDE: 105 mmol/L (ref 101–111)
CO2: 22 mmol/L (ref 22–32)
Calcium: 7.8 mg/dL — ABNORMAL LOW (ref 8.9–10.3)
Creatinine, Ser: 1.42 mg/dL — ABNORMAL HIGH (ref 0.44–1.00)
GFR calc non Af Amer: 32 mL/min — ABNORMAL LOW (ref >60)
GFR, EST AFRICAN AMERICAN: 37 mL/min — AB (ref >60)
Glucose, Bld: 97 mg/dL (ref 65–99)
POTASSIUM: 4 mmol/L (ref 3.5–5.1)
Sodium: 133 mmol/L — ABNORMAL LOW (ref 135–145)

## 2015-09-25 LAB — FERRITIN: Ferritin: 26 ng/mL (ref 11–307)

## 2015-09-25 LAB — IRON AND TIBC
Iron: 18 ug/dL — ABNORMAL LOW (ref 28–170)
Saturation Ratios: 8 % — ABNORMAL LOW (ref 10.4–31.8)
TIBC: 235 ug/dL — ABNORMAL LOW (ref 250–450)
UIBC: 217 ug/dL

## 2015-09-25 LAB — FOLATE: FOLATE: 11.6 ng/mL (ref >5.9)

## 2015-09-25 LAB — RETICULOCYTES
RBC.: 3.49 MIL/uL — AB (ref 3.80–5.20)
RETIC COUNT ABSOLUTE: 59.3 10*3/uL (ref 19.0–183.0)
Retic Ct Pct: 1.7 % (ref 0.4–3.1)

## 2015-09-25 LAB — VITAMIN B12: Vitamin B-12: 1171 pg/mL — ABNORMAL HIGH (ref 180–914)

## 2015-09-25 MED ORDER — ALUM & MAG HYDROXIDE-SIMETH 200-200-20 MG/5ML PO SUSP
30.0000 mL | Freq: Four times a day (QID) | ORAL | Status: DC | PRN
  Administered 2015-09-26: 30 mL via ORAL
  Filled 2015-09-25: qty 30

## 2015-09-25 NOTE — Progress Notes (Signed)
CHF vest measurement was 20. MD Rockey Situ was notified.

## 2015-09-25 NOTE — Clinical Social Work Note (Signed)
Clinical Social Work Assessment  Patient Details  Name: Linda Hart MRN: [id number] Date of Birth: Dec 01, 1924  Date of referral:  09/25/15               Reason for consult:   (possible STR)                Permission sought to share information with:  Facility Sport and exercise psychologist, Family Supports Permission granted to share information::  Yes, Verbal Permission Granted  Name::      Corey HaroldBelenda Cruise  HPOA  [phone] cell)  Agency::     Relationship::     Contact Information:     Housing/Transportation Living arrangements for the past 2 months:  [place] of Information:  Patient, Adult Children, Medical Team Patient Interpreter Needed:  None Criminal Activity/Legal Involvement Pertinent to Current Situation/Hospitalization:  No - Comment as needed Significant Relationships:  Adult Children, Community Support Lives with:  Facility Resident Do you feel safe going back to the place where you live?  Yes Need for family participation in patient care:  Yes (Comment)  Care giving concerns:  Daughter and patient concerned with her going back to independent living would like her to go rehab.   Social Worker assessment / plan:  Holiday representative ([place]) consult, patient is from independent living at [place].  Patient and daughter are concerned with patient going back and would like her to [facility] for rehab prior to going to her apartment due to weakness   Patient was alert and oriented and engaged in conversation with CSW.  (Information provided by provided by daughter at bedside).  CSW introduced self and explained role of CSW department   Patient's support system is her three daughters.  Daughter Belenda Cruise was at bedside and is HPOA.  Patient was independent of all ADL prior to coming to [facility].  She used a cane to assist with walking but per daughter and patient this was new.  Patient enjoys participating in the activities at [place] and  enjoys reading.  Her family takes her out to eat and patient socializes with her friends.        Daughter and patient would like CSW to contact [place] about rehab placement.  CSW will complete FL2 in the event patient needs STR at discharge.      Employment status:  Disabled (Comment on whether or not currently receiving Disability) Insurance information:  Medicare PT Recommendations:  Not assessed at this time Information / Referral to community resources:   (none at this time)  Patient/Family's Response to care:  Patient was appreciative of information provided by CSW.  Daughter will contact [facility].   Patient/Family's Understanding of and Emotional Response to Diagnosis, Current Treatment, and Prognosis:  Patient and daughter understands that patient is under continued medical work up at this time.  Once medically stable she will discharge back home with home health or to SNF for rehab.  Emotional Assessment Appearance:  Appears stated age Attitude/Demeanor/Rapport:    Affect (typically observed):  Accepting, Adaptable, Pleasant, Calm Orientation:  Oriented to Self, Oriented to Place, Oriented to  Time, Oriented to Situation Alcohol / Substance use:  Never Used Psych involvement (Current and /or in the community):  No (Comment)  Discharge Needs  Concerns to be addressed:  Care Coordination, Discharge Planning Concerns Readmission within the last 30 days:  No Current discharge risk:  Chronically ill, Lives alone Barriers to Discharge:  Continued Medical Work up   [employer]  H, LCSW 09/25/2015, 12:57 PM

## 2015-09-25 NOTE — Progress Notes (Signed)
MD Manuella Ghazi was notified that pt was having frequent burps that was causing resp distress. Pt daughter also requested to switch rooms "due to not being able to see pt". Daughter viewed 26 and if pt is not going home will change room assignement.

## 2015-09-25 NOTE — Progress Notes (Addendum)
Patient: Linda Hart / Admit Date: 09/23/2015 / Date of Encounter: 09/25/2015, 11:10 AM   Subjective: Daughter reports patient more SOB, burping,  Given low rate IVF yesterday with ABX, Improved creatinine today, Patient on 1 to 2 liters oxygen, feels better   Review of Systems: Review of Systems  Constitutional: Positive for malaise/fatigue.  Respiratory: Positive for cough and shortness of breath.   Cardiovascular: Negative.   Gastrointestinal:       Burping  Musculoskeletal: Negative.   Neurological: Positive for weakness.  Psychiatric/Behavioral: Negative.   All other systems reviewed and are negative.    Objective: Telemetry: NSR Physical Exam: Blood pressure 149/44, pulse 93, temperature 98.2 F (36.8 C), temperature source Oral, resp. rate 16, height 5\' 3"  (1.6 m), weight 102 lb 4.8 oz (46.403 kg), SpO2 94 %. Body mass index is 18.13 kg/(m^2). General: Thin, in no acute distress. Head: Normocephalic, atraumatic, sclera non-icteric, no xanthomas, nares are without discharge. Neck: Negative for carotid bruits. JVD not elevated. Lungs: Scant Rales b/l Heart: RRR with S1 S2. No murmurs, rubs, or gallops appreciated. Abdomen: Soft, non-tender, non-distended with normoactive bowel sounds. No hepatomegaly. No rebound/guarding. No obvious abdominal masses. Msk: Strength and tone appear normal for age. Extremities: No clubbing or cyanosis. No edema. Distal pedal pulses are 2+ and equal bilaterally. Neuro: Alert and oriented X 3. No facial asymmetry. No focal deficit. Moves all extremities spontaneously. Psych: Responds to questions appropriately with a normal affect.   Intake/Output Summary (Last 24 hours) at 09/25/15 1110 Last data filed at 09/25/15 0940  Gross per 24 hour  Intake    480 ml  Output    175 ml  Net    305 ml    Inpatient Medications:  . aspirin  324 mg Oral Once  . aspirin  81 mg Oral Daily  . atorvastatin  20 mg Oral q1800  .  carvedilol  3.125 mg Oral BID WC  . enoxaparin (LOVENOX) injection  1 mg/kg Subcutaneous Q24H  . furosemide  20 mg Intravenous Daily  . levofloxacin (LEVAQUIN) IV  500 mg Intravenous Q48H  . lisinopril  5 mg Oral Daily  . loratadine  10 mg Oral Daily  . sodium chloride  3 mL Intravenous Q12H  . ticagrelor  60 mg Oral BID   Infusions:    Labs:  Recent Labs  09/24/15 0019 09/25/15 0518  NA 133* 133*  K 3.9 4.0  CL 102 105  CO2 23 22  GLUCOSE 93 97  BUN 26* 26*  CREATININE 1.55* 1.42*  CALCIUM 8.0* 7.8*    Recent Labs  09/23/15 0742  AST 34  ALT 16  ALKPHOS 107  BILITOT 1.0  PROT 6.7  ALBUMIN 3.2*    Recent Labs  09/23/15 0742 09/24/15 0019 09/25/15 0518  WBC 19.5* 14.5* 12.9*  NEUTROABS 14.0*  --   --   HGB 10.9* 9.0* 9.3*  HCT 34.3* 28.1* 28.9*  MCV 86.7 83.9 84.0  PLT 360 277 284    Recent Labs  09/23/15 0742 09/23/15 1221 09/23/15 1806 09/24/15 0019  TROPONINI 0.04* 0.26* 0.26* 0.19*   Invalid input(s): POCBNP  Recent Labs  09/23/15 1221  HGBA1C 5.9     Weights: Filed Weights   09/23/15 0718 09/23/15 1220  Weight: 115 lb (52.164 kg) 102 lb 4.8 oz (46.403 kg)     Radiology/Studies:  Dg Chest 2 View  09/25/2015   IMPRESSION: Hyperinflated lung with trace effusions. No infiltrate or pneumothorax. Electronically Signed   By:  Suzy Bouchard M.D.   On: 09/25/2015 09:59   X-ray Chest Pa And Lateral  09/24/2015   IMPRESSION: 1. Small left greater than right bilateral pleural effusions, slightly increased on the left and stable on the right. 2. Slightly increased patchy basilar left lung opacity, either pneumonia, aspiration or atelectasis. Electronically Signed   By: Ilona Sorrel M.D.   On: 09/24/2015 10:21   Dg Chest Port 1 View  09/23/2015  IMPRESSION: Findings consistent with congestive heart failure with mild pulmonary interstitial edema and small bilateral effusions. Electronically Signed   By: Marcello Moores  Register   On: 09/23/2015  07:55     Assessment and Plan  81 year old woman with known coronary artery disease, ischemic cardiomyopathy, prior ejection fraction 30-35% in 2014, now 40-45% , presenting with acute onset shortness of breath, severe hypertension , cough , elevated white count, anemia this morning with hematocrit down to 28, elevated cardiac enzymes   1) acute respiratory distress Suspect either viral or other infection in the setting of elevated white count, cough, malaise Ejection fraction is mildly depressed,  unable to exclude component of acute on chronic systolic and diastolic CHF (CHF score of 20 indicating no significant component of CHF) Also with anemia, severe hypertension with systolic of 99991111 on arrival --BUN and creatinine  climb after Lasix on day 1,  Improved with low rate fluids (SOB worse per daughter but looks comfortable) --Currently on antibiotics for possible bronchitis  CHF vest placement to determine fluid status: score of 20 (indicating SOB not from CHF)  2) elevated troponin Minimal elevation, already trending downward likely secondary to profound hypoxia and hypertension in the setting of underlying coronary artery disease Could consider stress test if no improvement in sx  3) paroxysmal atrial fibrillation Currently in normal sinus rhythm,  on telemetry She does have frequent APCs and PVCs, otherwise maintaining normal sinus rhythm  4) history of MI, history of coronary stent Prior stent in 2014 to the proximal LAD Echo showing continued anteroseptal wall hypokinesis Stress test if no improvement  5) cardiomyopathy, ischemic Ejection fraction has improved, up to 40-45% Would continue aspirin, brilinta, low-dose carvedilol Blood pressure has been low in the past, currently not on ACE inhibitor  6) anemia  trending lower today, hematocrit 28,   iron studies pending Patient reports this was a problem in the past  7) acute on chronic renal insufficiency Likely  exacerbated by Lasix, will hold for now Back at baseline this AM  Dispo: very weak,, will likely need rehab/assisted at [facility]  Signed, Esmond Plants, MD  09/25/2015, 11:10 AM

## 2015-09-25 NOTE — Progress Notes (Signed)
Daughter Juliann Pulse was notified that pt will be moved to 252.

## 2015-09-25 NOTE — Progress Notes (Signed)
NSR. Room air. Takes meds ok. A & o. Up to chair and tolerated it well. Pt educated on using incentive spirometer. Daughter talked to MD Manuella Ghazi and Rockey Situ. No pain. Pt has no further concerns at this time.

## 2015-09-25 NOTE — Progress Notes (Signed)
[facility] [place] NAME: Linda Hart    MR#:  [id number]  DATE OF BIRTH:  [birth year]  SUBJECTIVE:  CHIEF COMPLAINT:   Chief Complaint  Patient presents with  . Shortness of Breath  Nurse notified that pt was having frequent burps that was causing resp distress. Daughter requesting higher level of care (STR/SNF). Patient seems comfortable. Had sob earlier but seems comfortable  REVIEW OF SYSTEMS:  Review of Systems  Constitutional: Negative for fever, weight loss, malaise/fatigue and diaphoresis.  HENT: Negative for ear discharge, ear pain, hearing loss, nosebleeds, sore throat and tinnitus.   Eyes: Negative for blurred vision and pain.  Respiratory: Positive for shortness of breath. Negative for cough, hemoptysis and wheezing.   Cardiovascular: Negative for chest pain, palpitations, orthopnea and leg swelling.  Gastrointestinal: Negative for heartburn, nausea, vomiting, abdominal pain, diarrhea, constipation and blood in stool.  Genitourinary: Negative for dysuria, urgency and frequency.  Musculoskeletal: Negative for myalgias and back pain.  Skin: Negative for itching and rash.  Neurological: Positive for weakness. Negative for dizziness, tingling, tremors, focal weakness, seizures and headaches.  Psychiatric/Behavioral: Negative for depression. The patient is not nervous/anxious.    DRUG ALLERGIES:   Allergies  Allergen Reactions  . Penicillins     Has patient had a PCN reaction causing immediate rash, facial/tongue/throat swelling, SOB or lightheadedness with hypotension: Unknown Has patient had a PCN reaction causing severe rash involving mucus membranes or skin necrosis: Unknown Has patient had a PCN reaction that required hospitalization No Has patient had a PCN reaction occurring within the last 10 years: No If all of the above answers are "NO", then may proceed with Cephalosporin use.   VITALS:  Blood pressure  98/43, pulse 84, temperature 98.2 F (36.8 C), temperature source Oral, resp. rate 19, height 5\' 3"  (1.6 m), weight 46.403 kg (102 lb 4.8 oz), SpO2 100 %. PHYSICAL EXAMINATION:  Physical Exam  Constitutional: She is oriented to person, place, and time and well-developed, well-nourished, and in no distress.  HENT:  Head: Normocephalic and atraumatic.  Eyes: Conjunctivae and EOM are normal. Pupils are equal, round, and reactive to light.  Neck: Normal range of motion. Neck supple. No tracheal deviation present. No thyromegaly present.  Cardiovascular: Normal rate, regular rhythm and normal heart sounds.   Pulmonary/Chest: Effort normal and breath sounds normal. No respiratory distress. She has no wheezes. She exhibits no tenderness.  Abdominal: Soft. Bowel sounds are normal. She exhibits no distension. There is no tenderness.  Musculoskeletal: Normal range of motion.  Neurological: She is alert and oriented to person, place, and time. No cranial nerve deficit.  Skin: Skin is warm and dry. Lesion and rash noted. There is erythema.  Left lower extremity  Psychiatric: Mood and affect normal.   LABORATORY PANEL:   CBC  Recent Labs Lab 09/25/15 0518  WBC 12.9*  HGB 9.3*  HCT 28.9*  PLT 284   ------------------------------------------------------------------------------------------------------------------ Chemistries   Recent Labs Lab 09/23/15 0742  09/25/15 0518  NA 135  < > 133*  K 4.9  < > 4.0  CL 106  < > 105  CO2 21*  < > 22  GLUCOSE 192*  < > 97  BUN 22*  < > 26*  CREATININE 1.47*  < > 1.42*  CALCIUM 8.6*  < > 7.8*  AST 34  --   --   ALT 16  --   --   ALKPHOS 107  --   --  BILITOT 1.0  --   --   < > = values in this interval not displayed. RADIOLOGY:  Dg Chest 2 View  09/25/2015  CLINICAL DATA:  Short of breath EXAM: CHEST  2 VIEW COMPARISON:  Radiograph 09/24/2015 FINDINGS: Normal cardiac silhouette. Lungs are hyperinflated. No effusion, infiltrate, pneumothorax.  Lateral projection there are small effusions over the posterior costophrenic angle. Lungs are hyperinflated IMPRESSION: Hyperinflated lung with trace effusions. No infiltrate or pneumothorax. Electronically Signed   By: Suzy Bouchard M.D.   On: 09/25/2015 09:59   ASSESSMENT AND PLAN:  81 year old female with a history of coronary artery disease and skin a cardiomyopathy with an ejection fraction of 30-35% presents with acute hypoxic respiratory failure.  1. Acute hypoxic respiratory failure: sepsis ruled out.  continue Levaquin empirically. blood cultures negative thus far   2. Acute on chronic systolic heart failure: Continue IV Lasix and close monitoring of input and output. Her last echocardiogram was in 2014 when her EF was 30-35%. repeat echocardiogram on this admission showed EF of 45%.pending consult cardiology - Dr. Rockey Situ.  Continue low dose ACE inhibitor - lisinopril 5 mg once daily and Coreg 3.125 mg by mouth twice a day.  Also continue Lipitor 20 mg once daily.  Aspirin  79 mg once daily. CHF vest : score of 20 (indicating SOB not from CHF)  3. AOCD: has some iron defi. Also. Can consider iron replacement.  4. Paroxysmal atrial fibrillation: Patient continues to be in sinus rhythm at this time.   5. Gas and burping: try maalox prn.   * Hyperlipidemia: Continue Lipitor  * Skin wound: full thickness wound on left LE (traumatic - patient fell against low cement wall on 08/30/15) Measurement: 2cm x 3cm x 0.4cm Wound bed: ruddy red, moist Drainage (amount, consistency, odor) scant serous on old dressing Periwound: erythematous with evidence of previous wound healing and hemosiderin staining  Dressing applied by wound care nurse   All the records are reviewed and case discussed with Care Management/Social Worker. Management plans discussed with the patient, family and they are in agreement.  CODE STATUS: DO NOT RESUSCITATE   TOTAL TIME TAKING CARE OF THIS PATIENT: 35 minutes.    More than 50% of the time was spent in counseling/coordination of care: YES (d/w daughter at bedside)  POSSIBLE D/C IN 1-2 DAYS, DEPENDING ON CLINICAL CONDITION.  And cardiology input   [facility], Araya Roel M.D on 09/25/2015 at 1:52 PM  Between 7am to 6pm - Pager - [phone]  After 6pm go to [email or web address] - password [email or web address] [facility]  [place] [facility]  Office  [phone]  CC:  Primary care physician; Ronette Deter, MD

## 2015-09-25 NOTE — NC FL2 (Signed)
[place] LEVEL OF CARE SCREENING TOOL     IDENTIFICATION  Patient Name: Linda Hart Birthdate: 10/03/1925 Sex: female Admission Date (Current Location): 09/23/2015  [place] and [place] Number:  Set designer)   Facility and Address:  [facility], [address] [place], [place] [address]      Provider Number: [id number]  Attending Physician Name and Address:  Max Sane, MD  Relative Name and Phone Number:       Current Level of Care: Hospital Recommended Level of Care: [place] Prior Approval Number:    Date Approved/Denied:   PASRR Number:  ( [id number] )  Discharge Plan: SNF    Current Diagnoses: Patient Active Problem List   Diagnosis Date Noted  . Systolic and diastolic CHF, acute on chronic ([place]) 09/24/2015  . Acute on chronic congestive heart failure ([place])   . Bronchitis   . CHF (congestive heart failure) ([place]) 09/23/2015  . Hematoma of left lower leg 09/12/2015  . Nosebleed 09/12/2015  . Orthostatic hypotension 07/26/2015  . Viral URI with cough 11/08/2014  . Anemia 01/26/2014  . Dry eyes 01/04/2014  . Shortness of breath 12/23/2013  . Cardiomyopathy, ischemic 03/17/2013  . Hyperlipidemia 03/17/2013  . CAD in native artery 03/17/2013  . PAF (paroxysmal atrial fibrillation) ([place]) 03/17/2013  . Hypertension 02/14/2012  . Pernicious anemia 07/10/2011  . SVT (supraventricular tachycardia) ([place]) 03/21/2011  . Depression 03/21/2011  . PALPITATIONS 08/28/2010    Orientation ACTIVITIES/SOCIAL BLADDER RESPIRATION    Self, Time, Situation, Place    Continent O2 (As needed)  BEHAVIORAL SYMPTOMS/MOOD NEUROLOGICAL BOWEL NUTRITION STATUS      Continent Diet (heart healthy)  PHYSICIAN VISITS COMMUNICATION OF NEEDS Height & Weight Skin    Verbally 5\' 3"  (160 cm) 115 lbs. Skin abrasions, Other (Comment) (ulcer on leg)          AMBULATORY STATUS RESPIRATION    Assist extensive O2 (As needed)       Personal Care Assistance Level of Assistance  Bathing, Feeding, Dressing Bathing Assistance: Maximum assistance Feeding assistance: Independent Dressing Assistance: Maximum assistance      Functional Limitations Info  Sight, Hearing, Speech Sight Info: Adequate Hearing Info: Adequate Speech Info: Adequate       SPECIAL CARE FACTORS FREQUENCY                      Additional Factors Info  Code Status, Allergies Code Status Info:  (DNR) Allergies Info:  (Penicillins)           Current Medications (09/25/2015): Current Facility-Administered Medications  Medication Dose Route Frequency Provider Last Rate Last Dose  . acetaminophen (TYLENOL) tablet 650 mg  650 mg Oral Q6H PRN Bettey Costa, MD       Or  . acetaminophen (TYLENOL) suppository 650 mg  650 mg Rectal Q6H PRN Bettey Costa, MD      . alum & mag hydroxide-simeth (MAALOX/MYLANTA) 200-200-20 MG/5ML suspension 30 mL  30 mL Oral Q6H PRN Max Sane, MD      . aspirin chewable tablet 324 mg  324 mg Oral Once Lisa Roca, MD   Stopped at 09/23/15 1115  . aspirin tablet 81 mg  81 mg Oral Daily Sital Mody, MD   81 mg at 09/25/15 1000  . atorvastatin (LIPITOR) tablet 20 mg  20 mg Oral q1800 Bettey Costa, MD   20 mg at 09/24/15 1700  . bisacodyl (DULCOLAX) EC tablet 5 mg  5 mg Oral Daily PRN Sital  Mody, MD      . carvedilol (COREG) tablet 3.125 mg  3.125 mg Oral BID WC Bettey Costa, MD   3.125 mg at 09/25/15 0847  . enoxaparin (LOVENOX) injection 45 mg  1 mg/kg Subcutaneous Q24H Sital Mody, MD   45 mg at 09/24/15 1527  . furosemide (LASIX) injection 20 mg  20 mg Intravenous Daily Max Sane, MD   20 mg at 09/25/15 0846  . HYDROcodone-acetaminophen (NORCO/VICODIN) 5-325 MG per tablet 1-2 tablet  1-2 tablet Oral Q4H PRN Bettey Costa, MD      . levalbuterol (XOPENEX) nebulizer solution 0.63 mg  0.63 mg Nebulization Q4H PRN Lenis Noon, RPH      . levofloxacin (LEVAQUIN) IVPB 500 mg  500 mg Intravenous Q48H Max Sane, MD   500 mg  at 09/24/15 1527  . lisinopril (PRINIVIL,ZESTRIL) tablet 5 mg  5 mg Oral Daily Bettey Costa, MD   5 mg at 09/25/15 0847  . loratadine (CLARITIN) tablet 10 mg  10 mg Oral Daily Bettey Costa, MD   10 mg at 09/25/15 0846  . nitroGLYCERIN (NITROSTAT) SL tablet 0.4 mg  0.4 mg Sublingual Q5 min PRN Bettey Costa, MD      . ondansetron (ZOFRAN) tablet 4 mg  4 mg Oral Q6H PRN Bettey Costa, MD       Or  . ondansetron (ZOFRAN) injection 4 mg  4 mg Intravenous Q6H PRN Bettey Costa, MD      . senna-docusate (Senokot-S) tablet 1 tablet  1 tablet Oral QHS PRN Sital Mody, MD      . sodium chloride 0.9 % injection 3 mL  3 mL Intravenous Q12H Bettey Costa, MD   3 mL at 09/24/15 0943  . ticagrelor (BRILINTA) tablet 60 mg  60 mg Oral BID Bettey Costa, MD   60 mg at 09/24/15 2225   Do not use this list as official medication orders. Please verify with discharge summary.  DISCHARGE MEDICATIONS:   Current Discharge Medication List    CONTINUE these medications which have NOT CHANGED   Details  furosemide (LASIX) 40 MG tablet Take 40 mg by mouth daily as needed for fluid.    aspirin 81 MG tablet Take 81 mg by mouth daily.     atorvastatin (LIPITOR) 20 MG tablet Take 1 tablet (20 mg total) by mouth daily at 6 PM. Qty: 90 tablet, Refills: 3    carvedilol (COREG) 3.125 MG tablet TAKE ONE TABLET TWICE A DAY WITH MEALS. Qty: 60 tablet, Refills: 6    chlorpheniramine-HYDROcodone (TUSSIONEX) 10-8 MG/5ML LQCR Take 5 mLs by mouth every 12 (twelve) hours as needed. Qty: 140 mL, Refills: 0   Associated Diagnoses: Viral URI with cough    levalbuterol (XOPENEX HFA) 45 MCG/ACT inhaler Inhale 1-2 puffs into the lungs every 4 (four) hours as needed for wheezing.    loratadine (CLARITIN) 10 MG tablet Take 1 tablet (10 mg total) by mouth daily. Qty: 30 tablet, Refills: 11   Associated Diagnoses: Cough    nitroGLYCERIN (NITROSTAT) 0.4 MG SL tablet Place 0.4 mg under the tongue every 5 (five) minutes  as needed for chest pain.    ticagrelor (BRILINTA) 60 MG TABS tablet Take 1 tablet (60 mg total) by mouth 2 (two) times daily. Qty: 60 tablet, Refills: 11    Levaquin 500 mg daily for 5 days         cyanocobalamin (,VITAMIN B-12,) 1000 MCG/ML injection           Relevant Imaging Results:  Relevant Lab Results:  Recent Labs    Additional Information  [id number])  Maurine Cane, LCSW

## 2015-09-26 ENCOUNTER — Telehealth: Payer: Self-pay

## 2015-09-26 ENCOUNTER — Encounter
Admission: RE | Admit: 2015-09-26 | Discharge: 2015-09-26 | Disposition: A | Discharge status: Home / Self Care | Payer: Medicare Other | Source: Ambulatory Visit | Attending: Internal Medicine | Admitting: Internal Medicine

## 2015-09-26 DIAGNOSIS — R142 Eructation: Secondary | ICD-10-CM | POA: Diagnosis not present

## 2015-09-26 DIAGNOSIS — E784 Other hyperlipidemia: Secondary | ICD-10-CM | POA: Diagnosis not present

## 2015-09-26 DIAGNOSIS — I2510 Atherosclerotic heart disease of native coronary artery without angina pectoris: Secondary | ICD-10-CM | POA: Diagnosis not present

## 2015-09-26 DIAGNOSIS — S81812D Laceration without foreign body, left lower leg, subsequent encounter: Secondary | ICD-10-CM | POA: Diagnosis not present

## 2015-09-26 DIAGNOSIS — I471 Supraventricular tachycardia: Secondary | ICD-10-CM | POA: Diagnosis not present

## 2015-09-26 DIAGNOSIS — R531 Weakness: Secondary | ICD-10-CM | POA: Diagnosis not present

## 2015-09-26 DIAGNOSIS — Z7982 Long term (current) use of aspirin: Secondary | ICD-10-CM | POA: Diagnosis not present

## 2015-09-26 DIAGNOSIS — Z7902 Long term (current) use of antithrombotics/antiplatelets: Secondary | ICD-10-CM | POA: Diagnosis not present

## 2015-09-26 DIAGNOSIS — I480 Paroxysmal atrial fibrillation: Secondary | ICD-10-CM | POA: Diagnosis not present

## 2015-09-26 DIAGNOSIS — J40 Bronchitis, not specified as acute or chronic: Secondary | ICD-10-CM | POA: Diagnosis not present

## 2015-09-26 DIAGNOSIS — I509 Heart failure, unspecified: Secondary | ICD-10-CM | POA: Diagnosis not present

## 2015-09-26 DIAGNOSIS — J209 Acute bronchitis, unspecified: Secondary | ICD-10-CM | POA: Diagnosis not present

## 2015-09-26 DIAGNOSIS — R0602 Shortness of breath: Secondary | ICD-10-CM | POA: Diagnosis not present

## 2015-09-26 DIAGNOSIS — R2689 Other abnormalities of gait and mobility: Secondary | ICD-10-CM | POA: Diagnosis not present

## 2015-09-26 DIAGNOSIS — D649 Anemia, unspecified: Secondary | ICD-10-CM | POA: Diagnosis not present

## 2015-09-26 DIAGNOSIS — E785 Hyperlipidemia, unspecified: Secondary | ICD-10-CM | POA: Diagnosis not present

## 2015-09-26 DIAGNOSIS — M6281 Muscle weakness (generalized): Secondary | ICD-10-CM | POA: Diagnosis not present

## 2015-09-26 DIAGNOSIS — K219 Gastro-esophageal reflux disease without esophagitis: Secondary | ICD-10-CM | POA: Diagnosis not present

## 2015-09-26 DIAGNOSIS — I5023 Acute on chronic systolic (congestive) heart failure: Secondary | ICD-10-CM | POA: Diagnosis not present

## 2015-09-26 DIAGNOSIS — J9601 Acute respiratory failure with hypoxia: Secondary | ICD-10-CM | POA: Diagnosis not present

## 2015-09-26 DIAGNOSIS — I5043 Acute on chronic combined systolic (congestive) and diastolic (congestive) heart failure: Secondary | ICD-10-CM | POA: Diagnosis not present

## 2015-09-26 DIAGNOSIS — I255 Ischemic cardiomyopathy: Secondary | ICD-10-CM | POA: Diagnosis not present

## 2015-09-26 LAB — CREATININE, SERUM
CREATININE: 1.4 mg/dL — AB (ref 0.44–1.00)
GFR, EST AFRICAN AMERICAN: 38 mL/min — AB (ref >60)
GFR, EST NON AFRICAN AMERICAN: 32 mL/min — AB (ref >60)

## 2015-09-26 LAB — HEMOGLOBIN: Hemoglobin: 9.8 g/dL — ABNORMAL LOW (ref 12.0–16.0)

## 2015-09-26 MED ORDER — LEVOFLOXACIN 500 MG PO TABS
500.0000 mg | ORAL_TABLET | Freq: Once | ORAL | Status: AC
  Administered 2015-09-26: 500 mg via ORAL
  Filled 2015-09-26: qty 1

## 2015-09-26 MED ORDER — HEPARIN SODIUM (PORCINE) 5000 UNIT/ML IJ SOLN: 5000.0000 [IU] | Freq: Three times a day (TID) | INTRAMUSCULAR | Status: DC

## 2015-09-26 MED ORDER — LEVOFLOXACIN 500 MG PO TABS: 500.0000 mg | ORAL_TABLET | Freq: Every day | ORAL | Status: DC

## 2015-09-26 NOTE — Progress Notes (Signed)
RN aided patient to chair Chair alarm on at this time, call bell, room phone, remote all within reach. Daughter at bedside, all questions answered.

## 2015-09-26 NOTE — Telephone Encounter (Signed)
-----   Message from Blain Pais sent at 09/26/2015  2:31 PM EST ----- Regarding: tcm/ph 10/07/2015 11:20 Dr. Rockey Situ

## 2015-09-26 NOTE — Progress Notes (Signed)
Report called to Asencion Partridge, Therapist, sports at [place]. Awaiting news of when [facility] will arrive, as they are providing transport.

## 2015-09-26 NOTE — Progress Notes (Signed)
MD on the floor, updated on patients BP.  Filed Vitals:   09/26/15 0603 09/26/15 1147  BP: 144/41 85/39  Pulse: 88 79  Temp: 97.9 F (36.6 C) 97.5 F (36.4 C)  Resp: 16 18   Instructed to hold lasix. Pt resting quieting in chair, no complaints at this time. Will continue to monitor.

## 2015-09-26 NOTE — Discharge Summary (Signed)
[facility] [place] NAME: Linda Hart    MR#:  [id number]  DATE OF BIRTH:  [birth year]  DATE OF ADMISSION:  09/23/2015 ADMITTING PHYSICIAN: Bettey Costa, MD  DATE OF DISCHARGE: 09/26/2015    PRIMARY CARE PHYSICIAN: Ronette Deter, MD    ADMISSION DIAGNOSIS:  Sepsis due to undetermined organism with acute respiratory failure ([place]) [A41.9, R65.20, J96.00] Acute on chronic congestive heart failure, unspecified congestive heart failure type ([place]) [I50.9]  DISCHARGE DIAGNOSIS:  Active Problems:   Cardiomyopathy, ischemic   Hyperlipidemia   CAD in native artery   PAF (paroxysmal atrial fibrillation) (HCC)   Shortness of breath   Anemia   Viral URI with cough   CHF (congestive heart failure) (HCC)   Systolic and diastolic CHF, acute on chronic (H[facility])   Acute on chronic congestive heart failure ([place])   Bronchitis   SECONDARY DIAGNOSIS:   Past Medical History  Diagnosis Date  . Dizziness   . Hypotension   . Coronary artery disease 03/06/13    90% prox LAD stenosis s/p DES  . Ischemic cardiomyopathy 03/09/13    s/p NSTEMI. EF 30-35%.  Mar[place] Kitchen GERD (gastroesophageal reflux disease)   . PAF (paroxysmal atrial fibrillation) ([place]) 03/06/13    In setting of NSTEMI and reperfusion, no recurrence  . SVT (supraventricular tachycardia) (HCC)     Self-limited on outpatient cardiac monitor  . MI (myocardial infarction) [facility])     HOSPITAL COURSE:   81 year old female with a history of coronary artery disease and skin a cardiomyopathy with an ejection fraction of 30-35% presents with acute hypoxic respiratory failure.  1. Acute hypoxic respiratory failure: sepsis ruled out. Continue Levaquin for empirically for suspected bronchitis. blood cultures negative thus far.  2. Acute on chronic systolic heart failure: Patient was started on LAsix IV on admission as it appeared that initally she likley had CHF exacerbation. She has improved.. Her  last echocardiogram was in 2014 when her EF was 30-35%. Repeat echocardiogram on this admission showed EF of 45%. Continue PRN lasix and COREG. She does not need and ACEI. Her blood pressure is low normal. Continue Lipitor 20 mg once daily. Aspirin 81 mg once daily. CHF vest: score of 20 after VI lasix given.   3. AOCD as well as iron defi..  4. Paroxysmal atrial fibrillation: Patient continues to be in sinus rhythm at this time.   5. Gas and burping: resolved  6 Hyperlipidemia: Continue Lipitor  7 Skin wound: full thickness wound on left LE (traumatic - patient fell against low cement wall on 08/30/15) Measurement: 2cm x 3cm x 0.4cm Continue wound care as per wound care nurse   DISCHARGE CONDITIONS AND DIET:   heart healthy stable  CONSULTS OBTAINED:  Treatment Team:  Minna Merritts, MD  DRUG ALLERGIES:   Allergies  Allergen Reactions  . Penicillins     Has patient had a PCN reaction causing immediate rash, facial/tongue/throat swelling, SOB or lightheadedness with hypotension: Unknown Has patient had a PCN reaction causing severe rash involving mucus membranes or skin necrosis: Unknown Has patient had a PCN reaction that required hospitalization No Has patient had a PCN reaction occurring within the last 10 years: No If all of the above answers are "NO", then may proceed with Cephalosporin use.    DISCHARGE MEDICATIONS:   Current Discharge Medication List    CONTINUE these medications which have NOT CHANGED   Details  furosemide (LASIX) 40 MG tablet Take 40 mg by mouth  daily as needed for fluid.    aspirin 81 MG tablet Take 81 mg by mouth daily.      atorvastatin (LIPITOR) 20 MG tablet Take 1 tablet (20 mg total) by mouth daily at 6 PM. Qty: 90 tablet, Refills: 3    carvedilol (COREG) 3.125 MG tablet TAKE ONE TABLET TWICE A DAY WITH MEALS. Qty: 60 tablet, Refills: 6    chlorpheniramine-HYDROcodone (TUSSIONEX) 10-8 MG/5ML LQCR Take 5 mLs by mouth every 12  (twelve) hours as needed. Qty: 140 mL, Refills: 0   Associated Diagnoses: Viral URI with cough    levalbuterol (XOPENEX HFA) 45 MCG/ACT inhaler Inhale 1-2 puffs into the lungs every 4 (four) hours as needed for wheezing.    loratadine (CLARITIN) 10 MG tablet Take 1 tablet (10 mg total) by mouth daily. Qty: 30 tablet, Refills: 11   Associated Diagnoses: Cough    nitroGLYCERIN (NITROSTAT) 0.4 MG SL tablet Place 0.4 mg under the tongue every 5 (five) minutes as needed for chest pain.    ticagrelor (BRILINTA) 60 MG TABS tablet Take 1 tablet (60 mg total) by mouth 2 (two) times daily. Qty: 60 tablet, Refills: 11    Levaquin 500 mg daily for 5 days         cyanocobalamin (,VITAMIN B-12,) 1000 MCG/ML injection               Today   CHIEF COMPLAINT:  Patient doing well this am working on puzzles.   VITAL SIGNS:  Blood pressure 101/43, pulse 80, temperature 97.5 F (36.4 C), temperature source Oral, resp. rate 18, height 5\' 3"  (1.6 m), weight 46.403 kg (102 lb 4.8 oz), SpO2 98 %.   REVIEW OF SYSTEMS:  Review of Systems  Constitutional: Negative for fever, chills and malaise/fatigue.  HENT: Negative for sore throat.   Eyes: Negative for blurred vision.  Respiratory: Negative for cough, hemoptysis, shortness of breath and wheezing.   Cardiovascular: Negative for chest pain, palpitations and leg swelling.  Gastrointestinal: Negative for nausea, vomiting, abdominal pain, diarrhea and blood in stool.  Genitourinary: Negative for dysuria.  Musculoskeletal: Negative for back pain.  Skin:       Wound on shin  Neurological: Negative for dizziness, tremors and headaches.  Endo/Heme/Allergies: Does not bruise/bleed easily.     PHYSICAL EXAMINATION:  GENERAL:  81 y.o.-year-old patient lying in the bed with no acute distress.  NECK:  Supple, no jugular venous distention. No thyroid enlargement, no tenderness.  LUNGS: Normal breath sounds bilaterally, no wheezing,  rales,rhonchi  No use of accessory muscles of respiration.  CARDIOVASCULAR: S1, S2 normal. No murmurs, rubs, or gallops.  ABDOMEN: Soft, non-tender, non-distended. Bowel sounds present. No organomegaly or mass.  EXTREMITIES: No pedal edema, cyanosis, or clubbing.  PSYCHIATRIC: The patient is alert and oriented x 3.  SKIN: wound on shin POA   DATA REVIEW:   CBC  Recent Labs Lab 09/25/15 0518 09/26/15 1311  WBC 12.9*  --   HGB 9.3* 9.8*  HCT 28.9*  --   PLT 284  --     Chemistries   Recent Labs Lab 09/23/15 0742  09/25/15 0518 09/26/15 0431  NA 135  < > 133*  --   K 4.9  < > 4.0  --   CL 106  < > 105  --   CO2 21*  < > 22  --   GLUCOSE 192*  < > 97  --   BUN 22*  < > 26*  --   CREATININE  1.47*  < > 1.42* 1.40*  CALCIUM 8.6*  < > 7.8*  --   AST 34  --   --   --   ALT 16  --   --   --   ALKPHOS 107  --   --   --   BILITOT 1.0  --   --   --   < > = values in this interval not displayed.  Cardiac Enzymes  Recent Labs Lab 09/23/15 1221 09/23/15 1806 09/24/15 0019  TROPONINI 0.26* 0.26* 0.19*    Microbiology Results  @MICRORSLT48 @  RADIOLOGY:  Dg Chest 2 View  09/25/2015  CLINICAL DATA:  Short of breath EXAM: CHEST  2 VIEW COMPARISON:  Radiograph 09/24/2015 FINDINGS: Normal cardiac silhouette. Lungs are hyperinflated. No effusion, infiltrate, pneumothorax. Lateral projection there are small effusions over the posterior costophrenic angle. Lungs are hyperinflated IMPRESSION: Hyperinflated lung with trace effusions. No infiltrate or pneumothorax. Electronically Signed   By: Suzy Bouchard M.D.   On: 09/25/2015 09:59      Management plans discussed with the patient and she is in agreement. Stable for discharge SNF  Patient should follow up with PCP in1 week  CODE STATUS:     Code Status Orders        Start     Ordered   09/24/15 0836  Do not attempt resuscitation (DNR)   Continuous    Question Answer Comment  In the event of cardiac or respiratory  ARREST Do not call a "code blue"   In the event of cardiac or respiratory ARREST Do not perform Intubation, CPR, defibrillation or ACLS   In the event of cardiac or respiratory ARREST Use medication by any route, position, wound care, and other measures to relive pain and suffering. May use oxygen, suction and manual treatment of airway obstruction as needed for comfort.      09/24/15 0835      TOTAL TIME TAKING CARE OF THIS PATIENT: 35 minutes.    Note: This dictation was prepared with Dragon dictation along with smaller phrase technology. Any transcriptional errors that result from this process are unintentional.  Eswin Worrell M.D on 09/26/2015 at 2:01 PM  Between 7am to 6pm - Pager - [phone] After 6pm go to [email or web address] - password [email or web address] [facility]  [place] [facility]  Office  [phone]  CC: Primary care physician; Ronette Deter, MD

## 2015-09-26 NOTE — Clinical Social Work Placement (Signed)
   CLINICAL SOCIAL WORK PLACEMENT  NOTE  Date:  09/26/2015  Patient Details  Name: Linda Hart MRN: [id number] Date of Birth: November 26, 1924  Clinical Social Work is seeking post-discharge placement for this patient at the [place] level of care (*CSW will initial, date and re-position this form in  chart as items are completed):  Yes   Patient/family provided with [place] Work Department's list of facilities offering this level of care within the geographic area requested by the patient (or if unable, by the patient's family).  Yes   Patient/family informed of their freedom to choose among providers that offer the needed level of care, that participate in Medicare, Medicaid or managed care program needed by the patient, have an available bed and are willing to accept the patient.  Yes   Patient/family informed of [facility]'s ownership interest in [facility] and [facility], as well as of the fact that they are under no obligation to receive care at these facilities.  PASRR submitted to EDS on       PASRR number received on       Existing PASRR number confirmed on 09/25/15     FL2 transmitted to all facilities in geographic area requested by pt/family on 09/25/15     FL2 transmitted to all facilities within larger geographic area on       Patient informed that his/her managed care company has contracts with or will negotiate with certain facilities, including the following:        Yes   Patient/family informed of bed offers received.  Patient chooses bed at [facility]     Physician recommends and patient chooses bed at      Patient to be transferred to [facility] on 09/26/15.  Patient to be transferred to facility by [facility] of [place] transportation     Patient family notified on 09/26/15 of transfer.  Name of family member notified:  Daughter at bedside     PHYSICIAN       Additional Comment:     _______________________________________________ Alonna Buckler, LCSW 09/26/2015, 4:18 PM

## 2015-09-26 NOTE — Telephone Encounter (Signed)
Attempted to contact pt regarding discharge from [facility] on 09/26/15. Left message asking pt to call back w/ any questions or concerns regarding discharge instructions and/or medications.  Advised her of appt w/ Dr. Rockey Situ on 10/07/15 @ 11/20. Asked her to call back if she will be unable to keep this appt.

## 2015-09-26 NOTE — Progress Notes (Signed)
RN asked Dr. Benjie Karvonen about IVPB levaquin that is due at this time. Per MD, give 500mg  PO levaquin instead of IVPB.

## 2015-09-26 NOTE — Progress Notes (Addendum)
Pt. Discharged to [facility] via wc and [place] transport. Daughter at bedside and to follow transport services to facility in her own car. Report has been called to RN already. Patient assessment unchanged from this morning. TELE and IV discontinued per policy.

## 2015-09-26 NOTE — Evaluation (Signed)
Physical Therapy Evaluation Patient Details Name: Linda Hart MRN: [id number] DOB: 05/18/1925 Today's Date: 09/26/2015   History of Present Illness  presented to ER with progressive SOB; admitted with acute respiratory failure for pulmonary edema and CHF exacerbation.  Also noted with LE wound (present for approx 3 weeks), covered throughout evaluation.  Clinical Impression  Upon evaluation, patient alert and oriented; follows all commands and demonstrates fair insight/safety awareness.  Bilat UE/LE strength and ROM grossly WFL for basic transfers and mobility; marked deficits in cardiopulmonary endurance.  Able to complete sit/stand, basic transfers and short-distance gait (40' x3) with SPC, cga/min assist for safety. Notably SOB and fatigued with minimal exertion, though maintains sats >94-97% on RA throughout session.  Lacks functional endurance required for safe discharge home alone. Would benefit from skilled PT to address above deficits and promote optimal return to PLOF; recommend transition to STR upon discharge from acute hospitalization.     Follow Up Recommendations SNF    Equipment Recommendations       Recommendations for Other Services       Precautions / Restrictions Precautions Precautions: Fall Restrictions Weight Bearing Restrictions: No      Mobility  Bed Mobility               General bed mobility comments: seated in recliner beginning/end of session  Transfers Overall transfer level: Needs assistance Equipment used: Straight cane Transfers: Sit to/from Stand Sit to Stand: Min guard            Ambulation/Gait Ambulation/Gait assistance: Min assist;Min guard Ambulation Distance (Feet): 40 Feet (x3) Assistive device: Straight cane     Gait velocity interpretation: <1.8 ft/sec, indicative of risk for recurrent falls General Gait Details: broad BOS with shorter, somewhat staggering steps at times; intermittently using L UE to reach for  external stabilization.  Minimal use of SPC noted.  Notably SOB with minimal gait distance, but maintains sats >94-97% on RA throughout session.  Stairs            Wheelchair Mobility    Modified Rankin (Stroke Patients Only)       Balance Overall balance assessment: Needs assistance Sitting-balance support: No upper extremity supported;Feet supported Sitting balance-Leahy Scale: Good     Standing balance support: Single extremity supported Standing balance-Leahy Scale: Fair                 High Level Balance Comments: standing functional reach approx 3-4" outside immediate BOS             Pertinent Vitals/Pain Pain Assessment: No/denies pain    Home Living Family/patient expects to be discharged to:: Assisted living               Home Equipment: Kasandra Knudsen - single point      Prior Function Level of Independence: Independent with assistive device(s)         Comments: At true baseline, indep with all mobility without use of assist device; recent use of SPC for ADLs and household mobilization (in previous 3-4 weeks).  Denies fall history.  Does ambulate to/from dining hall at least 1x/day for meals.     Hand Dominance        Extremity/Trunk Assessment   Upper Extremity Assessment: Overall WFL for tasks assessed           Lower Extremity Assessment: Overall WFL for tasks assessed         Communication   Communication: No difficulties  Cognition Arousal/Alertness: Awake/alert Behavior During Therapy:  WFL for tasks assessed/performed Overall Cognitive Status: Within Functional Limits for tasks assessed                      General Comments      Exercises Other Exercises Other Exercises: Toilet transfer x2, ambulatory with SPC, cga/min assist for safety. Sit/stand from standard toilet height, cga; requires UE support to complete.  Standing balacne for clothing management/hand hygiene, cga/close sup      Assessment/Plan     PT Assessment Patient needs continued PT services  PT Diagnosis Difficulty walking;Generalized weakness   PT Problem List Decreased balance;Decreased activity tolerance;Decreased mobility;Decreased safety awareness;Decreased knowledge of precautions;Cardiopulmonary status limiting activity;Decreased skin integrity  PT Treatment Interventions DME instruction;Gait training;Stair training;Functional mobility training;Therapeutic activities;Therapeutic exercise;Balance training;Patient/family education   PT Goals (Current goals can be found in the Care Plan section) Acute Rehab PT Goals Patient Stated Goal: "to go home" PT Goal Formulation: With patient Time For Goal Achievement: 10/10/15 Potential to Achieve Goals: Good    Frequency Min 2X/week   Barriers to discharge Decreased caregiver support      Co-evaluation               End of Session Equipment Utilized During Treatment: Gait belt Activity Tolerance: Patient tolerated treatment well Patient left: in chair;with call bell/phone within reach;with chair alarm set;with family/visitor present Nurse Communication: Mobility status         Time: 1349-1414 PT Time Calculation (min) (ACUTE ONLY): 25 min   Charges:   PT Evaluation $Initial PT Evaluation Tier I: 1 Procedure PT Treatments $Therapeutic Activity: 8-22 mins   PT G Codes:        Daion Ginsberg H. Owens Shark, PT, DPT, NCS 09/26/2015, 3:18 PM [phone]

## 2015-09-26 NOTE — Care Management Important Message (Signed)
Important Message  Patient Details  Name: Linda Hart MRN: [id number] Date of Birth: 1925-01-12   Medicare Important Message Given:  Yes    Juliann Pulse A Kasten Leveque 09/26/2015, 10:13 AM

## 2015-09-26 NOTE — Progress Notes (Signed)
CSW was notified by MD that Pt has been medically cleared for dc to SNF. Pt's is from the [facility] and has selected [facility] for [employer]. CSW confirmed SNF bed at [facility].  RN to call report. [facility] available to provide transportation. Daughter is not comfortable doing transportation.   Toma Copier, [place]

## 2015-09-26 NOTE — Progress Notes (Signed)
Dressing change completed to L leg as ordered. dsg signed, timed and dated.

## 2015-09-26 NOTE — Care Management (Signed)
Patient admitted from [facility] with CHF.  Patient to discharge today to [facility].  CSW following

## 2015-09-28 ENCOUNTER — Ambulatory Visit (INDEPENDENT_AMBULATORY_CARE_PROVIDER_SITE_OTHER): Payer: Medicare Other | Admitting: Internal Medicine

## 2015-09-28 ENCOUNTER — Encounter: Payer: Self-pay | Admitting: Internal Medicine

## 2015-09-28 ENCOUNTER — Ambulatory Visit: Payer: Medicare Other | Admitting: Surgery

## 2015-09-28 VITALS — BP 126/66 | HR 78 | Temp 97.8°F | Wt 103.2 lb

## 2015-09-28 DIAGNOSIS — R142 Eructation: Secondary | ICD-10-CM | POA: Insufficient documentation

## 2015-09-28 DIAGNOSIS — I5043 Acute on chronic combined systolic (congestive) and diastolic (congestive) heart failure: Secondary | ICD-10-CM | POA: Diagnosis not present

## 2015-09-28 DIAGNOSIS — R531 Weakness: Secondary | ICD-10-CM | POA: Diagnosis not present

## 2015-09-28 DIAGNOSIS — D649 Anemia, unspecified: Secondary | ICD-10-CM | POA: Diagnosis not present

## 2015-09-28 DIAGNOSIS — J40 Bronchitis, not specified as acute or chronic: Secondary | ICD-10-CM | POA: Diagnosis not present

## 2015-09-28 DIAGNOSIS — I509 Heart failure, unspecified: Secondary | ICD-10-CM | POA: Diagnosis not present

## 2015-09-28 LAB — CULTURE, BLOOD (ROUTINE X 2)
CULTURE: NO GROWTH
CULTURE: NO GROWTH
Culture: NO GROWTH
Culture: NO GROWTH

## 2015-09-28 LAB — CBC WITH DIFFERENTIAL/PLATELET
BASOS ABS: 0 10*3/uL (ref 0.0–0.1)
Basophils Relative: 0.3 % (ref 0.0–3.0)
EOS ABS: 0.5 10*3/uL (ref 0.0–0.7)
Eosinophils Relative: 4.7 % (ref 0.0–5.0)
HEMATOCRIT: 28.9 % — AB (ref 36.0–46.0)
HEMOGLOBIN: 9.2 g/dL — AB (ref 12.0–15.0)
LYMPHS PCT: 11.4 % — AB (ref 12.0–46.0)
Lymphs Abs: 1.2 10*3/uL (ref 0.7–4.0)
MCHC: 31.9 g/dL (ref 30.0–36.0)
MCV: 84.5 fl (ref 78.0–100.0)
MONO ABS: 0.9 10*3/uL (ref 0.1–1.0)
Monocytes Relative: 8.5 % (ref 3.0–12.0)
Neutro Abs: 7.9 10*3/uL — ABNORMAL HIGH (ref 1.4–7.7)
Neutrophils Relative %: 75.1 % (ref 43.0–77.0)
PLATELETS: 340 10*3/uL (ref 150.0–400.0)
RBC: 3.43 Mil/uL — ABNORMAL LOW (ref 3.87–5.11)
RDW: 14.8 % (ref 11.5–15.5)
WBC: 10.5 10*3/uL (ref 4.0–10.5)

## 2015-09-28 LAB — COMPREHENSIVE METABOLIC PANEL
ALBUMIN: 2.9 g/dL — AB (ref 3.5–5.2)
ALT: 19 U/L (ref 0–35)
AST: 33 U/L (ref 0–37)
Alkaline Phosphatase: 70 U/L (ref 39–117)
BILIRUBIN TOTAL: 0.7 mg/dL (ref 0.2–1.2)
BUN: 32 mg/dL — AB (ref 6–23)
CALCIUM: 8.8 mg/dL (ref 8.4–10.5)
CO2: 22 meq/L (ref 19–32)
CREATININE: 1.5 mg/dL — AB (ref 0.40–1.20)
Chloride: 104 mEq/L (ref 96–112)
GFR: 34.83 mL/min — ABNORMAL LOW (ref >60.00)
Glucose, Bld: 117 mg/dL — ABNORMAL HIGH (ref 70–99)
Potassium: 5.1 mEq/L (ref 3.5–5.1)
SODIUM: 134 meq/L — AB (ref 135–145)
Total Protein: 5.8 g/dL — ABNORMAL LOW (ref 6.0–8.3)

## 2015-09-28 NOTE — Patient Instructions (Addendum)
Stop Levaquin.  Please email with update later this week or early next week.  If no improvement in belching, we will set up evaluation with GI.

## 2015-09-28 NOTE — Assessment & Plan Note (Signed)
Recent CHF exacerbation likely related to viral infection. Appears euvolemic on exam today. Reviewed recent ECHO which showed improved EF compared to previous. Continue current medication.

## 2015-09-28 NOTE — Progress Notes (Signed)
Subjective:    Patient ID: Linda Hart, female    DOB: 20-Dec-1924, 81 y.o.   MRN: [id number]  HPI  81YO female presents for hospital follow up.  ADMISSION:09/23/2015 DISCHARGE:09/26/2015  DIAGNOSIS: Sepsis with acute respiratory failure, Acute on chronic congestive heart failure.  Initially presented to the ED with shortness of breath 11/25. Found to be hypervolemic on exam. Also had elevated WBC and lactate. Concern for sepsis. Covered initially with Vanc and [place]. Blood cultures negative. Transitioned to Levaquin.  Daughter notes that pt has been belching frequently since discharge. They attribute this to use of antibiotic. Taking Maalox with no improvement. Oral intake limited because of belching. No abdominal pain. No diarrhea.  Started working with PT. Energy level improving. No fever, chills. No cough.  ECHO on admission showed EF 40-45% which was improved compared to previous.   Wt Readings from Last 3 Encounters:  09/28/15 103 lb 4 oz (46.834 kg)  09/23/15 102 lb 4.8 oz (46.403 kg)  07/26/15 103 lb 4 oz (46.834 kg)   BP Readings from Last 3 Encounters:  09/28/15 126/66  09/26/15 101/43  09/12/15 132/64    Past Medical History  Diagnosis Date  . Dizziness   . Hypotension   . Coronary artery disease 03/06/13    90% prox LAD stenosis s/p DES  . Ischemic cardiomyopathy 03/09/13    s/p NSTEMI. EF 30-35%.  Marland Kitchen GERD (gastroesophageal reflux disease)   . PAF (paroxysmal atrial fibrillation) ([place]) 03/06/13    In setting of NSTEMI and reperfusion, no recurrence  . SVT (supraventricular tachycardia) (HCC)     Self-limited on outpatient cardiac monitor  . MI (myocardial infarction) ([place])    Family History  Problem Relation Age of Onset  . Heart failure Mother   . Heart failure Father   . Heart attack Father    Past Surgical History  Procedure Laterality Date  . Cesarean section    . Coronary angioplasty with stent placement  02/2013    DES-mid LAD, mild LCx, RCA  stenoses   Social History   Social History  . Marital Status: Widowed    Spouse Name: N/A  . Number of Children: N/A  . Years of Education: N/A   Social History Main Topics  . Smoking status: Never Smoker   . Smokeless tobacco: Never Used  . Alcohol Use: No     Comment: occasional  . Drug Use: No  . Sexual Activity: Not Asked   Other Topics Concern  . 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 Does not regularly exercise.     Review of Systems  Constitutional: Negative for fever, chills, appetite change, fatigue and unexpected weight change.  Eyes: Negative for visual disturbance.  Respiratory: Negative for cough, chest tightness, shortness of breath and wheezing.   Cardiovascular: Negative for chest pain and leg swelling.  Gastrointestinal: Negative for nausea, vomiting, abdominal pain, diarrhea, constipation and abdominal distention.  Genitourinary: Negative for dysuria.  Musculoskeletal: Negative for myalgias and arthralgias.  Skin: Positive for wound (chronic left lower ext). Negative for color change and rash.  Hematological: Negative for adenopathy. Does not bruise/bleed easily.  Psychiatric/Behavioral: Negative for sleep disturbance and dysphoric mood. The patient is not nervous/anxious.        Objective:    BP 126/66 mmHg  Pulse 78  Temp(Src) 97.8 F (36.6 C) (Oral)  Wt 103 lb 4 oz (46.834 kg)  SpO2 99% Physical Exam  Constitutional: She is oriented to person, place, and time.  She appears well-developed and well-nourished. No distress.  Sitting in wheelchair, frequent belching   HENT:  Head: Normocephalic and atraumatic.  Right Ear: External ear normal.  Left Ear: External ear normal.  Nose: Nose normal.  Mouth/Throat: Oropharynx is clear and moist. No oropharyngeal exudate.  Eyes: Conjunctivae are normal. Pupils are equal, round, and reactive to light. Right eye exhibits no discharge. Left eye exhibits no discharge. No scleral icterus.    Neck: Normal range of motion. Neck supple. No tracheal deviation present. No thyromegaly present.  Cardiovascular: Normal rate, regular rhythm, normal heart sounds and intact distal pulses.  Exam reveals no gallop and no friction rub.   No murmur heard. Pulmonary/Chest: Effort normal and breath sounds normal. No accessory muscle usage. No tachypnea. No respiratory distress. She has no decreased breath sounds. She has no wheezes. She has no rhonchi. She has no rales. She exhibits no tenderness.  Musculoskeletal: Normal range of motion. She exhibits edema (left lower leg edema to mid calf, leg wrapped at site of wound). She exhibits no tenderness.  Lymphadenopathy:    She has no cervical adenopathy.  Neurological: She is alert and oriented to person, place, and time. No cranial nerve deficit. She exhibits normal muscle tone. Coordination normal.  Skin: Skin is warm and dry. No rash noted. She is not diaphoretic. No erythema. No pallor.  Psychiatric: She has a normal mood and affect. Her behavior is normal. Judgment and thought content normal.          Assessment & Plan:   Problem List Items Addressed This Visit      Unprioritized   Anemia    Anemia noted during recent hospitalization. Will repeat CBC today.  Likely IDA related to poor po intake.      Relevant Orders   CBC with Differential/Platelet   Belching    Recent increased belching. Possibly related to antibiotic use. Given blood cultures normal, CXR normal, will stop Levaquin. Repeat CBC and CMP today. Start probiotic. If no improvement in belching, then will set up GI evaluation. We also discussed adding Mylanta, however this made symptoms worse in hospital.      Systolic and diastolic CHF, acute on chronic ([place]) - Primary    Recent CHF exacerbation likely related to viral infection. Appears euvolemic on exam today. Reviewed recent ECHO which showed improved EF compared to previous. Continue current medication.       Relevant Orders   Comprehensive metabolic panel       Return in about 4 weeks (around 10/26/2015) for Recheck.

## 2015-09-28 NOTE — Assessment & Plan Note (Signed)
Recent increased belching. Possibly related to antibiotic use. Given blood cultures normal, CXR normal, will stop Levaquin. Repeat CBC and CMP today. Start probiotic. If no improvement in belching, then will set up GI evaluation. We also discussed adding Mylanta, however this made symptoms worse in hospital.

## 2015-09-28 NOTE — Progress Notes (Signed)
Pre visit review using our clinic review tool, if applicable. No additional management support is needed unless otherwise documented below in the visit note. 

## 2015-09-28 NOTE — Assessment & Plan Note (Signed)
Anemia noted during recent hospitalization. Will repeat CBC today.  Likely IDA related to poor po intake.

## 2015-09-29 ENCOUNTER — Encounter: Payer: Self-pay | Admitting: *Deleted

## 2015-09-29 ENCOUNTER — Encounter
Admission: RE | Admit: 2015-09-29 | Discharge: 2015-09-29 | Disposition: A | Discharge status: Home / Self Care | Payer: Medicare Other | Source: Ambulatory Visit | Attending: Internal Medicine | Admitting: Internal Medicine

## 2015-09-29 NOTE — Addendum Note (Signed)
Addended by: Ronette Deter A on: 09/29/2015 11:35 AM   Modules accepted: Level of Service

## 2015-10-07 ENCOUNTER — Encounter: Payer: Self-pay | Admitting: Cardiovascular Disease

## 2015-10-07 ENCOUNTER — Ambulatory Visit (INDEPENDENT_AMBULATORY_CARE_PROVIDER_SITE_OTHER): Payer: Medicare Other | Admitting: Cardiovascular Disease

## 2015-10-07 ENCOUNTER — Telehealth: Payer: Self-pay | Admitting: Cardiovascular Disease

## 2015-10-07 VITALS — BP 104/60 | HR 86 | Ht 60.0 in | Wt 101.0 lb

## 2015-10-07 DIAGNOSIS — I5043 Acute on chronic combined systolic (congestive) and diastolic (congestive) heart failure: Secondary | ICD-10-CM

## 2015-10-07 DIAGNOSIS — I480 Paroxysmal atrial fibrillation: Secondary | ICD-10-CM | POA: Diagnosis not present

## 2015-10-07 DIAGNOSIS — I471 Supraventricular tachycardia: Secondary | ICD-10-CM | POA: Diagnosis not present

## 2015-10-07 DIAGNOSIS — R0602 Shortness of breath: Secondary | ICD-10-CM

## 2015-10-07 DIAGNOSIS — I10 Essential (primary) hypertension: Secondary | ICD-10-CM

## 2015-10-07 DIAGNOSIS — R142 Eructation: Secondary | ICD-10-CM

## 2015-10-07 DIAGNOSIS — I255 Ischemic cardiomyopathy: Secondary | ICD-10-CM

## 2015-10-07 DIAGNOSIS — D649 Anemia, unspecified: Secondary | ICD-10-CM

## 2015-10-07 NOTE — Telephone Encounter (Signed)
Spoke w/ Linda Hart.  Clarified that AVS instructions were not an order, but a recommendation to pt to continue w/ her OT & PT. He verbalizes understanding and will call back w/ any further questions or concerns.

## 2015-10-07 NOTE — Assessment & Plan Note (Signed)
Ejection fraction improving on recent echocardiogram 40-45%, up from 30-35% in the past. Does not take Lasix on a regular basis, no significant leg edema, appears euvolemic No changes to her medications

## 2015-10-07 NOTE — Patient Instructions (Addendum)
You are doing well. No medication changes were made.  Regular diet PT and OT daily Consider GAS-x for belching  Please call us if you have new issues that need to be addressed before your next appt.  Your physician wants you to follow-up in: 6 months.  You will receive a reminder letter in the mail two months in advance. If you don't receive a letter, please call our office to schedule the follow-up appointment.

## 2015-10-07 NOTE — Assessment & Plan Note (Signed)
Hematocrit 29, currently on iron pill twice per day Suggested they closely monitor for constipation

## 2015-10-07 NOTE — Assessment & Plan Note (Signed)
Labile blood pressures though relatively stable at this time. No changes made

## 2015-10-07 NOTE — Assessment & Plan Note (Signed)
No episodes of atrial fibrillation. not on anticoagulation

## 2015-10-07 NOTE — Assessment & Plan Note (Signed)
Daughter reports significant shortness of breath at rest, usually big expiratory breath. She denies having any problem. Sometimes reports that she breathes heavy could she is tired No signs of CHF

## 2015-10-07 NOTE — Assessment & Plan Note (Signed)
Significant belching, uncertain if she has gastroparesis or other GI etiology.. Denies constipation Suggested they try Gas-X

## 2015-10-07 NOTE — Telephone Encounter (Signed)
Wants to clarify orders for PT daily from today's avs .  Patient has been seen 5 days a week for pt and ot .Scott wants to clarify if this new order  is meant for 7 days a week or if current pt/ot at 5 days a week is sufficient.

## 2015-10-07 NOTE — Assessment & Plan Note (Signed)
No symptoms concerning for SVT, no changes to medications

## 2015-10-07 NOTE — Progress Notes (Signed)
Patient ID: Linda Hart, female    DOB: 07-18-1925, 81 y.o.   MRN: [id number]  HPI Comments: [name] is a very pleasant 81 year old woman with  PMHx s/f CAD (NSTEMI 03/06/13 s/p DES-LAD), PAF (in setting of NSTEMI and reperfusion), ischemic cardiomyopathy (EF 30-35% on 03/09/13 echo), SVT (self-limited on OP monitor), GERD, remote h/o lightheadedness and syncope who was re-admitted to [facility] with worsening DOE/SOB may 2014. She presents for routine followup from skilled nursing facility.  She lives at the [facility] She presents today for follow-up of her coronary artery disease  Recent admission to the hospital for sepsis, upper respiratory infection,Pneumonia, treated with Levaquin In follow-up, she is still weak, reports only doing PT 3 times per week Blood pressure labile but stable, denies any palpitations concerning for arrhythmia Anorexia, continues to lose weight compared to prior clinic visits, does not like the food. Significant belching episodes, several episodes while she was in the room, etiology unclear. This has been a long-standing issue Started on iron pill 2 per day yesterday for anemia, hematocrit 29  EKG on today's visit shows normal sinus rhythm with rate 88 bpm, APCs, PVCs  Other past medical history She was admitted 03/05/13 with NSTEMI.  cardiac catheterization revealing tubular 90% prox extending to mid LAD stenosis s/p DES, mild atheresclerosis in LCx, RCA.  Post-cath echo showed EF 30-35%, severe anterior, septal and apical HK, mild TR/MR, mildly elevated EF described as reduced. DAPT- ASA/Brilinta x 12 months recommended.  Cr did bump to 1.5-1.6 suspected to be secondary to contrast induced precluding the addition of ACEi/ARB.  Limited repeat echo indicated LVEF 30-35%. She declined LifeVest.   After discharge from the hospital, creatinine was >2,  Lasix was held.  Creatinine in June 2014 improved to 1.5.  Previous event monitor showed frequent  supraventricular ectopy, rare PVCs.   She had short runs of SVT, the longest was 11 beats. She had 46 short runs. Uncertain if she was symptomatic during these short runs of SVT  Echocardiogram may 2014 with ejection fraction 30-35%, mild to moderate pulmonary hypertension    Allergies  Allergen Reactions  . Penicillins     Has patient had a PCN reaction causing immediate rash, facial/tongue/throat swelling, SOB or lightheadedness with hypotension: Unknown Has patient had a PCN reaction causing severe rash involving mucus membranes or skin necrosis: Unknown Has patient had a PCN reaction that required hospitalization No Has patient had a PCN reaction occurring within the last 10 years: No If all of the above answers are "NO", then may proceed with Cephalosporin use.    Outpatient Encounter Prescriptions as of 10/07/2015  Medication Sig  . aspirin 81 MG tablet Take 81 mg by mouth daily.    Marland Kitchen atorvastatin (LIPITOR) 20 MG tablet Take 1 tablet (20 mg total) by mouth daily at 6 PM. (Patient taking differently: Take 20 mg by mouth at bedtime. )  . carvedilol (COREG) 3.125 MG tablet TAKE ONE TABLET TWICE A DAY WITH MEALS.  . ferrous sulfate (KP FERROUS SULFATE) 325 (65 FE) MG tablet Take 325 mg by mouth 2 (two) times daily with a meal.  . furosemide (LASIX) 40 MG tablet Take 40 mg by mouth daily as needed for fluid.  Marland Kitchen Hydrocodone-Chlorpheniramine 5-4 MG/5ML SOLN Take by mouth as needed.  . levalbuterol (XOPENEX HFA) 45 MCG/ACT inhaler Inhale 1-2 puffs into the lungs every 4 (four) hours as needed for wheezing.  Marland Kitchen loratadine (CLARITIN) 10 MG tablet Take 1 tablet (10 mg total)  by mouth daily.  . nitroGLYCERIN (NITROSTAT) 0.4 MG SL tablet Place 0.4 mg under the tongue every 5 (five) minutes as needed for chest pain.  . ticagrelor (BRILINTA) 60 MG TABS tablet Take 1 tablet (60 mg total) by mouth 2 (two) times daily.  . vitamin C (ASCORBIC ACID) 500 MG tablet Take 500 mg by mouth 2 (two) times daily.    No facility-administered encounter medications on file as of 10/07/2015.    Past Medical History  Diagnosis Date  . Dizziness   . Hypotension   . Coronary artery disease 03/06/13    90% prox LAD stenosis s/p DES  . Ischemic cardiomyopathy 03/09/13    s/p NSTEMI. EF 30-35%.  Marland Kitchen GERD (gastroesophageal reflux disease)   . PAF (paroxysmal atrial fibrillation) ([place]) 03/06/13    In setting of NSTEMI and reperfusion, no recurrence  . SVT (supraventricular tachycardia) (HCC)     Self-limited on outpatient cardiac monitor  . MI (myocardial infarction) [facility])     Past Surgical History  Procedure Laterality Date  . Cesarean section    . Coronary angioplasty with stent placement  02/2013    DES-mid LAD, mild LCx, RCA stenoses    Social History  reports that she has never smoked. She has never used smokeless tobacco. She reports that she does not drink alcohol or use illicit drugs.  Family History family history includes Heart attack in her father; Heart failure in her father and mother.   Review of Systems  Constitutional: Positive for fatigue.  Respiratory: Negative.   Cardiovascular: Negative.   Gastrointestinal: Negative.        Significant belching, anorexia  Musculoskeletal: Positive for gait problem.  Skin: Negative.   Neurological: Positive for weakness.  Hematological: Negative.   Psychiatric/Behavioral: Negative.   All other systems reviewed and are negative.  BP 104/60 mmHg  Pulse 86  Ht 5' (1.524 m)  Wt 101 lb (45.813 kg)  BMI 19.73 kg/m2  Physical Exam  Constitutional: She is oriented to person, place, and time. She appears well-developed.  Thin  HENT:  Head: Normocephalic.  Nose: Nose normal.  Mouth/Throat: Oropharynx is clear and moist.  Eyes: Conjunctivae are normal. Pupils are equal, round, and reactive to light.  Neck: Normal range of motion. Neck supple. No JVD present.  Cardiovascular: Normal rate, regular rhythm, S1 normal, S2 normal, normal heart  sounds and intact distal pulses.  Exam reveals no gallop and no friction rub.   No murmur heard. Pulmonary/Chest: Effort normal and breath sounds normal. No respiratory distress. She has no wheezes. She has no rales. She exhibits no tenderness.  Abdominal: Soft. Bowel sounds are normal. She exhibits no distension. There is no tenderness.  Musculoskeletal: Normal range of motion. She exhibits no edema or tenderness.  Lymphadenopathy:    She has no cervical adenopathy.  Neurological: She is alert and oriented to person, place, and time. Coordination normal.  Skin: Skin is warm and dry. No rash noted. No erythema.  Psychiatric: She has a normal mood and affect. Her behavior is normal. Judgment and thought content normal.    Assessment and Plan  Nursing note and vitals reviewed.

## 2015-10-20 DIAGNOSIS — E785 Hyperlipidemia, unspecified: Secondary | ICD-10-CM | POA: Diagnosis not present

## 2015-10-20 DIAGNOSIS — I2510 Atherosclerotic heart disease of native coronary artery without angina pectoris: Secondary | ICD-10-CM | POA: Diagnosis not present

## 2015-10-20 DIAGNOSIS — G20 Parkinson's disease: Secondary | ICD-10-CM | POA: Diagnosis not present

## 2015-10-20 DIAGNOSIS — I255 Ischemic cardiomyopathy: Secondary | ICD-10-CM | POA: Diagnosis not present

## 2015-10-20 DIAGNOSIS — I5042 Chronic combined systolic (congestive) and diastolic (congestive) heart failure: Secondary | ICD-10-CM | POA: Diagnosis not present

## 2015-10-20 DIAGNOSIS — D649 Anemia, unspecified: Secondary | ICD-10-CM | POA: Diagnosis not present

## 2015-10-20 DIAGNOSIS — Z9181 History of falling: Secondary | ICD-10-CM | POA: Diagnosis not present

## 2015-10-20 DIAGNOSIS — I480 Paroxysmal atrial fibrillation: Secondary | ICD-10-CM | POA: Diagnosis not present

## 2015-10-20 DIAGNOSIS — I471 Supraventricular tachycardia: Secondary | ICD-10-CM | POA: Diagnosis not present

## 2015-10-20 DIAGNOSIS — Z7982 Long term (current) use of aspirin: Secondary | ICD-10-CM | POA: Diagnosis not present

## 2015-10-20 DIAGNOSIS — K219 Gastro-esophageal reflux disease without esophagitis: Secondary | ICD-10-CM | POA: Diagnosis not present

## 2015-10-24 DIAGNOSIS — D649 Anemia, unspecified: Secondary | ICD-10-CM | POA: Diagnosis not present

## 2015-10-24 DIAGNOSIS — G20 Parkinson's disease: Secondary | ICD-10-CM | POA: Diagnosis not present

## 2015-10-24 DIAGNOSIS — I255 Ischemic cardiomyopathy: Secondary | ICD-10-CM | POA: Diagnosis not present

## 2015-10-24 DIAGNOSIS — I480 Paroxysmal atrial fibrillation: Secondary | ICD-10-CM | POA: Diagnosis not present

## 2015-10-24 DIAGNOSIS — I2510 Atherosclerotic heart disease of native coronary artery without angina pectoris: Secondary | ICD-10-CM | POA: Diagnosis not present

## 2015-10-24 DIAGNOSIS — I5042 Chronic combined systolic (congestive) and diastolic (congestive) heart failure: Secondary | ICD-10-CM | POA: Diagnosis not present

## 2015-10-26 DIAGNOSIS — D649 Anemia, unspecified: Secondary | ICD-10-CM | POA: Diagnosis not present

## 2015-10-26 DIAGNOSIS — G20 Parkinson's disease: Secondary | ICD-10-CM | POA: Diagnosis not present

## 2015-10-26 DIAGNOSIS — I2510 Atherosclerotic heart disease of native coronary artery without angina pectoris: Secondary | ICD-10-CM | POA: Diagnosis not present

## 2015-10-26 DIAGNOSIS — I5042 Chronic combined systolic (congestive) and diastolic (congestive) heart failure: Secondary | ICD-10-CM | POA: Diagnosis not present

## 2015-10-26 DIAGNOSIS — I255 Ischemic cardiomyopathy: Secondary | ICD-10-CM | POA: Diagnosis not present

## 2015-10-26 DIAGNOSIS — I480 Paroxysmal atrial fibrillation: Secondary | ICD-10-CM | POA: Diagnosis not present

## 2015-10-28 DIAGNOSIS — G20 Parkinson's disease: Secondary | ICD-10-CM | POA: Diagnosis not present

## 2015-10-28 DIAGNOSIS — I480 Paroxysmal atrial fibrillation: Secondary | ICD-10-CM | POA: Diagnosis not present

## 2015-10-28 DIAGNOSIS — D649 Anemia, unspecified: Secondary | ICD-10-CM | POA: Diagnosis not present

## 2015-10-28 DIAGNOSIS — I2510 Atherosclerotic heart disease of native coronary artery without angina pectoris: Secondary | ICD-10-CM | POA: Diagnosis not present

## 2015-10-28 DIAGNOSIS — I5042 Chronic combined systolic (congestive) and diastolic (congestive) heart failure: Secondary | ICD-10-CM | POA: Diagnosis not present

## 2015-10-28 DIAGNOSIS — I255 Ischemic cardiomyopathy: Secondary | ICD-10-CM | POA: Diagnosis not present

## 2015-11-01 ENCOUNTER — Ambulatory Visit: Payer: Medicare Other | Admitting: Internal Medicine

## 2015-11-01 DIAGNOSIS — I5042 Chronic combined systolic (congestive) and diastolic (congestive) heart failure: Secondary | ICD-10-CM | POA: Diagnosis not present

## 2015-11-01 DIAGNOSIS — I255 Ischemic cardiomyopathy: Secondary | ICD-10-CM | POA: Diagnosis not present

## 2015-11-01 DIAGNOSIS — G20 Parkinson's disease: Secondary | ICD-10-CM | POA: Diagnosis not present

## 2015-11-01 DIAGNOSIS — D649 Anemia, unspecified: Secondary | ICD-10-CM | POA: Diagnosis not present

## 2015-11-01 DIAGNOSIS — I480 Paroxysmal atrial fibrillation: Secondary | ICD-10-CM | POA: Diagnosis not present

## 2015-11-01 DIAGNOSIS — I2510 Atherosclerotic heart disease of native coronary artery without angina pectoris: Secondary | ICD-10-CM | POA: Diagnosis not present

## 2015-11-04 DIAGNOSIS — I5042 Chronic combined systolic (congestive) and diastolic (congestive) heart failure: Secondary | ICD-10-CM | POA: Diagnosis not present

## 2015-11-04 DIAGNOSIS — I2510 Atherosclerotic heart disease of native coronary artery without angina pectoris: Secondary | ICD-10-CM | POA: Diagnosis not present

## 2015-11-04 DIAGNOSIS — I480 Paroxysmal atrial fibrillation: Secondary | ICD-10-CM | POA: Diagnosis not present

## 2015-11-04 DIAGNOSIS — G20 Parkinson's disease: Secondary | ICD-10-CM | POA: Diagnosis not present

## 2015-11-04 DIAGNOSIS — I255 Ischemic cardiomyopathy: Secondary | ICD-10-CM | POA: Diagnosis not present

## 2015-11-04 DIAGNOSIS — D649 Anemia, unspecified: Secondary | ICD-10-CM | POA: Diagnosis not present

## 2015-11-07 DIAGNOSIS — I5042 Chronic combined systolic (congestive) and diastolic (congestive) heart failure: Secondary | ICD-10-CM | POA: Diagnosis not present

## 2015-11-07 DIAGNOSIS — D649 Anemia, unspecified: Secondary | ICD-10-CM | POA: Diagnosis not present

## 2015-11-07 DIAGNOSIS — I480 Paroxysmal atrial fibrillation: Secondary | ICD-10-CM | POA: Diagnosis not present

## 2015-11-07 DIAGNOSIS — G20 Parkinson's disease: Secondary | ICD-10-CM | POA: Diagnosis not present

## 2015-11-07 DIAGNOSIS — I255 Ischemic cardiomyopathy: Secondary | ICD-10-CM | POA: Diagnosis not present

## 2015-11-07 DIAGNOSIS — I2510 Atherosclerotic heart disease of native coronary artery without angina pectoris: Secondary | ICD-10-CM | POA: Diagnosis not present

## 2015-12-16 ENCOUNTER — Ambulatory Visit (INDEPENDENT_AMBULATORY_CARE_PROVIDER_SITE_OTHER): Payer: Medicare Other | Admitting: Internal Medicine

## 2015-12-16 ENCOUNTER — Encounter: Payer: Self-pay | Admitting: Internal Medicine

## 2015-12-16 VITALS — BP 116/62 | HR 79 | Temp 97.4°F | Resp 14 | Ht 60.0 in | Wt 100.4 lb

## 2015-12-16 DIAGNOSIS — R0602 Shortness of breath: Secondary | ICD-10-CM | POA: Diagnosis not present

## 2015-12-16 DIAGNOSIS — D649 Anemia, unspecified: Secondary | ICD-10-CM

## 2015-12-16 LAB — CBC WITH DIFFERENTIAL/PLATELET
BASOS ABS: 0 10*3/uL (ref 0.0–0.1)
Basophils Relative: 0 % (ref 0–1)
Eosinophils Absolute: 0.4 10*3/uL (ref 0.0–0.7)
Eosinophils Relative: 4 % (ref 0–5)
HCT: 42.1 % (ref 36.0–46.0)
Hemoglobin: 13.5 g/dL (ref 12.0–15.0)
LYMPHS ABS: 2.5 10*3/uL (ref 0.7–4.0)
Lymphocytes Relative: 23 % (ref 12–46)
MCH: 29.5 pg (ref 26.0–34.0)
MCHC: 32.1 g/dL (ref 30.0–36.0)
MCV: 91.9 fL (ref 78.0–100.0)
MONOS PCT: 11 % (ref 3–12)
MPV: 9.8 fL (ref 8.6–12.4)
Monocytes Absolute: 1.2 10*3/uL — ABNORMAL HIGH (ref 0.1–1.0)
NEUTROS ABS: 6.7 10*3/uL (ref 1.7–7.7)
NEUTROS PCT: 62 % (ref 43–77)
PLATELETS: 296 10*3/uL (ref 150–400)
RBC: 4.58 MIL/uL (ref 3.87–5.11)
RDW: 18.5 % — AB (ref 11.5–15.5)
WBC: 10.8 10*3/uL — ABNORMAL HIGH (ref 4.0–10.5)

## 2015-12-16 NOTE — Progress Notes (Signed)
Pre visit review using our clinic review tool, if applicable. No additional management support is needed unless otherwise documented below in the visit note. 

## 2015-12-16 NOTE — Assessment & Plan Note (Signed)
Repeat CBC with labs today. Suspect iron tablets may have contributed to nausea and GERD.

## 2015-12-16 NOTE — Progress Notes (Signed)
Subjective:    Patient ID: Linda Hart, female    DOB: 31-Oct-1924, 82 y.o.   MRN: [id number]  HPI  82YO female presents for acute visit.  Initially was taking 6 pills at one time, with nausea and belching and dyspnea after. Now dividing to 4 and 2 with some improvement. Daughter wanted her to be seen.  Daughter arrived later in visit. Reports that pt had episode of severe shortness of breath after taking medications. She was called by pt who requested to be seen by physician. Husband checked pt and found her to be okay, just fatigued. She has had other episodes of sudden dyspnea in the past.  Wt Readings from Last 3 Encounters:  12/16/15 100 lb 6.4 oz (45.541 kg)  10/07/15 101 lb (45.813 kg)  09/28/15 103 lb 4 oz (46.834 kg)   BP Readings from Last 3 Encounters:  12/16/15 116/62  10/07/15 104/60  09/28/15 126/66    Past Medical History  Diagnosis Date  . Dizziness   . Hypotension   . Coronary artery disease 03/06/13    90% prox LAD stenosis s/p DES  . Ischemic cardiomyopathy 03/09/13    s/p NSTEMI. EF 30-35%.  Marland Kitchen GERD (gastroesophageal reflux disease)   . PAF (paroxysmal atrial fibrillation) ([place]) 03/06/13    In setting of NSTEMI and reperfusion, no recurrence  . SVT (supraventricular tachycardia) (HCC)     Self-limited on outpatient cardiac monitor  . MI (myocardial infarction) ([place])    Family History  Problem Relation Age of Onset  . Heart failure Mother   . Heart failure Father   . Heart attack Father    Past Surgical History  Procedure Laterality Date  . Cesarean section    . Coronary angioplasty with stent placement  02/2013    DES-mid LAD, mild LCx, RCA stenoses   Social History   Social History  . Marital Status: Widowed    Spouse Name: N/A  . Number of Children: N/A  . Years of Education: N/A   Social History Main Topics  . Smoking status: Never Smoker   . Smokeless tobacco: Never Used  . Alcohol Use: No     Comment: occasional  . Drug Use: No    . Sexual Activity: Not Asked   Other Topics Concern  . 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 Does not regularly exercise.     Review of Systems  Constitutional: Negative for fever, chills, appetite change, fatigue and unexpected weight change.  HENT: Negative for congestion, postnasal drip, rhinorrhea, sinus pressure, sneezing, sore throat, tinnitus and voice change.   Eyes: Negative for visual disturbance.  Respiratory: Positive for shortness of breath. Negative for chest tightness and wheezing.   Cardiovascular: Negative for chest pain, palpitations and leg swelling.  Gastrointestinal: Negative for nausea, vomiting, abdominal pain, diarrhea and constipation.  Musculoskeletal: Negative for myalgias and arthralgias.  Skin: Negative for color change and rash.  Hematological: Negative for adenopathy. Does not bruise/bleed easily.  Psychiatric/Behavioral: Negative for suicidal ideas, sleep disturbance and dysphoric mood. The patient is not nervous/anxious.        Objective:    BP 116/62 mmHg  Pulse 79  Temp(Src) 97.4 F (36.3 C) (Oral)  Resp 14  Ht 5' (1.524 m)  Wt 100 lb 6.4 oz (45.541 kg)  BMI 19.61 kg/m2  SpO2 98% Physical Exam  Constitutional: She is oriented to person, place, and time. She appears well-developed and well-nourished. No distress.  HENT:  Head: Normocephalic and  atraumatic.  Right Ear: External ear normal.  Left Ear: External ear normal.  Nose: Nose normal.  Mouth/Throat: Oropharynx is clear and moist. No oropharyngeal exudate.  Eyes: Conjunctivae are normal. Pupils are equal, round, and reactive to light. Right eye exhibits no discharge. Left eye exhibits no discharge. No scleral icterus.  Neck: Normal range of motion. Neck supple. No tracheal deviation present. No thyromegaly present.  Cardiovascular: Normal rate, regular rhythm, normal heart sounds and intact distal pulses.   No extrasystoles are present. Exam reveals no gallop and  no friction rub.   No murmur heard. Pulmonary/Chest: Effort normal and breath sounds normal. No accessory muscle usage. No tachypnea. No respiratory distress. She has no decreased breath sounds. She has no wheezes. She has no rhonchi. She has no rales. She exhibits no tenderness.  Musculoskeletal: Normal range of motion. She exhibits no edema or tenderness.  Lymphadenopathy:    She has no cervical adenopathy.  Neurological: She is alert and oriented to person, place, and time. No cranial nerve deficit. She exhibits normal muscle tone. Coordination normal.  Skin: Skin is warm and dry. No rash noted. She is not diaphoretic. No erythema. No pallor.  Psychiatric: She has a normal mood and affect. Her behavior is normal. Judgment and thought content normal.          Assessment & Plan:   Problem List Items Addressed This Visit      Unprioritized   Anemia    Repeat CBC with labs today. Suspect iron tablets may have contributed to nausea and GERD.       Relevant Orders   CBC with Differential/Platelet   Shortness of breath - Primary    Recent episode of dyspnea after taking medication. Exam today is normal. Pt is walking at home for exercise with no dyspnea. Suspect episode of bronchospasm possibly exacerbated by GERD.  Will monitor. Recheck CBC and CMP today.      Relevant Orders   Comprehensive metabolic panel       Return in about 4 weeks (around 01/13/2016) for Recheck.

## 2015-12-16 NOTE — Patient Instructions (Signed)
Labs today to recheck hemoglobin.  Follow up in 3 months.

## 2015-12-16 NOTE — Assessment & Plan Note (Signed)
Recent episode of dyspnea after taking medication. Exam today is normal. Pt is walking at home for exercise with no dyspnea. Suspect episode of bronchospasm possibly exacerbated by GERD.  Will monitor. Recheck CBC and CMP today.

## 2015-12-17 LAB — COMPREHENSIVE METABOLIC PANEL
ALBUMIN: 3.7 g/dL (ref 3.6–5.1)
ALK PHOS: 108 U/L (ref 33–130)
ALT: 19 U/L (ref 6–29)
AST: 29 U/L (ref 10–35)
BUN: 24 mg/dL (ref 7–25)
CALCIUM: 9 mg/dL (ref 8.6–10.4)
CO2: 24 mmol/L (ref 20–31)
Chloride: 105 mmol/L (ref 98–110)
Creat: 1.28 mg/dL — ABNORMAL HIGH (ref 0.60–0.88)
Glucose, Bld: 108 mg/dL — ABNORMAL HIGH (ref 65–99)
POTASSIUM: 4.5 mmol/L (ref 3.5–5.3)
Sodium: 137 mmol/L (ref 135–146)
Total Bilirubin: 0.6 mg/dL (ref 0.2–1.2)
Total Protein: 6.5 g/dL (ref 6.1–8.1)

## 2015-12-20 ENCOUNTER — Encounter: Payer: Self-pay | Admitting: Internal Medicine

## 2015-12-21 ENCOUNTER — Ambulatory Visit: Payer: Medicare Other | Admitting: Cardiovascular Disease

## 2016-01-23 ENCOUNTER — Other Ambulatory Visit: Payer: Self-pay | Admitting: Internal Medicine

## 2016-03-29 ENCOUNTER — Telehealth: Payer: Self-pay | Admitting: Cardiovascular Disease

## 2016-03-29 NOTE — Telephone Encounter (Signed)
Pt daughter calling stating pt was having extreme fatigued and extremely tired.  This is been going on for about a week now but seems to be getting worst.  States she can't even walk in her room without getting SOB  States she can't even stand in lunch line long for she starts to get SOB  Pt is coming 04/20/16 at 8 am to see Dr Rockey Situ and is on wait list  [place].  Please advise.   Patient daughter Roque Lias) initially called to make her and her mother's appointments  Pt is due back with [place] in July and pt mother is due in June (Both are patients of Dr Rockey Situ) When I gave them the dates she think she needs to be seen sooner than her for she is just not feeling well.  She told me her mother is having issues as well, she is having SOB and can't walk from one side of her room to the other without getting SOB I suggested that patient mother be seen in June instead of July and patient did not agree. She felt she needed to be seen sooner than her mother. That her issue was more pressing then her mothers.  Please advise.

## 2016-03-29 NOTE — Telephone Encounter (Signed)
Pt added to wait list in the event of a cancellation.

## 2016-04-20 ENCOUNTER — Encounter: Payer: Self-pay | Admitting: Cardiovascular Disease

## 2016-04-20 ENCOUNTER — Ambulatory Visit (INDEPENDENT_AMBULATORY_CARE_PROVIDER_SITE_OTHER): Payer: Medicare Other | Admitting: Cardiovascular Disease

## 2016-04-20 ENCOUNTER — Ambulatory Visit: Payer: Medicare Other | Admitting: Cardiovascular Disease

## 2016-04-20 VITALS — BP 140/60 | HR 79 | Ht 59.0 in | Wt 96.8 lb

## 2016-04-20 DIAGNOSIS — I471 Supraventricular tachycardia, unspecified: Secondary | ICD-10-CM

## 2016-04-20 DIAGNOSIS — I10 Essential (primary) hypertension: Secondary | ICD-10-CM

## 2016-04-20 DIAGNOSIS — I2510 Atherosclerotic heart disease of native coronary artery without angina pectoris: Secondary | ICD-10-CM

## 2016-04-20 DIAGNOSIS — I480 Paroxysmal atrial fibrillation: Secondary | ICD-10-CM

## 2016-04-20 DIAGNOSIS — R630 Anorexia: Secondary | ICD-10-CM | POA: Insufficient documentation

## 2016-04-20 DIAGNOSIS — R001 Bradycardia, unspecified: Secondary | ICD-10-CM

## 2016-04-20 NOTE — Progress Notes (Signed)
Patient ID: Linda Hart, female   DOB: 06-Sep-1925, 82 y.o.   MRN: [id number] Cardiology Office Note  Date:  04/20/2016   ID:  Linda Hart, Linda Hart June 16, 1925, MRN [id number]  PCP:  Ronette Deter, MD   Chief Complaint  Patient presents with  . Other    6 month f/u. Meds reviewed verbally with pt.    HPI:  Linda Hart is a very pleasant 82 year old woman with PMHx s/f CAD (NSTEMI 03/06/13 s/p DES-LAD), PAF (in setting of NSTEMI and reperfusion), ischemic cardiomyopathy (EF 30-35% on 03/09/13 echo), SVT (self-limited on OP monitor), GERD, remote h/o lightheadedness and syncope who was re-admitted to [facility] with worsening DOE/SOB may 2014. She presents for routine followup from skilled nursing facility. She lives at the [facility] She presents today for follow-up of her coronary artery disease  In follow-up today, reports that she is doing well Tolerating her medications, reports energy is good, walking at [place] Denies any tachycardia concerning for arrhythmia There was one episode of bradycardia, she was symptomatic, did not call our office Symptoms resolved on their own Main issue is continued weight loss, most recently 5 pounds since her last clinic visit. Reports she is not hungry  EKG on today's visit showing no sinus rhythm with rate 79 bpm, no significant ST or T-wave changes  Other past medical history She was admitted 03/05/13 with NSTEMI.  cardiac catheterization revealing tubular 90% prox extending to mid LAD stenosis s/p DES, mild atheresclerosis in LCx, RCA.  Post-cath echo showed EF 30-35%, severe anterior, septal and apical HK, mild TR/MR, mildly elevated EF described as reduced. DAPT- ASA/Brilinta x 12 months recommended.  Cr did bump to 1.5-1.6 suspected to be secondary to contrast induced precluding the addition of ACEi/ARB. Limited repeat echo indicated LVEF 30-35%. She declined LifeVest.   After discharge from the hospital, creatinine was >2, Lasix was  held. Creatinine in June 2014 improved to 1.5.  Previous event monitor showed frequent supraventricular ectopy, rare PVCs.  She had short runs of SVT, the longest was 11 beats. She had 46 short runs. Uncertain if she was symptomatic during these short runs of SVT  Echocardiogram may 2014 with ejection fraction 30-35%, mild to moderate pulmonary hypertension  PMH:   has a past medical history of Dizziness; Hypotension; Coronary artery disease (03/06/13); Ischemic cardiomyopathy (03/09/13); GERD (gastroesophageal reflux disease); PAF (paroxysmal atrial fibrillation) ([place]) (03/06/13); SVT (supraventricular tachycardia) ([place]); and MI (myocardial infarction) ([place]).  PSH:    Past Surgical History  Procedure Laterality Date  . Cesarean section    . Coronary angioplasty with stent placement  02/2013    DES-mid LAD, mild LCx, RCA stenoses    Current Outpatient Prescriptions  Medication Sig Dispense Refill  . aspirin 81 MG tablet Take 81 mg by mouth daily.      Marland Kitchen atorvastatin (LIPITOR) 20 MG tablet Take 1 tablet (20 mg total) by mouth daily at 6 PM. (Patient taking differently: Take 20 mg by mouth at bedtime. ) 90 tablet 3  . carvedilol (COREG) 3.125 MG tablet TAKE ONE TABLET TWICE A DAY WITH MEALS. 60 tablet 11  . ferrous sulfate (KP FERROUS SULFATE) 325 (65 FE) MG tablet Take 325 mg by mouth 2 (two) times daily with a meal.    . furosemide (LASIX) 40 MG tablet Take 40 mg by mouth daily as needed for fluid.    Marland Kitchen levalbuterol (XOPENEX HFA) 45 MCG/ACT inhaler Inhale 1-2 puffs into the lungs every 4 (four) hours as needed for wheezing.  Reported on 12/16/2015    . loratadine (CLARITIN) 10 MG tablet Take 1 tablet (10 mg total) by mouth daily. 30 tablet 11  . nitroGLYCERIN (NITROSTAT) 0.4 MG SL tablet Place 0.4 mg under the tongue every 5 (five) minutes as needed for chest pain.    . vitamin C (ASCORBIC ACID) 500 MG tablet Take 500 mg by mouth 2 (two) times daily.     No current facility-administered  medications for this visit.     Allergies:   Penicillins   Social History:  The patient  reports that she has never smoked. She has never used smokeless tobacco. She reports that she does not drink alcohol or use illicit drugs.   Family History:   family history includes Heart attack in her father; Heart failure in her father and mother.    Review of Systems: Review of Systems  Constitutional: Negative.   Respiratory: Negative.   Cardiovascular: Negative.   Gastrointestinal: Negative.   Musculoskeletal: Negative.   Neurological: Negative.   Psychiatric/Behavioral: Negative.   All other systems reviewed and are negative.    PHYSICAL EXAM: VS:  BP 140/60 mmHg  Pulse 79  Ht 4\' 11"  (1.499 m)  Wt 96 lb 12 oz (43.886 kg)  BMI 19.53 kg/m2 , BMI Body mass index is 19.53 kg/(m^2). GEN: Well nourished, well developed, in no acute distress, thin HEENT: normal Neck: no JVD, carotid bruits, or masses Cardiac: RRR; no murmurs, rubs, or gallops,no edema  Respiratory:  clear to auscultation bilaterally, normal work of breathing GI: soft, nontender, nondistended, + BS MS: no deformity or atrophy Skin: warm and dry, no rash Neuro:  Strength and sensation are intact Psych: euthymic mood, full affect    Recent Labs: 09/23/2015: B Natriuretic Peptide 1298.0*; TSH 6.520* 12/16/2015: ALT 19; BUN 24; Creat 1.28*; Hemoglobin 13.5; Platelets 296; Potassium 4.5; Sodium 137    Lipid Panel Lab Results  Component Value Date   CHOL 120 09/19/2015   HDL 59.60 09/19/2015   LDLCALC 48 09/19/2015   TRIG 60.0 09/19/2015      Wt Readings from Last 3 Encounters:  04/20/16 96 lb 12 oz (43.886 kg)  12/16/15 100 lb 6.4 oz (45.541 kg)  10/07/15 101 lb (45.813 kg)       ASSESSMENT AND PLAN:  SVT (supraventricular tachycardia) ([place]) - Plan: EKG 12-Lead Denies any recent symptoms concerning for arrhythmia  Essential hypertension - Plan: EKG 12-Lead Blood pressure is well controlled on  today's visit. No changes made to the medications.  CAD in native artery - Plan: EKG 12-Lead Currently with no symptoms of angina. No further workup at this time. Continue current medication regimen.  PAF (paroxysmal atrial fibrillation) (HCC) - Plan: EKG 12-Lead No palpitations concerning for atrial fibrillation  Bradycardia Recommended she call us for any further episodes of bradycardia, we would order a monitor  Anorexia Most of the visit was spent discussing her continued weight loss Daughter was at the visit today and is concerned They are trying everything they can   Total encounter time more than 15 minutes  Greater than 50% was spent in counseling and coordination of care with the patient   Disposition:   F/U  6 months  Orders Placed This Encounter  Procedures  . EKG 12-Lead     Signed, Esmond Plants, M.D., Ph.D. 04/20/2016  [place], [place]

## 2016-04-20 NOTE — Patient Instructions (Addendum)
Medication Instructions:   No changes   EAT EAT EAT!   Follow-Up: It was a pleasure seeing you in the office today. Please call us if you have new issues that need to be addressed before your next appt.  [phone]  Your physician wants you to follow-up in: 6 months.  You will receive a reminder letter in the mail two months in advance. If you don't receive a letter, please call our office to schedule the follow-up appointment.  If you need a refill on your cardiac medications before your next appointment, please call your pharmacy.

## 2016-07-09 ENCOUNTER — Ambulatory Visit: Payer: Medicare Other | Admitting: Family Medicine

## 2016-07-24 DIAGNOSIS — Z23 Encounter for immunization: Secondary | ICD-10-CM | POA: Diagnosis not present

## 2016-08-23 ENCOUNTER — Encounter: Payer: Self-pay | Admitting: Family Medicine

## 2016-08-23 ENCOUNTER — Ambulatory Visit (INDEPENDENT_AMBULATORY_CARE_PROVIDER_SITE_OTHER): Payer: Medicare Other | Admitting: Family Medicine

## 2016-08-23 VITALS — BP 120/76 | HR 77 | Temp 97.8°F | Wt 98.8 lb

## 2016-08-23 DIAGNOSIS — I10 Essential (primary) hypertension: Secondary | ICD-10-CM | POA: Diagnosis not present

## 2016-08-23 DIAGNOSIS — I5043 Acute on chronic combined systolic (congestive) and diastolic (congestive) heart failure: Secondary | ICD-10-CM | POA: Diagnosis not present

## 2016-08-23 DIAGNOSIS — E785 Hyperlipidemia, unspecified: Secondary | ICD-10-CM

## 2016-08-23 DIAGNOSIS — I2510 Atherosclerotic heart disease of native coronary artery without angina pectoris: Secondary | ICD-10-CM | POA: Diagnosis not present

## 2016-08-23 NOTE — Assessment & Plan Note (Signed)
Tolerating Lipitor. Check CMP and direct LDL. 

## 2016-08-23 NOTE — Patient Instructions (Addendum)
Nice to see you. We'll check some lab work and call with the results.

## 2016-08-23 NOTE — Progress Notes (Signed)
Pre visit review using our clinic review tool, if applicable. No additional management support is needed unless otherwise documented below in the visit note. 

## 2016-08-23 NOTE — Assessment & Plan Note (Signed)
Weight stable. Asymptomatic. Continue as needed Lasix.

## 2016-08-23 NOTE — Progress Notes (Signed)
  Tommi Rumps, MD Phone: [phone]  Linda Hart is a 82 y.o. female who presents today for f/u.  HYPERTENSION  Disease Monitoring  Home BP Monitoring not checking Chest pain- no    Dyspnea- no Medications  Compliance-  taking Coreg, intermittent Lasix  Edema- not currently, does take Lasix if her legs swell  CHF: Appears that her EF improved on last check. She notes no orthopnea or PND. Occasionally takes Lasix for swelling. Weight has been stable over the last year.  HYPERLIPIDEMIA Symptoms Chest pain on exertion:  No   Medications: Compliance- taking Lipitor Right upper quadrant pain- no  Muscle aches- no   PMH: nonsmoker.   ROS see history of present illness  Objective  Physical Exam Vitals:   08/23/16 1540  BP: 120/76  Pulse: 77  Temp: 97.8 F (36.6 C)    BP Readings from Last 3 Encounters:  08/23/16 120/76  04/20/16 140/60  12/16/15 116/62   Wt Readings from Last 3 Encounters:  08/23/16 98 lb 12.8 oz (44.8 kg)  04/20/16 96 lb 12 oz (43.9 kg)  12/16/15 100 lb 6.4 oz (45.5 kg)    Physical Exam  Constitutional: No distress.  Cardiovascular: Normal rate, regular rhythm and normal heart sounds.   Pulmonary/Chest: Effort normal and breath sounds normal.  Musculoskeletal: She exhibits no edema.  Neurological: She is alert. Gait normal.  Skin: Skin is warm and dry. She is not diaphoretic.     Assessment/Plan: Please see individual problem list.  Hypertension At goal. Continue current medication.  Systolic and diastolic CHF, acute on chronic (HCC) Weight stable. Asymptomatic. Continue as needed Lasix.  Hyperlipidemia Tolerating Lipitor. Check CMP and direct LDL.   Orders Placed This Encounter  Procedures  . Comp Met (CMET)  . Direct LDL    Tommi Rumps, MD [place]

## 2016-08-23 NOTE — Assessment & Plan Note (Signed)
At goal. Continue current medication. 

## 2016-08-24 LAB — COMPREHENSIVE METABOLIC PANEL
ALK PHOS: 107 U/L (ref 39–117)
ALT: 22 U/L (ref 0–35)
AST: 29 U/L (ref 0–37)
Albumin: 3.7 g/dL (ref 3.5–5.2)
BILIRUBIN TOTAL: 0.9 mg/dL (ref 0.2–1.2)
BUN: 30 mg/dL — ABNORMAL HIGH (ref 6–23)
CALCIUM: 9.1 mg/dL (ref 8.4–10.5)
CO2: 25 mEq/L (ref 19–32)
Chloride: 104 mEq/L (ref 96–112)
Creatinine, Ser: 1.24 mg/dL — ABNORMAL HIGH (ref 0.40–1.20)
GFR: 43.3 mL/min — AB (ref >60.00)
GLUCOSE: 79 mg/dL (ref 70–99)
Potassium: 4.8 mEq/L (ref 3.5–5.1)
Sodium: 137 mEq/L (ref 135–145)
TOTAL PROTEIN: 6.3 g/dL (ref 6.0–8.3)

## 2016-08-24 LAB — LDL CHOLESTEROL, DIRECT: Direct LDL: 60 mg/dL

## 2016-08-25 ENCOUNTER — Other Ambulatory Visit: Payer: Self-pay | Admitting: Cardiovascular Disease

## 2016-08-27 NOTE — Telephone Encounter (Signed)
Please advise ok to refill?  

## 2016-08-29 ENCOUNTER — Other Ambulatory Visit: Payer: Self-pay

## 2016-08-29 ENCOUNTER — Telehealth: Payer: Self-pay | Admitting: Cardiovascular Disease

## 2016-08-29 ENCOUNTER — Telehealth: Payer: Self-pay | Admitting: *Deleted

## 2016-08-29 MED ORDER — TICAGRELOR 60 MG PO TABS: 60.0000 mg | ORAL_TABLET | Freq: Two times a day (BID) | ORAL | 6 refills | Status: DC

## 2016-08-29 MED ORDER — ATORVASTATIN CALCIUM 20 MG PO TABS: 20.0000 mg | ORAL_TABLET | Freq: Every day | ORAL | 1 refills | Status: DC

## 2016-08-29 NOTE — Telephone Encounter (Signed)
Pt requesting Rx for Brilinta not on current medication list please advise if ok to refill?

## 2016-08-29 NOTE — Telephone Encounter (Signed)
[place] requested medication refill for Lipitor Pharmacy  [phone]

## 2016-08-29 NOTE — Telephone Encounter (Signed)
Pt daughter states pt also needs Brylinta refill. States pt is completely out.

## 2016-08-29 NOTE — Telephone Encounter (Signed)
Pharmacy calling stating pt is in need of Bralinta 60 mg twice a day Refilled Pharmacy looked back and saw that we sent in a refill last year with 11 refills on it  Please advise.

## 2016-08-29 NOTE — Telephone Encounter (Signed)
RX sent

## 2016-08-29 NOTE — Telephone Encounter (Signed)
Please review this patient's chart for samples, I do not see she is on Brilinta.

## 2016-08-29 NOTE — Telephone Encounter (Signed)
Prescription sent in for Brilinta 60 mg twice a day.

## 2016-08-29 NOTE — Telephone Encounter (Signed)
Okay to refill brilinta 60 mg twice a day

## 2016-08-29 NOTE — Telephone Encounter (Addendum)
Anselm Pancoast, CMA  You 34 minutes ago (2:34 PM)    Please review this patient's chart for samples, I do not see she is on Brilinta.    Documentation     Linda Hart routed conversation to [facility] Refill 6 hours ago (9:02 AM)    Linda Hart 6 hours ago (9:01 AM)    Pt daughter states pt also needs Brylinta refill. States pt is completely out.   Documentation     Linda Hart,Linda Hart [phone]  Linda Hart    S/w pt daughter, Linda Hart, who states the pharmacist told her pt was taking Brilinta. Explained I do not see a prescription for Brilinta to which she replied pt has been taking samples. Asked which provider gave her samples and she does not remember then mentioned the pharmacy. She then replied "that's ok. You don't have to refill that one". She then inquired of atorvastatin refill. Informed daughter that this it being monitored by PCP and they should contact Dr. Ellen Henri office for refills. She states the nurse at [facility] usually gives pt her medications but she was not there today. Linda Hart verbalized understanding to contact PCP for atorvastatin refills. She had no further questions at this time.

## 2016-09-03 NOTE — Telephone Encounter (Signed)
ticagrelor (BRILINTA) 60 MG TABS tablet  Medication  Date: 08/29/2016 Department: [place] Ordering/Authorizing: Minna Merritts, MD  Order Providers   Prescribing Provider Encounter Provider  Minna Merritts, MD Minna Merritts, MD  Medication Detail    Disp Refills Start End   ticagrelor (BRILINTA) 60 MG TABS tablet 60 tablet 6 08/29/2016    Sig - Route: Take 1 tablet (60 mg total) by mouth 2 (two) times daily. - Oral   E-Prescribing Status: Receipt confirmed by pharmacy (08/29/2016 5:14 PM EDT)   Pharmacy   [place], [place] [address]

## 2016-09-25 ENCOUNTER — Telehealth: Payer: Self-pay | Admitting: Family Medicine

## 2016-09-25 ENCOUNTER — Other Ambulatory Visit: Payer: Self-pay | Admitting: Family Medicine

## 2016-09-25 NOTE — Telephone Encounter (Signed)
Pt daughter will call back to sch AWV. Thank you!

## 2016-09-25 NOTE — Telephone Encounter (Signed)
Okay to refill? Not on med list (this is a new patient to you). Last seen in October.

## 2016-09-27 NOTE — Telephone Encounter (Signed)
She needs her B12 to be checked for a refill of this. It looks like it's been about a year since it was checked. Thanks.

## 2016-10-03 NOTE — Telephone Encounter (Signed)
LM for patient to return call.

## 2016-10-04 NOTE — Telephone Encounter (Signed)
LM for patient to return call.

## 2016-10-25 ENCOUNTER — Other Ambulatory Visit: Payer: Self-pay | Admitting: Family Medicine

## 2016-10-25 DIAGNOSIS — E538 Deficiency of other specified B group vitamins: Secondary | ICD-10-CM

## 2016-10-25 NOTE — Telephone Encounter (Signed)
Lab scheduled for 11/05/16,please place order

## 2016-11-05 ENCOUNTER — Other Ambulatory Visit (INDEPENDENT_AMBULATORY_CARE_PROVIDER_SITE_OTHER): Payer: Medicare Other

## 2016-11-05 DIAGNOSIS — E538 Deficiency of other specified B group vitamins: Secondary | ICD-10-CM

## 2016-11-05 LAB — VITAMIN B12: VITAMIN B 12: 505 pg/mL (ref 211–911)

## 2016-11-06 ENCOUNTER — Telehealth: Payer: Self-pay

## 2016-11-06 NOTE — Telephone Encounter (Signed)
Patients daughter was notified of b12 lab results

## 2016-11-07 NOTE — Telephone Encounter (Signed)
On further review of the patients chart. I do not see where the patient has gotten this medicine in the last year. I also do not see where her B12 has actually been low previously. Please contact the patient to see if she has been giving herself B12 shots and if she has had low B12 in the past. Thanks.

## 2016-11-07 NOTE — Telephone Encounter (Signed)
Patient states she gets this injection every month from Cecille Rubin ( a nurse at [facility] [place]) Patient states she has not had any labs done to check this.

## 2016-11-08 MED ORDER — CYANOCOBALAMIN 1000 MCG/ML IJ SOLN: 1000.0000 ug | INTRAMUSCULAR | 2 refills | Status: DC

## 2016-11-08 NOTE — Telephone Encounter (Signed)
Will send in short term refill until we can determine if she has been getting this medication. Please try to confirm this with with this nurse. Thanks.

## 2016-11-09 NOTE — Telephone Encounter (Signed)
fyi

## 2016-11-09 NOTE — Telephone Encounter (Signed)
Left message to return call 

## 2016-11-09 NOTE — Telephone Encounter (Signed)
Called nurses station at [facility] [phone], spoke to [place], she states the patient has "usually" been getting this monthly, asked her to verify if this is actually monthly or just some months, just to clarify "usually". She states she does not have her record because she was in between patients. She states the resident get this injection if they request it and if they don't then they do not receive this.

## 2016-11-12 NOTE — Telephone Encounter (Signed)
Noted. Already sent a refill in.

## 2016-11-14 ENCOUNTER — Ambulatory Visit: Payer: Medicare Other

## 2016-11-21 ENCOUNTER — Encounter: Payer: Self-pay | Admitting: Cardiovascular Disease

## 2016-11-21 ENCOUNTER — Ambulatory Visit (INDEPENDENT_AMBULATORY_CARE_PROVIDER_SITE_OTHER): Payer: Medicare Other | Admitting: Cardiovascular Disease

## 2016-11-21 ENCOUNTER — Telehealth: Payer: Self-pay | Admitting: Family Medicine

## 2016-11-21 VITALS — BP 138/64 | HR 77 | Ht 59.0 in | Wt 98.5 lb

## 2016-11-21 DIAGNOSIS — E782 Mixed hyperlipidemia: Secondary | ICD-10-CM | POA: Diagnosis not present

## 2016-11-21 DIAGNOSIS — I10 Essential (primary) hypertension: Secondary | ICD-10-CM | POA: Diagnosis not present

## 2016-11-21 DIAGNOSIS — R630 Anorexia: Secondary | ICD-10-CM

## 2016-11-21 DIAGNOSIS — I480 Paroxysmal atrial fibrillation: Secondary | ICD-10-CM | POA: Diagnosis not present

## 2016-11-21 DIAGNOSIS — I2510 Atherosclerotic heart disease of native coronary artery without angina pectoris: Secondary | ICD-10-CM | POA: Diagnosis not present

## 2016-11-21 DIAGNOSIS — I255 Ischemic cardiomyopathy: Secondary | ICD-10-CM

## 2016-11-21 DIAGNOSIS — I5043 Acute on chronic combined systolic (congestive) and diastolic (congestive) heart failure: Secondary | ICD-10-CM

## 2016-11-21 NOTE — Progress Notes (Signed)
Cardiology Office Note  Date:  11/21/2016   ID:  Afi, Chestang 1925/04/22, MRN [id number]  PCP:  Tommi Rumps, MD   Chief Complaint  Patient presents with  . other    6 month f/u no complaints today. Pt didn't bring medications today mentioned that "she takes what she takes she does not know." Meds reviewed verbally with pt.     HPI:  Ms. Linda Hart is a very pleasant 83 year old woman with PMHx s/f CAD (NSTEMI 03/06/13 s/p DES-LAD), PAF (in setting of NSTEMI and reperfusion), ischemic cardiomyopathy (EF 30-35% on 03/09/13 echo), SVT (self-limited on OP monitor), GERD, remote h/o lightheadedness and syncope who was re-admitted to [facility] with worsening DOE/SOB may 2014. She presents for routine followup from skilled nursing facility. She lives at the [facility] She presents today for follow-up of her coronary artery disease  On follow-up today she reports that she feels well She denies any significant chest pain on exertion, no shortness of breath  [place]   Weight is stable, 98 pounds  Lab work reviewed with her in detail LDL 60,  CR 1.2 in 2017, creatinine was 1.4 up to 1.5 in 2016  Denies any tachycardia concerning for arrhythmia On prior office visit she reported one episode of bradycardia, she was symptomatic, did not call our office  EKG on today's visit showing no sinus rhythm with rate 77 bpm, no significant ST or T-wave changes  Other past medical history She was admitted 03/05/13 with NSTEMI.  cardiac catheterization revealing tubular 90% prox extending to mid LAD stenosis s/p DES, mild atheresclerosis in LCx, RCA.  Post-cath echo showed EF 30-35%, severe anterior, septal and apical HK, mild TR/MR, mildly elevated EF described as reduced. DAPT- ASA/Brilinta x 12 months recommended.  Cr did bump to 1.5-1.6 suspected to be secondary to contrast induced precluding the addition of ACEi/ARB. Limited  repeat echo indicated LVEF 30-35%. She declined LifeVest.   After discharge from the hospital, creatinine was >2, Lasix was held. Creatinine in June 2014 improved to 1.5.  Previous event monitor showed frequent supraventricular ectopy, rare PVCs.  She had short runs of SVT, the longest was 11 beats. She had 46 short runs. Uncertain if she was symptomatic during these short runs of SVT  Echocardiogram may 2014 with ejection fraction 30-35%, mild to moderate pulmonary hypertension  PMH:   has a past medical history of Coronary artery disease (03/06/13); Dizziness; GERD (gastroesophageal reflux disease); Hypotension; Ischemic cardiomyopathy (03/09/13); MI (myocardial infarction); PAF (paroxysmal atrial fibrillation) ([place]) (03/06/13); and SVT (supraventricular tachycardia) ([place]).  PSH:    Past Surgical History:  Procedure Laterality Date  . CESAREAN SECTION    . CORONARY ANGIOPLASTY WITH STENT PLACEMENT  02/2013   DES-mid LAD, mild LCx, RCA stenoses    Current Outpatient Prescriptions  Medication Sig Dispense Refill  . aspirin 81 MG tablet Take 81 mg by mouth daily.      Marland Kitchen atorvastatin (LIPITOR) 20 MG tablet Take 1 tablet (20 mg total) by mouth at bedtime. 90 tablet 1  . carvedilol (COREG) 3.125 MG tablet TAKE ONE TABLET TWICE A DAY WITH MEALS. 60 tablet 11  . cyanocobalamin (,VITAMIN B-12,) 1000 MCG/ML injection Inject 1 mL (1,000 mcg total) into the muscle every 30 (thirty) days. 1 mL 2  . ferrous sulfate (KP FERROUS SULFATE) 325 (65 FE) MG tablet Take 325 mg by mouth 2 (two) times daily with a meal.    .  levalbuterol (XOPENEX HFA) 45 MCG/ACT inhaler Inhale 1-2 puffs into the lungs every 4 (four) hours as needed for wheezing. Reported on 12/16/2015    . loratadine (CLARITIN) 10 MG tablet Take 1 tablet (10 mg total) by mouth daily. 30 tablet 11  . nitroGLYCERIN (NITROSTAT) 0.4 MG SL tablet Place 0.4 mg under the tongue every 5 (five) minutes as needed for chest pain.    . ticagrelor  (BRILINTA) 60 MG TABS tablet Take 1 tablet (60 mg total) by mouth 2 (two) times daily. 60 tablet 6  . vitamin C (ASCORBIC ACID) 500 MG tablet Take 500 mg by mouth 2 (two) times daily.     No current facility-administered medications for this visit.      Allergies:   Penicillins   Social History:  The patient  reports that she has never smoked. She has never used smokeless tobacco. She reports that she does not drink alcohol or use drugs.   Family History:   family history includes Heart attack in her father; Heart failure in her father and mother.    Review of Systems: Review of Systems  Constitutional: Negative.   Respiratory: Negative.   Cardiovascular: Negative.   Gastrointestinal: Negative.   Musculoskeletal: Negative.   Neurological: Negative.   Psychiatric/Behavioral: Negative.   All other systems reviewed and are negative.    PHYSICAL EXAM: VS:  BP 138/64 (BP Location: Left Arm, Patient Position: Sitting, Cuff Size: Normal)   Pulse 77   Ht 4\' 11"  (1.499 m)   Wt 98 lb 8 oz (44.7 kg)   BMI 19.89 kg/m  , BMI Body mass index is 19.89 kg/m. GEN:  Thin, in no acute distress  HEENT: normal  Neck: no JVD, carotid bruits, or masses Cardiac: RRR; no murmurs, rubs, or gallops,no edema  Respiratory:  clear to auscultation bilaterally, normal work of breathing GI: soft, nontender, nondistended, + BS MS: no deformity or atrophy  Skin: warm and dry, no rash Neuro:  Strength and sensation are intact Psych: euthymic mood, full affect    Recent Labs: 12/16/2015: Hemoglobin 13.5; Platelets 296 08/23/2016: ALT 22; BUN 30; Creatinine, Ser 1.24; Potassium 4.8; Sodium 137    Lipid Panel Lab Results  Component Value Date   CHOL 120 09/19/2015   HDL 59.60 09/19/2015   LDLCALC 48 09/19/2015   TRIG 60.0 09/19/2015      Wt Readings from Last 3 Encounters:  11/21/16 98 lb 8 oz (44.7 kg)  08/23/16 98 lb 12.8 oz (44.8 kg)  04/20/16 96 lb 12 oz (43.9 kg)       ASSESSMENT  AND PLAN:  Paroxysmal atrial fibrillation (HCC) - Plan: EKG 12-Lead Maintaining normal sinus rhythm, currently not on anticoagulation She denies any symptoms concerning for arrhythmia  Essential hypertension Blood pressure is well controlled on today's visit. No changes made to the medications.  Cardiomyopathy, ischemic Previous echocardiogram several years ago ejection fraction 40-45% Patient declining repeat echocardiogram as she feels well  Mixed hyperlipidemia Cholesterol is at goal on the current lipid regimen. No changes to the medications were made.  CAD in native artery Currently with no symptoms of angina, exercising on a daily basis  Systolic and diastolic CHF, acute on chronic (HCC) Appears relatively euvolemic on today's visit Previous echocardiogram results discussed with her No changes to her medications. She does not require diuresis at this time  Anorexia Encouraged her to increase her calorie intake Does not have an appetite, chronic issue   Total encounter time more than 25 minutes  Greater than 50% was spent in counseling and coordination of care with the patient   Disposition:   F/U  6 months   Orders Placed This Encounter  Procedures  . EKG 12-Lead     Signed, Esmond Plants, M.D., Ph.D. 11/21/2016  [place], [place]

## 2016-11-21 NOTE — Telephone Encounter (Signed)
Pharmacy called and stated that the facility has been splitting her atorvastatin (LIPITOR) 20 MG tablet, stating that the dosage was decreased. Pharmacy would like to know if they could get a new rx for the atorvastatin 10 mg. Please advise, thank you!  Pharmacy - [place], [place] - [place]

## 2016-11-21 NOTE — Telephone Encounter (Signed)
Please advise 

## 2016-11-21 NOTE — Telephone Encounter (Signed)
Can you please find out who decreased her Lipitor? From what I can see it should still be 20 mg daily. Thanks.

## 2016-11-21 NOTE — Patient Instructions (Signed)
Medication Instructions:   No medication changes made  Drink more water or tea  Labwork:  No new labs needed  Testing/Procedures:  No further testing at this time   I recommend watching educational videos on topics of interest to you at:       [email or web address].com  Enter code: HEARTCARE    Follow-Up: It was a pleasure seeing you in the office today. Please call us if you have new issues that need to be addressed before your next appt.  [phone]  Your physician wants you to follow-up in: 6 months.  You will receive a reminder letter in the mail two months in advance. If you don't receive a letter, please call our office to schedule the follow-up appointment.  If you need a refill on your cardiac medications before your next appointment, please call your pharmacy.

## 2016-11-22 NOTE — Telephone Encounter (Signed)
Noted. Should be taking 20 mg.

## 2016-11-22 NOTE — Telephone Encounter (Signed)
Linda Hart from [facility] states she has been taking 10mg  for the last month, informed her that the dose is 20 mg and it was not decreased

## 2016-11-26 ENCOUNTER — Ambulatory Visit (INDEPENDENT_AMBULATORY_CARE_PROVIDER_SITE_OTHER): Payer: Medicare Other

## 2016-11-26 VITALS — BP 126/66 | HR 78 | Temp 97.9°F | Resp 14 | Ht 61.0 in | Wt 98.1 lb

## 2016-11-26 DIAGNOSIS — Z0000 Encounter for general adult medical examination without abnormal findings: Secondary | ICD-10-CM

## 2016-11-26 NOTE — Patient Instructions (Addendum)
  Linda Hart , Thank you for taking time to come for your Medicare Wellness Visit. I appreciate your ongoing commitment to your health goals. Please review the following plan we discussed and let me know if I can assist you in the future.   Follow up with Dr. Caryl Bis as needed.  These are the goals we discussed: Goals    . Increase water intake          Stay hydrated and drink plenty of fluids/water.  Drink at least 3 cups per day.       This is a list of the screening recommended for you and due dates:  Health Maintenance  Topic Date Due  . Tetanus Vaccine  09/15/1944  . Shingles Vaccine  09/16/1987  . Pneumonia vaccines (2 of 2 - PPSV23) 08/11/2015  . Flu Shot  01/26/2017*  . DEXA scan (bone density measurement)  Completed  *Topic was postponed. The date shown is not the original due date.

## 2016-11-26 NOTE — Progress Notes (Signed)
Subjective:   Linda Hart is a 83 y.o. female who presents for an Initial Medicare Annual Wellness Visit.  Review of Systems    No ROS.  Medicare Wellness Visit.  Cardiac Risk Factors include: advanced age (>55men, >54 women);hypertension     Objective:    Today's Vitals   11/26/16 1304  BP: 126/66  Pulse: 78  Resp: 14  Temp: 97.9 F (36.6 C)  TempSrc: Oral  SpO2: 97%  Weight: 98 lb 1.9 oz (44.5 kg)  Height: 5\' 1"  (1.549 m)   Body mass index is 18.54 kg/m.   Current Medications (verified) Outpatient Encounter Prescriptions as of 11/26/2016  Medication Sig  . aspirin 81 MG tablet Take 81 mg by mouth daily.    Marland Kitchen atorvastatin (LIPITOR) 20 MG tablet Take 1 tablet (20 mg total) by mouth at bedtime.  . carvedilol (COREG) 3.125 MG tablet TAKE ONE TABLET TWICE A DAY WITH MEALS.  . cyanocobalamin (,VITAMIN B-12,) 1000 MCG/ML injection Inject 1 mL (1,000 mcg total) into the muscle every 30 (thirty) days.  . ferrous sulfate (KP FERROUS SULFATE) 325 (65 FE) MG tablet Take 325 mg by mouth 2 (two) times daily with a meal.  . loratadine (CLARITIN) 10 MG tablet Take 1 tablet (10 mg total) by mouth daily.  . ticagrelor (BRILINTA) 60 MG TABS tablet Take 1 tablet (60 mg total) by mouth 2 (two) times daily.  . vitamin C (ASCORBIC ACID) 500 MG tablet Take 500 mg by mouth 2 (two) times daily.  Marland Kitchen levalbuterol (XOPENEX HFA) 45 MCG/ACT inhaler Inhale 1-2 puffs into the lungs every 4 (four) hours as needed for wheezing. Reported on 12/16/2015  . nitroGLYCERIN (NITROSTAT) 0.4 MG SL tablet Place 0.4 mg under the tongue every 5 (five) minutes as needed for chest pain.   No facility-administered encounter medications on file as of 11/26/2016.     Allergies (verified) Penicillins   History: Past Medical History:  Diagnosis Date  . Coronary artery disease 03/06/13   90% prox LAD stenosis s/p DES  . Dizziness   . GERD (gastroesophageal reflux disease)   . Hypotension   . Ischemic  cardiomyopathy 03/09/13   s/p NSTEMI. EF 30-35%.  . MI (myocardial infarction)   . PAF (paroxysmal atrial fibrillation) ([place]) 03/06/13   In setting of NSTEMI and reperfusion, no recurrence  . SVT (supraventricular tachycardia) (HCC)    Self-limited on outpatient cardiac monitor   Past Surgical History:  Procedure Laterality Date  . CESAREAN SECTION    . CORONARY ANGIOPLASTY WITH STENT PLACEMENT  02/2013   DES-mid LAD, mild LCx, RCA stenoses   Family History  Problem Relation Age of Onset  . Heart failure Mother   . Heart failure Father   . Heart attack Father    Social History   Occupational History  . Not on file.   Social History Main Topics  . Smoking status: Never Smoker  . Smokeless tobacco: Never Used  . Alcohol use No     Comment: occasional  . Drug use: No  . Sexual activity: No    Tobacco Counseling Counseling given: Not Answered   Activities of Daily Living In your present state of health, do you have any difficulty performing the following activities: 11/26/2016  Hearing? Y  Vision? N  Difficulty concentrating or making decisions? Y  Walking or climbing stairs? Y  Dressing or bathing? N  Doing errands, shopping? Y  Preparing Food and eating ? Y  Using the Toilet? N  In the  past six months, have you accidently leaked urine? N  Do you have problems with loss of bowel control? N  Managing your Medications? Y  Managing your Finances? Y  Housekeeping or managing your Housekeeping? Y  Some recent data might be hidden    Immunizations and Health Maintenance Immunization History  Administered Date(s) Administered  . Influenza-Unspecified 07/30/2014  . Pneumococcal Conjugate-13 08/10/2014   Health Maintenance Due  Topic Date Due  . TETANUS/TDAP  09/15/1944  . ZOSTAVAX  09/16/1987  . PNA vac Low Risk Adult (2 of 2 - PPSV23) 08/11/2015    Patient Care Team: Leone Haven, MD as PCP - General (Family Medicine) Minna Merritts, MD as Consulting  Physician (Cardiology)  Indicate any recent Medical Services you may have received from other than [facility] providers in the past year (date may be approximate).     Assessment:   This is a routine wellness examination for [place]. The goal of the wellness visit is to assist the patient how to close the gaps in care and create a preventative care plan for the patient.   Osteoporosis risk reviewed.  Medications reviewed; taking without issues or barriers.  Safety issues reviewed; life alert and smoke detectors in the home. No firearms in the home. Wears seatbelts when riding with others. No violence in the home.  No identified risk were noted; The patient was oriented x 3; appropriate in dress and manner and no objective failures at ADL's or IADL's.   BMI; discussed the importance of a healthy diet, water intake and exercise. Educational material provided.  HTN; followed by PCP  Pneumococcal 23, TDAP and Zostavax vaccine deferred per patient request. Educational material provided.  Patient Concerns: None at this time. Follow up with PCP as needed.  Hearing/Vision screen Hearing Screening Comments: Difficulty hearing a whisper Audiologic testing deferred in the last year per patient preference Vision Screening Comments: Wears glasses daily Visual acuity not assessed per patient preference since she has regular follow up with her ophthalmologist  Dietary issues and exercise activities discussed: Current Exercise Habits: Home exercise routine, Type of exercise: walking, Frequency (Times/Week): 5, Intensity: Mild  Goals    . Increase water intake          Stay hydrated and drink plenty of fluids/water.  Drink at least 3 cups per day.      Depression Screen PHQ 2/9 Scores 11/26/2016  PHQ - 2 Score 0    Fall Risk Fall Risk  11/26/2016  Falls in the past year? No    Cognitive Function:     6CIT Screen 11/26/2016  What Year? 0 points  What month? 0 points  What time?  0 points  Count back from 20 0 points  Months in reverse 0 points    Screening Tests Health Maintenance  Topic Date Due  . TETANUS/TDAP  09/15/1944  . ZOSTAVAX  09/16/1987  . PNA vac Low Risk Adult (2 of 2 - PPSV23) 08/11/2015  . INFLUENZA VACCINE  01/26/2017 (Originally 05/29/2016)  . DEXA SCAN  Completed      Plan:    End of life planning; Advance aging; Advanced directives discussed. Copy of current HCPOA/Living Will requested.  Medicare Attestation I have personally reviewed: The patient's medical and social history Their use of alcohol, tobacco or illicit drugs Their current medications and supplements The patient's functional ability including ADLs,fall risks, home safety risks, cognitive, and hearing and visual impairment Diet and physical activities Evidence for depression   The patient's weight,  height, BMI, and visual acuity have been recorded in the chart.  I have made referrals and provided education to the patient based on review of the above and I have provided the patient with a written personalized care plan for preventive services.    During the course of the visit, [place] was educated and counseled about the following appropriate screening and preventive services:   Vaccines to include Pneumoccal, Influenza, Hepatitis B, Td, Zostavax, HCV  Electrocardiogram  Cardiovascular disease screening  Colorectal cancer screening  Bone density screening  Diabetes screening  Glaucoma screening  Mammography/PAP  Nutrition counseling  Smoking cessation counseling  Patient Instructions (the written plan) were given to the patient.    Varney Biles, LPN   [phone]

## 2016-11-27 NOTE — Progress Notes (Signed)
I have reviewed the above note and agree.  Abednego Yeates, M.D.  

## 2017-02-01 ENCOUNTER — Ambulatory Visit (INDEPENDENT_AMBULATORY_CARE_PROVIDER_SITE_OTHER): Payer: Medicare Other | Admitting: Family Medicine

## 2017-02-01 ENCOUNTER — Encounter: Payer: Self-pay | Admitting: Family Medicine

## 2017-02-01 VITALS — BP 160/72 | HR 67 | Temp 97.8°F | Wt 100.8 lb

## 2017-02-01 DIAGNOSIS — I255 Ischemic cardiomyopathy: Secondary | ICD-10-CM | POA: Diagnosis not present

## 2017-02-01 DIAGNOSIS — I10 Essential (primary) hypertension: Secondary | ICD-10-CM | POA: Diagnosis not present

## 2017-02-01 DIAGNOSIS — D649 Anemia, unspecified: Secondary | ICD-10-CM | POA: Diagnosis not present

## 2017-02-01 DIAGNOSIS — I480 Paroxysmal atrial fibrillation: Secondary | ICD-10-CM | POA: Diagnosis not present

## 2017-02-01 LAB — CBC
HCT: 38.8 % (ref 35.0–45.0)
Hemoglobin: 12.8 g/dL (ref 11.7–15.5)
MCH: 30.3 pg (ref 27.0–33.0)
MCHC: 33 g/dL (ref 32.0–36.0)
MCV: 91.7 fL (ref 80.0–100.0)
MPV: 9 fL (ref 7.5–12.5)
PLATELETS: 285 10*3/uL (ref 140–400)
RBC: 4.23 MIL/uL (ref 3.80–5.10)
RDW: 14.6 % (ref 11.0–15.0)
WBC: 10.3 10*3/uL (ref 3.8–10.8)

## 2017-02-01 NOTE — Patient Instructions (Addendum)
Nice to see you. We will check her blood counts and contact you with the results. Please continue your current blood pressure medication. We will have you return in a couple of weeks for blood pressure check.

## 2017-02-01 NOTE — Assessment & Plan Note (Signed)
Repeat CBC today 

## 2017-02-01 NOTE — Assessment & Plan Note (Addendum)
Not well-controlled today. Slightly above goal for age. Has been well controlled previously. We won't make any changes today though we will have her have her blood pressure checked at her living facility sometime next week and over the next 2 weeks by the nurse there and then they will contact us.

## 2017-02-01 NOTE — Progress Notes (Signed)
Pre visit review using our clinic review tool, if applicable. No additional management support is needed unless otherwise documented below in the visit note. 

## 2017-02-01 NOTE — Progress Notes (Signed)
  Tommi Rumps, MD Phone: [phone]  Linda Hart is a 83 y.o. female who presents today for f/u.  HYPERTENSION  Disease Monitoring  Home BP Monitoring not checking Chest pain- no    Dyspnea- no Medications  Compliance-  Taking coreg. Lightheadedness-  no  Edema- no  Paroxysmal A. fib: She reports no palpitations. She has not been on blood thinners. She's followed by cardiology for this.  Anemia: She reports no bleeding from anywhere. No lightheadedness. No fatigue. She is taking B12 injections. She believes she might be on an iron supplement though she is unsure.  PMH: nonsmoker.   ROS see history of present illness  Objective  Physical Exam Vitals:   02/01/17 1520  BP: (!) 160/72  Pulse: 67  Temp: 97.8 F (36.6 C)    BP Readings from Last 3 Encounters:  02/01/17 (!) 160/72  11/26/16 126/66  11/21/16 138/64   Wt Readings from Last 3 Encounters:  02/01/17 100 lb 12.8 oz (45.7 kg)  11/26/16 98 lb 1.9 oz (44.5 kg)  11/21/16 98 lb 8 oz (44.7 kg)    Physical Exam  Constitutional: No distress.  Cardiovascular: Normal rate, regular rhythm and normal heart sounds.   Pulmonary/Chest: Effort normal and breath sounds normal.  Musculoskeletal: She exhibits no edema.  Neurological: She is alert. Gait normal.  Skin: Skin is warm and dry. She is not diaphoretic.     Assessment/Plan: Please see individual problem list.  Anemia Repeat CBC today.  PAF (paroxysmal atrial fibrillation) (HCC) No episodes of A. fib. Not on any anticoagulation. Followed by cardiology.  Hypertension Not well-controlled today. Slightly above goal for age. Has been well controlled previously. We won't make any changes today though we will have her have her blood pressure checked at her living facility sometime next week and over the next 2 weeks by the nurse there and then they will contact us.   Orders Placed This Encounter  Procedures  . CBC    Tommi Rumps, MD [place]

## 2017-02-01 NOTE — Assessment & Plan Note (Signed)
No episodes of A. fib. Not on any anticoagulation. Followed by cardiology.

## 2017-02-06 ENCOUNTER — Encounter: Payer: Self-pay | Admitting: *Deleted

## 2017-02-22 ENCOUNTER — Other Ambulatory Visit: Payer: Self-pay | Admitting: Family Medicine

## 2017-02-22 NOTE — Telephone Encounter (Signed)
Last OV 02/01/17 last filled by Dr.Walker 01/23/16 60 11 rf

## 2017-03-18 ENCOUNTER — Other Ambulatory Visit: Payer: Self-pay | Admitting: Family Medicine

## 2017-03-29 ENCOUNTER — Other Ambulatory Visit: Payer: Self-pay | Admitting: Cardiovascular Disease

## 2017-04-05 ENCOUNTER — Encounter: Payer: Self-pay | Admitting: Family Medicine

## 2017-04-05 ENCOUNTER — Ambulatory Visit (INDEPENDENT_AMBULATORY_CARE_PROVIDER_SITE_OTHER): Payer: Medicare Other | Admitting: Family Medicine

## 2017-04-05 DIAGNOSIS — L932 Other local lupus erythematosus: Secondary | ICD-10-CM

## 2017-04-05 DIAGNOSIS — I255 Ischemic cardiomyopathy: Secondary | ICD-10-CM

## 2017-04-05 DIAGNOSIS — Z7189 Other specified counseling: Secondary | ICD-10-CM | POA: Diagnosis not present

## 2017-04-05 DIAGNOSIS — I10 Essential (primary) hypertension: Secondary | ICD-10-CM

## 2017-04-05 DIAGNOSIS — R0981 Nasal congestion: Secondary | ICD-10-CM

## 2017-04-05 NOTE — Assessment & Plan Note (Signed)
Patient with intermittent nasal congestion. She would like to monitor for now. If worsen she can let us know.

## 2017-04-05 NOTE — Assessment & Plan Note (Signed)
Has been stable. She'll continue to monitor. Could consider dermatology referral if needed.

## 2017-04-05 NOTE — Assessment & Plan Note (Signed)
Discussion regarding DO NOT RESUSCITATE was had today. This was brought up by the patient and her family member. Patient wants to be DO NOT RESUSCITATE. Forms were signed and provided to give to her living facility.

## 2017-04-05 NOTE — Progress Notes (Signed)
  Tommi Rumps, MD Phone: [phone]  Linda Hart is a 83 y.o. female who presents today for follow-up.  Patient presents to discuss DO NOT RESUSCITATE. Discussed what this means. Asked if she would like CPR or life saving measures if she were to need them or if she would like intubation. She stated no.  Hypertension: Taking carvedilol. Does not check at home. No chest pain or shortness of breath, or edema.  Patient notes occasionally her nose will feel congested. She pulls on the skin surrounding her nose and then it opens up and she has no other issues rest the day. Does blow her nose during the morning. Occasional rhinorrhea. No sneezing or postnasal drip.  Patient has a history of cutaneous lupus. She lost all her hair related to this. They tried her on prednisone though she did not tolerate. As long as she doesn't go out in the sun she doesn't have an issue.  PMH: nonsmoker.   ROS see history of present illness  Objective  Physical Exam Vitals:   04/05/17 1107  BP: 138/70  Pulse: 81  Temp: 97.7 F (36.5 C)    BP Readings from Last 3 Encounters:  04/05/17 138/70  02/01/17 (!) 160/72  11/26/16 126/66   Wt Readings from Last 3 Encounters:  04/05/17 96 lb 6.4 oz (43.7 kg)  02/01/17 100 lb 12.8 oz (45.7 kg)  11/26/16 98 lb 1.9 oz (44.5 kg)    Physical Exam  Constitutional: No distress.  Cardiovascular: Normal rate, regular rhythm and normal heart sounds.   Pulmonary/Chest: Effort normal and breath sounds normal.  Musculoskeletal: She exhibits no edema.  Neurological: She is alert.  Skin: Skin is warm and dry. She is not diaphoretic.     Assessment/Plan: Please see individual problem list.  Hypertension At goal for age. Continue carvedilol.  Cutaneous lupus erythematosus Has been stable. She'll continue to monitor. Could consider dermatology referral if needed.  Nasal congestion Patient with intermittent nasal congestion. She would like to monitor for  now. If worsen she can let us know.  Do not resuscitate discussion Discussion regarding DO NOT RESUSCITATE was had today. This was brought up by the patient and her family member. Patient wants to be DO NOT RESUSCITATE. Forms were signed and provided to give to her living facility.   Tommi Rumps, MD [place]

## 2017-04-05 NOTE — Patient Instructions (Signed)
Nice to see you. Please monitor your nasal congestion. If it changes or worsens please let us know.

## 2017-04-05 NOTE — Assessment & Plan Note (Signed)
At goal for age. Continue carvedilol.

## 2017-05-19 NOTE — Progress Notes (Signed)
Cardiology Office Note  Date:  05/21/2017   ID:  Austin Pongratz, Alferd Apa 07/28/1925, MRN [id number]  PCP:  Leone Haven, MD   Chief Complaint  Patient presents with  . other    6 month follow up. Patient c/o SOB with excertion. Meds reviewed verbally with patient.     HPI:   Linda Hart is a very pleasant 83 year old woman with PMHx s/f CAD (NSTEMI 03/06/13 s/p DES-LAD),  PAF (in setting of NSTEMI and reperfusion), ischemic cardiomyopathy (EF 30-35% on 03/09/13 echo),  SVT (self-limited on OP monitor),  GERD, remote h/o lightheadedness and syncope who was re-admitted to [facility] with worsening DOE/SOB may 2014.   She lives at the [facility] She presents today for follow-up of her coronary artery disease  In follow-up today she reports that she feels well with no complaints Denies any chest pain concerning for angina Denies any tachycardia concerning for atrial fibrillation Daughter presents with her today Fall 2 x, kitchen in MAY, And getting gout of the bath tub the same week Walks with a cane no regular exercise program but stays active  She denies any significant chest pain on exertion, no shortness of breath  [place]   Weight continues to drop, 95 pounds, previously 98 pounds Does not drink much, poor appetite in general  Lab work reviewed with her in detail LDL 60,  CR 1.2 in 2017, creatinine was 1.4 up to 1.5 in 2016 CR 1.24 BUN 30  EKG on today's visit showing no sinus rhythm with rate 75 bpm, no significant ST or T-wave changes  Other past medical history She was admitted 03/05/13 with NSTEMI.  cardiac catheterization revealing tubular 90% prox extending to mid LAD stenosis s/p DES, mild atheresclerosis in LCx, RCA.  Post-cath echo showed EF 30-35%, severe anterior, septal and apical HK, mild TR/MR, mildly elevated EF described as reduced. DAPT- ASA/Brilinta x 12 months recommended.  Cr did bump  to 1.5-1.6 suspected to be secondary to contrast induced precluding the addition of ACEi/ARB. Limited repeat echo indicated LVEF 30-35%. She declined LifeVest.   After discharge from the hospital, creatinine was >2, Lasix was held. Creatinine in June 2014 improved to 1.5.  Previous event monitor showed frequent supraventricular ectopy, rare PVCs.  She had short runs of SVT, the longest was 11 beats. She had 46 short runs. Uncertain if she was symptomatic during these short runs of SVT  Echocardiogram may 2014 with ejection fraction 30-35%, mild to moderate pulmonary hypertension  PMH:   has a past medical history of Coronary artery disease (03/06/13); Dizziness; GERD (gastroesophageal reflux disease); Hypotension; Ischemic cardiomyopathy (03/09/13); MI (myocardial infarction) ([place]); PAF (paroxysmal atrial fibrillation) ([place]) (03/06/13); and SVT (supraventricular tachycardia) ([place]).  PSH:    Past Surgical History:  Procedure Laterality Date  . CESAREAN SECTION    . CORONARY ANGIOPLASTY WITH STENT PLACEMENT  02/2013   DES-mid LAD, mild LCx, RCA stenoses    Current Outpatient Prescriptions  Medication Sig Dispense Refill  . aspirin 81 MG tablet Take 81 mg by mouth daily.      Marland Kitchen atorvastatin (LIPITOR) 20 MG tablet Take 1 tablet (20 mg total) by mouth at bedtime. 90 tablet 1  . BRILINTA 60 MG TABS tablet TAKE 1 TABLET BY MOUTH TWO TIMES DAILY 60 tablet 2  . carvedilol (COREG) 3.125 MG tablet TAKE ONE TABLET TWICE A DAY WITH MEALS. 60 tablet 11  . cyanocobalamin (,VITAMIN B-12,) 1000 MCG/ML  injection INJECT 1ML INTO THE MUCSLE EVERY 30 DAYS 1 mL 6  . ferrous sulfate (KP FERROUS SULFATE) 325 (65 FE) MG tablet Take 325 mg by mouth 2 (two) times daily with a meal.    . levalbuterol (XOPENEX HFA) 45 MCG/ACT inhaler Inhale 1-2 puffs into the lungs every 4 (four) hours as needed for wheezing. Reported on 12/16/2015    . loratadine (CLARITIN) 10 MG tablet Take 1 tablet (10 mg total) by mouth daily.  30 tablet 11  . nitroGLYCERIN (NITROSTAT) 0.4 MG SL tablet Place 0.4 mg under the tongue every 5 (five) minutes as needed for chest pain.     No current facility-administered medications for this visit.      Allergies:   Penicillins   Social History:  The patient  reports that she has never smoked. She has never used smokeless tobacco. She reports that she does not drink alcohol or use drugs.   Family History:   family history includes Heart attack in her father; Heart failure in her father and mother.    Review of Systems: Review of Systems  Constitutional: Negative.   Respiratory: Negative.   Cardiovascular: Negative.   Gastrointestinal: Negative.   Musculoskeletal: Positive for falls.       Leg weakness  Neurological: Negative.   Psychiatric/Behavioral: Negative.   All other systems reviewed and are negative.    PHYSICAL EXAM: VS:  BP 130/66 (BP Location: Left Arm, Patient Position: Sitting, Cuff Size: Normal)   Pulse 75   Ht 4\' 11"  (1.499 m)   Wt 95 lb (43.1 kg)   BMI 19.19 kg/m  , BMI Body mass index is 19.19 kg/m.  GEN:  Thin, in no acute distress  HEENT: normal  Neck: no JVD, carotid bruits, or masses Cardiac: RRR; no murmurs, rubs, or gallops,no edema  Respiratory:  clear to auscultation bilaterally, normal work of breathing GI: soft, nontender, nondistended, + BS MS: no deformity or atrophy  Skin: warm and dry, no rash Neuro:  Strength and sensation are intact Psych: euthymic mood, full affect    Recent Labs: 08/23/2016: ALT 22; BUN 30; Creatinine, Ser 1.24; Potassium 4.8; Sodium 137 02/01/2017: Hemoglobin 12.8; Platelets 285    Lipid Panel Lab Results  Component Value Date   CHOL 120 09/19/2015   HDL 59.60 09/19/2015   LDLCALC 48 09/19/2015   TRIG 60.0 09/19/2015      Wt Readings from Last 3 Encounters:  05/21/17 95 lb (43.1 kg)  04/05/17 96 lb 6.4 oz (43.7 kg)  02/01/17 100 lb 12.8 oz (45.7 kg)       ASSESSMENT AND  PLAN:   Paroxysmal atrial fibrillation (HCC) - Plan: EKG 12-Lead Maintaining normal sinus rhythm, currently not on anticoagulation Discussed with family, recent falls , prior trauma to her legs  She denies any symptoms concerning for arrhythmia  Essential hypertension Blood pressure is well controlled on today's visit. No changes made to the medications.  Cardiomyopathy, ischemic Previous echocardiogram several years ago ejection fraction 40-45% In November 2016   Mixed hyperlipidemia Cholesterol is at goal on the current lipid regimen. No changes to the medications were made.  CAD in native artery Currently with no symptoms of angina, exercising on a daily basis Discussed holding the brilinta.  she prefers to continue on her current medications. She is aware of risk of bleeding   Systolic and diastolic CHF, acute on chronic (HCC) Appears relatively euvolemic  No changes to her medications. She does not require diuresis at this time  Lasix sent in to take as needed. In general she is prerenal   Anorexia Encouraged her to increase her calorie intake 3 pound weight loss since her last clinic visit  Does not have an appetite, chronic issue   Total encounter time more than 25 minutes  Greater than 50% was spent in counseling and coordination of care with the patient   Disposition:   F/U  6 months   Orders Placed This Encounter  Procedures  . EKG 12-Lead     Signed, Esmond Plants, M.D., Ph.D. 05/21/2017  [place], [place]

## 2017-05-21 ENCOUNTER — Ambulatory Visit (INDEPENDENT_AMBULATORY_CARE_PROVIDER_SITE_OTHER): Payer: Medicare Other | Admitting: Cardiovascular Disease

## 2017-05-21 ENCOUNTER — Encounter: Payer: Self-pay | Admitting: Cardiovascular Disease

## 2017-05-21 VITALS — BP 130/66 | HR 75 | Ht 59.0 in | Wt 95.0 lb

## 2017-05-21 DIAGNOSIS — I480 Paroxysmal atrial fibrillation: Secondary | ICD-10-CM

## 2017-05-21 DIAGNOSIS — I5043 Acute on chronic combined systolic (congestive) and diastolic (congestive) heart failure: Secondary | ICD-10-CM

## 2017-05-21 DIAGNOSIS — I2510 Atherosclerotic heart disease of native coronary artery without angina pectoris: Secondary | ICD-10-CM | POA: Diagnosis not present

## 2017-05-21 DIAGNOSIS — I10 Essential (primary) hypertension: Secondary | ICD-10-CM | POA: Diagnosis not present

## 2017-05-21 DIAGNOSIS — I255 Ischemic cardiomyopathy: Secondary | ICD-10-CM | POA: Diagnosis not present

## 2017-05-21 MED ORDER — FUROSEMIDE 20 MG PO TABS: 20.0000 mg | ORAL_TABLET | Freq: Every day | ORAL | 3 refills | Status: DC | PRN

## 2017-05-21 NOTE — Patient Instructions (Signed)

## 2017-05-27 ENCOUNTER — Other Ambulatory Visit: Payer: Self-pay | Admitting: Family Medicine

## 2017-06-21 ENCOUNTER — Other Ambulatory Visit: Payer: Self-pay | Admitting: Cardiovascular Disease

## 2017-07-23 ENCOUNTER — Other Ambulatory Visit: Payer: Self-pay | Admitting: Cardiovascular Disease

## 2017-07-23 DIAGNOSIS — Z23 Encounter for immunization: Secondary | ICD-10-CM | POA: Diagnosis not present

## 2017-08-17 ENCOUNTER — Inpatient Hospital Stay: Payer: Medicare Other

## 2017-08-17 ENCOUNTER — Emergency Department: Payer: Medicare Other

## 2017-08-17 ENCOUNTER — Encounter: Payer: Self-pay | Admitting: Anesthesiology

## 2017-08-17 ENCOUNTER — Inpatient Hospital Stay
Admission: EM | Admit: 2017-08-17 | Discharge: 2017-08-22 | DRG: 481 | Disposition: A | Discharge status: Skilled Nursing Facility | Payer: Medicare Other | Attending: Internal Medicine | Admitting: Internal Medicine

## 2017-08-17 DIAGNOSIS — I5022 Chronic systolic (congestive) heart failure: Secondary | ICD-10-CM | POA: Diagnosis present

## 2017-08-17 DIAGNOSIS — Z8249 Family history of ischemic heart disease and other diseases of the circulatory system: Secondary | ICD-10-CM

## 2017-08-17 DIAGNOSIS — Z7982 Long term (current) use of aspirin: Secondary | ICD-10-CM | POA: Diagnosis not present

## 2017-08-17 DIAGNOSIS — I252 Old myocardial infarction: Secondary | ICD-10-CM

## 2017-08-17 DIAGNOSIS — D638 Anemia in other chronic diseases classified elsewhere: Secondary | ICD-10-CM | POA: Diagnosis present

## 2017-08-17 DIAGNOSIS — K219 Gastro-esophageal reflux disease without esophagitis: Secondary | ICD-10-CM | POA: Diagnosis present

## 2017-08-17 DIAGNOSIS — I5042 Chronic combined systolic (congestive) and diastolic (congestive) heart failure: Secondary | ICD-10-CM | POA: Diagnosis not present

## 2017-08-17 DIAGNOSIS — Z01810 Encounter for preprocedural cardiovascular examination: Secondary | ICD-10-CM | POA: Diagnosis not present

## 2017-08-17 DIAGNOSIS — M25551 Pain in right hip: Secondary | ICD-10-CM | POA: Diagnosis not present

## 2017-08-17 DIAGNOSIS — I2510 Atherosclerotic heart disease of native coronary artery without angina pectoris: Secondary | ICD-10-CM | POA: Diagnosis present

## 2017-08-17 DIAGNOSIS — I255 Ischemic cardiomyopathy: Secondary | ICD-10-CM | POA: Diagnosis present

## 2017-08-17 DIAGNOSIS — R05 Cough: Secondary | ICD-10-CM

## 2017-08-17 DIAGNOSIS — D62 Acute posthemorrhagic anemia: Secondary | ICD-10-CM | POA: Diagnosis not present

## 2017-08-17 DIAGNOSIS — N179 Acute kidney failure, unspecified: Secondary | ICD-10-CM | POA: Diagnosis present

## 2017-08-17 DIAGNOSIS — K567 Ileus, unspecified: Secondary | ICD-10-CM | POA: Diagnosis not present

## 2017-08-17 DIAGNOSIS — Y92129 Unspecified place in nursing home as the place of occurrence of the external cause: Secondary | ICD-10-CM

## 2017-08-17 DIAGNOSIS — S79911A Unspecified injury of right hip, initial encounter: Secondary | ICD-10-CM | POA: Diagnosis not present

## 2017-08-17 DIAGNOSIS — Z9181 History of falling: Secondary | ICD-10-CM | POA: Diagnosis not present

## 2017-08-17 DIAGNOSIS — Y9301 Activity, walking, marching and hiking: Secondary | ICD-10-CM | POA: Diagnosis present

## 2017-08-17 DIAGNOSIS — Z880 Allergy status to penicillin: Secondary | ICD-10-CM

## 2017-08-17 DIAGNOSIS — Z66 Do not resuscitate: Secondary | ICD-10-CM | POA: Diagnosis present

## 2017-08-17 DIAGNOSIS — M25559 Pain in unspecified hip: Secondary | ICD-10-CM | POA: Diagnosis not present

## 2017-08-17 DIAGNOSIS — D509 Iron deficiency anemia, unspecified: Secondary | ICD-10-CM | POA: Diagnosis present

## 2017-08-17 DIAGNOSIS — E785 Hyperlipidemia, unspecified: Secondary | ICD-10-CM | POA: Diagnosis present

## 2017-08-17 DIAGNOSIS — N3000 Acute cystitis without hematuria: Secondary | ICD-10-CM | POA: Diagnosis present

## 2017-08-17 DIAGNOSIS — R51 Headache: Secondary | ICD-10-CM | POA: Diagnosis not present

## 2017-08-17 DIAGNOSIS — Z419 Encounter for procedure for purposes other than remedying health state, unspecified: Secondary | ICD-10-CM

## 2017-08-17 DIAGNOSIS — Z7401 Bed confinement status: Secondary | ICD-10-CM | POA: Diagnosis not present

## 2017-08-17 DIAGNOSIS — I509 Heart failure, unspecified: Secondary | ICD-10-CM | POA: Diagnosis not present

## 2017-08-17 DIAGNOSIS — S72001A Fracture of unspecified part of neck of right femur, initial encounter for closed fracture: Secondary | ICD-10-CM | POA: Diagnosis present

## 2017-08-17 DIAGNOSIS — D649 Anemia, unspecified: Secondary | ICD-10-CM | POA: Diagnosis not present

## 2017-08-17 DIAGNOSIS — M6281 Muscle weakness (generalized): Secondary | ICD-10-CM | POA: Diagnosis not present

## 2017-08-17 DIAGNOSIS — J40 Bronchitis, not specified as acute or chronic: Secondary | ICD-10-CM | POA: Diagnosis not present

## 2017-08-17 DIAGNOSIS — Z79899 Other long term (current) drug therapy: Secondary | ICD-10-CM | POA: Diagnosis not present

## 2017-08-17 DIAGNOSIS — R059 Cough, unspecified: Secondary | ICD-10-CM

## 2017-08-17 DIAGNOSIS — I480 Paroxysmal atrial fibrillation: Secondary | ICD-10-CM | POA: Diagnosis present

## 2017-08-17 DIAGNOSIS — N3090 Cystitis, unspecified without hematuria: Secondary | ICD-10-CM | POA: Diagnosis not present

## 2017-08-17 DIAGNOSIS — Z01818 Encounter for other preprocedural examination: Secondary | ICD-10-CM | POA: Diagnosis not present

## 2017-08-17 DIAGNOSIS — M84459A Pathological fracture, hip, unspecified, initial encounter for fracture: Secondary | ICD-10-CM | POA: Diagnosis not present

## 2017-08-17 DIAGNOSIS — Z955 Presence of coronary angioplasty implant and graft: Secondary | ICD-10-CM | POA: Diagnosis not present

## 2017-08-17 DIAGNOSIS — S72141A Displaced intertrochanteric fracture of right femur, initial encounter for closed fracture: Principal | ICD-10-CM | POA: Diagnosis present

## 2017-08-17 DIAGNOSIS — S72141D Displaced intertrochanteric fracture of right femur, subsequent encounter for closed fracture with routine healing: Secondary | ICD-10-CM | POA: Diagnosis not present

## 2017-08-17 DIAGNOSIS — W010XXA Fall on same level from slipping, tripping and stumbling without subsequent striking against object, initial encounter: Secondary | ICD-10-CM | POA: Diagnosis present

## 2017-08-17 DIAGNOSIS — L89153 Pressure ulcer of sacral region, stage 3: Secondary | ICD-10-CM | POA: Diagnosis not present

## 2017-08-17 DIAGNOSIS — L932 Other local lupus erythematosus: Secondary | ICD-10-CM | POA: Diagnosis not present

## 2017-08-17 DIAGNOSIS — W19XXXA Unspecified fall, initial encounter: Secondary | ICD-10-CM

## 2017-08-17 DIAGNOSIS — J441 Chronic obstructive pulmonary disease with (acute) exacerbation: Secondary | ICD-10-CM | POA: Diagnosis not present

## 2017-08-17 DIAGNOSIS — I110 Hypertensive heart disease with heart failure: Secondary | ICD-10-CM | POA: Diagnosis present

## 2017-08-17 DIAGNOSIS — I10 Essential (primary) hypertension: Secondary | ICD-10-CM | POA: Diagnosis not present

## 2017-08-17 DIAGNOSIS — R2689 Other abnormalities of gait and mobility: Secondary | ICD-10-CM | POA: Diagnosis not present

## 2017-08-17 HISTORY — DX: Failed or difficult intubation, initial encounter: T88.4XXA

## 2017-08-17 LAB — APTT: APTT: 28 s (ref 24–36)

## 2017-08-17 LAB — BASIC METABOLIC PANEL
Anion gap: 9 (ref 5–15)
BUN: 27 mg/dL — ABNORMAL HIGH (ref 6–20)
CHLORIDE: 101 mmol/L (ref 101–111)
CO2: 22 mmol/L (ref 22–32)
Calcium: 8.9 mg/dL (ref 8.9–10.3)
Creatinine, Ser: 1.29 mg/dL — ABNORMAL HIGH (ref 0.44–1.00)
GFR calc non Af Amer: 36 mL/min — ABNORMAL LOW (ref >60)
GFR, EST AFRICAN AMERICAN: 41 mL/min — AB (ref >60)
Glucose, Bld: 112 mg/dL — ABNORMAL HIGH (ref 65–99)
POTASSIUM: 4.2 mmol/L (ref 3.5–5.1)
SODIUM: 132 mmol/L — AB (ref 135–145)

## 2017-08-17 LAB — PROTIME-INR
INR: 1.01
PROTHROMBIN TIME: 13.2 s (ref 11.4–15.2)

## 2017-08-17 LAB — CBC
HEMATOCRIT: 37.5 % (ref 35.0–47.0)
Hemoglobin: 12.8 g/dL (ref 12.0–16.0)
MCH: 31.4 pg (ref 26.0–34.0)
MCHC: 34.2 g/dL (ref 32.0–36.0)
MCV: 91.9 fL (ref 80.0–100.0)
Platelets: 273 10*3/uL (ref 150–440)
RBC: 4.08 MIL/uL (ref 3.80–5.20)
RDW: 14.4 % (ref 11.5–14.5)
WBC: 9.3 10*3/uL (ref 3.6–11.0)

## 2017-08-17 LAB — TYPE AND SCREEN
ABO/RH(D): O POS
ANTIBODY SCREEN: NEGATIVE

## 2017-08-17 MED ORDER — CARVEDILOL 3.125 MG PO TABS
3.1250 mg | ORAL_TABLET | Freq: Two times a day (BID) | ORAL | Status: DC
  Administered 2017-08-18 – 2017-08-22 (×9): 3.125 mg via ORAL
  Filled 2017-08-17 (×9): qty 1

## 2017-08-17 MED ORDER — FERROUS SULFATE 325 (65 FE) MG PO TABS
325.0000 mg | ORAL_TABLET | Freq: Two times a day (BID) | ORAL | Status: DC
  Administered 2017-08-19 – 2017-08-22 (×6): 325 mg via ORAL
  Filled 2017-08-17 (×7): qty 1

## 2017-08-17 MED ORDER — NITROGLYCERIN 0.4 MG SL SUBL: 0.4000 mg | SUBLINGUAL_TABLET | SUBLINGUAL | Status: DC | PRN

## 2017-08-17 MED ORDER — MORPHINE SULFATE (PF) 2 MG/ML IV SOLN
2.0000 mg | INTRAVENOUS | Status: DC | PRN
  Administered 2017-08-17: 2 mg via INTRAVENOUS
  Filled 2017-08-17: qty 1

## 2017-08-17 MED ORDER — ONDANSETRON HCL 4 MG PO TABS
4.0000 mg | ORAL_TABLET | Freq: Four times a day (QID) | ORAL | Status: DC | PRN
  Administered 2017-08-19: 4 mg via ORAL

## 2017-08-17 MED ORDER — ACETAMINOPHEN 325 MG PO TABS
650.0000 mg | ORAL_TABLET | Freq: Four times a day (QID) | ORAL | Status: DC | PRN
  Administered 2017-08-18 – 2017-08-19 (×2): 650 mg via ORAL
  Filled 2017-08-17 (×2): qty 2

## 2017-08-17 MED ORDER — ATORVASTATIN CALCIUM 20 MG PO TABS
20.0000 mg | ORAL_TABLET | Freq: Every day | ORAL | Status: DC
  Administered 2017-08-18 – 2017-08-21 (×4): 20 mg via ORAL
  Filled 2017-08-17 (×4): qty 1

## 2017-08-17 MED ORDER — ACETAMINOPHEN 650 MG RE SUPP: 650.0000 mg | Freq: Four times a day (QID) | RECTAL | Status: DC | PRN

## 2017-08-17 MED ORDER — MORPHINE SULFATE (PF) 2 MG/ML IV SOLN: 2.0000 mg | INTRAVENOUS | Status: DC | PRN

## 2017-08-17 MED ORDER — LORATADINE 10 MG PO TABS
10.0000 mg | ORAL_TABLET | Freq: Every day | ORAL | Status: DC
Start: 2017-08-18 — End: 2017-08-22
  Administered 2017-08-22: 10 mg via ORAL
  Filled 2017-08-17 (×4): qty 1

## 2017-08-17 MED ORDER — ONDANSETRON HCL 4 MG/2ML IJ SOLN: 4.0000 mg | Freq: Four times a day (QID) | INTRAMUSCULAR | Status: DC | PRN

## 2017-08-17 MED ORDER — SODIUM CHLORIDE 0.9 % IV BOLUS (SEPSIS)
500.0000 mL | Freq: Once | INTRAVENOUS | Status: AC
  Administered 2017-08-17: 500 mL via INTRAVENOUS

## 2017-08-17 NOTE — Progress Notes (Signed)
After discussion with the patient's daughter, she is requesting that Dr. Rudene Christians or Dr. Marry Guan perform the surgery. I explained that neither of them are on call this weekend and she would have to wait until Monday for surgery. She understands the potential risks of delaying surgery. We will schedule her mom for surgery on Monday.

## 2017-08-17 NOTE — H&P (Signed)
[facility] [place] NAME: Linda Hart    MR#:  [id number]  DATE OF BIRTH:  [birth year]  DATE OF ADMISSION:  08/17/2017  PRIMARY CARE PHYSICIAN: Leone Haven, MD   REQUESTING/REFERRING PHYSICIAN: Dr. London Sheer  CHIEF COMPLAINT:   Chief Complaint  Patient presents with  . Fall    HISTORY OF PRESENT ILLNESS:  Linda Hart  is a 83 y.o. female with a known history of coronary artery disease status post stents, GERD, ischemic cardiomyopathy ejection fraction of 30-35%, paroxysmal atrial fibrillation, history of SVT who presents to the hospital after a mechanical fall and noted to have a right hip fracture. Patient resides at the [facility] and was walking from the cafeteria back to her room when she lost her footing and fell to the ground. She was having pain on the right side of her leg and hip and therefore was brought to the ER for further evaluation.  Patient underwent x-rays of her pelvis and hip which was consistent with right sided trochanteric hip fracture. Hospitalist services were contacted for admission. Patient denies any prodromal symptoms prior to her fall like any chest pain, shortness of breath, palpitations, dizziness, nausea vomiting or any other associated symptoms.  PAST MEDICAL HISTORY:   Past Medical History:  Diagnosis Date  . Coronary artery disease 03/06/13   90% prox LAD stenosis s/p DES  . Dizziness   . GERD (gastroesophageal reflux disease)   . Hypotension   . Ischemic cardiomyopathy 03/09/13   s/p NSTEMI. EF 30-35%.  . MI (myocardial infarction) ([place])   . PAF (paroxysmal atrial fibrillation) ([place]) 03/06/13   In setting of NSTEMI and reperfusion, no recurrence  . SVT (supraventricular tachycardia) (HCC)    Self-limited on outpatient cardiac monitor    PAST SURGICAL HISTORY:   Past Surgical History:  Procedure Laterality Date  . CESAREAN SECTION    . CORONARY ANGIOPLASTY WITH STENT  PLACEMENT  02/2013   DES-mid LAD, mild LCx, RCA stenoses    SOCIAL HISTORY:   Social History  Substance Use Topics  . Smoking status: Never Smoker  . Smokeless tobacco: Never Used  . Alcohol use No     Comment: occasional    FAMILY HISTORY:   Family History  Problem Relation Age of Onset  . Heart failure Mother   . Heart failure Father   . Heart attack Father     DRUG ALLERGIES:   Allergies  Allergen Reactions  . Penicillins     Has patient had a PCN reaction causing immediate rash, facial/tongue/throat swelling, SOB or lightheadedness with hypotension: Unknown Has patient had a PCN reaction causing severe rash involving mucus membranes or skin necrosis: Unknown Has patient had a PCN reaction that required hospitalization No Has patient had a PCN reaction occurring within the last 10 years: No If all of the above answers are "NO", then may proceed with Cephalosporin use.    REVIEW OF SYSTEMS:   Review of Systems  Constitutional: Negative for chills and fever.  HENT: Negative for congestion (we'll consult orthopedics. Continue further care as per them.) and tinnitus.   Eyes: Negative for blurred vision and double vision.  Respiratory: Negative for cough, shortness of breath and wheezing.   Cardiovascular: Negative for chest pain, orthopnea and PND.  Gastrointestinal: Negative for abdominal pain, diarrhea, nausea and vomiting.  Genitourinary: Negative for dysuria and hematuria.  Musculoskeletal: Positive for falls and joint pain (Right HIp).  Neurological: Negative for dizziness,  sensory change and focal weakness.  All other systems reviewed and are negative.   MEDICATIONS AT HOME:   Prior to Admission medications   Medication Sig Start Date End Date Taking? Authorizing Provider  aspirin 81 MG tablet Take 81 mg by mouth daily.      [provider]  atorvastatin (LIPITOR) 20 MG tablet TAKE 1 TABLET BY MOUTH AT BEDTIME 05/27/17   Leone Haven, MD   BRILINTA 60 MG TABS tablet TAKE 1 TABLET BY MOUTH TWICE DAILY 07/23/17   Minna Merritts, MD  carvedilol (COREG) 3.125 MG tablet TAKE ONE TABLET TWICE A DAY WITH MEALS. 02/22/17   Leone Haven, MD  cyanocobalamin (,VITAMIN B-12,) 1000 MCG/ML injection INJECT 1ML INTO THE MUCSLE EVERY 30 DAYS 03/18/17   Leone Haven, MD  ferrous sulfate (KP FERROUS SULFATE) 325 (65 FE) MG tablet Take 325 mg by mouth 2 (two) times daily with a meal.    [provider]  furosemide (LASIX) 20 MG tablet Take 1 tablet (20 mg total) by mouth daily as needed. 05/21/17   Minna Merritts, MD  levalbuterol [facility] HFA) 45 MCG/ACT inhaler Inhale 1-2 puffs into the lungs every 4 (four) hours as needed for wheezing. Reported on 12/16/2015    [provider]  loratadine (CLARITIN) 10 MG tablet Take 1 tablet (10 mg total) by mouth daily. 01/04/14   Jackolyn Confer, MD  nitroGLYCERIN (NITROSTAT) 0.4 MG SL tablet Place 0.4 mg under the tongue every 5 (five) minutes as needed for chest pain.    [provider]      VITAL SIGNS:  Blood pressure (!) 159/82, pulse 83, resp. rate 20, height 5' (1.524 m), weight 43.5 kg (96 lb), SpO2 97 %.  PHYSICAL EXAMINATION:  Physical Exam  GENERAL:  83 y.o.-year-old patient lying in the bed in no acute distress.  EYES: Pupils equal, round, reactive to light and accommodation. No scleral icterus. Extraocular muscles intact.  HEENT: Head atraumatic, normocephalic. Oropharynx and nasopharynx clear. No oropharyngeal erythema, moist oral mucosa  NECK:  Supple, no jugular venous distention. No thyroid enlargement, no tenderness.  LUNGS: Normal breath sounds bilaterally, no wheezing, rales, rhonchi. No use of accessory muscles of respiration.  CARDIOVASCULAR: S1, S2 RRR. No murmurs, rubs, gallops, clicks.  ABDOMEN: Soft, nontender, nondistended. Bowel sounds present. No organomegaly or mass.  EXTREMITIES: No pedal edema, cyanosis, or clubbing. + 2 pedal &  radial pulses b/l.  Right lower extremity is externally rotated and shortened due to the hip fracture. NEUROLOGIC: Cranial nerves II through XII are intact. No focal Motor or sensory deficits appreciated b/l PSYCHIATRIC: The patient is alert and oriented x 3.  SKIN: No obvious rash, lesion, or ulcer.   LABORATORY PANEL:   CBC  Recent Labs Lab 08/17/17 1927  WBC 9.3  HGB 12.8  HCT 37.5  PLT 273   ------------------------------------------------------------------------------------------------------------------  Chemistries   Recent Labs Lab 08/17/17 1927  NA 132*  K 4.2  CL 101  CO2 22  GLUCOSE 112*  BUN 27*  CREATININE 1.29*  CALCIUM 8.9   ------------------------------------------------------------------------------------------------------------------  Cardiac Enzymes No results for input(s): TROPONINI in the last 168 hours. ------------------------------------------------------------------------------------------------------------------  RADIOLOGY:  Dg Chest Portable 1 View  Result Date: 08/17/2017 CLINICAL DATA:  Preoperative evaluation, known right femoral fracture EXAM: PORTABLE CHEST 1 VIEW COMPARISON:  09/25/2015 FINDINGS: Cardiac shadow is within normal limits. Mild aortic calcifications are seen. Hyperinflation of the lungs is noted. No acute infiltrate or sizable effusion is seen. No  bony abnormality is noted. IMPRESSION: COPD without acute abnormality. Electronically Signed   By: Inez Catalina M.D.   On: 08/17/2017 19:27   Dg Hip Unilat  With Pelvis 2-3 Views Right  Result Date: 08/17/2017 CLINICAL DATA:  Recent fall with right leg pain, initial encounter EXAM: DG HIP (WITH OR WITHOUT PELVIS) 2-3V RIGHT COMPARISON:  None. FINDINGS: Pelvic ring is intact. There is an intratrochanteric right femoral fracture with some impaction and angulation at the fracture site. No gross soft tissue abnormality is seen. No dislocation is noted. IMPRESSION: Right  intratrochanteric femoral fracture. Electronically Signed   By: Inez Catalina M.D.   On: 08/17/2017 19:25     IMPRESSION AND PLAN:   83 year old female with past medical history of coronary artery disease status post stent placement, paroxysmal atrial fibrillation, hypertension, ischemic cardiomyopathy ejection fraction of 30-35% who presents to the hospital after a mechanical fall and noted to have a right hip fracture.  1. Preoperative cardiovascular examination-patient is a low to moderate risk for noncardiac surgery. No acute contraindications to surgery at this time. EKG has been reviewed which shows no acute ST or T-wave changes. - Would continue perioperative beta blocker with carvedilol.  2. Status post fall and right hip fracture-we'll consult orthopedics, pain control with IV morphine. Continue further care as per orthopedics.  3. Hx of CAD s/p stent - no acute chest pain.  - hold ASA, Brillinta as pt. To have surgery tomorrow.  - cont. Coreg.   4. Hyperlipidemia - cont. Atorvastatin.   5. Hx of chronic systolic CHF - clinically not in CHF.   - hold Lasix for now. Cont. Coreg.     All the records are reviewed and case discussed with ED provider. Management plans discussed with the patient, family and they are in agreement.  CODE STATUS: Full code  TOTAL TIME TAKING CARE OF THIS PATIENT: 45 minutes.    Henreitta Leber M.D on 08/17/2017 at 8:27 PM  Between 7am to 6pm - Pager - [phone]  After 6pm go to [email or web address] - password [email or web address] [facility]  [facility]  Office  [phone]  CC: Primary care physician; Leone Haven, MD

## 2017-08-17 NOTE — Consult Note (Signed)
ORTHOPAEDIC CONSULTATION  REQUESTING PHYSICIAN: Henreitta Leber, MD  Chief Complaint: Right hip pain  HPI: Linda Hart is a 83 y.o. female who complains of  Right hip pain and inability to bear weight after a ground level fall. She denies any LOC.  Past Medical History:  Diagnosis Date  . Coronary artery disease 03/06/13   90% prox LAD stenosis s/p DES  . Dizziness   . GERD (gastroesophageal reflux disease)   . Hypotension   . Ischemic cardiomyopathy 03/09/13   s/p NSTEMI. EF 30-35%.  . MI (myocardial infarction) ([place])   . PAF (paroxysmal atrial fibrillation) ([place]) 03/06/13   In setting of NSTEMI and reperfusion, no recurrence  . SVT (supraventricular tachycardia) (HCC)    Self-limited on outpatient cardiac monitor   Past Surgical History:  Procedure Laterality Date  . CESAREAN SECTION    . CORONARY ANGIOPLASTY WITH STENT PLACEMENT  02/2013   DES-mid LAD, mild LCx, RCA stenoses   Social History   Social History  . Marital status: Widowed    Spouse name: N/A  . Number of children: N/A  . Years of education: N/A   Social History Main Topics  . Smoking status: Never Smoker  . Smokeless tobacco: Never Used  . Alcohol use No     Comment: occasional  . Drug use: No  . Sexual activity: No   Other Topics Concern  . 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 Does not regularly exercise.    Family History  Problem Relation Age of Onset  . Heart failure Mother   . Heart failure Father   . Heart attack Father    Allergies  Allergen Reactions  . Penicillins     Has patient had a PCN reaction causing immediate rash, facial/tongue/throat swelling, SOB or lightheadedness with hypotension: Unknown Has patient had a PCN reaction causing severe rash involving mucus membranes or skin necrosis: Unknown Has patient had a PCN reaction that required hospitalization No Has patient had a PCN reaction occurring within the last 10 years: No If all of the above answers  are "NO", then may proceed with Cephalosporin use.   Prior to Admission medications   Medication Sig Start Date End Date Taking? Authorizing Provider  aspirin 81 MG tablet Take 81 mg by mouth daily.      [provider]  atorvastatin (LIPITOR) 20 MG tablet TAKE 1 TABLET BY MOUTH AT BEDTIME 05/27/17   Leone Haven, MD  BRILINTA 60 MG TABS tablet TAKE 1 TABLET BY MOUTH TWICE DAILY 07/23/17   Minna Merritts, MD  carvedilol (COREG) 3.125 MG tablet TAKE ONE TABLET TWICE A DAY WITH MEALS. 02/22/17   Leone Haven, MD  cyanocobalamin (,VITAMIN B-12,) 1000 MCG/ML injection INJECT 1ML INTO THE MUCSLE EVERY 30 DAYS 03/18/17   Leone Haven, MD  ferrous sulfate (KP FERROUS SULFATE) 325 (65 FE) MG tablet Take 325 mg by mouth 2 (two) times daily with a meal.    [provider]  furosemide (LASIX) 20 MG tablet Take 1 tablet (20 mg total) by mouth daily as needed. 05/21/17   Minna Merritts, MD  levalbuterol [facility] HFA) 45 MCG/ACT inhaler Inhale 1-2 puffs into the lungs every 4 (four) hours as needed for wheezing. Reported on 12/16/2015    [provider]  loratadine (CLARITIN) 10 MG tablet Take 1 tablet (10 mg total) by mouth daily. 01/04/14   Jackolyn Confer, MD  nitroGLYCERIN (NITROSTAT) 0.4 MG SL tablet Place  0.4 mg under the tongue every 5 (five) minutes as needed for chest pain.    [provider]   Ct Head Wo Contrast  Result Date: 08/17/2017 CLINICAL DATA:  Patient does not pick up her feet when she walks. Tripped over the rock. Right hip pain. Right hip fracture. EXAM: CT HEAD WITHOUT CONTRAST TECHNIQUE: Contiguous axial images were obtained from the base of the skull through the vertex without intravenous contrast. COMPARISON:  12/05/2011 FINDINGS: Brain: Diffuse cerebral atrophy. Mild ventricular dilatation consistent with central atrophy. Low-attenuation changes in the deep white matter consistent with small vessel ischemia. Basal ganglia  calcifications. No mass effect or midline shift. No abnormal extra-axial fluid collections. Gray-white matter junctions are distinct. Basal cisterns are not effaced. No acute intracranial hemorrhage. Vascular: Vascular calcifications are present in the internal carotid and vertebrobasilar vessels. Skull: Calvarium appears intact. No acute depressed skull fractures. Focal lucency in the left anterior frontal region is unchanged since previous study and probably represents a hemangioma. Sinuses/Orbits: Acute appearing nasal bone fractures. Paranasal sinuses are clear. Mastoid air cells are clear. Other: None. IMPRESSION: 1. No acute intracranial abnormalities. 2. Chronic atrophy and small vessel ischemic changes. 3. Nasal bone fractures with mild depression. 4. Vascular calcifications. Electronically Signed   By: Lucienne Capers M.D.   On: 08/17/2017 21:21   Dg Chest Portable 1 View  Result Date: 08/17/2017 CLINICAL DATA:  Preoperative evaluation, known right femoral fracture EXAM: PORTABLE CHEST 1 VIEW COMPARISON:  09/25/2015 FINDINGS: Cardiac shadow is within normal limits. Mild aortic calcifications are seen. Hyperinflation of the lungs is noted. No acute infiltrate or sizable effusion is seen. No bony abnormality is noted. IMPRESSION: COPD without acute abnormality. Electronically Signed   By: Inez Catalina M.D.   On: 08/17/2017 19:27   Dg Hip Unilat  With Pelvis 2-3 Views Right  Result Date: 08/17/2017 CLINICAL DATA:  Recent fall with right leg pain, initial encounter EXAM: DG HIP (WITH OR WITHOUT PELVIS) 2-3V RIGHT COMPARISON:  None. FINDINGS: Pelvic ring is intact. There is an intratrochanteric right femoral fracture with some impaction and angulation at the fracture site. No gross soft tissue abnormality is seen. No dislocation is noted. IMPRESSION: Right intratrochanteric femoral fracture. Electronically Signed   By: Inez Catalina M.D.   On: 08/17/2017 19:25    Positive ROS: All other systems have  been reviewed and were otherwise negative with the exception of those mentioned in the HPI and as above.  Physical Exam: General: Alert, no acute distress Cardiovascular: No pedal edema Respiratory: No cyanosis, no use of accessory musculature GI: No organomegaly, abdomen is soft and non-tender Skin: No lesions in the area of chief complaint Neurologic: Sensation intact distally Psychiatric: Patient is competent for consent with normal mood and affect Lymphatic: No axillary or cervical lymphadenopathy  MUSCULOSKELETAL: Palpable DP/PT pulses, SILT to RLE.  Assessment: Right Intertrochanteric femur fracture  Plan: Medical optimization and risk stratification. Plan for OR for intramedullary nail fixation tomorrow.       08/17/2017 9:49 PM

## 2017-08-17 NOTE — ED Triage Notes (Signed)
Pt arrives via [facility] from [place], pt states that she doesn't pick her feet up when she walks and tripped over the rug, pt has outward rotation of the rt leg and rt hip pain

## 2017-08-17 NOTE — ED Notes (Signed)
Cecille Rubin, RN called to update neg urine output of foley, and pt OTW

## 2017-08-17 NOTE — ED Provider Notes (Signed)
[facility] Emergency Department Provider Note  ____________________________________________  Time seen: Approximately 7:44 PM  I have reviewed the triage vital signs and the nursing notes.   HISTORY  Chief Complaint Fall   HPI Linda Hart is a 83 y.o. female with a history of CAD and afib on Brillinta, CHF, and GERD who presents for evaluation of right hip pain status post mechanical fall. patient reports that she has a hard time picking up her feet when she walks which is chronic for her. She was leaving the dining hall at her skilled nursing facility when she saw some movement into the den. She turned around to see what was going on in there and tripped on the rug and fell onto her right side. She denies head trauma or LOC. Patient was unable to ambulate. She is complaining of mild pain located in her right hip that is sharp and constant, nonradiating. The pain becomes severe with any movement or palpation of the right hip. She denies neck pain, back pain, chest pain, abdominal pain, headache, any other extremity pain.  Past Medical History:  Diagnosis Date  . Coronary artery disease 03/06/13   90% prox LAD stenosis s/p DES  . Dizziness   . GERD (gastroesophageal reflux disease)   . Hypotension   . Ischemic cardiomyopathy 03/09/13   s/p NSTEMI. EF 30-35%.  . MI (myocardial infarction) ([place])   . PAF (paroxysmal atrial fibrillation) ([place]) 03/06/13   In setting of NSTEMI and reperfusion, no recurrence  . SVT (supraventricular tachycardia) (HCC)    Self-limited on outpatient cardiac monitor    Patient Active Problem List   Diagnosis Date Noted  . Closed right hip fracture ([place]) 08/17/2017  . Cutaneous lupus erythematosus 04/05/2017  . Nasal congestion 04/05/2017  . Do not resuscitate discussion 04/05/2017  . Anorexia 04/20/2016  . Bradycardia 04/20/2016  . Belching 09/28/2015  . Systolic and diastolic CHF, acute on chronic ([place]) 09/24/2015  . CHF  (congestive heart failure) ([place]) 09/23/2015  . Hematoma of left lower leg 09/12/2015  . Orthostatic hypotension 07/26/2015  . Anemia 01/26/2014  . Dry eyes 01/04/2014  . Shortness of breath 12/23/2013  . Cardiomyopathy, ischemic 03/17/2013  . Hyperlipidemia 03/17/2013  . CAD in native artery 03/17/2013  . PAF (paroxysmal atrial fibrillation) ([place]) 03/17/2013  . Hypertension 02/14/2012  . Pernicious anemia 07/10/2011  . SVT (supraventricular tachycardia) ([place]) 03/21/2011  . PALPITATIONS 08/28/2010    Past Surgical History:  Procedure Laterality Date  . CESAREAN SECTION    . CORONARY ANGIOPLASTY WITH STENT PLACEMENT  02/2013   DES-mid LAD, mild LCx, RCA stenoses    Prior to Admission medications   Medication Sig Start Date End Date Taking? Authorizing Provider  aspirin 81 MG tablet Take 81 mg by mouth daily.      [provider]  atorvastatin (LIPITOR) 20 MG tablet TAKE 1 TABLET BY MOUTH AT BEDTIME 05/27/17   Leone Haven, MD  BRILINTA 60 MG TABS tablet TAKE 1 TABLET BY MOUTH TWICE DAILY 07/23/17   Minna Merritts, MD  carvedilol (COREG) 3.125 MG tablet TAKE ONE TABLET TWICE A DAY WITH MEALS. 02/22/17   Leone Haven, MD  cyanocobalamin (,VITAMIN B-12,) 1000 MCG/ML injection INJECT 1ML INTO THE MUCSLE EVERY 30 DAYS 03/18/17   Leone Haven, MD  ferrous sulfate (KP FERROUS SULFATE) 325 (65 FE) MG tablet Take 325 mg by mouth 2 (two) times daily with a meal.    [provider]  furosemide (LASIX)  20 MG tablet Take 1 tablet (20 mg total) by mouth daily as needed. 05/21/17   Minna Merritts, MD  levalbuterol [facility] HFA) 45 MCG/ACT inhaler Inhale 1-2 puffs into the lungs every 4 (four) hours as needed for wheezing. Reported on 12/16/2015    [provider]  loratadine (CLARITIN) 10 MG tablet Take 1 tablet (10 mg total) by mouth daily. 01/04/14   Jackolyn Confer, MD  nitroGLYCERIN (NITROSTAT) 0.4 MG SL tablet Place 0.4 mg under the tongue every 5  (five) minutes as needed for chest pain.    [provider]    Allergies Penicillins  Family History  Problem Relation Age of Onset  . Heart failure Mother   . Heart failure Father   . Heart attack Father     Social History Social History  Substance Use Topics  . Smoking status: Never Smoker  . Smokeless tobacco: Never Used  . Alcohol use No     Comment: occasional    Review of Systems Constitutional: Negative for fever. Eyes: Negative for visual changes. ENT: Negative for facial injury or neck injury Cardiovascular: Negative for chest injury. Respiratory: Negative for shortness of breath. Negative for chest wall injury. Gastrointestinal: Negative for abdominal pain or injury. Genitourinary: Negative for dysuria. Musculoskeletal: Negative for back injury. + R hip pain Skin: Negative for laceration/abrasions. Neurological: Negative for head injury.   ____________________________________________   PHYSICAL EXAM:  VITAL SIGNS: Vitals:   08/17/17 2030 08/17/17 2154  BP: (!) 189/79 (!) 185/65  Pulse: 82 86  Resp: 20   Temp:  97.7 F (36.5 C)  SpO2: 100% 99%    Full spinal precautions maintained throughout the trauma exam. Constitutional: Alert and oriented. No acute distress. Does not appear intoxicated. HEENT Head: Normocephalic and atraumatic. Face: No facial bony tenderness. Stable midface Ears: No hemotympanum bilaterally. No Battle sign Eyes: No eye injury. PERRL. No raccoon eyes Nose: Nontender. No epistaxis. No rhinorrhea Mouth/Throat: Mucous membranes are moist. No oropharyngeal blood. No dental injury. Airway patent without stridor. Normal voice. Neck: no C-collar in place. No midline c-spine tenderness.  Cardiovascular: Normal rate, regular rhythm. Normal and symmetric distal pulses are present in all extremities. Pulmonary/Chest: Chest wall is stable and nontender to palpation/compression. Normal respiratory effort. Breath sounds are  normal. No crepitus.  Abdominal: Soft, nontender, non distended. Musculoskeletal: RLE is shortened and externally rotated with obvious deformity of the R hip. Nontender with normal full range of motion in all other extremities. No deformities. No thoracic or lumbar midline spinal tenderness. Pelvis is stable. Skin: Skin is warm, dry and intact. No abrasions or contutions. Psychiatric: Speech and behavior are appropriate. Neurological: Normal speech and language. Moves all extremities to command. No gross focal neurologic deficits are appreciated.  Glascow Coma Score: 4 - Opens eyes on own 6 - Follows simple motor commands 5 - Alert and oriented GCS: 15   ____________________________________________   LABS (all labs ordered are listed, but only abnormal results are displayed)  Labs Reviewed  BASIC METABOLIC PANEL - Abnormal; Notable for the following:       Result Value   Sodium 132 (*)    Glucose, Bld 112 (*)    BUN 27 (*)    Creatinine, Ser 1.29 (*)    GFR calc non Af Amer 36 (*)    GFR calc Af Amer 41 (*)    All other components within normal limits  CBC  PROTIME-INR  APTT  URINALYSIS, COMPLETE (UACMP) WITH MICROSCOPIC  BASIC METABOLIC  PANEL  CBC  TYPE AND SCREEN   ____________________________________________  EKG  ED ECG REPORT I, Rudene Re, the attending physician, personally viewed and interpreted this ECG.  Normal sinus rhythm, rate of 81, normal intervals, right axis deviation, no ST elevations or depressions, T-wave inversion in aVL and 1. those T-wave inversions are new when compared to prior from July 2018. ____________________________________________  RADIOLOGY  Head CT: 1. No acute intracranial abnormalities. 2. Chronic atrophy and small vessel ischemic changes. 3. Nasal bone fractures with mild depression. 4. Vascular calcifications.  CXR: COPD without acute abnormality.  XR pelvis and hip:  Right intratrochanteric femoral  fracture. ____________________________________________   PROCEDURES  Procedure(s) performed: None Procedures Critical Care performed:  None ____________________________________________   INITIAL IMPRESSION / ASSESSMENT AND PLAN / ED COURSE  83 y.o. female with a history of CAD and afib on Brillinta, CHF, and GERD who presents for evaluation of right hip pain status post mechanical fall. Patient with closed R hip fracture. Neurovascularly intact exam. Will get basic surgical labs, CXR and EKG pre-op. Since patient is on an antiplatelet, I will order a head CT to rule out intracranial bleed. Will consult ortho for surgical repair and hospitalist for admission.    _________________________ 7:56 PM on 08/17/2017 -----------------------------------------  Discussed with Dr. Daine Gip, Ortho on call who recommended nothing by mouth at midnight for possible surgical repair in the morning. Discussed with the hospitalist for admission. Morphine 2 mg every hour ordered for pain as needed.   As part of my medical decision making, I reviewed the following data within the [place] notes reviewed and incorporated, Labs reviewed , EKG interpreted , Radiograph reviewed , Discussed with admitting physician , A consult was requested and obtained from this/these consultant(s) Orthopedics, Notes from prior ED visits and [place] Controlled Substance Database    Pertinent labs & imaging results that were available during my care of the patient were reviewed by me and considered in my medical decision making (see chart for details).    ____________________________________________   FINAL CLINICAL IMPRESSION(S) / ED DIAGNOSES  Final diagnoses:  Closed fracture of right hip, initial encounter ([place])  Fall, initial encounter      NEW MEDICATIONS STARTED DURING THIS VISIT:  Current Discharge Medication List       Note:  This document was prepared using Dragon voice recognition  software and may include unintentional dictation errors.    Rudene Re, MD 08/17/17 [phone]

## 2017-08-18 ENCOUNTER — Encounter
Admission: EM | Disposition: A | Discharge status: Skilled Nursing Facility | Payer: Self-pay | Source: Home / Self Care | Attending: Internal Medicine

## 2017-08-18 LAB — BASIC METABOLIC PANEL
Anion gap: 9 (ref 5–15)
BUN: 25 mg/dL — AB (ref 6–20)
CO2: 20 mmol/L — ABNORMAL LOW (ref 22–32)
Calcium: 8.7 mg/dL — ABNORMAL LOW (ref 8.9–10.3)
Chloride: 105 mmol/L (ref 101–111)
Creatinine, Ser: 1.13 mg/dL — ABNORMAL HIGH (ref 0.44–1.00)
GFR calc Af Amer: 48 mL/min — ABNORMAL LOW (ref >60)
GFR, EST NON AFRICAN AMERICAN: 42 mL/min — AB (ref >60)
GLUCOSE: 114 mg/dL — AB (ref 65–99)
POTASSIUM: 4.6 mmol/L (ref 3.5–5.1)
Sodium: 134 mmol/L — ABNORMAL LOW (ref 135–145)

## 2017-08-18 LAB — CBC
HEMATOCRIT: 34.2 % — AB (ref 35.0–47.0)
Hemoglobin: 11.5 g/dL — ABNORMAL LOW (ref 12.0–16.0)
MCH: 30.8 pg (ref 26.0–34.0)
MCHC: 33.6 g/dL (ref 32.0–36.0)
MCV: 91.6 fL (ref 80.0–100.0)
Platelets: 236 10*3/uL (ref 150–440)
RBC: 3.74 MIL/uL — ABNORMAL LOW (ref 3.80–5.20)
RDW: 14.1 % (ref 11.5–14.5)
WBC: 14.1 10*3/uL — ABNORMAL HIGH (ref 3.6–11.0)

## 2017-08-18 SURGERY — FIXATION, FRACTURE, INTERTROCHANTERIC, WITH INTRAMEDULLARY ROD
Anesthesia: Choice | Laterality: Right

## 2017-08-18 MED ORDER — MORPHINE SULFATE (PF) 2 MG/ML IV SOLN: 2.0000 mg | INTRAVENOUS | Status: DC | PRN

## 2017-08-18 MED ORDER — OXYCODONE-ACETAMINOPHEN 5-325 MG PO TABS
1.0000 | ORAL_TABLET | ORAL | Status: DC | PRN
Start: 2017-08-18 — End: 2017-08-20

## 2017-08-18 SURGICAL SUPPLY — 27 items
CANISTER SUCT 1200ML W/VALVE (MISCELLANEOUS) ×3 IMPLANT
DRAPE C-ARMOR (DRAPES) ×3 IMPLANT
DRAPE SHEET LG 3/4 BI-LAMINATE (DRAPES) ×3 IMPLANT
DRAPE TABLE BACK 80X90 (DRAPES) ×3 IMPLANT
DRSG OPSITE POSTOP 4X12 (GAUZE/BANDAGES/DRESSINGS) ×3 IMPLANT
DRSG OPSITE POSTOP 4X6 (GAUZE/BANDAGES/DRESSINGS) ×3 IMPLANT
DURAPREP 26ML APPLICATOR (WOUND CARE) ×3 IMPLANT
ELECT REM PT RETURN 9FT ADLT (ELECTROSURGICAL) ×3
ELECTRODE REM PT RTRN 9FT ADLT (ELECTROSURGICAL) ×1 IMPLANT
GAUZE SPONGE 4X4 12PLY STRL (GAUZE/BANDAGES/DRESSINGS) ×3 IMPLANT
GLOVE BIO SURGEON STRL SZ7.5 (GLOVE) ×3 IMPLANT
GLOVE INDICATOR 8.0 STRL GRN (GLOVE) ×3 IMPLANT
GOWN STRL REUS W/ TWL LRG LVL4 (GOWN DISPOSABLE) ×2 IMPLANT
GOWN STRL REUS W/TWL LRG LVL4 (GOWN DISPOSABLE) ×4
KIT RM TURNOVER CYSTO AR (KITS) ×3 IMPLANT
MAT BLUE FLOOR 46X72 FLO (MISCELLANEOUS) ×3 IMPLANT
NS IRRIG 1000ML POUR BTL (IV SOLUTION) ×3 IMPLANT
PACK HIP COMPR (MISCELLANEOUS) ×3 IMPLANT
REAMER ROD DEEP FLUTE 2.5X950 (INSTRUMENTS) ×3 IMPLANT
STAPLER SKIN PROX 35W (STAPLE) ×3 IMPLANT
SUCTION FRAZIER HANDLE 10FR (MISCELLANEOUS) ×2
SUCTION TUBE FRAZIER 10FR DISP (MISCELLANEOUS) ×1 IMPLANT
SUT VIC AB 0 CT1 36 (SUTURE) ×3 IMPLANT
SUT VIC AB 1 CT1 36 (SUTURE) ×3 IMPLANT
SUT VIC AB 2-0 CT1 27 (SUTURE) ×2
SUT VIC AB 2-0 CT1 TAPERPNT 27 (SUTURE) ×1 IMPLANT
TAPE MICROFOAM 4IN (TAPE) ×3 IMPLANT

## 2017-08-18 NOTE — Progress Notes (Signed)
[place] [facility] [place] NAME: Linda Hart    MR#:  [id number]  DATE OF BIRTH:  [birth year]  SUBJECTIVE:  CHIEF COMPLAINT:   Chief Complaint  Patient presents with  . Fall   -Patient admitted after a fall and right hip pain and noted to have right femoral intertrochanteric fracture. -Scheduled for surgery tomorrow -Denies any discomfort or pain at this time  REVIEW OF SYSTEMS:  Review of Systems  Constitutional: Negative for chills, fever and malaise/fatigue.  HENT: Positive for hearing loss. Negative for congestion, ear discharge and nosebleeds.   Eyes: Negative for blurred vision and double vision.  Respiratory: Negative for cough, shortness of breath and wheezing.   Cardiovascular: Negative for chest pain, palpitations and leg swelling.  Gastrointestinal: Negative for abdominal pain, constipation, diarrhea, nausea and vomiting.  Genitourinary: Negative for dysuria.  Musculoskeletal: Positive for joint pain and myalgias.  Neurological: Negative for dizziness, speech change, focal weakness, seizures and headaches.  Psychiatric/Behavioral: Negative for depression.    DRUG ALLERGIES:   Allergies  Allergen Reactions  . Penicillins     Has patient had a PCN reaction causing immediate rash, facial/tongue/throat swelling, SOB or lightheadedness with hypotension: Unknown Has patient had a PCN reaction causing severe rash involving mucus membranes or skin necrosis: Unknown Has patient had a PCN reaction that required hospitalization No Has patient had a PCN reaction occurring within the last 10 years: No If all of the above answers are "NO", then may proceed with Cephalosporin use.    VITALS:  Blood pressure (!) 153/61, pulse 92, temperature 98.6 F (37 C), temperature source Oral, resp. rate 18, height 5' (1.524 m), weight 44.7 kg (98 lb 9.6 oz), SpO2 96 %.  PHYSICAL EXAMINATION:  Physical Exam  GENERAL:  83 y.o.-year-old elderly  patient lying in the bed with no acute distress.  EYES: Pupils equal, round, reactive to light and accommodation. No scleral icterus. Extraocular muscles intact.  HEENT: Head atraumatic, normocephalic. Oropharynx and nasopharynx clear.  NECK:  Supple, no jugular venous distention. No thyroid enlargement, no tenderness.  LUNGS: Normal breath sounds bilaterally, no wheezing, rales,rhonchi or crepitation. No use of accessory muscles of respiration. Decreased bibasilar breath sounds CARDIOVASCULAR: S1, S2 normal. No  rubs, or gallops. 2/6 systolic murmur is present ABDOMEN: Soft, nontender, nondistended. Bowel sounds present. No organomegaly or mass.  EXTREMITIES: Right leg is abducted and externally rotated. No pedal edema, cyanosis, or clubbing.  NEUROLOGIC: Cranial nerves II through XII are intact. Muscle strength 5/5 in all extremities. Sensation intact. Gait not checked.  PSYCHIATRIC: The patient is alert and oriented x 2. Easily reoriented SKIN: No obvious rash, lesion, or ulcer.    LABORATORY PANEL:   CBC  Recent Labs Lab 08/18/17 0327  WBC 14.1*  HGB 11.5*  HCT 34.2*  PLT 236   ------------------------------------------------------------------------------------------------------------------  Chemistries   Recent Labs Lab 08/18/17 0327  NA 134*  K 4.6  CL 105  CO2 20*  GLUCOSE 114*  BUN 25*  CREATININE 1.13*  CALCIUM 8.7*   ------------------------------------------------------------------------------------------------------------------  Cardiac Enzymes No results for input(s): TROPONINI in the last 168 hours. ------------------------------------------------------------------------------------------------------------------  RADIOLOGY:  Ct Head Wo Contrast  Result Date: 08/17/2017 CLINICAL DATA:  Patient does not pick up her feet when she walks. Tripped over the rock. Right hip pain. Right hip fracture. EXAM: CT HEAD WITHOUT CONTRAST TECHNIQUE: Contiguous axial  images were obtained from the base of the skull through the vertex without intravenous contrast. COMPARISON:  12/05/2011  FINDINGS: Brain: Diffuse cerebral atrophy. Mild ventricular dilatation consistent with central atrophy. Low-attenuation changes in the deep white matter consistent with small vessel ischemia. Basal ganglia calcifications. No mass effect or midline shift. No abnormal extra-axial fluid collections. Gray-white matter junctions are distinct. Basal cisterns are not effaced. No acute intracranial hemorrhage. Vascular: Vascular calcifications are present in the internal carotid and vertebrobasilar vessels. Skull: Calvarium appears intact. No acute depressed skull fractures. Focal lucency in the left anterior frontal region is unchanged since previous study and probably represents a hemangioma. Sinuses/Orbits: Acute appearing nasal bone fractures. Paranasal sinuses are clear. Mastoid air cells are clear. Other: None. IMPRESSION: 1. No acute intracranial abnormalities. 2. Chronic atrophy and small vessel ischemic changes. 3. Nasal bone fractures with mild depression. 4. Vascular calcifications. Electronically Signed   By: Lucienne Capers M.D.   On: 08/17/2017 21:21   Dg Chest Portable 1 View  Result Date: 08/17/2017 CLINICAL DATA:  Preoperative evaluation, known right femoral fracture EXAM: PORTABLE CHEST 1 VIEW COMPARISON:  09/25/2015 FINDINGS: Cardiac shadow is within normal limits. Mild aortic calcifications are seen. Hyperinflation of the lungs is noted. No acute infiltrate or sizable effusion is seen. No bony abnormality is noted. IMPRESSION: COPD without acute abnormality. Electronically Signed   By: Inez Catalina M.D.   On: 08/17/2017 19:27   Dg Hip Unilat  With Pelvis 2-3 Views Right  Result Date: 08/17/2017 CLINICAL DATA:  Recent fall with right leg pain, initial encounter EXAM: DG HIP (WITH OR WITHOUT PELVIS) 2-3V RIGHT COMPARISON:  None. FINDINGS: Pelvic ring is intact. There is an  intratrochanteric right femoral fracture with some impaction and angulation at the fracture site. No gross soft tissue abnormality is seen. No dislocation is noted. IMPRESSION: Right intratrochanteric femoral fracture. Electronically Signed   By: Inez Catalina M.D.   On: 08/17/2017 19:25    EKG:   Orders placed or performed during the hospital encounter of 08/17/17  . ED EKG  . ED EKG  . EKG 12-Lead  . EKG 12-Lead    ASSESSMENT AND PLAN:   83 year old female with past medical history significant for CAD status post LAD stent in 2014, ischemic cardiac myopathy with last bony of 440-45%, paroxysmal atrial fibrillation not on anticoagulation, history of SVT presents to the hospital secondary to fall and right femoral intertrochanteric fracture.  #1 right femoral intertrochanteric fracture-appreciate orthopedic consult. -Due to patient's preference for a surgeon, surgery possibly scheduled for tomorrow -Continue pain control. DVT prophylaxis and physical therapy will be started after surgery -Continue Foley catheter. -Patient okay to proceed with surgery, no recent cardiac symptoms. Continue carvedilol in the perioperative period  #2 CAD status post stent-also known ischemic cardiomyopathy with EF of 40-45% -Aspirin and Brilinta on hold. LAD stent in 2014. Continue carvedilol -Lasix on hold as well compensated at this time.  #3 anemia of chronic disease- on iron supplements. Monitor hemoglobin after surgery  #4 DVT prophylaxis - will be started after surgery     All the records are reviewed and case discussed with Care Management/Social Workerr. Management plans discussed with the patient, family and they are in agreement.  CODE STATUS: Full code  TOTAL TIME TAKING CARE OF THIS PATIENT: 37 minutes.   POSSIBLE D/C IN 2-3 DAYS, DEPENDING ON CLINICAL CONDITION.   Gladstone Lighter M.D on 08/18/2017 at 8:07 AM  Between 7am to 6pm - Pager - [phone]  After 6pm go to  [email or web address] - password [email or web address]  Office  [phone]  CC: Primary  care physician; Leone Haven, MD

## 2017-08-18 NOTE — Progress Notes (Signed)
While obtaining vitals,[patient complaining of pain at urethral catheter insertion site. Foley assessed. No drainage in bag, perineal area reddened and irritated. Bed is soaked with urine and patient is as well. Attempted to place new foley in but was unsuccessful. External catheter placed instead.

## 2017-08-18 NOTE — NC FL2 (Signed)
[place] LEVEL OF CARE SCREENING TOOL     IDENTIFICATION  Patient Name: Linda Hart Birthdate: [birth year] Sex: female Admission Date (Current Location): 08/17/2017  [place] and [place] Number:  Engineering geologist and Address:  [facility], [address], [place], [place] [address]      Provider Number: [id number]  Attending Physician Name and Address:  Gladstone Lighter, MD  Relative Name and Phone Number:  Ellison Carwin (daughter) [phone]    Current Level of Care: Hospital Recommended Level of Care: [place] Prior Approval Number:    Date Approved/Denied:   PASRR Number:    Discharge Plan: SNF    Current Diagnoses: Patient Active Problem List   Diagnosis Date Noted  . Closed right hip fracture ([place]) 08/17/2017  . Cutaneous lupus erythematosus 04/05/2017  . Nasal congestion 04/05/2017  . Do not resuscitate discussion 04/05/2017  . Anorexia 04/20/2016  . Bradycardia 04/20/2016  . Belching 09/28/2015  . Systolic and diastolic CHF, acute on chronic ([place]) 09/24/2015  . CHF (congestive heart failure) ([place]) 09/23/2015  . Hematoma of left lower leg 09/12/2015  . Orthostatic hypotension 07/26/2015  . Anemia 01/26/2014  . Dry eyes 01/04/2014  . Shortness of breath 12/23/2013  . Cardiomyopathy, ischemic 03/17/2013  . Hyperlipidemia 03/17/2013  . CAD in native artery 03/17/2013  . PAF (paroxysmal atrial fibrillation) ([place]) 03/17/2013  . Hypertension 02/14/2012  . Pernicious anemia 07/10/2011  . SVT (supraventricular tachycardia) ([place]) 03/21/2011  . PALPITATIONS 08/28/2010    Orientation RESPIRATION BLADDER Height & Weight     Self, Time, Situation, Place  Normal External catheter Weight: 98 lb 9.6 oz (44.7 kg) Height:  5' (152.4 cm)  BEHAVIORAL SYMPTOMS/MOOD NEUROLOGICAL BOWEL NUTRITION STATUS      Incontinent Diet (heart healthy)  AMBULATORY STATUS COMMUNICATION OF NEEDS Skin   Extensive  Assist Verbally Surgical wounds                       Personal Care Assistance Level of Assistance  Bathing, Feeding, Dressing Bathing Assistance: Limited assistance Feeding assistance: Independent Dressing Assistance: Limited assistance     Functional Limitations Info             SPECIAL CARE FACTORS FREQUENCY  PT (By licensed PT)     PT Frequency: Up to 5X per day, 5 days per week              Contractures Contractures Info: Not present    Additional Factors Info  Code Status, Allergies Code Status Info: Full Allergies Info: Penecillins           Current Medications (08/18/2017):  This is the current hospital active medication list Current Facility-Administered Medications  Medication Dose Route Frequency Provider Last Rate Last Dose  . acetaminophen (TYLENOL) tablet 650 mg  650 mg Oral Q6H PRN Henreitta Leber, MD   650 mg at 08/18/17 0150   Or  . acetaminophen (TYLENOL) suppository 650 mg  650 mg Rectal Q6H PRN Henreitta Leber, MD      . atorvastatin (LIPITOR) tablet 20 mg  20 mg Oral QHS Sainani, Belia Heman, MD      . carvedilol (COREG) tablet 3.125 mg  3.125 mg Oral BID WC Henreitta Leber, MD   3.125 mg at 08/18/17 0841  . ferrous sulfate tablet 325 mg  325 mg Oral BID WC Sainani, Belia Heman, MD      . loratadine (CLARITIN) tablet 10 mg  10 mg  Oral Daily Henreitta Leber, MD      . morphine 2 MG/ML injection 2 mg  2 mg Intravenous Q4H PRN Gladstone Lighter, MD      . nitroGLYCERIN (NITROSTAT) SL tablet 0.4 mg  0.4 mg Sublingual Q5 min PRN Henreitta Leber, MD      . ondansetron (ZOFRAN) tablet 4 mg  4 mg Oral Q6H PRN Henreitta Leber, MD       Or  . ondansetron (ZOFRAN) injection 4 mg  4 mg Intravenous Q6H PRN Henreitta Leber, MD      . oxyCODONE-acetaminophen (PERCOCET/ROXICET) 5-325 MG per tablet 1 tablet  1 tablet Oral Q4H PRN Gladstone Lighter, MD         Discharge Medications: Please see discharge summary for a list of discharge  medications.  Relevant Imaging Results:  Relevant Lab Results:   Additional Information SS# [id number]  Zettie Pho, LCSW

## 2017-08-19 ENCOUNTER — Encounter
Admission: EM | Disposition: A | Discharge status: Skilled Nursing Facility | Payer: Self-pay | Source: Home / Self Care | Attending: Internal Medicine

## 2017-08-19 ENCOUNTER — Encounter: Payer: Self-pay | Admitting: Anesthesiology

## 2017-08-19 ENCOUNTER — Inpatient Hospital Stay: Payer: Medicare Other | Admitting: Anesthesiology

## 2017-08-19 ENCOUNTER — Inpatient Hospital Stay: Payer: Medicare Other

## 2017-08-19 ENCOUNTER — Telehealth: Payer: Self-pay | Admitting: Cardiovascular Disease

## 2017-08-19 HISTORY — PX: INTRAMEDULLARY (IM) NAIL INTERTROCHANTERIC: SHX5875

## 2017-08-19 LAB — CBC
HCT: 30.9 % — ABNORMAL LOW (ref 35.0–47.0)
HEMATOCRIT: 27.2 % — AB (ref 35.0–47.0)
HEMOGLOBIN: 9.1 g/dL — AB (ref 12.0–16.0)
Hemoglobin: 10.4 g/dL — ABNORMAL LOW (ref 12.0–16.0)
MCH: 31 pg (ref 26.0–34.0)
MCH: 30.9 pg (ref 26.0–34.0)
MCHC: 33.8 g/dL (ref 32.0–36.0)
MCHC: 33.3 g/dL (ref 32.0–36.0)
MCV: 91.7 fL (ref 80.0–100.0)
MCV: 92.9 fL (ref 80.0–100.0)
PLATELETS: 213 10*3/uL (ref 150–440)
PLATELETS: 207 10*3/uL (ref 150–440)
RBC: 3.37 MIL/uL — AB (ref 3.80–5.20)
RBC: 2.93 MIL/uL — AB (ref 3.80–5.20)
RDW: 13.9 % (ref 11.5–14.5)
RDW: 14.3 % (ref 11.5–14.5)
WBC: 13.2 10*3/uL — AB (ref 3.6–11.0)
WBC: 19.6 10*3/uL — AB (ref 3.6–11.0)

## 2017-08-19 LAB — BASIC METABOLIC PANEL
ANION GAP: 4 — AB (ref 5–15)
BUN: 28 mg/dL — ABNORMAL HIGH (ref 6–20)
CO2: 22 mmol/L (ref 22–32)
Calcium: 8.3 mg/dL — ABNORMAL LOW (ref 8.9–10.3)
Chloride: 106 mmol/L (ref 101–111)
Creatinine, Ser: 1.32 mg/dL — ABNORMAL HIGH (ref 0.44–1.00)
GFR calc Af Amer: 40 mL/min — ABNORMAL LOW (ref >60)
GFR, EST NON AFRICAN AMERICAN: 35 mL/min — AB (ref >60)
Glucose, Bld: 125 mg/dL — ABNORMAL HIGH (ref 65–99)
POTASSIUM: 4.3 mmol/L (ref 3.5–5.1)
Sodium: 132 mmol/L — ABNORMAL LOW (ref 135–145)

## 2017-08-19 SURGERY — FIXATION, FRACTURE, INTERTROCHANTERIC, WITH INTRAMEDULLARY ROD
Anesthesia: General | Laterality: Right | Wound class: Clean

## 2017-08-19 MED ORDER — SODIUM CHLORIDE 0.9 % IV SOLN
INTRAVENOUS | Status: DC
  Administered 2017-08-19 – 2017-08-21 (×2): via INTRAVENOUS

## 2017-08-19 MED ORDER — METOCLOPRAMIDE HCL 5 MG/ML IJ SOLN: 5.0000 mg | Freq: Three times a day (TID) | INTRAMUSCULAR | Status: DC | PRN

## 2017-08-19 MED ORDER — SODIUM CHLORIDE 0.9 % IV BOLUS (SEPSIS)
500.0000 mL | Freq: Once | INTRAVENOUS | Status: AC
  Administered 2017-08-19: 500 mL via INTRAVENOUS

## 2017-08-19 MED ORDER — FENTANYL CITRATE (PF) 100 MCG/2ML IJ SOLN
INTRAMUSCULAR | Status: DC | PRN
  Administered 2017-08-19: 50 ug via INTRAVENOUS

## 2017-08-19 MED ORDER — MENTHOL 3 MG MT LOZG
1.0000 | LOZENGE | OROMUCOSAL | Status: DC | PRN
  Filled 2017-08-19: qty 9

## 2017-08-19 MED ORDER — PHENOL 1.4 % MT LIQD
1.0000 | OROMUCOSAL | Status: DC | PRN
  Filled 2017-08-19: qty 177

## 2017-08-19 MED ORDER — ONDANSETRON HCL 4 MG/2ML IJ SOLN
INTRAMUSCULAR | Status: DC | PRN
  Administered 2017-08-19: 4 mg via INTRAVENOUS

## 2017-08-19 MED ORDER — ALUM & MAG HYDROXIDE-SIMETH 200-200-20 MG/5ML PO SUSP
30.0000 mL | ORAL | Status: DC | PRN
  Administered 2017-08-22: 30 mL via ORAL
  Filled 2017-08-19: qty 30

## 2017-08-19 MED ORDER — MAGNESIUM CITRATE PO SOLN
1.0000 | Freq: Once | ORAL | Status: DC | PRN
  Filled 2017-08-19: qty 296

## 2017-08-19 MED ORDER — VITAMIN C 500 MG PO TABS
1000.0000 mg | ORAL_TABLET | Freq: Every day | ORAL | Status: DC
  Administered 2017-08-20 – 2017-08-22 (×3): 1000 mg via ORAL
  Filled 2017-08-19 (×4): qty 2

## 2017-08-19 MED ORDER — SODIUM CHLORIDE 0.9 % IJ SOLN
INTRAMUSCULAR | Status: AC
  Filled 2017-08-19: qty 10

## 2017-08-19 MED ORDER — ONDANSETRON HCL 4 MG/2ML IJ SOLN
INTRAMUSCULAR | Status: AC
  Filled 2017-08-19: qty 2

## 2017-08-19 MED ORDER — CLINDAMYCIN PHOSPHATE 900 MG/50ML IV SOLN
INTRAVENOUS | Status: DC | PRN
  Administered 2017-08-19: 600 mg via INTRAVENOUS

## 2017-08-19 MED ORDER — HYDROCODONE-ACETAMINOPHEN 5-325 MG PO TABS: 1.0000 | ORAL_TABLET | Freq: Four times a day (QID) | ORAL | Status: DC | PRN

## 2017-08-19 MED ORDER — EPHEDRINE SULFATE 50 MG/ML IJ SOLN
INTRAMUSCULAR | Status: AC
  Filled 2017-08-19: qty 1

## 2017-08-19 MED ORDER — MIDAZOLAM HCL 2 MG/2ML IJ SOLN
INTRAMUSCULAR | Status: AC
  Filled 2017-08-19: qty 2

## 2017-08-19 MED ORDER — MAGNESIUM HYDROXIDE 400 MG/5ML PO SUSP: 30.0000 mL | Freq: Every day | ORAL | Status: DC | PRN

## 2017-08-19 MED ORDER — ETOMIDATE 2 MG/ML IV SOLN
INTRAVENOUS | Status: DC | PRN
  Administered 2017-08-19: 10 mg via INTRAVENOUS

## 2017-08-19 MED ORDER — CLINDAMYCIN PHOSPHATE 600 MG/50ML IV SOLN
600.0000 mg | Freq: Four times a day (QID) | INTRAVENOUS | Status: AC
  Administered 2017-08-19 (×2): 600 mg via INTRAVENOUS
  Filled 2017-08-19 (×2): qty 50

## 2017-08-19 MED ORDER — BISACODYL 10 MG RE SUPP: 10.0000 mg | Freq: Every day | RECTAL | Status: DC | PRN

## 2017-08-19 MED ORDER — SUGAMMADEX SODIUM 200 MG/2ML IV SOLN
INTRAVENOUS | Status: AC
  Filled 2017-08-19: qty 2

## 2017-08-19 MED ORDER — ACETAMINOPHEN 10 MG/ML IV SOLN
INTRAVENOUS | Status: DC | PRN
  Administered 2017-08-19: 1000 mg via INTRAVENOUS

## 2017-08-19 MED ORDER — LIDOCAINE HCL (CARDIAC) 20 MG/ML IV SOLN
INTRAVENOUS | Status: DC | PRN
  Administered 2017-08-19: 40 mg via INTRAVENOUS

## 2017-08-19 MED ORDER — CLINDAMYCIN PHOSPHATE 600 MG/50ML IV SOLN
600.0000 mg | Freq: Once | INTRAVENOUS | Status: DC
  Filled 2017-08-19: qty 50

## 2017-08-19 MED ORDER — ZOLPIDEM TARTRATE 5 MG PO TABS: 5.0000 mg | ORAL_TABLET | Freq: Every evening | ORAL | Status: DC | PRN

## 2017-08-19 MED ORDER — SUCCINYLCHOLINE CHLORIDE 20 MG/ML IJ SOLN
INTRAMUSCULAR | Status: DC | PRN
  Administered 2017-08-19: 50 mg via INTRAVENOUS

## 2017-08-19 MED ORDER — LACTATED RINGERS IV SOLN
INTRAVENOUS | Status: DC
  Administered 2017-08-19: 13:00:00 via INTRAVENOUS

## 2017-08-19 MED ORDER — LIDOCAINE HCL (PF) 2 % IJ SOLN
INTRAMUSCULAR | Status: AC
  Filled 2017-08-19: qty 10

## 2017-08-19 MED ORDER — PHENYLEPHRINE HCL 10 MG/ML IJ SOLN
INTRAMUSCULAR | Status: DC | PRN
  Administered 2017-08-19 (×2): 50 ug via INTRAVENOUS

## 2017-08-19 MED ORDER — SUGAMMADEX SODIUM 200 MG/2ML IV SOLN
INTRAVENOUS | Status: DC | PRN
  Administered 2017-08-19: 90 mg via INTRAVENOUS

## 2017-08-19 MED ORDER — ACETAMINOPHEN 10 MG/ML IV SOLN
INTRAVENOUS | Status: AC
  Filled 2017-08-19: qty 100

## 2017-08-19 MED ORDER — OXYCODONE HCL 5 MG/5ML PO SOLN: 5.0000 mg | Freq: Once | ORAL | Status: DC | PRN

## 2017-08-19 MED ORDER — FENTANYL CITRATE (PF) 100 MCG/2ML IJ SOLN: 25.0000 ug | INTRAMUSCULAR | Status: DC | PRN

## 2017-08-19 MED ORDER — FENTANYL CITRATE (PF) 100 MCG/2ML IJ SOLN
INTRAMUSCULAR | Status: AC
  Filled 2017-08-19: qty 2

## 2017-08-19 MED ORDER — ROCURONIUM BROMIDE 100 MG/10ML IV SOLN
INTRAVENOUS | Status: DC | PRN
  Administered 2017-08-19: 5 mg via INTRAVENOUS
  Administered 2017-08-19: 20 mg via INTRAVENOUS

## 2017-08-19 MED ORDER — ROCURONIUM BROMIDE 50 MG/5ML IV SOLN
INTRAVENOUS | Status: AC
  Filled 2017-08-19: qty 1

## 2017-08-19 MED ORDER — ETOMIDATE 2 MG/ML IV SOLN
INTRAVENOUS | Status: AC
  Filled 2017-08-19: qty 10

## 2017-08-19 MED ORDER — OXYCODONE HCL 5 MG PO TABS: 5.0000 mg | ORAL_TABLET | Freq: Once | ORAL | Status: DC | PRN

## 2017-08-19 MED ORDER — DOCUSATE SODIUM 100 MG PO CAPS
100.0000 mg | ORAL_CAPSULE | Freq: Two times a day (BID) | ORAL | Status: DC
  Administered 2017-08-19 – 2017-08-20 (×2): 100 mg via ORAL
  Filled 2017-08-19 (×2): qty 1

## 2017-08-19 MED ORDER — NEOMYCIN-POLYMYXIN B GU 40-200000 IR SOLN
Status: DC | PRN
  Administered 2017-08-19: 2 mL

## 2017-08-19 MED ORDER — METOCLOPRAMIDE HCL 10 MG PO TABS: 5.0000 mg | ORAL_TABLET | Freq: Three times a day (TID) | ORAL | Status: DC | PRN

## 2017-08-19 MED ORDER — ENOXAPARIN SODIUM 30 MG/0.3ML ~~LOC~~ SOLN
30.0000 mg | SUBCUTANEOUS | Status: DC
  Administered 2017-08-20 – 2017-08-22 (×3): 30 mg via SUBCUTANEOUS
  Filled 2017-08-19 (×3): qty 0.3

## 2017-08-19 MED ORDER — CLINDAMYCIN PHOSPHATE 900 MG/50ML IV SOLN
INTRAVENOUS | Status: AC
  Filled 2017-08-19: qty 50

## 2017-08-19 SURGICAL SUPPLY — 31 items
BIT DRILL CANN LG 4.3MM (BIT) ×1 IMPLANT
CANISTER SUCT 1200ML W/VALVE (MISCELLANEOUS) ×3 IMPLANT
CHLORAPREP W/TINT 26ML (MISCELLANEOUS) ×3 IMPLANT
DRAPE SHEET LG 3/4 BI-LAMINATE (DRAPES) ×3 IMPLANT
DRAPE U-SHAPE 47X51 STRL (DRAPES) ×3 IMPLANT
DRILL BIT CANN LG 4.3MM (BIT) ×3
DRSG OPSITE POSTOP 3X4 (GAUZE/BANDAGES/DRESSINGS) ×9 IMPLANT
GLOVE BIOGEL PI IND STRL 9 (GLOVE) ×1 IMPLANT
GLOVE BIOGEL PI INDICATOR 9 (GLOVE) ×2
GLOVE SURG SYN 9.0  PF PI (GLOVE) ×2
GLOVE SURG SYN 9.0 PF PI (GLOVE) ×1 IMPLANT
GOWN SRG 2XL LVL 4 RGLN SLV (GOWNS) ×1 IMPLANT
GOWN STRL NON-REIN 2XL LVL4 (GOWNS) ×2
GOWN STRL REUS W/ TWL LRG LVL3 (GOWN DISPOSABLE) ×1 IMPLANT
GOWN STRL REUS W/TWL LRG LVL3 (GOWN DISPOSABLE) ×2
GUIDEPIN VERSANAIL DSP 3.2X444 (ORTHOPEDIC DISPOSABLE SUPPLIES) ×3 IMPLANT
GUIDEWIRE BALL NOSE 100CM (WIRE) ×3 IMPLANT
KIT RM TURNOVER STRD PROC AR (KITS) ×3 IMPLANT
MAT BLUE FLOOR 46X72 FLO (MISCELLANEOUS) ×3 IMPLANT
NAIL HIP FRACT 130D 11X180 (Screw) ×3 IMPLANT
NEEDLE FILTER BLUNT 18X 1/2SAF (NEEDLE) ×2
NEEDLE FILTER BLUNT 18X1 1/2 (NEEDLE) ×1 IMPLANT
NS IRRIG 500ML POUR BTL (IV SOLUTION) ×3 IMPLANT
PACK HIP COMPR (MISCELLANEOUS) ×3 IMPLANT
SCREW BONE CORTICAL 5.0X36 (Screw) ×3 IMPLANT
SCREW DRILL BIT ANIT ROTATION (BIT) ×3 IMPLANT
SCREW LAG HIP NAIL 10.5X95 (Screw) ×3 IMPLANT
STAPLER SKIN PROX 35W (STAPLE) ×3 IMPLANT
SUT VIC AB 1 CT1 36 (SUTURE) ×3 IMPLANT
SUT VIC AB 2-0 CT1 (SUTURE) ×3 IMPLANT
SYRINGE 10CC LL (SYRINGE) ×3 IMPLANT

## 2017-08-19 NOTE — Progress Notes (Signed)
Filed Weights   08/17/17 1842 08/17/17 2154 08/19/17 1301  Weight: 96 lb (43.5 kg) 98 lb 9.6 oz (44.7 kg) 98 lb (44.5 kg)   Estimated Creatinine Clearance: 20.3 mL/min (A) (by C-G formula based on SCr of 1.32 mg/dL (H)).  Will adjust Lovenox dosing to 30 mg q 24 hours for CrCl < 30 ml/min.   Ulice Dash, PharmD Clinical Pharmacist

## 2017-08-19 NOTE — Progress Notes (Signed)
Patient with right hip fracture, plan ORIF today. Risks, benefits, alternatives discussed with patient and family. Site marked.

## 2017-08-19 NOTE — Progress Notes (Signed)
Assumed care of patient at 0100. No complaints. NPO since midnight in preparation for potential surgery today. No complaints of pain.

## 2017-08-19 NOTE — Progress Notes (Signed)
[facility] [place] NAME: Linda Hart    MR#:  [id number]  DATE OF BIRTH:  04/25/1925  SUBJECTIVE:  CHIEF COMPLAINT:   Chief Complaint  Patient presents with  . Fall   -Patient admitted after a fall and right hip pain and noted to have right femoral intertrochanteric fracture. -Scheduled for surgery today -leg spasms at times  REVIEW OF SYSTEMS:  Review of Systems  Constitutional: Negative for chills, fever and malaise/fatigue.  HENT: Positive for hearing loss. Negative for congestion, ear discharge and nosebleeds.   Eyes: Negative for blurred vision and double vision.  Respiratory: Negative for cough, shortness of breath and wheezing.   Cardiovascular: Negative for chest pain, palpitations and leg swelling.  Gastrointestinal: Negative for abdominal pain, constipation, diarrhea, nausea and vomiting.  Genitourinary: Negative for dysuria.  Musculoskeletal: Positive for joint pain and myalgias.  Neurological: Negative for dizziness, speech change, focal weakness, seizures and headaches.  Psychiatric/Behavioral: Negative for depression.    DRUG ALLERGIES:   Allergies  Allergen Reactions  . Penicillins     Has patient had a PCN reaction causing immediate rash, facial/tongue/throat swelling, SOB or lightheadedness with hypotension: Unknown Has patient had a PCN reaction causing severe rash involving mucus membranes or skin necrosis: Unknown Has patient had a PCN reaction that required hospitalization No Has patient had a PCN reaction occurring within the last 10 years: No If all of the above answers are "NO", then may proceed with Cephalosporin use.    VITALS:  Blood pressure 138/75, pulse 98, temperature 98.4 F (36.9 C), temperature source Oral, resp. rate 16, height 5' (1.524 m), weight 44.7 kg (98 lb 9.6 oz), SpO2 98 %.  PHYSICAL EXAMINATION:  Physical Exam  GENERAL:  83 y.o.-year-old elderly patient lying in the bed  with no acute distress.  EYES: Pupils equal, round, reactive to light and accommodation. No scleral icterus. Extraocular muscles intact.  HEENT: Head atraumatic, normocephalic. Oropharynx and nasopharynx clear.  NECK:  Supple, no jugular venous distention. No thyroid enlargement, no tenderness.  LUNGS: Normal breath sounds bilaterally, no wheezing, rales,rhonchi or crepitation. No use of accessory muscles of respiration. Decreased bibasilar breath sounds CARDIOVASCULAR: S1, S2 normal. No  rubs, or gallops. 2/6 systolic murmur is present ABDOMEN: Soft, nontender, nondistended. Bowel sounds present. No organomegaly or mass.  EXTREMITIES: Right leg is abducted and externally rotated. No pedal edema, cyanosis, or clubbing.  NEUROLOGIC: Cranial nerves II through XII are intact. Muscle strength 5/5 in all extremities. Sensation intact. Gait not checked.  PSYCHIATRIC: The patient is alert and oriented x 3. Easily reoriented SKIN: No obvious rash, lesion, or ulcer.    LABORATORY PANEL:   CBC  Recent Labs Lab 08/19/17 0346  WBC 13.2*  HGB 10.4*  HCT 30.9*  PLT 213   ------------------------------------------------------------------------------------------------------------------  Chemistries   Recent Labs Lab 08/19/17 0346  NA 132*  K 4.3  CL 106  CO2 22  GLUCOSE 125*  BUN 28*  CREATININE 1.32*  CALCIUM 8.3*   ------------------------------------------------------------------------------------------------------------------  Cardiac Enzymes No results for input(s): TROPONINI in the last 168 hours. ------------------------------------------------------------------------------------------------------------------  RADIOLOGY:  Ct Head Wo Contrast  Result Date: 08/17/2017 CLINICAL DATA:  Patient does not pick up her feet when she walks. Tripped over the rock. Right hip pain. Right hip fracture. EXAM: CT HEAD WITHOUT CONTRAST TECHNIQUE: Contiguous axial images were obtained from  the base of the skull through the vertex without intravenous contrast. COMPARISON:  12/05/2011 FINDINGS: Brain: Diffuse cerebral atrophy.  Mild ventricular dilatation consistent with central atrophy. Low-attenuation changes in the deep white matter consistent with small vessel ischemia. Basal ganglia calcifications. No mass effect or midline shift. No abnormal extra-axial fluid collections. Gray-white matter junctions are distinct. Basal cisterns are not effaced. No acute intracranial hemorrhage. Vascular: Vascular calcifications are present in the internal carotid and vertebrobasilar vessels. Skull: Calvarium appears intact. No acute depressed skull fractures. Focal lucency in the left anterior frontal region is unchanged since previous study and probably represents a hemangioma. Sinuses/Orbits: Acute appearing nasal bone fractures. Paranasal sinuses are clear. Mastoid air cells are clear. Other: None. IMPRESSION: 1. No acute intracranial abnormalities. 2. Chronic atrophy and small vessel ischemic changes. 3. Nasal bone fractures with mild depression. 4. Vascular calcifications. Electronically Signed   By: Lucienne Capers M.D.   On: 08/17/2017 21:21   Dg Chest Portable 1 View  Result Date: 08/17/2017 CLINICAL DATA:  Preoperative evaluation, known right femoral fracture EXAM: PORTABLE CHEST 1 VIEW COMPARISON:  09/25/2015 FINDINGS: Cardiac shadow is within normal limits. Mild aortic calcifications are seen. Hyperinflation of the lungs is noted. No acute infiltrate or sizable effusion is seen. No bony abnormality is noted. IMPRESSION: COPD without acute abnormality. Electronically Signed   By: Inez Catalina M.D.   On: 08/17/2017 19:27   Dg Hip Unilat  With Pelvis 2-3 Views Right  Result Date: 08/17/2017 CLINICAL DATA:  Recent fall with right leg pain, initial encounter EXAM: DG HIP (WITH OR WITHOUT PELVIS) 2-3V RIGHT COMPARISON:  None. FINDINGS: Pelvic ring is intact. There is an intratrochanteric right  femoral fracture with some impaction and angulation at the fracture site. No gross soft tissue abnormality is seen. No dislocation is noted. IMPRESSION: Right intratrochanteric femoral fracture. Electronically Signed   By: Inez Catalina M.D.   On: 08/17/2017 19:25    EKG:   Orders placed or performed during the hospital encounter of 08/17/17  . ED EKG  . ED EKG  . EKG 12-Lead  . EKG 12-Lead    ASSESSMENT AND PLAN:   83 year old female with past medical history significant for CAD status post LAD stent in 2014, ischemic cardiac myopathy with last bony of 440-45%, paroxysmal atrial fibrillation not on anticoagulation, history of SVT presents to the hospital secondary to fall and right femoral intertrochanteric fracture.  #1 right femoral intertrochanteric fracture-appreciate orthopedic consult. -for ORIF today -Continue pain control. DVT prophylaxis and physical therapy will be started after surgery -Continue Foley catheter. -Patient okay to proceed with surgery, no recent cardiac symptoms. Continue carvedilol in the perioperative period  #2 CAD status post stent-also known ischemic cardiomyopathy with EF of 40-45% -Aspirin and Brilinta on hold. LAD stent in 2014. Continue carvedilol -Lasix on hold as well compensated at this time.  #3 anemia of chronic disease- on iron supplements. Monitor hemoglobin after surgery  #4 DVT prophylaxis - will be started after surgery   Updated daughter at bedside   All the records are reviewed and case discussed with Care Management/Social Workerr. Management plans discussed with the patient, family and they are in agreement.  CODE STATUS: Full code  TOTAL TIME TAKING CARE OF THIS PATIENT: 34 minutes.   POSSIBLE D/C IN 2 DAYS, DEPENDING ON CLINICAL CONDITION.   Gladstone Lighter M.D on 08/19/2017 at 12:20 PM  Between 7am to 6pm - Pager - [phone]  After 6pm go to [email or web address] - password [email or web address] [facility]    Office  [phone]  CC: Primary care physician; Leone Haven, MD

## 2017-08-19 NOTE — Anesthesia Post-op Follow-up Note (Signed)
Anesthesia QCDR form completed.        

## 2017-08-19 NOTE — Telephone Encounter (Signed)
Spoke with patients daughter per release form and she just wanted to make sure Dr. Rockey Situ was aware she was in the hospital and having surgery. She reports that she was on Brilinta and just wanted [place] to know about her surgery today. Let her know that I would make them aware and I would let them know she is in the hospital.

## 2017-08-19 NOTE — Clinical Social Work Placement (Signed)
   CLINICAL SOCIAL WORK PLACEMENT  NOTE  Date:  08/19/2017  Patient Details  Name: Linda Hart MRN: [id number] Date of Birth: Jul 24, 1925  Clinical Social Work is seeking post-discharge placement for this patient at the [place] level of care (*CSW will initial, date and re-position this form in  chart as items are completed):  Yes   Patient/family provided with [facility] Work Department's list of facilities offering this level of care within the geographic area requested by the patient (or if unable, by the patient's family).  Yes   Patient/family informed of their freedom to choose among providers that offer the needed level of care, that participate in Medicare, Medicaid or managed care program needed by the patient, have an available bed and are willing to accept the patient.  Yes   Patient/family informed of [facility]'s ownership interest in [facility] and [facility], as well as of the fact that they are under no obligation to receive care at these facilities.  PASRR submitted to EDS on       PASRR number received on       Existing PASRR number confirmed on 08/19/17     FL2 transmitted to all facilities in geographic area requested by pt/family on 08/19/17     FL2 transmitted to all facilities within larger geographic area on       Patient informed that his/her managed care company has contracts with or will negotiate with certain facilities, including the following:        Yes   Patient/family informed of bed offers received.  Patient chooses bed at  [facility] )     Physician recommends and patient chooses bed at      Patient to be transferred to   on  .  Patient to be transferred to facility by       Patient family notified on   of transfer.  Name of family member notified:        PHYSICIAN       Additional Comment:    _______________________________________________ Linda Hart, Linda Beets, LCSW 08/19/2017,  3:02 PM

## 2017-08-19 NOTE — Telephone Encounter (Signed)
Pt daughter calling to just let us know patient had a fall Saturday and broke her hip She is doing procedure tomorrow Just wanted to know if we had any recommendation since patient was on blood thinners Please advise

## 2017-08-19 NOTE — Anesthesia Preprocedure Evaluation (Addendum)
Anesthesia Evaluation  Patient identified by MRN, date of birth, ID band Patient awake and Patient confused    Reviewed: Allergy & Precautions, H&P , NPO status , Patient's Chart, lab work & pertinent test results  Airway Mallampati: III  TM Distance: <3 FB Neck ROM: limited    Dental  (+) Poor Dentition, Chipped, Missing   Pulmonary shortness of breath and with exertion,           Cardiovascular Exercise Tolerance: Poor hypertension, (-) angina+ CAD, + Past MI, + Cardiac Stents and +CHF       Neuro/Psych negative neurological ROS  negative psych ROS   GI/Hepatic Neg liver ROS, GERD  Medicated and Controlled,  Endo/Other  negative endocrine ROS  Renal/GU      Musculoskeletal   Abdominal   Peds  Hematology negative hematology ROS (+)   Anesthesia Other Findings Past Medical History: 03/06/13: Coronary artery disease     Comment:  90% prox LAD stenosis s/p DES No date: Dizziness No date: GERD (gastroesophageal reflux disease) No date: Hypotension 03/09/13: Ischemic cardiomyopathy     Comment:  s/p NSTEMI. EF 30-35%. No date: MI (myocardial infarction) ([place] [facility]) 03/06/13: PAF (paroxysmal atrial fibrillation) ([facility])     Comment:  In setting of NSTEMI and reperfusion, no recurrence No date: SVT (supraventricular tachycardia) ([place])     Comment:  Self-limited on outpatient cardiac monitor  Past Surgical History: No date: CESAREAN SECTION 02/2013: CORONARY ANGIOPLASTY WITH STENT PLACEMENT     Comment:  DES-mid LAD, mild LCx, RCA stenoses  BMI    Body Mass Index:  19.14 kg/m      Reproductive/Obstetrics negative OB ROS                            Anesthesia Physical Anesthesia Plan  ASA: III  Anesthesia Plan: General ETT   Post-op Pain Management:    Induction: Intravenous  PONV Risk Score and Plan: 3 and Ondansetron, Dexamethasone and Treatment may vary due to age or medical  condition  Airway Management Planned: Oral ETT  Additional Equipment:   Intra-op Plan:   Post-operative Plan: Extubation in OR  Informed Consent: I have reviewed the patients History and Physical, chart, labs and discussed the procedure including the risks, benefits and alternatives for the proposed anesthesia with the patient or authorized representative who has indicated his/her understanding and acceptance.   Dental Advisory Given  Plan Discussed with: Anesthesiologist, CRNA and Surgeon  Anesthesia Plan Comments: (Patient and family consented Patient last had Brilinta 2 days ago so plan for GA over spinal Plan to suspend DNR for procedure.  Patient and family voiced understanding.   Patient and family informed that patient is higher risk for complications from anesthesia during this procedure due to their medical history and age including but not limited to post operative cognitive dysfunction.  They voiced understanding.   Patient and family consented for risks of anesthesia including but not limited to:  - adverse reactions to medications - damage to teeth, lips or other oral mucosa - sore throat or hoarseness - Damage to heart, brain, lungs or loss of life  They voiced understanding.)      Anesthesia Quick Evaluation

## 2017-08-19 NOTE — Op Note (Signed)
08/17/2017 - 08/19/2017  3:25 PM  PATIENT:  Linda Hart  83 y.o. female  PRE-OPERATIVE DIAGNOSIS:  right intertrochanteric hip fracture   POST-OPERATIVE DIAGNOSIS:  right intertrochanteric hip fracture   PROCEDURE:  Procedure(s): INTRAMEDULLARY (IM) NAIL INTERTROCHANTRIC (Right)  SURGEON: Laurene Footman, MD  ASSISTANTS: None  ANESTHESIA:   general  EBL:  Total I/O In: 400 [I.V.:400] Out: 100 [Blood:100]  BLOOD ADMINISTERED:none  DRAINS: none   LOCAL MEDICATIONS USED:  NONE  SPECIMEN:  No Specimen  DISPOSITION OF SPECIMEN:  N/A  COUNTS:  YES  TOURNIQUET:  * No tourniquets in log *  IMPLANTS: Biomet 11 x 1 30 short nail with 95 mm lag screw 36 mm distal interlocking screw  DICTATION: .Dragon Dictation Patient brought the operating room and after adequate general anesthesia was obtained the patient was placed on the fracture table with the left leg in the well-leg holder right foot in the traction boot traction and internal rotation applied. C-arm was brought in and anatomic alignment was obtained*the case. After prepping draping the sterile fashion appropriate patient identification and timeout procedures were completed. A small incision was made proximal to the greater trochanter guide were inserted into the greater trochanter followed by proximal reaming and placement of the 11 mm rod the femur required reaming with 13 mm reamer because of a lateral bone over the femur after inserting the rod guidewires inserted center center in the head measurement made followed by drilling placement of a 95 mm lag screw followed by compression to the compression device after traction was released with the foot. The proximal set screw was tightened and a quarter turn loosening to allow for compression and the distal interlocking screw was then placed with drilling and placing the 5.0 cortical screw at this point the insertion handle was removed and permanent AP lateral images obtained.  Wounds were irrigated and closed with #1 Vicryl for the deep fascia proximally 2-0 Vicryl subcutaneously and skin staples followed by Xeroform and honeycomb dressing  PLAN OF CARE: continue as inpatient  PATIENT DISPOSITION:  PACU - hemodynamically stable.

## 2017-08-19 NOTE — Plan of Care (Signed)
Patient IV patent.  Consents x2 signed and on chart.  Educated on process and daughter informed.  NPO since midnight, Coreg given per protocol - all other meds not given.  Removed all jewelry and personal items. CHG x2 completed. Report called to OR. Vitals stable.

## 2017-08-19 NOTE — Transfer of Care (Signed)
Immediate Anesthesia Transfer of Care Note  Patient: Linda Hart  Procedure(s) Performed: INTRAMEDULLARY (IM) NAIL INTERTROCHANTRIC (Right )  Patient Location: PACU  Anesthesia Type:General  Level of Consciousness: sedated and responds to stimulation  Airway & Oxygen Therapy: Patient Spontanous Breathing and Patient connected to face mask oxygen  Post-op Assessment: Report given to RN and Post -op Vital signs reviewed and stable  Post vital signs: Reviewed and stable  Last Vitals:  Vitals:   08/19/17 1522 08/19/17 1523  BP: (!) 144/44 (!) 147/44  Pulse: 77 74  Resp: 16 (!) 21  Temp: 37.2 C   SpO2: 100% 100%    Last Pain:  Vitals:   08/19/17 1522  TempSrc:   PainSc: Asleep         Complications: No apparent anesthesia complications

## 2017-08-19 NOTE — Clinical Social Work Note (Signed)
Clinical Social Work Assessment  Patient Details  Name: Linda Hart MRN: [id number] Date of Birth: 12/12/1924  Date of referral:  08/19/17               Reason for consult:  Facility Placement                Permission sought to share information with:  Chartered certified accountant granted to share information::  Yes, Verbal Permission Granted  Name::      Linda Hart::   Linda Hart   Relationship::     Contact Information:     Housing/Transportation Living arrangements for the past 2 months:  Charity fundraiser of Information:  Patient Patient Interpreter Needed:  None Criminal Activity/Legal Involvement Pertinent to Current Situation/Hospitalization:  No - Comment as needed Significant Relationships:  Adult Children Lives with:  Self Do you feel safe going back to the place where you live?  Yes Need for family participation in patient care:  Yes (Comment)  Care giving concerns:  Patient lives at the independent living section at [facility].    Social Worker assessment / plan:  Holiday representative (CSW) reviewed chart and noted that patient will have surgery today for a hip fracture. CSW met with patient alone at bedside prior to surgery today. Patient was alert and oriented X4 and was laying in the bed. Patient reported that she [facility] [place] [facility] and is planning on going to [facility] for short term rehab. CSW explained that medicare requires a 3 night qualifying inpatient stay in a hospital in order to pay for SNF. Patient was admitted to inpatient on 08/17/17. Patient verbalized her understanding and is agreeable to go to [name]. Per [facility] admissions coordinator at [facility] patient can come to [facility] when stable. CSW contacted patient's daughter Linda Hart and made her aware of above. Daughter is in agreement with plan. FL2 complete and faxed out.   Patient  will D/C to [name] for short term rehab when stable. CSW will continue to follow and assist as needed.   Employment status:  Retired Forensic scientist:  Medicare PT Recommendations:  Not assessed at this time [place] / Referral to community resources:  [place]  Patient/Family's Response to care:  Patient is agreeable to D/C to [name]   Patient/Family's Understanding of and Emotional Response to Diagnosis, Current Treatment, and Prognosis:  Patient was very pleasant and thanked CSW for assistance.   Emotional Assessment Appearance:  Appears stated age Attitude/Demeanor/Rapport:    Affect (typically observed):  Accepting, Adaptable, Pleasant Orientation:  Oriented to Self, Oriented to  Time, Oriented to Place, Oriented to Situation Alcohol / Substance use:  Not Applicable Psych involvement (Current and /or in the community):  No (Comment)  Discharge Needs  Concerns to be addressed:  Discharge Planning Concerns Readmission within the last 30 days:  No Current discharge risk:  Dependent with Mobility Barriers to Discharge:  Continued Medical Work up   [employer], Linda Beets, LCSW 08/19/2017, 3:04 PM

## 2017-08-19 NOTE — Anesthesia Procedure Notes (Signed)
Procedure Name: Intubation Performed by: Lance Muss Pre-anesthesia Checklist: Patient identified, Patient being monitored, Timeout performed, Emergency Drugs available and Suction available Patient Re-evaluated:Patient Re-evaluated prior to induction Oxygen Delivery Method: Circle system utilized Preoxygenation: Pre-oxygenation with 100% oxygen Induction Type: IV induction Ventilation: Mask ventilation without difficulty Laryngoscope Size: 3 and McGraph Grade View: Grade I Tube type: Oral Tube size: 7.0 mm Number of attempts: 1 Airway Equipment and Method: Stylet Placement Confirmation: ETT inserted through vocal cords under direct vision,  positive ETCO2 and breath sounds checked- equal and bilateral Secured at: 21 cm Tube secured with: Tape Dental Injury: Teeth and Oropharynx as per pre-operative assessment  Difficulty Due To: Difficult Airway- due to anterior larynx

## 2017-08-20 ENCOUNTER — Encounter
Admission: RE | Admit: 2017-08-20 | Discharge: 2017-08-20 | Disposition: A | Discharge status: Home / Self Care | Payer: Medicare Other | Source: Ambulatory Visit | Attending: Internal Medicine | Admitting: Internal Medicine

## 2017-08-20 ENCOUNTER — Encounter: Payer: Self-pay | Admitting: Orthopedic Surgery

## 2017-08-20 LAB — URINALYSIS, COMPLETE (UACMP) WITH MICROSCOPIC
BILIRUBIN URINE: NEGATIVE
Glucose, UA: NEGATIVE mg/dL
KETONES UR: 5 mg/dL — AB
Nitrite: NEGATIVE
Protein, ur: NEGATIVE mg/dL
Specific Gravity, Urine: 1.02 (ref 1.005–1.030)
pH: 5 (ref 5.0–8.0)

## 2017-08-20 LAB — CBC
HCT: 26.3 % — ABNORMAL LOW (ref 35.0–47.0)
Hemoglobin: 8.6 g/dL — ABNORMAL LOW (ref 12.0–16.0)
MCH: 30.7 pg (ref 26.0–34.0)
MCHC: 32.9 g/dL (ref 32.0–36.0)
MCV: 93.6 fL (ref 80.0–100.0)
PLATELETS: 191 10*3/uL (ref 150–440)
RBC: 2.81 MIL/uL — ABNORMAL LOW (ref 3.80–5.20)
RDW: 14.4 % (ref 11.5–14.5)
WBC: 14.2 10*3/uL — ABNORMAL HIGH (ref 3.6–11.0)

## 2017-08-20 LAB — BASIC METABOLIC PANEL
Anion gap: 10 (ref 5–15)
BUN: 33 mg/dL — AB (ref 6–20)
CO2: 19 mmol/L — ABNORMAL LOW (ref 22–32)
CREATININE: 1.54 mg/dL — AB (ref 0.44–1.00)
Calcium: 7.6 mg/dL — ABNORMAL LOW (ref 8.9–10.3)
Chloride: 105 mmol/L (ref 101–111)
GFR calc Af Amer: 33 mL/min — ABNORMAL LOW (ref >60)
GFR, EST NON AFRICAN AMERICAN: 29 mL/min — AB (ref >60)
Glucose, Bld: 117 mg/dL — ABNORMAL HIGH (ref 65–99)
Potassium: 5 mmol/L (ref 3.5–5.1)
SODIUM: 134 mmol/L — AB (ref 135–145)

## 2017-08-20 MED ORDER — FE FUMARATE-B12-VIT C-FA-IFC PO CAPS: 1.0000 | ORAL_CAPSULE | Freq: Two times a day (BID) | ORAL | Status: DC

## 2017-08-20 MED ORDER — DEXTROSE 5 % IV SOLN
1.0000 g | INTRAVENOUS | Status: DC
  Administered 2017-08-20 – 2017-08-21 (×2): 1 g via INTRAVENOUS
  Filled 2017-08-20 (×3): qty 10

## 2017-08-20 NOTE — Progress Notes (Signed)
PT is recommending SNF. Social work Theatre manager met patient and confirmed that plan is for her to D/C to [facility] for short term rehab. Patient confirmed this plan. [facility] admissions coordinator at [facility] is aware of above. Clinical Social Worker (CSW) will continue to follow and assist as needed.   [employer], LCSW [phone]

## 2017-08-20 NOTE — Progress Notes (Signed)
Physical Therapy Treatment Patient Details Name: Linda Hart MRN: [id number] DOB: 1925-07-30 Today's Date: 08/20/2017    History of Present Illness s/p R IM nailing on 10/22 for a closed R hip fracture. PMH of CAD s/p stent, GERD, paroxysmal afib, SVT.    PT Comments    Pt is making good progress towards goals. Pt performed there-ex, bed mobility/transfers/amb with RW with min assist. Pt amb a total of 24ft in room, with one seated rest break, demonstrates improved technique with sequencing of steps. Pt unable to amb further due to fatigue and decreased endurance, appeared very tired at end of session, however was motivated to participate in all activities.    Follow Up Recommendations  SNF     Equipment Recommendations  Rolling walker with 5" wheels    Recommendations for Other Services       Precautions / Restrictions Precautions Precautions: Fall Restrictions Weight Bearing Restrictions: Yes RLE Weight Bearing: Weight bearing as tolerated    Mobility  Bed Mobility Overal bed mobility: Needs Assistance Bed Mobility: Sit to Supine       Sit to supine: Min assist   General bed mobility comments: Cues for proper technique, min assist to guide LEs onto bed, pt utilized bed rails for support, able to scoot to midline  Transfers Overall transfer level: Needs assistance Equipment used: Rolling walker (2 wheeled) Transfers: Sit to/from Stand Sit to Stand: Min assist         General transfer comment: Cues for proper hand and feet placement and to lean trunk forward, slow rise to full standing, cues for upright posture  Ambulation/Gait Ambulation/Gait assistance: Min assist Ambulation Distance (Feet): 15 Feet Assistive device: Rolling walker (2 wheeled) Gait Pattern/deviations: Step-to pattern     General Gait Details: Continuous cues for proper sequencing of RW and steps, demonstrates carryover from morning session. Required one seated rest break due to  fatigue.    Stairs            Wheelchair Mobility    Modified Rankin (Stroke Patients Only)       Balance Overall balance assessment: Needs assistance Sitting-balance support: Feet supported Sitting balance-Leahy Scale: Poor Sitting balance - Comments: Pt requires hands on arm rests to maintain upright seated position due to decreased trunk endurance.    Standing balance support: Bilateral upper extremity supported Standing balance-Leahy Scale: Fair Standing balance comment: Relies on RW for support, fatigues quickly in standing position, no LOB, cues for upright posture                            Cognition Arousal/Alertness: Awake/alert Behavior During Therapy: WFL for tasks assessed/performed Overall Cognitive Status: Within Functional Limits for tasks assessed                                        Exercises Other Exercises Other Exercises: Ther-ex 12x, ankle pumps, SLRs, quad sets, SAQs. Min assist to initiate exercises on RLE, cues for proper techniqu    General Comments        Pertinent Vitals/Pain Pain Assessment: 0-10 Pain Score: 3  Pain Location: R hip  Pain Intervention(s): Limited activity within patient's tolerance;Monitored during session;Repositioned    Home Living                      Prior Function  PT Goals (current goals can now be found in the care plan section) Acute Rehab PT Goals Patient Stated Goal: to get stronger PT Goal Formulation: With patient Time For Goal Achievement: 09/03/17 Potential to Achieve Goals: Good Progress towards PT goals: Progressing toward goals    Frequency    BID      PT Plan Current plan remains appropriate    Co-evaluation              AM-PAC PT "6 Clicks" Daily Activity  Outcome Measure  Difficulty turning over in bed (including adjusting bedclothes, sheets and blankets)?: Unable Difficulty moving from lying on back to sitting on the side  of the bed? : Unable Difficulty sitting down on and standing up from a chair with arms (e.g., wheelchair, bedside commode, etc,.)?: Unable Help needed moving to and from a bed to chair (including a wheelchair)?: A Little Help needed walking in hospital room?: A Little Help needed climbing 3-5 steps with a railing? : A Lot 6 Click Score: 11    End of Session Equipment Utilized During Treatment: Gait belt Activity Tolerance: Patient tolerated treatment well;Patient limited by fatigue Patient left: in bed;with bed alarm set;with family/visitor present   PT Visit Diagnosis: Other abnormalities of gait and mobility (R26.89);Muscle weakness (generalized) (M62.81)     Time: 9179-1505 PT Time Calculation (min) (ACUTE ONLY): 30 min  Charges:                       G Codes:  Functional Assessment Tool Used: AM-PAC 6 Clicks Basic Mobility Functional Limitation: Mobility: Walking and moving around Mobility: Walking and Moving Around Current Status ([id number]): At least 60 percent but less than 80 percent impaired, limited or restricted Mobility: Walking and Moving Around Goal Status [phone]): At least 40 percent but less than 60 percent impaired, limited or restricted    Manfred Arch, SPT   Manfred Arch 08/20/2017, 4:00 PM

## 2017-08-20 NOTE — Progress Notes (Addendum)
Pharmacy Antibiotic Note  Linda Hart is a 83 y.o. female admitted on 08/17/2017 now with UTI.  Pharmacy has been consulted for Ceftriaxone dosing. PCN allergy noted in chart in 2011. Ceftazidime 1 g IV x1 given per [facility] in 09/23/2015. RN asked pt if she remembers reaction, pt stated she may have thrown up, she cannot remember very well, it happened in her 78s. Discussed with MD.   Plan: Ceftriaxone 1 g IV q24h  Height: 5' (152.4 cm) Weight: 98 lb (44.5 kg) IBW/kg (Calculated) : 45.5  Temp (24hrs), Avg:98.2 F (36.8 C), Min:97.4 F (36.3 C), Max:99 F (37.2 C)   Recent Labs Lab 08/17/17 1927 08/18/17 0327 08/19/17 0346 08/19/17 1915 08/20/17 0527  WBC 9.3 14.1* 13.2* 19.6* 14.2*  CREATININE 1.29* 1.13* 1.32*  --  1.54*    Estimated Creatinine Clearance: 17.4 mL/min (A) (by C-G formula based on SCr of 1.54 mg/dL (H)).    Allergies  Allergen Reactions  . Penicillins     Has patient had a PCN reaction causing immediate rash, facial/tongue/throat swelling, SOB or lightheadedness with hypotension: Unknown Has patient had a PCN reaction causing severe rash involving mucus membranes or skin necrosis: Unknown Has patient had a PCN reaction that required hospitalization No Has patient had a PCN reaction occurring within the last 10 years: No If all of the above answers are "NO", then may proceed with Cephalosporin use.      Thank you for allowing pharmacy to be a part of this patient's care.  Rayna Sexton L 08/20/2017 3:00 PM

## 2017-08-20 NOTE — Anesthesia Postprocedure Evaluation (Signed)
Anesthesia Post Note  Patient: [name]  Procedure(s) Performed: INTRAMEDULLARY (IM) NAIL INTERTROCHANTRIC (Right )  Patient location during evaluation: PACU Anesthesia Type: General Level of consciousness: awake and alert Pain management: pain level controlled Vital Signs Assessment: post-procedure vital signs reviewed and stable Respiratory status: spontaneous breathing, nonlabored ventilation, respiratory function stable and patient connected to nasal cannula oxygen Cardiovascular status: blood pressure returned to baseline and stable Postop Assessment: no apparent nausea or vomiting Anesthetic complications: no     Last Vitals:  Vitals:   08/19/17 2226 08/20/17 0400  BP: (!) 128/47 (!) 148/37  Pulse: 88 90  Resp: 17 18  Temp: 36.4 C 36.4 C  SpO2: 100% 98%    Last Pain:  Vitals:   08/20/17 0400  TempSrc: Oral  PainSc:                  Precious Haws Piscitello

## 2017-08-20 NOTE — Progress Notes (Signed)
[facility] NAME: Linda Hart    MR#:  [id number]  DATE OF BIRTH:  1925-03-30  SUBJECTIVE:  CHIEF COMPLAINT:   Chief Complaint  Patient presents with  . Fall   -Right femoral intertrochanteric fracture status post surgery, postoperative day 1 today. Complaints of pain at the site and discomfort. -Physical therapy to work with her today  REVIEW OF SYSTEMS:  Review of Systems  Constitutional: Negative for chills, fever and malaise/fatigue.  HENT: Positive for hearing loss. Negative for congestion, ear discharge and nosebleeds.   Eyes: Negative for blurred vision and double vision.  Respiratory: Negative for cough, shortness of breath and wheezing.   Cardiovascular: Negative for chest pain, palpitations and leg swelling.  Gastrointestinal: Negative for abdominal pain, constipation, diarrhea, nausea and vomiting.  Genitourinary: Negative for dysuria.  Musculoskeletal: Positive for joint pain and myalgias.  Neurological: Negative for dizziness, speech change, focal weakness, seizures and headaches.  Psychiatric/Behavioral: Negative for depression.    DRUG ALLERGIES:   Allergies  Allergen Reactions  . Penicillins     Has patient had a PCN reaction causing immediate rash, facial/tongue/throat swelling, SOB or lightheadedness with hypotension: Unknown Has patient had a PCN reaction causing severe rash involving mucus membranes or skin necrosis: Unknown Has patient had a PCN reaction that required hospitalization No Has patient had a PCN reaction occurring within the last 10 years: No If all of the above answers are "NO", then may proceed with Cephalosporin use.    VITALS:  Blood pressure 140/72, pulse 96, temperature (!) 97.5 F (36.4 C), temperature source Oral, resp. rate 20, height 5' (1.524 m), weight 44.5 kg (98 lb), SpO2 99 %.  PHYSICAL EXAMINATION:  Physical Exam  GENERAL:  83 y.o.-year-old elderly patient  lying in the bed with no acute distress.  EYES: Pupils equal, round, reactive to light and accommodation. No scleral icterus. Extraocular muscles intact.  HEENT: Head atraumatic, normocephalic. Oropharynx and nasopharynx clear.  NECK:  Supple, no jugular venous distention. No thyroid enlargement, no tenderness.  LUNGS: Normal breath sounds bilaterally, no wheezing, rales,rhonchi or crepitation. No use of accessory muscles of respiration. Decreased bibasilar breath sounds CARDIOVASCULAR: S1, S2 normal. No  rubs, or gallops. 2/6 systolic murmur is present ABDOMEN: Soft, nontender, nondistended. Bowel sounds present. No organomegaly or mass.  EXTREMITIES: Right lateral thigh dressing and minimal swelling noted.. No pedal edema, cyanosis, or clubbing.  NEUROLOGIC: Cranial nerves II through XII are intact. Muscle strength 5/5 in all extremities. Sensation intact. Gait not checked.  PSYCHIATRIC: The patient is alert and oriented x 3.  SKIN: No obvious rash, lesion, or ulcer.    LABORATORY PANEL:   CBC  Recent Labs Lab 08/20/17 0527  WBC 14.2*  HGB 8.6*  HCT 26.3*  PLT 191   ------------------------------------------------------------------------------------------------------------------  Chemistries   Recent Labs Lab 08/20/17 0527  NA 134*  K 5.0  CL 105  CO2 19*  GLUCOSE 117*  BUN 33*  CREATININE 1.54*  CALCIUM 7.6*   ------------------------------------------------------------------------------------------------------------------  Cardiac Enzymes No results for input(s): TROPONINI in the last 168 hours. ------------------------------------------------------------------------------------------------------------------  RADIOLOGY:  Dg Hip Operative Unilat W Or W/o Pelvis Right  Result Date: 08/19/2017 CLINICAL DATA:  Intertrochanteric fracture of the proximal right femur. EXAM: OPERATIVE RIGHT HIP (WITH PELVIS IF PERFORMED) 2 VIEWS TECHNIQUE: Fluoroscopic spot image(s)  were submitted for interpretation post-operatively. COMPARISON:  RADIOGRAPHS DATED 08/17/2017 FINDINGS: AP and lateral C-arm images demonstrate the patient has undergone open reduction and internal  fixation of the intertrochanteric fracture. Intramedullary nail and lag screw in place, appear in good position. IMPRESSION: Open reduction and internal fixation of intertrochanteric fracture of the proximal right femur. Electronically Signed   By: Lorriane Shire M.D.   On: 08/19/2017 15:33    EKG:   Orders placed or performed during the hospital encounter of 08/17/17  . ED EKG  . ED EKG  . EKG 12-Lead  . EKG 12-Lead    ASSESSMENT AND PLAN:   83 year old female with past medical history significant for CAD status post LAD stent in 2014, ischemic cardiac myopathy with last bony of 440-45%, paroxysmal atrial fibrillation not on anticoagulation, history of SVT presents to the hospital secondary to fall and right femoral intertrochanteric fracture.  #1 right femoral intertrochanteric fracture-appreciate orthopedic consult. -s/p ORIF POD #1 today -Continue pain control. DVT prophylaxis lovenox, - physical therapy will be started today -d/c Foley catheter.  #2 CAD status post stent-also known ischemic cardiomyopathy with EF of 40-45% -Aspirin and Brilinta can be restarted at discharge. LAD stent in 2014. Continue carvedilol -Lasix on hold as well compensated at this time. Can be restarted at discharge  #3 anemia of chronic disease- on iron supplements. Monitor hemoglobin after surgery - slight drop noted, no indication for Tx yet  #4 DVT prophylaxis - lovenox for 2 weeks   Physical therapy consult today   All the records are reviewed and case discussed with Care Management/Social Workerr. Management plans discussed with the patient, family and they are in agreement.  CODE STATUS: Full code  TOTAL TIME TAKING CARE OF THIS PATIENT: 34 minutes.   POSSIBLE D/C IN 1-2 DAYS, DEPENDING ON  CLINICAL CONDITION.   Gladstone Lighter M.D on 08/20/2017 at 9:22 AM  Between 7am to 6pm - Pager - [phone]  After 6pm go to [email or web address] - password [email or web address] [facility]  Office  [phone]  CC: Primary care physician; Leone Haven, MD

## 2017-08-20 NOTE — Progress Notes (Signed)
Physical Therapy Evaluation Patient Details Name: Linda Hart MRN: [id number] DOB: Jul 20, 1925 Today's Date: 08/20/2017   History of Present Illness  s/p R IM nailing on 10/22 for a closed R hip fracture. PMH of CAD s/p stent, GERD, paroxysmal afib, SVT.  Clinical Impression  Pt is a pleasant 83 year old female s/p R IM nailing of R hip. Pt educated on WB status, performed supine there-ex, bed mobility with mod assist, transfers/amb with RW with min assist. Pt able to roll onto both sides to perform toileting tasks with bedpan and to change dressings. Pt amb 2 ft to recliner, able to WB on RLE to take short steps, requires continuous cues for proper step sequencing. Plan to amb further this afternoon. Monitored O2 sats on room air, 90-95% on room air, RN notified. Pt demonstrates deficits with strength/endurance/bed mobility/transfers/amb. Would benefit from further skilled PT services to address above deficits and promote optimal return to home. Recommend transition to SNF upon DC from acute hospitalization.     Follow Up Recommendations SNF    Equipment Recommendations  Rolling walker with 5" wheels    Recommendations for Other Services       Precautions / Restrictions Precautions Precautions: Fall Restrictions Weight Bearing Restrictions: Yes RLE Weight Bearing: Weight bearing as tolerated      Mobility  Bed Mobility Overal bed mobility: Needs Assistance Bed Mobility: Supine to Sit;Rolling Rolling: Min assist   Supine to sit: Mod assist     General bed mobility comments: Cues for proper technique, pt utilizes bed rail, requires assist guide shoulders off bed, able to scoot to seated EOB, holds onto bed rail to maintain seated balance, assist to guide LEs off bed. Pt able to roll to each side, uses rails for support  Transfers Overall transfer level: Needs assistance Equipment used: Rolling walker (2 wheeled) Transfers: Sit to/from Stand Sit to Stand: Min assist          General transfer comment: Cues for proper hand and feet placement, cues to lean trunk forward, additional time to rise to full standing position, able to WB through RLE in standing  Ambulation/Gait Ambulation/Gait assistance: Min assist Ambulation Distance (Feet): 2 Feet Assistive device: Rolling walker (2 wheeled) Gait Pattern/deviations: Step-to pattern     General Gait Details: Continuous cues for proper sequencing of RW and steps, amb slowly but able to WB through RLE to take small steps  Stairs            Wheelchair Mobility    Modified Rankin (Stroke Patients Only)       Balance Overall balance assessment: Needs assistance Sitting-balance support: Feet supported Sitting balance-Leahy Scale: Poor Sitting balance - Comments: Pt requires hands on EOB or bed rail to maintain seated position, unable to lift hands off bed to put on gait belt around patient   Standing balance support: Bilateral upper extremity supported Standing balance-Leahy Scale: Fair Standing balance comment: Relies on RW for support, able to maintain standing position with no LOB, able to WB through RLE                             Pertinent Vitals/Pain Pain Assessment: 0-10 Pain Score: 2  Pain Location: R hip  Pain Intervention(s): Limited activity within patient's tolerance;Monitored during session;Repositioned    Home Living Family/patient expects to be discharged to:: Assisted living               Home Equipment: Other (comment) (  walking stick) Additional Comments: Pt reports occasionally using walking stick when walking to/from cafeteria which is down the hall.     Prior Function Level of Independence: Needs assistance         Comments: Pt reports being independent with ADLs at ALF, enjoys playing board games, does not do physical activity      Hand Dominance        Extremity/Trunk Assessment   Upper Extremity Assessment Upper Extremity Assessment:  Generalized weakness (UE MMT grossly 4-/5)    Lower Extremity Assessment Lower Extremity Assessment: Generalized weakness (LLE MMT grossly 4-/5)    Cervical / Trunk Assessment Cervical / Trunk Assessment: Normal  Communication   Communication: HOH;No difficulties  Cognition Arousal/Alertness: Awake/alert Behavior During Therapy: WFL for tasks assessed/performed Overall Cognitive Status: Within Functional Limits for tasks assessed                                 General Comments: Pt not very talkative, requires questions to be repeated several times       General Comments      Exercises Other Exercises Other Exercises: Supine ther-ex, 10x, ankle pumps, SLRs, hip abd/add. Cues for proper technique, min assist to perform exercises on RLE Other Exercises: Assisted nursing staff to help patient with toileting tasks using bedpan, pt able to roll onto each side and maintain position while holding onto bed rails. Cues for proper technique to bend the knees and reach with hands to roll.  Pt able to perform rolling for PA to change dressings.    Assessment/Plan    PT Assessment Patient needs continued PT services  PT Problem List Decreased strength;Decreased range of motion;Decreased activity tolerance;Decreased balance;Decreased mobility;Decreased knowledge of use of DME;Decreased knowledge of precautions       PT Treatment Interventions DME instruction;Gait training;Stair training;Functional mobility training;Therapeutic activities;Therapeutic exercise;Balance training;Patient/family education    PT Goals (Current goals can be found in the Care Plan section)  Acute Rehab PT Goals Patient Stated Goal: to get stronger PT Goal Formulation: With patient Time For Goal Achievement: 09/03/17 Potential to Achieve Goals: Good    Frequency BID   Barriers to discharge        Co-evaluation               AM-PAC PT "6 Clicks" Daily Activity  Outcome Measure  Difficulty turning over in bed (including adjusting bedclothes, sheets and blankets)?: Unable Difficulty moving from lying on back to sitting on the side of the bed? : Unable Difficulty sitting down on and standing up from a chair with arms (e.g., wheelchair, bedside commode, etc,.)?: Unable Help needed moving to and from a bed to chair (including a wheelchair)?: A Little Help needed walking in hospital room?: A Little Help needed climbing 3-5 steps with a railing? : A Lot 6 Click Score: 11    End of Session Equipment Utilized During Treatment: Gait belt Activity Tolerance: Patient tolerated treatment well Patient left: in chair;with call bell/phone within reach;with chair alarm set Nurse Communication: Mobility status PT Visit Diagnosis: Other abnormalities of gait and mobility (R26.89);Muscle weakness (generalized) (M62.81);Unsteadiness on feet (R26.81)    Time: 0930-1008 PT Time Calculation (min) (ACUTE ONLY): 38 min   Charges:         PT G Codes:   PT G-Codes **NOT FOR INPATIENT CLASS** Functional Assessment Tool Used: AM-PAC 6 Clicks Basic Mobility Functional Limitation: Mobility: Walking and moving around Mobility: Walking and  Moving Around Current Status [phone]): At least 60 percent but less than 80 percent impaired, limited or restricted Mobility: Walking and Moving Around Goal Status [phone]): At least 40 percent but less than 60 percent impaired, limited or restricted    Manfred Arch, SPT  Manfred Arch 08/20/2017, 1:08 PM

## 2017-08-20 NOTE — Progress Notes (Signed)
OT Cancellation Note  Patient Details Name: Linda Hart MRN: [id number] DOB: Nov 02, 1924   Cancelled Treatment:    Reason Eval/Treat Not Completed: Patient at procedure or test/ unavailable. Order received, chart reviewed. Pt working with PT upon attempt. Will re-attempt OT evaluation next date as pt is available and medically appropriate.  Jeni Salles, MPH, MS, OTR/L ascom [phone] 08/20/17, 1:43 PM

## 2017-08-20 NOTE — Progress Notes (Signed)
   Subjective: 1 Day Post-Op Procedure(s) (LRB): INTRAMEDULLARY (IM) NAIL INTERTROCHANTRIC (Right) Patient reports pain as mild. Moderate pain with right hip movement only. Patient is well, and has had no acute complaints or problems Denies any CP, SOB, ABD pain. We will continue therapy today.  .  Objective: Vital signs in last 24 hours: Temp:  [97.1 F (36.2 C)-99 F (37.2 C)] 97.5 F (36.4 C) (10/23 0809) Pulse Rate:  [73-96] 96 (10/23 0809) Resp:  [16-21] 20 (10/23 0809) BP: (83-187)/(37-72) 140/72 (10/23 0905) SpO2:  [93 %-100 %] 99 % (10/23 0809) Weight:  [44.5 kg (98 lb)] 44.5 kg (98 lb) (10/22 1301)  Intake/Output from previous day: 10/22 0701 - 10/23 0700 In: 2458.9 [I.V.:2408.9; IV Piggyback:50] Out: 100 [Blood:100] Intake/Output this shift: No intake/output data recorded.   Recent Labs  08/17/17 1927 08/18/17 0327 08/19/17 0346 08/19/17 1915 08/20/17 0527  HGB 12.8 11.5* 10.4* 9.1* 8.6*    Recent Labs  08/19/17 1915 08/20/17 0527  WBC 19.6* 14.2*  RBC 2.93* 2.81*  HCT 27.2* 26.3*  PLT 207 191    Recent Labs  08/19/17 0346 08/20/17 0527  NA 132* 134*  K 4.3 5.0  CL 106 105  CO2 22 19*  BUN 28* 33*  CREATININE 1.32* 1.54*  GLUCOSE 125* 117*  CALCIUM 8.3* 7.6*    Recent Labs  08/17/17 1927  INR 1.01    EXAM General - Patient is Alert, Appropriate and Oriented Extremity - Neurovascular intact Sensation intact distally Intact pulses distally Dorsiflexion/Plantar flexion intact No cellulitis present Compartment soft Dressing - scant drainage and dressing changed Motor Function - intact, moving foot and toes well on exam.   Past Medical History:  Diagnosis Date  . Coronary artery disease 03/06/13   90% prox LAD stenosis s/p DES  . Difficult intubation   . Dizziness   . GERD (gastroesophageal reflux disease)   . Hypotension   . Ischemic cardiomyopathy 03/09/13   s/p NSTEMI. EF 30-35%.  . MI (myocardial infarction) ([facility])   .  PAF (paroxysmal atrial fibrillation) ([place]) 03/06/13   In setting of NSTEMI and reperfusion, no recurrence  . SVT (supraventricular tachycardia) ([facility])    Self-limited on outpatient cardiac monitor    Assessment/Plan:   1 Day Post-Op Procedure(s) (LRB): INTRAMEDULLARY (IM) NAIL INTERTROCHANTRIC (Right) Active Problems:   Closed right hip fracture ([facility])  Estimated body mass index is 19.14 kg/m as calculated from the following:   Height as of this encounter: 5' (1.524 m).   Weight as of this encounter: 44.5 kg (98 lb). Advance diet Up with therapy  Needs BM Acute post op blood loss anemia - Hgb 8.6. Recheck labs in the am. Start Iron supplement Lovenox 30 mg SQ daily x 14 days Staple removal and steri strip application 09/98/33 Follow up with [facility] in 6 weeks for recheck   DVT Prophylaxis - Lovenox, Foot Pumps and TED hose Weight-Bearing as tolerated to right leg   T. Rachelle Hora, PA-C [place] 08/20/2017, 9:45 AM

## 2017-08-21 LAB — HEMOGLOBIN AND HEMATOCRIT, BLOOD
HCT: 29.7 % — ABNORMAL LOW (ref 35.0–47.0)
HEMOGLOBIN: 9.9 g/dL — AB (ref 12.0–16.0)

## 2017-08-21 LAB — BASIC METABOLIC PANEL WITH GFR
Anion gap: 7 (ref 5–15)
BUN: 40 mg/dL — ABNORMAL HIGH (ref 6–20)
CO2: 20 mmol/L — ABNORMAL LOW (ref 22–32)
Calcium: 7.5 mg/dL — ABNORMAL LOW (ref 8.9–10.3)
Chloride: 105 mmol/L (ref 101–111)
Creatinine, Ser: 1.74 mg/dL — ABNORMAL HIGH (ref 0.44–1.00)
GFR calc Af Amer: 29 mL/min — ABNORMAL LOW
GFR calc non Af Amer: 25 mL/min — ABNORMAL LOW
Glucose, Bld: 120 mg/dL — ABNORMAL HIGH (ref 65–99)
Potassium: 4.1 mmol/L (ref 3.5–5.1)
Sodium: 132 mmol/L — ABNORMAL LOW (ref 135–145)

## 2017-08-21 LAB — CBC
HEMATOCRIT: 21.9 % — AB (ref 35.0–47.0)
Hemoglobin: 7.2 g/dL — ABNORMAL LOW (ref 12.0–16.0)
MCH: 30.7 pg (ref 26.0–34.0)
MCHC: 33 g/dL (ref 32.0–36.0)
MCV: 92.8 fL (ref 80.0–100.0)
Platelets: 226 10*3/uL (ref 150–440)
RBC: 2.36 MIL/uL — ABNORMAL LOW (ref 3.80–5.20)
RDW: 14.6 % — AB (ref 11.5–14.5)
WBC: 19.8 10*3/uL — ABNORMAL HIGH (ref 3.6–11.0)

## 2017-08-21 LAB — PREPARE RBC (CROSSMATCH)

## 2017-08-21 LAB — URINE CULTURE

## 2017-08-21 MED ORDER — SODIUM CHLORIDE 0.9 % IV SOLN: Freq: Once | INTRAVENOUS | Status: AC

## 2017-08-21 MED ORDER — SODIUM CHLORIDE 0.9 % IV SOLN
Freq: Once | INTRAVENOUS | Status: AC
  Administered 2017-08-21: 13:00:00 via INTRAVENOUS

## 2017-08-21 NOTE — Progress Notes (Signed)
Physical Therapy Treatment Patient Details Name: Linda Hart MRN: [id number] DOB: 02/05/1925 Today's Date: 08/21/2017    History of Present Illness s/p R IM nailing on 10/22 for a closed R hip fracture. PMH of CAD s/p stent, GERD, paroxysmal afib, SVT.    PT Comments    Pt scheduled to receive 1 unit blood transfusion due to acute post op blood loss anemia (Hgb 7.2), OOB mobility contraindicated at this time. Reviewed written HEP packet, pt performed supine there-ex bilaterally, with rest breaks after each exercise. Pt appeared fatigued, however able to perform all exercises. Daughter was present throughout session to provide company. Will follow this afternoon and progress with PT as appropriate.   Follow Up Recommendations  SNF     Equipment Recommendations  Rolling walker with 5" wheels    Recommendations for Other Services       Precautions / Restrictions Precautions Precautions: Fall Restrictions Weight Bearing Restrictions: Yes RLE Weight Bearing: Weight bearing as tolerated    Mobility  Bed Mobility               General bed mobility comments: Deferred   Transfers                 General transfer comment: Deferred  Ambulation/Gait             General Gait Details: Deferred   Stairs            Wheelchair Mobility    Modified Rankin (Stroke Patients Only)       Balance       Sitting balance - Comments: Deferred                                    Cognition Arousal/Alertness: Awake/alert Behavior During Therapy: WFL for tasks assessed/performed Overall Cognitive Status: Within Functional Limits for tasks assessed                                        Exercises Other Exercises Other Exercises: Ther-ex 20x both sides, ankle pumps, quad sets, glute sets, hip abd/add, heel slides, bicep curls, arm raises with inhale/exhale. Tactile and verbal cues for proper technique. Pt requires min assist  with RLE to perform heel slides and hip abd/add, performed through partial ROM.     General Comments        Pertinent Vitals/Pain Pain Assessment: 0-10 Pain Score: 2  Pain Location: R hip  Pain Intervention(s): Limited activity within patient's tolerance;Monitored during session;Repositioned    Home Living                      Prior Function            PT Goals (current goals can now be found in the care plan section) Acute Rehab PT Goals Patient Stated Goal: to get stronger PT Goal Formulation: With patient Time For Goal Achievement: 09/03/17 Potential to Achieve Goals: Good Progress towards PT goals: Progressing toward goals    Frequency    BID      PT Plan Current plan remains appropriate    Co-evaluation              AM-PAC PT "6 Clicks" Daily Activity  Outcome Measure  Difficulty turning over in bed (including adjusting bedclothes, sheets and blankets)?: Unable Difficulty moving from  lying on back to sitting on the side of the bed? : Unable Difficulty sitting down on and standing up from a chair with arms (e.g., wheelchair, bedside commode, etc,.)?: Unable Help needed moving to and from a bed to chair (including a wheelchair)?: A Little Help needed walking in hospital room?: A Lot Help needed climbing 3-5 steps with a railing? : A Lot 6 Click Score: 10    End of Session   Activity Tolerance: Patient tolerated treatment well;Patient limited by fatigue Patient left: in bed;with call bell/phone within reach;with chair alarm set;with family/visitor present   PT Visit Diagnosis: Other abnormalities of gait and mobility (R26.89);Muscle weakness (generalized) (M62.81);Pain Pain - Right/Left: Right Pain - part of body: Hip     Time: 0929-0955 PT Time Calculation (min) (ACUTE ONLY): 26 min  Charges:                       G Codes:  Functional Assessment Tool Used: AM-PAC 6 Clicks Basic Mobility Functional Limitation: Mobility: Walking and  moving around Mobility: Walking and Moving Around Current Status (V7793): At least 60 percent but less than 80 percent impaired, limited or restricted Mobility: Walking and Moving Around Goal Status [phone]): At least 40 percent but less than 60 percent impaired, limited or restricted    Manfred Arch, SPT   Manfred Arch 08/21/2017, 11:48 AM

## 2017-08-21 NOTE — Evaluation (Signed)
Occupational Therapy Evaluation Patient Details Name: Linda Hart MRN: [id number] DOB: May 21, 1925 Today's Date: 08/21/2017    History of Present Illness 83yo female pt s/p R IM nailing on 10/22 for a closed R hip fracture. PMH of CAD s/p stent, GERD, paroxysmal afib, SVT.   Clinical Impression   Pt seen for OT evaluation this date. Pt received up in recliner with nurse and daughter in room. Pt preparing to return to bed due to fatigue and R hip pain and agreeable to OT assisting. Pt required min assist for sit to stand transfer with verbal cues for hand placement to maximize safety and performance. Min guard for ambulating 3 steps back to bed with RW. Mod-max assist for BLE mgt for sit>supine. Pt reporting "a little" R hip pain once back in bed, better than when she was up in the recliner. Pt receiving blood transfusion throughout session. Pt VSS throughout, just endorses fatigue. Pt independent with ADL prior. Pt would benefit from skilled OT services to address noted impairments in balance, strength, activity tolerance, and self care skills in order to maximize safety and return to functional independence with self care. Recommend transition to STR following hospitalization.    Follow Up Recommendations  SNF    Equipment Recommendations  None recommended by OT    Recommendations for Other Services       Precautions / Restrictions Precautions Precautions: Fall Restrictions Weight Bearing Restrictions: Yes RLE Weight Bearing: Weight bearing as tolerated      Mobility Bed Mobility Overal bed mobility: Needs Assistance Bed Mobility: Sit to Supine     Supine to sit: Mod assist;Max assist     General bed mobility comments: mod-max assist for BLE mgt  Transfers Overall transfer level: Needs assistance Equipment used: Rolling walker (2 wheeled) Transfers: Sit to/from Stand Sit to Stand: Min assist             Balance Overall balance assessment: Needs  assistance Sitting-balance support: Feet supported Sitting balance-Leahy Scale: Fair   Standing balance support: Bilateral upper extremity supported Standing balance-Leahy Scale: Poor                             ADL either performed or assessed with clinical judgement   ADL Overall ADL's : Needs assistance/impaired Eating/Feeding: Sitting;Set up   Grooming: Sitting;Set up   Upper Body Bathing: Sitting;Set up;Supervision/ safety   Lower Body Bathing: Sitting/lateral leans;Moderate assistance;Maximal assistance   Upper Body Dressing : Sitting;Set up;Supervision/safety   Lower Body Dressing: Sitting/lateral leans;Sit to/from stand;Maximal assistance;Moderate assistance   Toilet Transfer: Ambulation;RW;BSC;Minimal assistance           Functional mobility during ADLs: Min guard;Rolling walker;Minimal assistance       Vision Baseline Vision/History: Wears glasses Wears Glasses: At all times Patient Visual Report: No change from baseline Vision Assessment?: No apparent visual deficits     Perception     Praxis      Pertinent Vitals/Pain Pain Assessment: 0-10 Pain Score: 2  Pain Location: R hip  Pain Intervention(s): Limited activity within patient's tolerance;Monitored during session;Repositioned     Hand Dominance     Extremity/Trunk Assessment Upper Extremity Assessment Upper Extremity Assessment: Generalized weakness (grossly 4-/5 bilaterally)   Lower Extremity Assessment Lower Extremity Assessment: Generalized weakness (grossly 4-/5 bilaterally, R hip pain limiting ability to fully assess)   Cervical / Trunk Assessment Cervical / Trunk Assessment: Normal   Communication Communication Communication: HOH;No difficulties   Cognition Arousal/Alertness: Awake/alert  Behavior During Therapy: WFL for tasks assessed/performed Overall Cognitive Status: Within Functional Limits for tasks assessed                                 General  Comments: very fatigued   General Comments  pt receiving blood transfusion    Exercises    Shoulder Instructions      Home Living Family/patient expects to be discharged to:: Assisted living                             Home Equipment: Other (comment) (walking stick)   Additional Comments: Pt reports occasionally using walking stick when walking to/from cafeteria which is down the hall.       Prior Functioning/Environment Level of Independence: Independent        Comments: Pt reports being independent with ADLs at ALF, enjoys playing board games, does not do physical activity         OT Problem List: Decreased strength;Pain;Decreased range of motion;Decreased activity tolerance;Decreased knowledge of use of DME or AE;Impaired balance (sitting and/or standing);Decreased knowledge of precautions      OT Treatment/Interventions: Self-care/ADL training;Therapeutic exercise;Therapeutic activities;DME and/or AE instruction;Patient/family education;Balance training    OT Goals(Current goals can be found in the care plan section) Acute Rehab OT Goals Patient Stated Goal: to get stronger OT Goal Formulation: With patient/family Time For Goal Achievement: 09/04/17 Potential to Achieve Goals: Good  OT Frequency: Min 1X/week   Barriers to D/C:            Co-evaluation              AM-PAC PT "6 Clicks" Daily Activity     Outcome Measure Help from another person eating meals?: None Help from another person taking care of personal grooming?: None Help from another person toileting, which includes using toliet, bedpan, or urinal?: A Little Help from another person bathing (including washing, rinsing, drying)?: A Lot Help from another person to put on and taking off regular upper body clothing?: A Little Help from another person to put on and taking off regular lower body clothing?: A Lot 6 Click Score: 18   End of Session Equipment Utilized During Treatment:  Gait belt;Rolling walker  Activity Tolerance: Patient limited by pain;Patient limited by fatigue Patient left: in bed;with call bell/phone within reach;with bed alarm set;with family/visitor present;with nursing/sitter in room  OT Visit Diagnosis: Other abnormalities of gait and mobility (R26.89);Muscle weakness (generalized) (M62.81);Pain Pain - Right/Left: Right Pain - part of body: Hip                Time: 3664-4034 OT Time Calculation (min): 18 min Charges:  OT General Charges $OT Visit: 1 Visit OT Evaluation $OT Eval Low Complexity: 1 Low OT Treatments $Self Care/Home Management : 8-22 mins G-Codes: OT G-codes **NOT FOR INPATIENT CLASS** Functional Assessment Tool Used: AM-PAC 6 Clicks Daily Activity;Clinical judgement Functional Limitation: Self care Self Care Current Status (V4259): At least 40 percent but less than 60 percent impaired, limited or restricted Self Care Goal Status (D6387): At least 20 percent but less than 40 percent impaired, limited or restricted   Jeni Salles, MPH, MS, OTR/L ascom [phone] 08/21/17, 2:46 PM

## 2017-08-21 NOTE — Progress Notes (Signed)
   Subjective: 2 Days Post-Op Procedure(s) (LRB): INTRAMEDULLARY (IM) NAIL INTERTROCHANTRIC (Right) Patient reports pain as 0 on 0-10 scale. Moderate pain with right hip movement only. Patient is well, and has had no acute complaints or problems Denies any CP, SOB, ABD pain. We will continue therapy today.  .  Objective: Vital signs in last 24 hours: Temp:  [97.5 F (36.4 C)-98.2 F (36.8 C)] 98 F (36.7 C) (10/23 2345) Pulse Rate:  [96-102] 96 (10/23 2345) Resp:  [18-20] 18 (10/23 2345) BP: (135-154)/(40-72) 135/44 (10/23 2345) SpO2:  [96 %-99 %] 96 % (10/23 2345)  Intake/Output from previous day: 10/23 0701 - 10/24 0700 In: 2086.3 [P.O.:240; I.V.:1796.3; IV Piggyback:50] Out: -  Intake/Output this shift: No intake/output data recorded.   Recent Labs  08/19/17 0346 08/19/17 1915 08/20/17 0527 08/21/17 0605  HGB 10.4* 9.1* 8.6* 7.2*    Recent Labs  08/20/17 0527 08/21/17 0605  WBC 14.2* 19.8*  RBC 2.81* 2.36*  HCT 26.3* 21.9*  PLT 191 226    Recent Labs  08/20/17 0527 08/21/17 0605  NA 134* 132*  K 5.0 4.1  CL 105 105  CO2 19* 20*  BUN 33* 40*  CREATININE 1.54* 1.74*  GLUCOSE 117* 120*  CALCIUM 7.6* 7.5*   No results for input(s): LABPT, INR in the last 72 hours.  EXAM General - Patient is Alert, Appropriate and Oriented Extremity - Neurovascular intact Sensation intact distally Intact pulses distally Dorsiflexion/Plantar flexion intact No cellulitis present Compartment soft Dressing - scant drainage and dressing changed Motor Function - intact, moving foot and toes well on exam.   Past Medical History:  Diagnosis Date  . Coronary artery disease 03/06/13   90% prox LAD stenosis s/p DES  . Difficult intubation   . Dizziness   . GERD (gastroesophageal reflux disease)   . Hypotension   . Ischemic cardiomyopathy 03/09/13   s/p NSTEMI. EF 30-35%.  . MI (myocardial infarction) ([facility])   . PAF (paroxysmal atrial fibrillation) ([facility]) 03/06/13   In  setting of NSTEMI and reperfusion, no recurrence  . SVT (supraventricular tachycardia) ([facility])    Self-limited on outpatient cardiac monitor    Assessment/Plan:   2 Days Post-Op Procedure(s) (LRB): INTRAMEDULLARY (IM) NAIL INTERTROCHANTRIC (Right) Active Problems:   Closed right hip fracture ([facility])  Estimated body mass index is 19.14 kg/m as calculated from the following:   Height as of this encounter: 5' (1.524 m).   Weight as of this encounter: 44.5 kg (98 lb). Advance diet Up with therapy  Needs BM Acute post op blood loss anemia - Hgb trending down to 7.2. Recommend transfusing 1 unit PRBC. Continue with Iron supplement Lovenox 30 mg SQ daily x 14 days Staple removal and steri strip application 91/69/45 Follow up with [facility] in 6 weeks for recheck   DVT Prophylaxis - Lovenox, Foot Pumps and TED hose Weight-Bearing as tolerated to right leg   T. Rachelle Hora, PA-C [facility] 08/21/2017, 7:36 AM

## 2017-08-21 NOTE — Progress Notes (Addendum)
[facility] [place] NAME: Linda Hart    MR#:  [id number]  DATE OF BIRTH:  07-18-1925  SUBJECTIVE:  CHIEF COMPLAINT:   Chief Complaint  Patient presents with  . Fall   -Right femoral intertrochanteric fracture status post surgery, postoperative day#2 today. -Appears weak and tired. Hemoglobin dropped to 7.2  REVIEW OF SYSTEMS:  Review of Systems  Constitutional: Positive for malaise/fatigue. Negative for chills and fever.  HENT: Positive for hearing loss. Negative for congestion, ear discharge and nosebleeds.   Eyes: Negative for blurred vision and double vision.  Respiratory: Negative for cough, shortness of breath and wheezing.   Cardiovascular: Negative for chest pain, palpitations and leg swelling.  Gastrointestinal: Negative for abdominal pain, constipation, diarrhea, nausea and vomiting.  Genitourinary: Negative for dysuria.  Musculoskeletal: Positive for joint pain and myalgias.  Neurological: Negative for dizziness, speech change, focal weakness, seizures and headaches.  Psychiatric/Behavioral: Negative for depression.    DRUG ALLERGIES:   Allergies  Allergen Reactions  . Penicillins     Has patient had a PCN reaction causing immediate rash, facial/tongue/throat swelling, SOB or lightheadedness with hypotension: Unknown Has patient had a PCN reaction causing severe rash involving mucus membranes or skin necrosis: Unknown Has patient had a PCN reaction that required hospitalization No Has patient had a PCN reaction occurring within the last 10 years: No If all of the above answers are "NO", then may proceed with Cephalosporin use.    VITALS:  Blood pressure (!) 147/47, pulse 93, temperature 97.6 F (36.4 C), temperature source Oral, resp. rate 18, height 5' (1.524 m), weight 44.5 kg (98 lb), SpO2 96 %.  PHYSICAL EXAMINATION:  Physical Exam  GENERAL:  83 y.o.-year-old elderly patient lying in the bed with no  acute distress.  EYES: Pupils equal, round, reactive to light and accommodation. No scleral icterus. Extraocular muscles intact.  HEENT: Head atraumatic, normocephalic. Oropharynx and nasopharynx clear.  NECK:  Supple, no jugular venous distention. No thyroid enlargement, no tenderness.  LUNGS: Normal breath sounds bilaterally, no wheezing, rales,rhonchi or crepitation. No use of accessory muscles of respiration. Decreased bibasilar breath sounds CARDIOVASCULAR: S1, S2 normal. No  rubs, or gallops. 2/6 systolic murmur is present ABDOMEN: Soft, nontender, nondistended. Bowel sounds present. No organomegaly or mass.  EXTREMITIES: Right lateral thigh dressing with minimal oozing noted.. No pedal edema, cyanosis, or clubbing.  NEUROLOGIC: Cranial nerves II through XII are intact. Muscle strength 5/5 in all extremities. Sensation intact. Gait not checked.  PSYCHIATRIC: The patient is alert and oriented x 3.  SKIN: No obvious rash, lesion, or ulcer.    LABORATORY PANEL:   CBC  Recent Labs Lab 08/21/17 0605  WBC 19.8*  HGB 7.2*  HCT 21.9*  PLT 226   ------------------------------------------------------------------------------------------------------------------  Chemistries   Recent Labs Lab 08/21/17 0605  NA 132*  K 4.1  CL 105  CO2 20*  GLUCOSE 120*  BUN 40*  CREATININE 1.74*  CALCIUM 7.5*   ------------------------------------------------------------------------------------------------------------------  Cardiac Enzymes No results for input(s): TROPONINI in the last 168 hours. ------------------------------------------------------------------------------------------------------------------  RADIOLOGY:  Dg Hip Operative Unilat W Or W/o Pelvis Right  Result Date: 08/19/2017 CLINICAL DATA:  Intertrochanteric fracture of the proximal right femur. EXAM: OPERATIVE RIGHT HIP (WITH PELVIS IF PERFORMED) 2 VIEWS TECHNIQUE: Fluoroscopic spot image(s) were submitted for  interpretation post-operatively. COMPARISON:  RADIOGRAPHS DATED 08/17/2017 FINDINGS: AP and lateral C-arm images demonstrate the patient has undergone open reduction and internal fixation of the intertrochanteric fracture. Intramedullary  nail and lag screw in place, appear in good position. IMPRESSION: Open reduction and internal fixation of intertrochanteric fracture of the proximal right femur. Electronically Signed   By: Lorriane Shire M.D.   On: 08/19/2017 15:33    EKG:   Orders placed or performed during the hospital encounter of 08/17/17  . ED EKG  . ED EKG  . EKG 12-Lead  . EKG 12-Lead    ASSESSMENT AND PLAN:   83 year old female with past medical history significant for CAD status post LAD stent in 2014, ischemic cardiac myopathy with last bony of 440-45%, paroxysmal atrial fibrillation not on anticoagulation, history of SVT presents to the hospital secondary to fall and right femoral intertrochanteric fracture.  #1 right femoral intertrochanteric fracture-appreciate orthopedic consult. -s/p ORIF POD # 2 today -Continue pain control. DVT prophylaxis lovenox, - physical therapy - needs rehab at discharge  #2 CAD status post stent-also known ischemic cardiomyopathy with EF of 40-45% -Aspirin can be restarted at discharge. LAD stent in 2014. Discussed with her primary cardiologist- can d/c brillinta at discharge for now -Continue carvedilol -Lasix on hold as well compensated at this time. Can be restarted at discharge  #3 Acute on chronic anemia of chronic disease- likely post op drop- needs 1unit Tx due to cardiac history - f/u hb and maintain >8 - on iron supplements.  #4 DVT prophylaxis - lovenox for 2 weeks  #5 Acute cystitis- f/u urine cultures, continue rocephin   Physical therapy consulted   All the records are reviewed and case discussed with Care Management/Social Workerr. Management plans discussed with the patient, family and they are in agreement.  CODE  STATUS: DNR  TOTAL TIME TAKING CARE OF THIS PATIENT: 34 minutes.   POSSIBLE D/C TOMORROW, DEPENDING ON CLINICAL CONDITION.   Gladstone Lighter M.D on 08/21/2017 at 1:38 PM  Between 7am to 6pm - Pager - [phone]  After 6pm go to [email or web address] - password [email or web address] [facility]  Office  [phone]  CC: Primary care physician; Leone Haven, MD

## 2017-08-21 NOTE — Progress Notes (Signed)
PT Cancellation Note  Patient Details Name: Linda Hart MRN: [id number] DOB: May 07, 1925   Cancelled Treatment:    Reason Eval/Treat Not Completed: Other (comment) Chart reviewed, pt received 1 unit RBC transfusion, labs pending. Pt reported feeling very fatigued, requested to hold on afternoon session to rest. Will resume PT tomorrow.   Manfred Arch, SPT Manfred Arch 08/21/2017, 2:46 PM

## 2017-08-21 NOTE — Progress Notes (Signed)
OT Cancellation Note  Patient Details Name: Linda Hart MRN: [id number] DOB: 03-19-1925   Cancelled Treatment:    Reason Eval/Treat Not Completed: Patient not medically ready. Order received, chart reviewed. Pt to receive 1 unit PRBC this am due to acute post op blood loss anemia (Hgb 7.2, HCT 21.9). Contraindicated for OT at this time, will continue to follow acutely and re-attempt at later date/time as medically appropriate.  Jeni Salles, MPH, MS, OTR/L ascom [phone] 08/21/17, 8:18 AM

## 2017-08-22 ENCOUNTER — Telehealth: Payer: Self-pay | Admitting: Family Medicine

## 2017-08-22 DIAGNOSIS — I25119 Atherosclerotic heart disease of native coronary artery with unspecified angina pectoris: Secondary | ICD-10-CM | POA: Diagnosis not present

## 2017-08-22 DIAGNOSIS — K921 Melena: Secondary | ICD-10-CM | POA: Diagnosis not present

## 2017-08-22 DIAGNOSIS — M84459A Pathological fracture, hip, unspecified, initial encounter for fracture: Secondary | ICD-10-CM | POA: Diagnosis not present

## 2017-08-22 DIAGNOSIS — S72142A Displaced intertrochanteric fracture of left femur, initial encounter for closed fracture: Secondary | ICD-10-CM | POA: Diagnosis not present

## 2017-08-22 DIAGNOSIS — J181 Lobar pneumonia, unspecified organism: Secondary | ICD-10-CM | POA: Diagnosis not present

## 2017-08-22 DIAGNOSIS — J40 Bronchitis, not specified as acute or chronic: Secondary | ICD-10-CM | POA: Diagnosis not present

## 2017-08-22 DIAGNOSIS — Z967 Presence of other bone and tendon implants: Secondary | ICD-10-CM | POA: Diagnosis not present

## 2017-08-22 DIAGNOSIS — Z9181 History of falling: Secondary | ICD-10-CM | POA: Diagnosis not present

## 2017-08-22 DIAGNOSIS — L932 Other local lupus erythematosus: Secondary | ICD-10-CM | POA: Diagnosis not present

## 2017-08-22 DIAGNOSIS — Z8781 Personal history of (healed) traumatic fracture: Secondary | ICD-10-CM | POA: Diagnosis not present

## 2017-08-22 DIAGNOSIS — D649 Anemia, unspecified: Secondary | ICD-10-CM | POA: Diagnosis not present

## 2017-08-22 DIAGNOSIS — Z7401 Bed confinement status: Secondary | ICD-10-CM | POA: Diagnosis not present

## 2017-08-22 DIAGNOSIS — K219 Gastro-esophageal reflux disease without esophagitis: Secondary | ICD-10-CM | POA: Diagnosis not present

## 2017-08-22 DIAGNOSIS — D72829 Elevated white blood cell count, unspecified: Secondary | ICD-10-CM | POA: Diagnosis not present

## 2017-08-22 DIAGNOSIS — I480 Paroxysmal atrial fibrillation: Secondary | ICD-10-CM | POA: Diagnosis not present

## 2017-08-22 DIAGNOSIS — M79604 Pain in right leg: Secondary | ICD-10-CM | POA: Diagnosis not present

## 2017-08-22 DIAGNOSIS — Z09 Encounter for follow-up examination after completed treatment for conditions other than malignant neoplasm: Secondary | ICD-10-CM | POA: Diagnosis not present

## 2017-08-22 DIAGNOSIS — I2510 Atherosclerotic heart disease of native coronary artery without angina pectoris: Secondary | ICD-10-CM | POA: Diagnosis not present

## 2017-08-22 DIAGNOSIS — N3090 Cystitis, unspecified without hematuria: Secondary | ICD-10-CM | POA: Diagnosis not present

## 2017-08-22 DIAGNOSIS — R600 Localized edema: Secondary | ICD-10-CM | POA: Diagnosis not present

## 2017-08-22 DIAGNOSIS — R2689 Other abnormalities of gait and mobility: Secondary | ICD-10-CM | POA: Diagnosis not present

## 2017-08-22 DIAGNOSIS — I5042 Chronic combined systolic (congestive) and diastolic (congestive) heart failure: Secondary | ICD-10-CM | POA: Diagnosis not present

## 2017-08-22 DIAGNOSIS — I255 Ischemic cardiomyopathy: Secondary | ICD-10-CM | POA: Diagnosis not present

## 2017-08-22 DIAGNOSIS — R0602 Shortness of breath: Secondary | ICD-10-CM | POA: Diagnosis not present

## 2017-08-22 DIAGNOSIS — L89153 Pressure ulcer of sacral region, stage 3: Secondary | ICD-10-CM | POA: Diagnosis not present

## 2017-08-22 DIAGNOSIS — K567 Ileus, unspecified: Secondary | ICD-10-CM | POA: Diagnosis not present

## 2017-08-22 DIAGNOSIS — S72001A Fracture of unspecified part of neck of right femur, initial encounter for closed fracture: Secondary | ICD-10-CM | POA: Diagnosis not present

## 2017-08-22 DIAGNOSIS — S72141D Displaced intertrochanteric fracture of right femur, subsequent encounter for closed fracture with routine healing: Secondary | ICD-10-CM | POA: Diagnosis not present

## 2017-08-22 DIAGNOSIS — I10 Essential (primary) hypertension: Secondary | ICD-10-CM | POA: Diagnosis not present

## 2017-08-22 DIAGNOSIS — M8000XA Age-related osteoporosis with current pathological fracture, unspecified site, initial encounter for fracture: Secondary | ICD-10-CM | POA: Diagnosis not present

## 2017-08-22 DIAGNOSIS — J441 Chronic obstructive pulmonary disease with (acute) exacerbation: Secondary | ICD-10-CM | POA: Diagnosis not present

## 2017-08-22 DIAGNOSIS — S72001D Fracture of unspecified part of neck of right femur, subsequent encounter for closed fracture with routine healing: Secondary | ICD-10-CM | POA: Diagnosis not present

## 2017-08-22 DIAGNOSIS — E785 Hyperlipidemia, unspecified: Secondary | ICD-10-CM | POA: Diagnosis not present

## 2017-08-22 DIAGNOSIS — R0989 Other specified symptoms and signs involving the circulatory and respiratory systems: Secondary | ICD-10-CM | POA: Diagnosis not present

## 2017-08-22 DIAGNOSIS — D638 Anemia in other chronic diseases classified elsewhere: Secondary | ICD-10-CM | POA: Diagnosis not present

## 2017-08-22 DIAGNOSIS — M6281 Muscle weakness (generalized): Secondary | ICD-10-CM | POA: Diagnosis not present

## 2017-08-22 LAB — CBC
HCT: 26.7 % — ABNORMAL LOW (ref 35.0–47.0)
Hemoglobin: 9 g/dL — ABNORMAL LOW (ref 12.0–16.0)
MCH: 30 pg (ref 26.0–34.0)
MCHC: 33.6 g/dL (ref 32.0–36.0)
MCV: 89.1 fL (ref 80.0–100.0)
PLATELETS: 203 10*3/uL (ref 150–440)
RBC: 3 MIL/uL — AB (ref 3.80–5.20)
RDW: 15.1 % — AB (ref 11.5–14.5)
WBC: 18.2 10*3/uL — ABNORMAL HIGH (ref 3.6–11.0)

## 2017-08-22 LAB — BASIC METABOLIC PANEL
Anion gap: 7 (ref 5–15)
BUN: 33 mg/dL — ABNORMAL HIGH (ref 6–20)
CALCIUM: 7.3 mg/dL — AB (ref 8.9–10.3)
CO2: 20 mmol/L — ABNORMAL LOW (ref 22–32)
CREATININE: 1.18 mg/dL — AB (ref 0.44–1.00)
Chloride: 109 mmol/L (ref 101–111)
GFR calc Af Amer: 46 mL/min — ABNORMAL LOW (ref >60)
GFR, EST NON AFRICAN AMERICAN: 40 mL/min — AB (ref >60)
Glucose, Bld: 102 mg/dL — ABNORMAL HIGH (ref 65–99)
Potassium: 3.7 mmol/L (ref 3.5–5.1)
SODIUM: 136 mmol/L (ref 135–145)

## 2017-08-22 MED ORDER — ALUM & MAG HYDROXIDE-SIMETH 200-200-20 MG/5ML PO SUSP: 30.0000 mL | ORAL | 0 refills | Status: DC | PRN

## 2017-08-22 MED ORDER — ENOXAPARIN SODIUM 30 MG/0.3ML ~~LOC~~ SOLN
30.0000 mg | SUBCUTANEOUS | 0 refills | Status: DC
Start: 2017-08-22 — End: 2017-09-09

## 2017-08-22 MED ORDER — DOCUSATE SODIUM 100 MG PO CAPS: 100.0000 mg | ORAL_CAPSULE | Freq: Two times a day (BID) | ORAL | 0 refills | Status: DC

## 2017-08-22 MED ORDER — LORATADINE 10 MG PO TABS: 10.0000 mg | ORAL_TABLET | Freq: Every day | ORAL | 11 refills | Status: DC | PRN

## 2017-08-22 MED ORDER — FERROUS SULFATE 325 (65 FE) MG PO TABS: 325.0000 mg | ORAL_TABLET | Freq: Two times a day (BID) | ORAL | 3 refills | Status: DC

## 2017-08-22 MED ORDER — CEPHALEXIN 250 MG PO CAPS: 250.0000 mg | ORAL_CAPSULE | Freq: Three times a day (TID) | ORAL | 0 refills | Status: AC

## 2017-08-22 MED ORDER — ENOXAPARIN SODIUM 30 MG/0.3ML ~~LOC~~ SOLN: 30.0000 mg | SUBCUTANEOUS | 0 refills | Status: DC

## 2017-08-22 MED ORDER — HYDROCODONE-ACETAMINOPHEN 5-325 MG PO TABS: 1.0000 | ORAL_TABLET | Freq: Four times a day (QID) | ORAL | 0 refills | Status: DC | PRN

## 2017-08-22 NOTE — Discharge Instructions (Signed)
Diet: As you were doing prior to hospitalization   Shower: Keep incision site clean and dry. If the bandage gets wet, change with a clean dry gauze.  Dressing:  You may change your dressing as needed. Change the dressing with sterile gauze dressing.  Staple removal and steri strip application 03/70/48  Activity:  Increase activity slowly as tolerated, but follow the weight bearing instructions below.  No lifting or driving for 6 weeks.  Weight Bearing:   Weight bearing as tolerated to right lower extremity  To prevent constipation: you may use a stool softener such as -  Colace (over the counter) 100 mg by mouth twice a day  Drink plenty of fluids (prune juice may be helpful) and high fiber foods Miralax (over the counter) for constipation as needed.    Itching:  If you experience itching with your medications, try taking only a single pain pill, or even half a pain pill at a time.  You may take up to 10 pain pills per day, and you can also use benadryl over the counter for itching or also to help with sleep.   Precautions:  If you experience chest pain or shortness of breath - call 911 immediately for transfer to the hospital emergency department!!  If you develop a fever greater that 101 F, purulent drainage from wound, increased redness or drainage from wound, or calf pain-Call [facility]                                               Follow- Up Appointment:  Please call for an appointment to be seen in 6 weeks at [facility]

## 2017-08-22 NOTE — Progress Notes (Signed)
Physical Therapy Treatment Patient Details Name: Linda Hart MRN: [id number] DOB: October 18, 1925 Today's Date: 08/22/2017    History of Present Illness 83yo female pt s/p R IM nailing on 10/22 for a closed R hip fracture. PMH of CAD s/p stent, GERD, paroxysmal afib, SVT. Received blood transfusion on 10/24 for low Hgb.     PT Comments    Pt is making progress towards goals. Pt appeared much more alert and awake today. Pt performed there-ex, transfers/amb with RW with min assist. Pt amb to door and back to recliner, required less frequent cues for proper step sequence, slow amb, no breaks needed. RN was present during session to provide meds. Pt continues to demonstrate deficits with LE strength and endurance. Will continue to progress with strengthening and mobility.   Follow Up Recommendations  SNF     Equipment Recommendations  Rolling walker with 5" wheels    Recommendations for Other Services       Precautions / Restrictions Precautions Precautions: Fall Restrictions Weight Bearing Restrictions: Yes RLE Weight Bearing: Weight bearing as tolerated    Mobility  Bed Mobility               General bed mobility comments: Pt received in recliner  Transfers Overall transfer level: Needs assistance Equipment used: Rolling walker (2 wheeled) Transfers: Sit to/from Stand Sit to Stand: Min assist         General transfer comment: Cues for proper hand placement, slow to rise, cues for upright posture  Ambulation/Gait Ambulation/Gait assistance: Min assist Ambulation Distance (Feet): 40 Feet Assistive device: Rolling walker (2 wheeled) Gait Pattern/deviations: Step-to pattern     General Gait Details: Fewer cues needed for proper step sequence, able to take full steps on both sides, slow amb   Stairs            Wheelchair Mobility    Modified Rankin (Stroke Patients Only)       Balance Overall balance assessment: Needs assistance Sitting-balance  support: Feet supported Sitting balance-Leahy Scale: Fair Sitting balance - Comments: Pt able to sit in recliner with no assist   Standing balance support: Bilateral upper extremity supported Standing balance-Leahy Scale: Fair Standing balance comment: Relies on RW for support, able to stand for several minutes and use both hands off the RW to blow her nose                            Cognition Arousal/Alertness: Awake/alert Behavior During Therapy: WFL for tasks assessed/performed Overall Cognitive Status: Within Functional Limits for tasks assessed                                 General Comments: Pt more alert this morning      Exercises Other Exercises Other Exercises: Therex 20x both sides, ankle pumps, glute sets, quad sets, hip abd/add, LAQs. CGA to perform hip abd/add and LAQs through small ROM    General Comments        Pertinent Vitals/Pain Pain Assessment: Faces Pain Score: 2  Faces Pain Scale: Hurts a little bit Pain Location: R hip  Pain Intervention(s): Limited activity within patient's tolerance;Monitored during session;Repositioned    Home Living                      Prior Function            PT Goals (current  goals can now be found in the care plan section) Acute Rehab PT Goals Patient Stated Goal: to get stronger PT Goal Formulation: With patient Time For Goal Achievement: 09/03/17 Potential to Achieve Goals: Good Progress towards PT goals: Progressing toward goals    Frequency    BID      PT Plan Current plan remains appropriate    Co-evaluation              AM-PAC PT "6 Clicks" Daily Activity  Outcome Measure  Difficulty turning over in bed (including adjusting bedclothes, sheets and blankets)?: Unable Difficulty moving from lying on back to sitting on the side of the bed? : Unable Difficulty sitting down on and standing up from a chair with arms (e.g., wheelchair, bedside commode, etc,.)?:  Unable Help needed moving to and from a bed to chair (including a wheelchair)?: A Little Help needed walking in hospital room?: A Little Help needed climbing 3-5 steps with a railing? : A Lot 6 Click Score: 11    End of Session Equipment Utilized During Treatment: Gait belt Activity Tolerance: Patient tolerated treatment well Patient left: in chair;with call bell/phone within reach;with chair alarm set;with family/visitor present Nurse Communication: Mobility status PT Visit Diagnosis: Other abnormalities of gait and mobility (R26.89);Muscle weakness (generalized) (M62.81);Pain Pain - Right/Left: Right Pain - part of body: Hip     Time: 5027-7412 PT Time Calculation (min) (ACUTE ONLY): 28 min  Charges:                       G Codes:  Functional Assessment Tool Used: AM-PAC 6 Clicks Basic Mobility Functional Limitation: Mobility: Walking and moving around Mobility: Walking and Moving Around Current Status (I7867): At least 60 percent but less than 80 percent impaired, limited or restricted Mobility: Walking and Moving Around Goal Status [phone]): At least 40 percent but less than 60 percent impaired, limited or restricted    Manfred Arch, SPT   Manfred Arch 08/22/2017, 11:35 AM

## 2017-08-22 NOTE — Telephone Encounter (Signed)
Manuela Schwartz from [facility] called to schedule pt for 2 week HFU for fall/rt hip surgery. Pt is scheduled for 11/7 @ 8:30.

## 2017-08-22 NOTE — Clinical Social Work Placement (Signed)
   CLINICAL SOCIAL WORK PLACEMENT  NOTE  Date:  08/22/2017  Patient Details  Name: Linda Hart MRN: [id number] Date of Birth: 24-Apr-1925  Clinical Social Work is seeking post-discharge placement for this patient at the [place] level of care (*CSW will initial, date and re-position this form in  chart as items are completed):  Yes   Patient/family provided with [place] Work Department's list of facilities offering this level of care within the geographic area requested by the patient (or if unable, by the patient's family).  Yes   Patient/family informed of their freedom to choose among providers that offer the needed level of care, that participate in Medicare, Medicaid or managed care program needed by the patient, have an available bed and are willing to accept the patient.  Yes   Patient/family informed of [facility]'s ownership interest in [facility] and [facility], as well as of the fact that they are under no obligation to receive care at these facilities.  PASRR submitted to EDS on       PASRR number received on       Existing PASRR number confirmed on 08/19/17     FL2 transmitted to all facilities in geographic area requested by pt/family on 08/19/17     FL2 transmitted to all facilities within larger geographic area on       Patient informed that his/her managed care company has contracts with or will negotiate with certain facilities, including the following:        Yes   Patient/family informed of bed offers received.  Patient chooses bed at  [facility] )     Physician recommends and patient chooses bed at      Patient to be transferred to  [facility] ) on 08/22/17.  Patient to be transferred to facility by  [facility] )     Patient family notified on 08/22/17 of transfer.  Name of family member notified:   (Patient's daughter Stanton Kidney is at bedside and aware of D/C today. )     PHYSICIAN        Additional Comment:    _______________________________________________ Anjeli Casad, Veronia Beets, LCSW 08/22/2017, 2:10 PM

## 2017-08-22 NOTE — Progress Notes (Signed)
Initial Nutrition Assessment  DOCUMENTATION CODES:   Severe malnutrition in context of chronic illness  INTERVENTION:  1. Recommend Ensure Enlive po BID, each supplement provides 350 kcal and 20 grams of protein  2. MVI w/ minerals  NUTRITION DIAGNOSIS:   Malnutrition (Severe) related to chronic illness as evidenced by severe depletion of muscle mass, severe depletion of body fat.  GOAL:   Patient will meet greater than or equal to 90% of their needs  MONITOR:   PO intake, I & O's, Labs, Supplement acceptance, Weight trends  REASON FOR ASSESSMENT:   Consult Poor PO  ASSESSMENT:   Linda Hart  is a 83 y.o. female with a known history of coronary artery disease status post stents, GERD, ischemic cardiomyopathy ejection fraction of 30-35%, paroxysmal atrial fibrillation, history of SVT who presents to the hospital after a mechanical fall and noted to have a right hip fracture  Spoke with patient, daughter at bedside. She is now 3 days s/p intramedullary nail intertrochanteric. Normally eats 2 pieces of cinnamon raisin toast with coffee for breakfast, 1/2 a sandwich or an egg with soup for lunch and nibbles at whatever is cooked for dinner. Patient comes from [place], says she does not like the food cooked there. Was drinking ensure daughter provided during visit. Patient was drinking 3 of these per week, encouraged her to drink 2 a day to help with healing following hip fracture as well as overall weight and muscle maintenance. Patient exhibits severe muscle wasting and severe fat depletions all over. Had some nausea and vomiting this morning but was resolved by the time of my visit. No chewing/swallowing problems noted. Hx of GERD. Weight has been stable.   Labs reviewed  Medications reviewed and include:  Colace, Vitamin C, Iron NS at 59mL/hr  Diet Order:  Diet Heart Room service appropriate? Yes; Fluid consistency: Thin  Skin:  Wound (see comment) (closed incision to  leg)  Last BM:  08/22/2017 (type 7)  Height:   Ht Readings from Last 1 Encounters:  08/19/17 5' (1.524 m)    Weight:   Wt Readings from Last 1 Encounters:  08/19/17 98 lb (44.5 kg)    Ideal Body Weight:  45.45 kg  BMI:  Body mass index is 19.14 kg/m.  Estimated Nutritional Needs:   Kcal:  1300-1500 calories  Protein:  67-76 grams (1.5-1.7g/kg)  Fluid:  >1.5L  EDUCATION NEEDS:   Education needs addressed  Satira Anis. Angas Isabell, MS, RD LDN Inpatient Clinical Dietitian Pager [phone]

## 2017-08-22 NOTE — Progress Notes (Signed)
Patient is medically stable for D/C to [facility] today. Per [facility] admissions coordinator at [facility] patient will come today to room 347. RN will call report at [phone] and arrange EMS for transport. Clinical Education officer, museum (CSW) sent D/C orders to [facility] via [employer]. Patient is aware of above. Patient's daughter Stanton Kidney is at bedside and aware of D/C today. Please reconsult if future social work needs arise. CSW signing off.   [employer], LCSW [phone]

## 2017-08-22 NOTE — Discharge Summary (Signed)
[facility] NAME: Linda Hart    MR#:  [id number]  DATE OF BIRTH:  04-21-1925  DATE OF ADMISSION:  08/17/2017   ADMITTING PHYSICIAN: Henreitta Leber, MD  DATE OF DISCHARGE: 08/22/17  PRIMARY CARE PHYSICIAN: Leone Haven, MD   ADMISSION DIAGNOSIS:   Fall, initial encounter [phone]] Closed fracture of right hip, initial encounter ([place]) [S72.001A]  DISCHARGE DIAGNOSIS:   Active Problems:   Closed right hip fracture ([place])   SECONDARY DIAGNOSIS:   Past Medical History:  Diagnosis Date  . Coronary artery disease 03/06/13   90% prox LAD stenosis s/p DES  . Difficult intubation   . Dizziness   . GERD (gastroesophageal reflux disease)   . Hypotension   . Ischemic cardiomyopathy 03/09/13   s/p NSTEMI. EF 30-35%.  . MI (myocardial infarction) ([place])   . PAF (paroxysmal atrial fibrillation) ([place]) 03/06/13   In setting of NSTEMI and reperfusion, no recurrence  . SVT (supraventricular tachycardia) ([facility])    Self-limited on outpatient cardiac monitor    HOSPITAL COURSE:   83 year old female with past medical history significant for CAD status post LAD stent in 2014, ischemic cardiac myopathy with last bony of 440-45%, paroxysmal atrial fibrillation not on anticoagulation, history of SVT presents to the hospital secondary to fall and right femoral intertrochanteric fracture.  #1 right femoral intertrochanteric fracture-appreciate orthopedic consult. -s/p ORIF POD # 3  -Continue pain control. DVT prophylaxis lovenox, - physical therapy - needs rehab at discharge  #2 CAD status post stent-also known ischemic cardiomyopathy with EF of 40-45% -Aspirin  restarted at discharge. LAD stent in 2014. Discussed with her primary cardiologist- can d/c brillinta at discharge for now -Continue carvedilol -Lasix restarted at discharge as needed  #3 Acute on chronic anemia of chronic disease- post op drop- received 1unit Tx due  to cardiac history - maintain hb >8 - on iron supplements.  #4 DVT prophylaxis - lovenox for 2 weeks  #5 Acute cystitis- positive urine analysis, started on Rocephin. WBC is elevated. Chest x-ray with no pneumonia -Encourage incentive spirometry -Received 2 doses of Rocephin here, discharged on Keflex.   Physical therapy consulted- to rehab today  DISCHARGE CONDITIONS:   Guarded  CONSULTS OBTAINED:   Treatment Team:  Claud Kelp, MD Hessie Knows, MD  DRUG ALLERGIES:   Allergies  Allergen Reactions  . Penicillins     Has patient had a PCN reaction causing immediate rash, facial/tongue/throat swelling, SOB or lightheadedness with hypotension: Unknown Has patient had a PCN reaction causing severe rash involving mucus membranes or skin necrosis: Unknown Has patient had a PCN reaction that required hospitalization No Has patient had a PCN reaction occurring within the last 10 years: No If all of the above answers are "NO", then may proceed with Cephalosporin use.   DISCHARGE MEDICATIONS:   Allergies as of 08/22/2017      Reactions   Penicillins    Has patient had a PCN reaction causing immediate rash, facial/tongue/throat swelling, SOB or lightheadedness with hypotension: Unknown Has patient had a PCN reaction causing severe rash involving mucus membranes or skin necrosis: Unknown Has patient had a PCN reaction that required hospitalization No Has patient had a PCN reaction occurring within the last 10 years: No If all of the above answers are "NO", then may proceed with Cephalosporin use.      Medication List    STOP taking these medications   BRILINTA 60 MG Tabs tablet Generic  drug:  ticagrelor   furosemide 20 MG tablet Commonly known as:  LASIX     TAKE these medications   alum & mag hydroxide-simeth 200-200-20 MG/5ML suspension Commonly known as:  MAALOX/MYLANTA Take 30 mLs by mouth every 4 (four) hours as needed for indigestion.   aspirin 81 MG  tablet Take 81 mg by mouth daily.   atorvastatin 20 MG tablet Commonly known as:  LIPITOR TAKE 1 TABLET BY MOUTH AT BEDTIME   carvedilol 3.125 MG tablet Commonly known as:  COREG TAKE ONE TABLET TWICE A DAY WITH MEALS.   cephALEXin 250 MG capsule Commonly known as:  KEFLEX Take 1 capsule (250 mg total) by mouth 3 (three) times daily.   cyanocobalamin 1000 MCG/ML injection Commonly known as:  (VITAMIN B-12) INJECT 1ML INTO THE MUCSLE EVERY 30 DAYS   docusate sodium 100 MG capsule Commonly known as:  COLACE Take 1 capsule (100 mg total) by mouth 2 (two) times daily.   enoxaparin 30 MG/0.3ML injection Commonly known as:  LOVENOX Inject 0.3 mLs (30 mg total) into the skin daily. X 2 weeks only   ferrous sulfate 325 (65 FE) MG tablet Commonly known as:  KP FERROUS SULFATE Take 1 tablet (325 mg total) by mouth 2 (two) times daily with a meal.   HYDROcodone-acetaminophen 5-325 MG tablet Commonly known as:  NORCO/VICODIN Take 1-2 tablets by mouth every 6 (six) hours as needed for moderate pain.   loratadine 10 MG tablet Commonly known as:  CLARITIN Take 1 tablet (10 mg total) by mouth daily as needed for allergies. What changed:  when to take this  reasons to take this   nitroGLYCERIN 0.4 MG SL tablet Commonly known as:  NITROSTAT Place 0.4 mg under the tongue every 5 (five) minutes as needed for chest pain.   vitamin C 1000 MG tablet Take 1,000 mg by mouth daily.        DISCHARGE INSTRUCTIONS:   1. PCP follow-up in 1-2 weeks 2. Orthopedics follow-up in 2 weeks  DIET:   Cardiac diet  ACTIVITY:   Activity as tolerated  OXYGEN:   Home Oxygen: No.  Oxygen Delivery: room air  DISCHARGE LOCATION:   nursing home   If you experience worsening of your admission symptoms, develop shortness of breath, life threatening emergency, suicidal or homicidal thoughts you must seek medical attention immediately by calling 911 or calling your MD immediately  if  symptoms less severe.  You Must read complete instructions/literature along with all the possible adverse reactions/side effects for all the Medicines you take and that have been prescribed to you. Take any new Medicines after you have completely understood and accpet all the possible adverse reactions/side effects.   Please note  You were cared for by a hospitalist during your hospital stay. If you have any questions about your discharge medications or the care you received while you were in the hospital after you are discharged, you can call the unit and asked to speak with the hospitalist on call if the hospitalist that took care of you is not available. Once you are discharged, your primary care physician will handle any further medical issues. Please note that NO REFILLS for any discharge medications will be authorized once you are discharged, as it is imperative that you return to your primary care physician (or establish a relationship with a primary care physician if you do not have one) for your aftercare needs so that they can reassess your need for medications and monitor your  lab values.    On the day of Discharge:  VITAL SIGNS:   Blood pressure (!) 158/66, pulse 95, temperature 98.3 F (36.8 C), temperature source Oral, resp. rate 20, height 5' (1.524 m), weight 44.5 kg (98 lb), SpO2 96 %.  PHYSICAL EXAMINATION:    GENERAL:  83 y.o.-year-old elderly patient lying in the bed with no acute distress.  EYES: Pupils equal, round, reactive to light and accommodation. No scleral icterus. Extraocular muscles intact.  HEENT: Head atraumatic, normocephalic. Oropharynx and nasopharynx clear.  NECK:  Supple, no jugular venous distention. No thyroid enlargement, no tenderness.  LUNGS: Normal breath sounds bilaterally, no wheezing, rales,rhonchi or crepitation. No use of accessory muscles of respiration. Decreased bibasilar breath sounds CARDIOVASCULAR: S1, S2 normal. No  rubs, or gallops. 2/6  systolic murmur is present ABDOMEN: Soft, nontender, nondistended. Bowel sounds present. No organomegaly or mass.  EXTREMITIES: Right lateral thigh dressing with minimal dried blood, subcutaneous edema noted. No pedal edema, cyanosis, or clubbing.  NEUROLOGIC: Cranial nerves II through XII are intact. Muscle strength 5/5 in all extremities. Sensation intact. Gait not checked.  PSYCHIATRIC: The patient is alert and oriented x 3.  SKIN: No obvious rash, lesion, or ulcer.   DATA REVIEW:   CBC  Recent Labs Lab 08/22/17 0431  WBC 18.2*  HGB 9.0*  HCT 26.7*  PLT 203    Chemistries   Recent Labs Lab 08/22/17 0431  NA 136  K 3.7  CL 109  CO2 20*  GLUCOSE 102*  BUN 33*  CREATININE 1.18*  CALCIUM 7.3*     Microbiology Results  Results for orders placed or performed during the hospital encounter of 08/17/17  Urine Culture     Status: Abnormal   Collection Time: 08/20/17  8:18 AM  Result Value Ref Range Status   Specimen Description URINE, RANDOM  Final   Special Requests NONE  Final   Culture MULTIPLE SPECIES PRESENT, SUGGEST RECOLLECTION (A)  Final   Report Status 08/21/2017 FINAL  Final    RADIOLOGY:  No results found.   Management plans discussed with the patient, family and they are in agreement.  CODE STATUS:     Code Status Orders        Start     Ordered   08/19/17 0845  Do not attempt resuscitation (DNR)  Continuous    Question Answer Comment  In the event of cardiac or respiratory ARREST Do not call a "code blue"   In the event of cardiac or respiratory ARREST Do not perform Intubation, CPR, defibrillation or ACLS   In the event of cardiac or respiratory ARREST Use medication by any route, position, wound care, and other measures to relive pain and suffering. May use oxygen, suction and manual treatment of airway obstruction as needed for comfort.   Comments Gold document on chart - dated 04/05/17      08/19/17 0846    Code Status History    Date  Active Date Inactive Code Status Order ID Comments User Context   08/17/2017  9:28 PM 08/19/2017  8:46 AM Full Code [id number]  Henreitta Leber, MD Inpatient   09/24/2015  8:35 AM 09/26/2015  7:20 PM DNR [id number]  Max Sane, MD Inpatient   09/23/2015 12:03 PM 09/24/2015  8:35 AM Full Code [id number]  Bettey Costa, MD Inpatient    Advance Directive Documentation     Most Recent Value  Type of Advance Directive  Healthcare Power of Attorney  Pre-existing out of facility DNR order (  yellow form or pink MOST form)  -  "MOST" Form in Place?  -      TOTAL TIME TAKING CARE OF THIS PATIENT: 37 minutes.    Tanayah Squitieri M.D on 08/22/2017 at 1:05 PM  Between 7am to 6pm - Pager - [phone]  After 6pm go to [email or web address] - Technical brewer [facility]  Office  [phone]  CC: Primary care physician; Leone Haven, MD   Note: This dictation was prepared with Dragon dictation along with smaller phrase technology. Any transcriptional errors that result from this process are unintentional.

## 2017-08-22 NOTE — Telephone Encounter (Signed)
[facility] will follow for TCM.  Patient was scheduled for hospital FU for 08/30/17 by front staff .

## 2017-08-22 NOTE — Progress Notes (Signed)
Report called to [facility]. Spoke with [employer]. Printed prescriptions for Lovenox and Norco in Psychologist, counselling along with AVS  EMS called for transportation.  Wynema Birch, RN

## 2017-08-22 NOTE — Progress Notes (Addendum)
   Subjective: 3 Days Post-Op Procedure(s) (LRB): INTRAMEDULLARY (IM) NAIL INTERTROCHANTRIC (Right) Patient reports pain as 0 on 0-10 scale. Moderate pain with right hip movement only. Patient is well, and has had no acute complaints or problems Denies any CP, SOB, ABD pain. We will continue therapy today.  .  Objective: Vital signs in last 24 hours: Temp:  [97.6 F (36.4 C)-98.6 F (37 C)] 98.3 F (36.8 C) (10/25 0732) Pulse Rate:  [93-104] 95 (10/25 0732) Resp:  [18-20] 20 (10/25 0732) BP: (134-170)/(44-66) 158/66 (10/25 0732) SpO2:  [95 %-100 %] 96 % (10/25 0732)  Intake/Output from previous day: 10/24 0701 - 10/25 0700 In: 772 [P.O.:360; Blood:362; IV Piggyback:50] Out: -  Intake/Output this shift: No intake/output data recorded.   Recent Labs  08/19/17 1915 08/20/17 0527 08/21/17 0605 08/21/17 1717 08/22/17 0431  HGB 9.1* 8.6* 7.2* 9.9* 9.0*    Recent Labs  08/21/17 0605 08/21/17 1717 08/22/17 0431  WBC 19.8*  --  18.2*  RBC 2.36*  --  3.00*  HCT 21.9* 29.7* 26.7*  PLT 226  --  203    Recent Labs  08/21/17 0605 08/22/17 0431  NA 132* 136  K 4.1 3.7  CL 105 109  CO2 20* 20*  BUN 40* 33*  CREATININE 1.74* 1.18*  GLUCOSE 120* 102*  CALCIUM 7.5* 7.3*   No results for input(s): LABPT, INR in the last 72 hours.  EXAM General - Patient is Alert, Appropriate and Oriented Extremity - Neurovascular intact Sensation intact distally Intact pulses distally Dorsiflexion/Plantar flexion intact No cellulitis present Compartment soft Dressing - dressing C/D/I and scant drainage Motor Function - intact, moving foot and toes well on exam.   Past Medical History:  Diagnosis Date  . Coronary artery disease 03/06/13   90% prox LAD stenosis s/p DES  . Difficult intubation   . Dizziness   . GERD (gastroesophageal reflux disease)   . Hypotension   . Ischemic cardiomyopathy 03/09/13   s/p NSTEMI. EF 30-35%.  . MI (myocardial infarction) ([place])   . PAF  (paroxysmal atrial fibrillation) ([place]) 03/06/13   In setting of NSTEMI and reperfusion, no recurrence  . SVT (supraventricular tachycardia) (HCC)    Self-limited on outpatient cardiac monitor    Assessment/Plan:   3 Days Post-Op Procedure(s) (LRB): INTRAMEDULLARY (IM) NAIL INTERTROCHANTRIC (Right) Active Problems:   Closed right hip fracture (HCC)  Estimated body mass index is 19.14 kg/m as calculated from the following:   Height as of this encounter: 5' (1.524 m).   Weight as of this encounter: 44.5 kg (98 lb). Advance diet Up with therapy  Acute post op blood loss anemia - Hgb 9.0. Improved from 7.2. S/P 1 unit PRBC Lovenox 30 mg SQ daily x 14 days Staple removal and steri strip application 16/10/96 Follow up with [facility] in 6 weeks for recheck   DVT Prophylaxis - Lovenox, Foot Pumps and TED hose Weight-Bearing as tolerated to right leg   T. Rachelle Hora, PA-C [place] 08/22/2017, 7:57 AM

## 2017-08-23 ENCOUNTER — Other Ambulatory Visit: Payer: Self-pay

## 2017-08-23 DIAGNOSIS — I25119 Atherosclerotic heart disease of native coronary artery with unspecified angina pectoris: Secondary | ICD-10-CM | POA: Diagnosis not present

## 2017-08-23 DIAGNOSIS — I255 Ischemic cardiomyopathy: Secondary | ICD-10-CM | POA: Diagnosis not present

## 2017-08-23 DIAGNOSIS — M8000XA Age-related osteoporosis with current pathological fracture, unspecified site, initial encounter for fracture: Secondary | ICD-10-CM | POA: Diagnosis not present

## 2017-08-23 MED ORDER — HYDROCODONE-ACETAMINOPHEN 5-325 MG PO TABS: 1.0000 | ORAL_TABLET | Freq: Four times a day (QID) | ORAL | 0 refills | Status: DC | PRN

## 2017-08-23 NOTE — Telephone Encounter (Signed)
Rx sent to [facility] phone : [phone] , fax : [phone]  

## 2017-08-24 ENCOUNTER — Other Ambulatory Visit
Admission: RE | Admit: 2017-08-24 | Discharge: 2017-08-24 | Disposition: A | Discharge status: Home / Self Care | Source: Other Acute Inpatient Hospital | Attending: Internal Medicine | Admitting: Internal Medicine

## 2017-08-24 DIAGNOSIS — I2510 Atherosclerotic heart disease of native coronary artery without angina pectoris: Secondary | ICD-10-CM | POA: Insufficient documentation

## 2017-08-24 LAB — CBC WITH DIFFERENTIAL/PLATELET
Basophils Absolute: 0.1 10*3/uL (ref 0–0.1)
Basophils Relative: 0 %
EOS ABS: 0.5 10*3/uL (ref 0–0.7)
Eosinophils Relative: 3 %
HEMATOCRIT: 32.6 % — AB (ref 35.0–47.0)
HEMOGLOBIN: 11 g/dL — AB (ref 12.0–16.0)
LYMPHS ABS: 1.5 10*3/uL (ref 1.0–3.6)
LYMPHS PCT: 9 %
MCH: 30.5 pg (ref 26.0–34.0)
MCHC: 33.6 g/dL (ref 32.0–36.0)
MCV: 90.7 fL (ref 80.0–100.0)
Monocytes Absolute: 2 10*3/uL — ABNORMAL HIGH (ref 0.2–0.9)
Monocytes Relative: 11 %
NEUTROS ABS: 13.4 10*3/uL — AB (ref 1.4–6.5)
NEUTROS PCT: 77 %
Platelets: 304 10*3/uL (ref 150–440)
RBC: 3.59 MIL/uL — AB (ref 3.80–5.20)
RDW: 15.3 % — ABNORMAL HIGH (ref 11.5–14.5)
WBC: 17.5 10*3/uL — AB (ref 3.6–11.0)

## 2017-08-24 LAB — COMPREHENSIVE METABOLIC PANEL
ALK PHOS: 71 U/L (ref 38–126)
ALT: 22 U/L (ref 14–54)
AST: 34 U/L (ref 15–41)
Albumin: 2.4 g/dL — ABNORMAL LOW (ref 3.5–5.0)
Anion gap: 10 (ref 5–15)
BILIRUBIN TOTAL: 2.4 mg/dL — AB (ref 0.3–1.2)
BUN: 22 mg/dL — ABNORMAL HIGH (ref 6–20)
CALCIUM: 8.2 mg/dL — AB (ref 8.9–10.3)
CO2: 21 mmol/L — AB (ref 22–32)
CREATININE: 0.87 mg/dL (ref 0.44–1.00)
Chloride: 105 mmol/L (ref 101–111)
GFR calc non Af Amer: 57 mL/min — ABNORMAL LOW (ref >60)
Glucose, Bld: 94 mg/dL (ref 65–99)
Potassium: 4.6 mmol/L (ref 3.5–5.1)
SODIUM: 136 mmol/L (ref 135–145)
Total Protein: 5.1 g/dL — ABNORMAL LOW (ref 6.5–8.1)

## 2017-08-24 LAB — OCCULT BLOOD X 1 CARD TO LAB, STOOL: Fecal Occult Bld: NEGATIVE

## 2017-08-24 NOTE — Telephone Encounter (Signed)
Noted  

## 2017-08-25 LAB — TYPE AND SCREEN
ABO/RH(D): O POS
Antibody Screen: POSITIVE
Donor AG Type: NEGATIVE
Donor AG Type: NEGATIVE
PT AG Type: POSITIVE
UNIT DIVISION: 0
Unit division: 0
Unit division: 0

## 2017-08-25 LAB — BPAM RBC
BLOOD PRODUCT EXPIRATION DATE: 201811152359
BLOOD PRODUCT EXPIRATION DATE: 201811152359
Blood Product Expiration Date: 201811142359
ISSUE DATE / TIME: 201810241308
UNIT TYPE AND RH: 5100
Unit Type and Rh: 5100
Unit Type and Rh: 5100

## 2017-08-26 ENCOUNTER — Other Ambulatory Visit
Admission: RE | Admit: 2017-08-26 | Discharge: 2017-08-26 | Disposition: A | Discharge status: Home / Self Care | Source: Skilled Nursing Facility | Attending: Gerontology | Admitting: Gerontology

## 2017-08-26 DIAGNOSIS — K567 Ileus, unspecified: Secondary | ICD-10-CM | POA: Insufficient documentation

## 2017-08-26 LAB — CBC WITH DIFFERENTIAL/PLATELET
BASOS PCT: 0 %
Band Neutrophils: 1 %
Basophils Absolute: 0 10*3/uL (ref 0–0.1)
Blasts: 0 %
EOS PCT: 2 %
Eosinophils Absolute: 0.3 10*3/uL (ref 0–0.7)
HEMATOCRIT: 27.5 % — AB (ref 35.0–47.0)
Hemoglobin: 9.3 g/dL — ABNORMAL LOW (ref 12.0–16.0)
LYMPHS PCT: 20 %
Lymphs Abs: 3.5 10*3/uL (ref 1.0–3.6)
MCH: 31 pg (ref 26.0–34.0)
MCHC: 34 g/dL (ref 32.0–36.0)
MCV: 91.3 fL (ref 80.0–100.0)
MONO ABS: 1.9 10*3/uL — AB (ref 0.2–0.9)
MYELOCYTES: 0 %
Metamyelocytes Relative: 0 %
Monocytes Relative: 11 %
NEUTROS PCT: 66 %
NRBC: 0 /100{WBCs}
Neutro Abs: 11.6 10*3/uL — ABNORMAL HIGH (ref 1.4–6.5)
OTHER: 0 %
PLATELETS: 319 10*3/uL (ref 150–440)
PROMYELOCYTES ABS: 0 %
RBC: 3.01 MIL/uL — AB (ref 3.80–5.20)
RDW: 15.6 % — ABNORMAL HIGH (ref 11.5–14.5)
WBC: 17.3 10*3/uL — AB (ref 3.6–11.0)

## 2017-08-26 LAB — BASIC METABOLIC PANEL
ANION GAP: 5 (ref 5–15)
BUN: 25 mg/dL — AB (ref 6–20)
CHLORIDE: 110 mmol/L (ref 101–111)
CO2: 21 mmol/L — AB (ref 22–32)
Calcium: 7.4 mg/dL — ABNORMAL LOW (ref 8.9–10.3)
Creatinine, Ser: 0.91 mg/dL (ref 0.44–1.00)
GFR calc Af Amer: >60 mL/min (ref >60)
GFR, EST NON AFRICAN AMERICAN: 54 mL/min — AB (ref >60)
GLUCOSE: 103 mg/dL — AB (ref 65–99)
POTASSIUM: 4.1 mmol/L (ref 3.5–5.1)
Sodium: 136 mmol/L (ref 135–145)

## 2017-08-29 ENCOUNTER — Encounter: Payer: Self-pay | Admitting: Gerontology

## 2017-08-29 ENCOUNTER — Non-Acute Institutional Stay (SKILLED_NURSING_FACILITY): Payer: Medicare Other | Admitting: Gerontology

## 2017-08-29 ENCOUNTER — Encounter
Admission: RE | Admit: 2017-08-29 | Discharge: 2017-08-29 | Disposition: A | Discharge status: Home / Self Care | Payer: Medicare Other | Source: Ambulatory Visit | Attending: Internal Medicine | Admitting: Internal Medicine

## 2017-08-29 DIAGNOSIS — S72001D Fracture of unspecified part of neck of right femur, subsequent encounter for closed fracture with routine healing: Secondary | ICD-10-CM | POA: Diagnosis not present

## 2017-08-29 NOTE — Progress Notes (Signed)
Location:   [facility] Room Number: 729M Place of Service:  SNF [id number]) Provider:  Toni Arthurs, NP-C  Leone Haven, MD  Patient Care Team: Leone Haven, MD as PCP - General (Family Medicine) Minna Merritts, MD as Consulting Physician (Cardiology)  Extended Emergency Contact Information Primary Emergency Contact: Rodriguez,Kathy Address: [address]          [place], [place] [address] [name] Phone: [phone] Relation: Daughter Secondary Emergency Contact: Rodriguez,Ray Address: [address]          [address]          [place], [place] [address] [place] Phone: [phone] Relation: Relative  Code Status:  DNR Goals of care: Advanced Directive information Advanced Directives 08/29/2017  Does Patient Have a Medical Advance Directive? Yes  Type of Advance Directive Out of facility DNR (pink MOST or yellow form)  Does patient want to make changes to medical advance directive? No - Patient declined  Copy of [place] in Chart? -  Would patient like information on creating a medical advance directive? -     Chief Complaint  Patient presents with  . Medical Management of Chronic Issues    Routine Visit     HPI:  Pt is a 83 y.o. Hart seen today for follow-up status post admission to facility for rehab following [facility] to [facility] after fall with a closed right hip fracture with surgical intervention.  Patient has been participating in PT and OT.  Patient reports her pain is well controlled on current regimen.  Patient reports appetite is fair.  She is on Ensure nutritional supplement.  Voiding without difficulty and having regular BMs.  Patient denies n/v/d/f/c/cp/sob/ha/abd pain/dizziness/cough/congestion.  Vital signs stable.  No other complaints.   Past Medical History:  Diagnosis Date  . Coronary artery disease 03/06/13   90% prox LAD stenosis s/p DES  . Difficult  intubation   . Dizziness   . GERD (gastroesophageal reflux disease)   . Hypotension   . Ischemic cardiomyopathy 03/09/13   s/p NSTEMI. EF 30-35%.  . MI (myocardial infarction) ([place])   . PAF (paroxysmal atrial fibrillation) ([place]) 03/06/13   In setting of NSTEMI and reperfusion, no recurrence  . SVT (supraventricular tachycardia) (HCC)    Self-limited on outpatient cardiac monitor   Past Surgical History:  Procedure Laterality Date  . CESAREAN SECTION    . CORONARY ANGIOPLASTY WITH STENT PLACEMENT  02/2013   DES-mid LAD, mild LCx, RCA stenoses  . INTRAMEDULLARY (IM) NAIL INTERTROCHANTERIC Right 08/19/2017   Procedure: INTRAMEDULLARY (IM) NAIL INTERTROCHANTRIC;  Surgeon: Hessie Knows, MD;  Location: [facility] ORS;  Service: Orthopedics;  Laterality: Right;    Allergies  Allergen Reactions  . Penicillins     Has patient had a PCN reaction causing immediate rash, facial/tongue/throat swelling, SOB or lightheadedness with hypotension: Unknown Has patient had a PCN reaction causing severe rash involving mucus membranes or skin necrosis: Unknown Has patient had a PCN reaction that required hospitalization No Has patient had a PCN reaction occurring within the last 10 years: No If all of the above answers are "NO", then may proceed with Cephalosporin use.    Allergies as of 08/29/2017      Reactions   Penicillins    Has patient had a PCN reaction causing immediate rash, facial/tongue/throat swelling, SOB or lightheadedness with hypotension: Unknown Has patient had a PCN reaction causing severe rash involving mucus membranes or  skin necrosis: Unknown Has patient had a PCN reaction that required hospitalization No Has patient had a PCN reaction occurring within the last 10 years: No If all of the above answers are "NO", then may proceed with Cephalosporin use.      Medication List       Accurate as of 08/29/17  3:18 PM. Always use your most recent med list.          alum & mag  hydroxide-simeth 200-200-20 MG/5ML suspension Commonly known as:  MAALOX/MYLANTA Take 30 mLs by mouth every 4 (four) hours as needed for indigestion.   atorvastatin 20 MG tablet Commonly known as:  LIPITOR TAKE 1 TABLET BY MOUTH AT BEDTIME   BREO ELLIPTA 100-25 MCG/INH Aepb Generic drug:  fluticasone furoate-vilanterol Inhale 1 puff into the lungs daily.   carvedilol 3.125 MG tablet Commonly known as:  COREG TAKE ONE TABLET TWICE A DAY WITH MEALS.   cephALEXin 250 MG capsule Commonly known as:  KEFLEX Take 250 mg by mouth 3 (three) times daily.   docusate sodium 100 MG capsule Commonly known as:  COLACE Take 1 capsule (100 mg total) by mouth 2 (two) times daily.   enoxaparin 30 MG/0.3ML injection Commonly known as:  LOVENOX Inject 0.3 mLs (30 mg total) into the skin daily. X 2 weeks only   ENSURE ENLIVE PO Take 1 Bottle by mouth 2 (two) times daily between meals.   feeding supplement (PRO-STAT SUGAR FREE 64) Liqd Take 30 mLs by mouth 2 (two) times daily between meals.   ferrous sulfate 325 (65 FE) MG tablet Commonly known as:  KP FERROUS SULFATE Take 1 tablet (325 mg total) by mouth 2 (two) times daily with a meal.   HYDROcodone-acetaminophen 5-325 MG tablet Commonly known as:  NORCO/VICODIN Take 1-2 tablets by mouth every 6 (six) hours as needed for moderate pain.   ipratropium-albuterol 0.5-2.5 (3) MG/3ML Soln Commonly known as:  DUONEB Take 3 mLs by nebulization 3 (three) times daily.   loratadine 10 MG tablet Commonly known as:  CLARITIN Take 1 tablet (10 mg total) by mouth daily as needed for allergies.   nitroGLYCERIN 0.4 MG SL tablet Commonly known as:  NITROSTAT Place 0.4 mg under the tongue every 5 (five) minutes as needed for chest pain.   ranitidine 75 MG tablet Commonly known as:  ZANTAC Take 75 mg by mouth 2 (two) times daily.   vitamin C 1000 MG tablet Take 1,000 mg by mouth daily.       Review of Systems  Constitutional: Negative for  activity change, appetite change, chills, diaphoresis and fever.  HENT: Negative for congestion, sneezing, sore throat, trouble swallowing and voice change.   Respiratory: Negative for apnea, cough, choking, chest tightness, shortness of breath and wheezing.   Cardiovascular: Negative for chest pain, palpitations and leg swelling.  Gastrointestinal: Negative for abdominal distention, abdominal pain, constipation, diarrhea and nausea.  Genitourinary: Negative for difficulty urinating, dysuria, frequency and urgency.  Musculoskeletal: Positive for arthralgias (typical arthritis) and myalgias. Negative for back pain and gait problem.  Skin: Positive for wound. Negative for color change, pallor and rash.  Neurological: Negative for dizziness, tremors, syncope, speech difficulty, weakness, numbness and headaches.  Psychiatric/Behavioral: Negative for agitation and behavioral problems.  All other systems reviewed and are negative.   Immunization History  Administered Date(s) Administered  . Influenza-Unspecified 07/30/2014, 07/17/2017  . Pneumococcal Conjugate-13 08/10/2014   Pertinent  Health Maintenance Due  Topic Date Due  . PNA vac Low Risk Adult (  2 of 2 - PPSV23) 08/11/2015  . INFLUENZA VACCINE  Completed  . DEXA SCAN  Completed   Fall Risk  11/26/2016  Falls in the past year? No   Functional Status Survey:    Vitals:   08/29/17 1515  BP: (!) 117/91  Pulse: 70  Resp: 18  Temp: 98.5 F (36.9 C)  SpO2: 98%  Weight: 99 lb 11.2 oz (45.2 kg)  Height: 5' (1.524 m)   Body mass index is 19.47 kg/m. Physical Exam  Constitutional: She is oriented to person, place, and time. Vital signs are normal. She appears well-developed and well-nourished. She is active and cooperative. She does not appear ill. No distress.  HENT:  Head: Normocephalic and atraumatic.  Mouth/Throat: Uvula is midline, oropharynx is clear and moist and mucous membranes are normal. Mucous membranes are not pale,  not dry and not cyanotic.  Eyes: Conjunctivae, EOM and lids are normal. Pupils are equal, round, and reactive to light.  Neck: Trachea normal, normal range of motion and full passive range of motion without pain. Neck supple. No JVD present. No tracheal deviation, no edema and no erythema present. No thyromegaly present.  Cardiovascular: Normal rate, regular rhythm, normal heart sounds, intact distal pulses and normal pulses. Exam reveals no gallop, no distant heart sounds and no friction rub.  No murmur heard. Pulses:      Dorsalis pedis pulses are 2+ on the right side, and 2+ on the left side.  No edema  Pulmonary/Chest: Effort normal and breath sounds normal. No accessory muscle usage. No respiratory distress. She has no decreased breath sounds. She has no wheezes. She has no rhonchi. She has no rales. She exhibits no tenderness.  Abdominal: Soft. Normal appearance and bowel sounds are normal. She exhibits no distension and no ascites. There is no tenderness.  Musculoskeletal: She exhibits no edema or tenderness.       Right hip: She exhibits decreased range of motion, decreased strength and laceration.  Expected osteoarthritis, stiffness.  Calves soft, supple.  Negative Homans sign  Neurological: She is alert and oriented to person, place, and time. She has normal strength.  Skin: Skin is warm and dry. Laceration noted. She is not diaphoretic. No cyanosis. No pallor. Nails show no clubbing.  Psychiatric: She has a normal mood and affect. Her speech is normal and behavior is normal. Judgment and thought content normal. Cognition and memory are impaired. She exhibits abnormal recent memory.  Nursing note and vitals reviewed.   Labs reviewed:  Recent Labs  08/22/17 0431 08/24/17 1200 08/26/17 0450  NA 136 136 136  K 3.7 4.6 4.1  CL 109 105 110  CO2 20* 21* 21*  GLUCOSE 102* 94 103*  BUN 33* 22* 25*  CREATININE 1.18* 0.87 0.91  CALCIUM 7.3* 8.2* 7.4*    Recent Labs   08/24/17 1200  AST 34  ALT 22  ALKPHOS 71  BILITOT 2.4*  PROT 5.1*  ALBUMIN 2.4*    Recent Labs  08/22/17 0431 08/24/17 1200 08/26/17 0450  WBC 18.2* 17.5* 17.3*  NEUTROABS  --  13.4* 11.6*  HGB 9.0* 11.0* 9.3*  HCT 26.7* 32.6* 27.5*  MCV 89.1 90.7 91.3  PLT 203 304 319   Lab Results  Component Value Date   TSH 6.520 (H) 09/23/2015   Lab Results  Component Value Date   HGBA1C 5.9 09/23/2015   Lab Results  Component Value Date   CHOL 120 09/19/2015   HDL Linda.60 09/19/2015   LDLCALC 48 09/19/2015  LDLDIRECT 60.0 08/23/2016   TRIG 60.0 09/19/2015   CHOLHDL 2 09/19/2015    Significant Diagnostic Results in last 30 days:  Ct Head Wo Contrast  Result Date: 08/17/2017 CLINICAL DATA:  Patient does not pick up her feet when she walks. Tripped over the rock. Right hip pain. Right hip fracture. EXAM: CT HEAD WITHOUT CONTRAST TECHNIQUE: Contiguous axial images were obtained from the base of the skull through the vertex without intravenous contrast. COMPARISON:  12/05/2011 FINDINGS: Brain: Diffuse cerebral atrophy. Mild ventricular dilatation consistent with central atrophy. Low-attenuation changes in the deep white matter consistent with small vessel ischemia. Basal ganglia calcifications. No mass effect or midline shift. No abnormal extra-axial fluid collections. Gray-white matter junctions are distinct. Basal cisterns are not effaced. No acute intracranial hemorrhage. Vascular: Vascular calcifications are present in the internal carotid and vertebrobasilar vessels. Skull: Calvarium appears intact. No acute depressed skull fractures. Focal lucency in the left anterior frontal region is unchanged since previous study and probably represents a hemangioma. Sinuses/Orbits: Acute appearing nasal bone fractures. Paranasal sinuses are clear. Mastoid air cells are clear. Other: None. IMPRESSION: 1. No acute intracranial abnormalities. 2. Chronic atrophy and small vessel ischemic changes. 3.  Nasal bone fractures with mild depression. 4. Vascular calcifications. Electronically Signed   By: Lucienne Capers M.D.   On: 08/17/2017 21:21   Dg Chest Portable 1 View  Result Date: 08/17/2017 CLINICAL DATA:  Preoperative evaluation, known right femoral fracture EXAM: PORTABLE CHEST 1 VIEW COMPARISON:  09/25/2015 FINDINGS: Cardiac shadow is within normal limits. Mild aortic calcifications are seen. Hyperinflation of the lungs is noted. No acute infiltrate or sizable effusion is seen. No bony abnormality is noted. IMPRESSION: COPD without acute abnormality. Electronically Signed   By: Inez Catalina M.D.   On: 08/17/2017 19:27   Dg Hip Operative Unilat W Or W/o Pelvis Right  Result Date: 08/19/2017 CLINICAL DATA:  Intertrochanteric fracture of the proximal right femur. EXAM: OPERATIVE RIGHT HIP (WITH PELVIS IF PERFORMED) 2 VIEWS TECHNIQUE: Fluoroscopic spot image(s) were submitted for interpretation post-operatively. COMPARISON:  RADIOGRAPHS DATED 08/17/2017 FINDINGS: AP and lateral C-arm images demonstrate the patient has undergone open reduction and internal fixation of the intertrochanteric fracture. Intramedullary nail and lag screw in place, appear in good position. IMPRESSION: Open reduction and internal fixation of intertrochanteric fracture of the proximal right femur. Electronically Signed   By: Lorriane Shire M.D.   On: 08/19/2017 15:33   Dg Hip Unilat  With Pelvis 2-3 Views Right  Result Date: 08/17/2017 CLINICAL DATA:  Recent fall with right leg pain, initial encounter EXAM: DG HIP (WITH OR WITHOUT PELVIS) 2-3V RIGHT COMPARISON:  None. FINDINGS: Pelvic ring is intact. There is an intratrochanteric right femoral fracture with some impaction and angulation at the fracture site. No gross soft tissue abnormality is seen. No dislocation is noted. IMPRESSION: Right intratrochanteric femoral fracture. Electronically Signed   By: Inez Catalina M.D.   On: 08/17/2017 19:25    Assessment/Plan 1.   Closed fracture of right hip with routine healing, subsequent encounter  Continue PT/OT  Continue exercises as taught by PT/OT  Continue Lovenox 30 mg subcu daily for DVT prophylaxis  Continue Norco 5/325 milligrams 1-2 tablets p.o. every 6 hours as needed pain  Ice pack to the hip as needed pain or edema  Skin care per protocol  Follow-up with orthopedist as instructed  Family/ staff Communication:   Total Time:  Documentation:  Face to Face:  Family/Phone:   Labs/tests ordered: CBC, met B  Medication list reviewed and assessed for continued appropriateness. Monthly medication orders reviewed and signed.  Vikki Ports, NP-C Geriatrics [facility] [phone], [place] [address] Cell Phone (Mon-Fri 8am-5pm):  [phone] On Call:  [phone] & follow prompts after 5pm & weekends Office Phone:  [phone] Office Fax:  [phone]

## 2017-08-30 ENCOUNTER — Other Ambulatory Visit
Admission: RE | Admit: 2017-08-30 | Discharge: 2017-08-30 | Disposition: A | Discharge status: Home / Self Care | Payer: Medicare Other | Source: Skilled Nursing Facility | Attending: Gerontology | Admitting: Gerontology

## 2017-08-30 DIAGNOSIS — D638 Anemia in other chronic diseases classified elsewhere: Secondary | ICD-10-CM | POA: Insufficient documentation

## 2017-08-30 LAB — BASIC METABOLIC PANEL
ANION GAP: 7 (ref 5–15)
BUN: 28 mg/dL — ABNORMAL HIGH (ref 6–20)
CALCIUM: 8.3 mg/dL — AB (ref 8.9–10.3)
CO2: 22 mmol/L (ref 22–32)
Chloride: 108 mmol/L (ref 101–111)
Creatinine, Ser: 1.02 mg/dL — ABNORMAL HIGH (ref 0.44–1.00)
GFR, EST AFRICAN AMERICAN: 55 mL/min — AB (ref >60)
GFR, EST NON AFRICAN AMERICAN: 47 mL/min — AB (ref >60)
Glucose, Bld: 103 mg/dL — ABNORMAL HIGH (ref 65–99)
Potassium: 4.1 mmol/L (ref 3.5–5.1)
Sodium: 137 mmol/L (ref 135–145)

## 2017-08-30 LAB — CBC WITH DIFFERENTIAL/PLATELET
BASOS ABS: 0.1 10*3/uL (ref 0–0.1)
BASOS PCT: 1 %
EOS ABS: 0.4 10*3/uL (ref 0–0.7)
EOS PCT: 2 %
HCT: 33 % — ABNORMAL LOW (ref 35.0–47.0)
Hemoglobin: 10.7 g/dL — ABNORMAL LOW (ref 12.0–16.0)
Lymphocytes Relative: 7 %
Lymphs Abs: 1.2 10*3/uL (ref 1.0–3.6)
MCH: 31.5 pg (ref 26.0–34.0)
MCHC: 32.6 g/dL (ref 32.0–36.0)
MCV: 96.6 fL (ref 80.0–100.0)
MONO ABS: 1.3 10*3/uL — AB (ref 0.2–0.9)
Monocytes Relative: 8 %
Neutro Abs: 13.9 10*3/uL — ABNORMAL HIGH (ref 1.4–6.5)
Neutrophils Relative %: 82 %
PLATELETS: 538 10*3/uL — AB (ref 150–440)
RBC: 3.41 MIL/uL — ABNORMAL LOW (ref 3.80–5.20)
RDW: 19.1 % — AB (ref 11.5–14.5)
WBC: 16.8 10*3/uL — ABNORMAL HIGH (ref 3.6–11.0)

## 2017-09-02 ENCOUNTER — Other Ambulatory Visit
Admission: RE | Admit: 2017-09-02 | Discharge: 2017-09-02 | Disposition: A | Discharge status: Home / Self Care | Source: Ambulatory Visit | Attending: Gerontology | Admitting: Gerontology

## 2017-09-02 ENCOUNTER — Non-Acute Institutional Stay (SKILLED_NURSING_FACILITY): Payer: Medicare Other | Admitting: Gerontology

## 2017-09-02 DIAGNOSIS — J189 Pneumonia, unspecified organism: Secondary | ICD-10-CM

## 2017-09-02 DIAGNOSIS — J181 Lobar pneumonia, unspecified organism: Secondary | ICD-10-CM

## 2017-09-02 DIAGNOSIS — S72001D Fracture of unspecified part of neck of right femur, subsequent encounter for closed fracture with routine healing: Secondary | ICD-10-CM

## 2017-09-02 DIAGNOSIS — S72141D Displaced intertrochanteric fracture of right femur, subsequent encounter for closed fracture with routine healing: Secondary | ICD-10-CM | POA: Insufficient documentation

## 2017-09-02 DIAGNOSIS — X58XXXD Exposure to other specified factors, subsequent encounter: Secondary | ICD-10-CM | POA: Insufficient documentation

## 2017-09-02 LAB — CBC WITH DIFFERENTIAL/PLATELET
Basophils Absolute: 0.1 10*3/uL (ref 0–0.1)
Basophils Relative: 1 %
Eosinophils Absolute: 0.2 10*3/uL (ref 0–0.7)
Eosinophils Relative: 2 %
HEMATOCRIT: 35.8 % (ref 35.0–47.0)
HEMOGLOBIN: 11.6 g/dL — AB (ref 12.0–16.0)
LYMPHS ABS: 1.1 10*3/uL (ref 1.0–3.6)
Lymphocytes Relative: 9 %
MCH: 31.3 pg (ref 26.0–34.0)
MCHC: 32.4 g/dL (ref 32.0–36.0)
MCV: 96.8 fL (ref 80.0–100.0)
MONOS PCT: 8 %
Monocytes Absolute: 0.9 10*3/uL (ref 0.2–0.9)
NEUTROS ABS: 9.4 10*3/uL — AB (ref 1.4–6.5)
NEUTROS PCT: 80 %
Platelets: 554 10*3/uL — ABNORMAL HIGH (ref 150–440)
RBC: 3.7 MIL/uL — ABNORMAL LOW (ref 3.80–5.20)
RDW: 19.3 % — ABNORMAL HIGH (ref 11.5–14.5)
WBC: 11.7 10*3/uL — ABNORMAL HIGH (ref 3.6–11.0)

## 2017-09-02 LAB — COMPREHENSIVE METABOLIC PANEL
ALK PHOS: 111 U/L (ref 38–126)
ALT: 24 U/L (ref 14–54)
ANION GAP: 8 (ref 5–15)
AST: 36 U/L (ref 15–41)
Albumin: 2.7 g/dL — ABNORMAL LOW (ref 3.5–5.0)
BILIRUBIN TOTAL: 1.3 mg/dL — AB (ref 0.3–1.2)
BUN: 30 mg/dL — ABNORMAL HIGH (ref 6–20)
CALCIUM: 8.6 mg/dL — AB (ref 8.9–10.3)
CO2: 24 mmol/L (ref 22–32)
Chloride: 106 mmol/L (ref 101–111)
Creatinine, Ser: 0.89 mg/dL (ref 0.44–1.00)
GFR calc non Af Amer: 56 mL/min — ABNORMAL LOW (ref >60)
Glucose, Bld: 127 mg/dL — ABNORMAL HIGH (ref 65–99)
Potassium: 4.4 mmol/L (ref 3.5–5.1)
Sodium: 138 mmol/L (ref 135–145)
TOTAL PROTEIN: 6 g/dL — AB (ref 6.5–8.1)

## 2017-09-03 ENCOUNTER — Encounter: Payer: Self-pay | Admitting: Gerontology

## 2017-09-03 NOTE — Progress Notes (Addendum)
Location:   [facility] Room Number: 101B Place of Service:  SNF [phone]) Provider:  Toni Arthurs, NP-C  Leone Haven, MD  Patient Care Team: Leone Haven, MD as PCP - General (Family Medicine) Minna Merritts, MD as Consulting Physician (Cardiology)  Extended Emergency Contact Information Primary Emergency Contact: Rodriguez,Kathy Address: [address]          [place], [place] [address] [name] of [place] Phone: [phone] Relation: Daughter Secondary Emergency Contact: Rodriguez,Ray Address: [address]          [address]          [place], [place] [address] [place] [employer] Phone: [phone] Relation: Relative  Code Status:  DNR Goals of care: Advanced Directive information Advanced Directives 09/02/2017  Does Patient Have a Medical Advance Directive? Yes  Type of Advance Directive Out of facility DNR (pink MOST or yellow form)  Does patient want to make changes to medical advance directive? No - Patient declined  Copy of [place] in Chart? -  Would patient like information on creating a medical advance directive? -     Chief Complaint  Patient presents with  . Acute Visit    Recheck edema    HPI:  Pt is a 83 y.o. female seen today for an acute visit for cough, congestion, general malaise.  Patient also seen for routine recheck status post admission to facility for rehab following admission [facility] for fall with closed right hip fracture with surgical intervention.  Patient has been dissipating in PT/OT.  Patient reports her pain is well controlled on current regimen.  Patient reports appetite is fair, and has been decreased in the past day or 2.  She is on Ensure nutritional supplement.  Voiding without difficulty and having regular BMs.  Patient and staff reports new onset of cough, congestion and malaise.  Mild, low-grade fever.  Patient denies n/v/d/chills/cp/sob/ha/abd  pain/dizziness.  She does also report some pain and edema of the right lower leg.  Inner thigh with mild redness and warmth.  Tender to palpation.  Vital signs stable.  No other complaints.   Past Medical History:  Diagnosis Date  . Coronary artery disease 03/06/13   90% prox LAD stenosis s/p DES  . Difficult intubation   . Dizziness   . GERD (gastroesophageal reflux disease)   . Hypotension   . Ischemic cardiomyopathy 03/09/13   s/p NSTEMI. EF 30-35%.  . MI (myocardial infarction) ([place])   . PAF (paroxysmal atrial fibrillation) ([place]) 03/06/13   In setting of NSTEMI and reperfusion, no recurrence  . SVT (supraventricular tachycardia) ([facility])    Self-limited on outpatient cardiac monitor   Past Surgical History:  Procedure Laterality Date  . CESAREAN SECTION    . CORONARY ANGIOPLASTY WITH STENT PLACEMENT  02/2013   DES-mid LAD, mild LCx, RCA stenoses    Allergies  Allergen Reactions  . Penicillins     Has patient had a PCN reaction causing immediate rash, facial/tongue/throat swelling, SOB or lightheadedness with hypotension: Unknown Has patient had a PCN reaction causing severe rash involving mucus membranes or skin necrosis: Unknown Has patient had a PCN reaction that required hospitalization No Has patient had a PCN reaction occurring within the last 10 years: No If all of the above answers are "NO", then may proceed with Cephalosporin use.    Allergies as of 09/02/2017      Reactions   Penicillins    Has  patient had a PCN reaction causing immediate rash, facial/tongue/throat swelling, SOB or lightheadedness with hypotension: Unknown Has patient had a PCN reaction causing severe rash involving mucus membranes or skin necrosis: Unknown Has patient had a PCN reaction that required hospitalization No Has patient had a PCN reaction occurring within the last 10 years: No If all of the above answers are "NO", then may proceed with Cephalosporin use.      Medication List         Accurate as of 09/02/17 11:59 PM. Always use your most recent med list.          alum & mag hydroxide-simeth 200-200-20 MG/5ML suspension Commonly known as:  MAALOX/MYLANTA Take 30 mLs by mouth every 4 (four) hours as needed for indigestion.   atorvastatin 20 MG tablet Commonly known as:  LIPITOR TAKE 1 TABLET BY MOUTH AT BEDTIME   BREO ELLIPTA 100-25 MCG/INH Aepb Generic drug:  fluticasone furoate-vilanterol Inhale 1 puff into the lungs daily.   carvedilol 3.125 MG tablet Commonly known as:  COREG TAKE ONE TABLET TWICE A DAY WITH MEALS.   docusate sodium 100 MG capsule Commonly known as:  COLACE Take 1 capsule (100 mg total) by mouth 2 (two) times daily.   enoxaparin 30 MG/0.3ML injection Commonly known as:  LOVENOX Inject 0.3 mLs (30 mg total) into the skin daily. X 2 weeks only   ENSURE ENLIVE PO Take 1 Bottle by mouth 2 (two) times daily between meals.   feeding supplement (PRO-STAT SUGAR FREE 64) Liqd Take 30 mLs by mouth 2 (two) times daily between meals.   ferrous sulfate 325 (65 FE) MG tablet Commonly known as:  KP FERROUS SULFATE Take 1 tablet (325 mg total) by mouth 2 (two) times daily with a meal.   HYDROcodone-acetaminophen 5-325 MG tablet Commonly known as:  NORCO/VICODIN Take 1-2 tablets by mouth every 6 (six) hours as needed for moderate pain.   ipratropium-albuterol 0.5-2.5 (3) MG/3ML Soln Commonly known as:  DUONEB Take 3 mLs every 6 (six) hours as needed by nebulization.   loratadine 10 MG tablet Commonly known as:  CLARITIN Take 1 tablet (10 mg total) by mouth daily as needed for allergies.   mirtazapine 7.5 MG tablet Commonly known as:  REMERON Take 7.5 mg at bedtime by mouth.   nitroGLYCERIN 0.4 MG SL tablet Commonly known as:  NITROSTAT Place 0.4 mg under the tongue every 5 (five) minutes as needed for chest pain.   ranitidine 75 MG tablet Commonly known as:  ZANTAC Take 75 mg 2 (two) times daily by mouth.   vitamin C 1000 MG  tablet Take 1,000 mg by mouth daily.       Review of Systems  Constitutional: Negative for activity change, appetite change, chills, diaphoresis and fever.  HENT: Negative for congestion, sneezing, sore throat, trouble swallowing and voice change.   Respiratory: Positive for cough. Negative for apnea, choking, chest tightness, shortness of breath and wheezing.   Cardiovascular: Negative for chest pain, palpitations and leg swelling.  Gastrointestinal: Negative for abdominal distention, abdominal pain, constipation, diarrhea and nausea.  Genitourinary: Negative for difficulty urinating, dysuria, frequency and urgency.  Musculoskeletal: Positive for arthralgias (typical arthritis) and joint swelling. Negative for back pain, gait problem and myalgias.  Skin: Positive for wound. Negative for color change, pallor and rash.  Neurological: Negative for dizziness, tremors, syncope, speech difficulty, weakness, numbness and headaches.  Psychiatric/Behavioral: Negative for agitation and behavioral problems.  All other systems reviewed and are negative.   Immunization  History  Administered Date(s) Administered  . Influenza-Unspecified 07/30/2014, 07/17/2017  . Pneumococcal Conjugate-13 08/10/2014   Pertinent  Health Maintenance Due  Topic Date Due  . PNA vac Low Risk Adult (2 of 2 - PPSV23) 08/11/2015  . INFLUENZA VACCINE  Completed  . DEXA SCAN  Completed   Fall Risk  11/26/2016  Falls in the past year? No   Functional Status Survey:    Vitals:   09/02/17 1543  BP: (!) 145/59  Pulse: 70  Resp: 18  Temp: 98.5 F (36.9 C)  TempSrc: Oral  SpO2: 98%  Weight: 99 lb 11.2 oz (45.2 kg)  Height: 5' (1.524 m)   Body mass index is 19.47 kg/m. Physical Exam  Constitutional: She is oriented to person, place, and time. Vital signs are normal. She appears well-developed and well-nourished. She is active and cooperative. She does not appear ill. No distress.  HENT:  Head: Normocephalic and  atraumatic.  Mouth/Throat: Uvula is midline, oropharynx is clear and moist and mucous membranes are normal. Mucous membranes are not pale, not dry and not cyanotic.  Eyes: Conjunctivae, EOM and lids are normal. Pupils are equal, round, and reactive to light.  Neck: Trachea normal, normal range of motion and full passive range of motion without pain. Neck supple. No JVD present. No tracheal deviation, no edema and no erythema present. No thyromegaly present.  Cardiovascular: Normal rate, normal heart sounds, intact distal pulses and normal pulses. An irregular rhythm present. Exam reveals no gallop, no distant heart sounds and no friction rub.  No murmur heard. Pulses:      Dorsalis pedis pulses are 2+ on the right side, and 2+ on the left side.  1+ right lower extremity edema  Pulmonary/Chest: Effort normal. No accessory muscle usage. No respiratory distress. She has no wheezes. She has rhonchi in the left lower field. She has rales in the left lower field. She exhibits no tenderness.  Abdominal: Soft. Normal appearance and bowel sounds are normal. She exhibits no distension and no ascites. There is no tenderness.  Musculoskeletal: She exhibits no edema or tenderness.       Right hip: She exhibits decreased range of motion, decreased strength, swelling and laceration.  Expected osteoarthritis, stiffness; right inner thigh mild redness and warmth.  Tender to palpation  Neurological: She is alert and oriented to person, place, and time. She has normal strength.  Skin: Skin is warm and dry. Laceration noted. She is not diaphoretic. No cyanosis. No pallor. Nails show no clubbing.  Psychiatric: She has a normal mood and affect. Her speech is normal and behavior is normal. Judgment and thought content normal. Cognition and memory are impaired.  Nursing note and vitals reviewed.   Labs reviewed: Recent Labs    08/26/17 0450 08/30/17 1200 09/02/17 1122  NA 136 137 138  K 4.1 4.1 4.4  CL 110 108  106  CO2 21* 22 24  GLUCOSE 103* 103* 127*  BUN 25* 28* 30*  CREATININE 0.91 1.02* 0.89  CALCIUM 7.4* 8.3* 8.6*   Recent Labs    08/24/17 1200 09/02/17 1122  AST 34 36  ALT 22 24  ALKPHOS 71 111  BILITOT 2.4* 1.3*  PROT 5.1* 6.0*  ALBUMIN 2.4* 2.7*   Recent Labs    08/26/17 0450 08/30/17 1200 09/02/17 1122  WBC 17.3* 16.8* 11.7*  NEUTROABS 11.6* 13.9* 9.4*  HGB 9.3* 10.7* 11.6*  HCT 27.5* 33.0* 35.8  MCV 91.3 96.6 96.8  PLT 319 538* 554*   Lab  Results  Component Value Date   TSH 6.520 (H) 09/23/2015   Lab Results  Component Value Date   HGBA1C 5.9 09/23/2015   Lab Results  Component Value Date   CHOL 120 09/19/2015   HDL 59.60 09/19/2015   LDLCALC 48 09/19/2015   LDLDIRECT 60.0 08/23/2016   TRIG 60.0 09/19/2015   CHOLHDL 2 09/19/2015    Significant Diagnostic Results in last 30 days:  Ct Head Wo Contrast  Result Date: 08/17/2017 CLINICAL DATA:  Patient does not pick up her feet when she walks. Tripped over the rock. Right hip pain. Right hip fracture. EXAM: CT HEAD WITHOUT CONTRAST TECHNIQUE: Contiguous axial images were obtained from the base of the skull through the vertex without intravenous contrast. COMPARISON:  12/05/2011 FINDINGS: Brain: Diffuse cerebral atrophy. Mild ventricular dilatation consistent with central atrophy. Low-attenuation changes in the deep white matter consistent with small vessel ischemia. Basal ganglia calcifications. No mass effect or midline shift. No abnormal extra-axial fluid collections. Gray-white matter junctions are distinct. Basal cisterns are not effaced. No acute intracranial hemorrhage. Vascular: Vascular calcifications are present in the internal carotid and vertebrobasilar vessels. Skull: Calvarium appears intact. No acute depressed skull fractures. Focal lucency in the left anterior frontal region is unchanged since previous study and probably represents a hemangioma. Sinuses/Orbits: Acute appearing nasal bone fractures.  Paranasal sinuses are clear. Mastoid air cells are clear. Other: None. IMPRESSION: 1. No acute intracranial abnormalities. 2. Chronic atrophy and small vessel ischemic changes. 3. Nasal bone fractures with mild depression. 4. Vascular calcifications. Electronically Signed   By: Lucienne Capers M.D.   On: 08/17/2017 21:21   Dg Chest Portable 1 View  Result Date: 08/17/2017 CLINICAL DATA:  Preoperative evaluation, known right femoral fracture EXAM: PORTABLE CHEST 1 VIEW COMPARISON:  09/25/2015 FINDINGS: Cardiac shadow is within normal limits. Mild aortic calcifications are seen. Hyperinflation of the lungs is noted. No acute infiltrate or sizable effusion is seen. No bony abnormality is noted. IMPRESSION: COPD without acute abnormality. Electronically Signed   By: Inez Catalina M.D.   On: 08/17/2017 19:27   Dg Hip Operative Unilat W Or W/o Pelvis Right  Result Date: 08/19/2017 CLINICAL DATA:  Intertrochanteric fracture of the proximal right femur. EXAM: OPERATIVE RIGHT HIP (WITH PELVIS IF PERFORMED) 2 VIEWS TECHNIQUE: Fluoroscopic spot image(s) were submitted for interpretation post-operatively. COMPARISON:  RADIOGRAPHS DATED 08/17/2017 FINDINGS: AP and lateral C-arm images demonstrate the patient has undergone open reduction and internal fixation of the intertrochanteric fracture. Intramedullary nail and lag screw in place, appear in good position. IMPRESSION: Open reduction and internal fixation of intertrochanteric fracture of the proximal right femur. Electronically Signed   By: Lorriane Shire M.D.   On: 08/19/2017 15:33   Dg Hip Unilat  With Pelvis 2-3 Views Right  Result Date: 08/17/2017 CLINICAL DATA:  Recent fall with right leg pain, initial encounter EXAM: DG HIP (WITH OR WITHOUT PELVIS) 2-3V RIGHT COMPARISON:  None. FINDINGS: Pelvic ring is intact. There is an intratrochanteric right femoral fracture with some impaction and angulation at the fracture site. No gross soft tissue abnormality is  seen. No dislocation is noted. IMPRESSION: Right intratrochanteric femoral fracture. Electronically Signed   By: Inez Catalina M.D.   On: 08/17/2017 19:25    Assessment/Plan 1.  Closed fracture of right hip with routine healing, subsequent encounter  Continue PT/OT  Continue exercises taught by PT/OT  Continue Lovenox 30 mg subcu daily for DVT prophylaxis  Continue Norco 5/325 mg 1-2 tablets p.o. every 6 hours as  needed pain  Ice pack to the hip as needed for pain or edema  Skin/incision care per protocol  Follow-up with orthopedist as instructed  Right lower extremity duplex Doppler for pain and edema.  Rule out DVT  TED hose to bilateral lower extremities, only in the early a.m., off in the p.m.  Elevate legs when at rest  2.  Pneumonia of left lower lobe due to infectious organism  2 view chest x-ray  KUB  CBC, met C  Azithromycin 250 mg-give 2 tablets today, then 1 tablet daily times 4 days for pneumonia  Lasix 40 mg IM x1 now  Duo nebs every 6 hours as needed dyspnea  Family/ staff Communication:   Total Time:  Documentation:  Face to Face:  Family/Phone:   Labs/tests ordered: 2 view chest x-ray, KUB, CBC, met C  Medication list reviewed and assessed for continued appropriateness.  Vikki Ports, NP-C Geriatrics [facility] [phone] [address], [place] [address] Cell Phone (Mon-Fri 8am-5pm):  [phone] On Call:  [phone] & follow prompts after 5pm & weekends Office Phone:  [phone] Office Fax:  [phone]

## 2017-09-04 ENCOUNTER — Inpatient Hospital Stay: Payer: Medicare Other | Admitting: Family Medicine

## 2017-09-06 DIAGNOSIS — S72142A Displaced intertrochanteric fracture of left femur, initial encounter for closed fracture: Secondary | ICD-10-CM | POA: Diagnosis not present

## 2017-09-09 ENCOUNTER — Encounter: Payer: Self-pay | Admitting: Gerontology

## 2017-09-09 ENCOUNTER — Non-Acute Institutional Stay (SKILLED_NURSING_FACILITY): Payer: Medicare Other | Admitting: Gerontology

## 2017-09-09 ENCOUNTER — Telehealth: Payer: Self-pay | Admitting: Cardiovascular Disease

## 2017-09-09 DIAGNOSIS — J181 Lobar pneumonia, unspecified organism: Secondary | ICD-10-CM

## 2017-09-09 DIAGNOSIS — S72001D Fracture of unspecified part of neck of right femur, subsequent encounter for closed fracture with routine healing: Secondary | ICD-10-CM

## 2017-09-09 DIAGNOSIS — J189 Pneumonia, unspecified organism: Secondary | ICD-10-CM

## 2017-09-09 NOTE — Telephone Encounter (Signed)
Pt carehome is calling, states pt daughter would like to take Eliquis 2.5 for DVT. States pt Orthopaedic dr told her to take this medication. Please call and advise

## 2017-09-09 NOTE — Progress Notes (Signed)
Location:   [facility] Room Number: 379K Place of Service:  SNF [phone]) Provider:  Toni Arthurs, NP-C  Leone Haven, MD  Patient Care Team: Leone Haven, MD as PCP - General (Family Medicine) Minna Merritts, MD as Consulting Physician (Cardiology)  Extended Emergency Contact Information Primary Emergency Contact: Rodriguez,Kathy Address: [address]          [place], [place] [address] [name] of [place] Phone: [phone] Relation: Daughter Secondary Emergency Contact: Rodriguez,Ray Address: [address]          [place], [place] [address] [place] of [place] Phone: [phone] Relation: Relative  Code Status:  DNR Goals of care: Advanced Directive information Advanced Directives 09/09/2017  Does Patient Have a Medical Advance Directive? Yes  Type of Advance Directive Out of facility DNR (pink MOST or yellow form)  Does patient want to make changes to medical advance directive? No - Patient declined  Copy of [place] in Chart? -  Would patient like information on creating a medical advance directive? -     Chief Complaint  Patient presents with  . Medical Management of Chronic Issues    Routine Visit    HPI:  Pt is a 83 y.o. female seen today for medical management of chronic diseases.  Patient is admitted to the facility for rehab status post admission to from [facility] for fall with closed right hip fracture with surgical intervention.  Patient has been participating in PT/OT patient reports her pain is well controlled on current regimen.  Patient reports appetite is fair, but improving she is on Ensure nutritional supplement.  Voiding without difficulty and having regular BMs.  On last assessment, patient was found to have left lower lobe pneumonia and right lower extremity edema.  She was treated with Lasix, Z-Pak, and duo nebs.  Doppler of right lower extremity was  negative for DVT.  Today, patient reports she is not having any difficulty breathing, no cough or congestion.  She denies pain to the right lower extremity.  Vital signs stable.  No other complaints.   Past Medical History:  Diagnosis Date  . Coronary artery disease 03/06/13   90% prox LAD stenosis s/p DES  . Difficult intubation   . Dizziness   . GERD (gastroesophageal reflux disease)   . Hypotension   . Ischemic cardiomyopathy 03/09/13   s/p NSTEMI. EF 30-35%.  . MI (myocardial infarction) ([place] [facility])   . PAF (paroxysmal atrial fibrillation) ([place]) 03/06/13   In setting of NSTEMI and reperfusion, no recurrence  . SVT (supraventricular tachycardia) ([facility])    Self-limited on outpatient cardiac monitor   Past Surgical History:  Procedure Laterality Date  . CESAREAN SECTION    . CORONARY ANGIOPLASTY WITH STENT PLACEMENT  02/2013   DES-mid LAD, mild LCx, RCA stenoses    Allergies  Allergen Reactions  . Penicillins     Has patient had a PCN reaction causing immediate rash, facial/tongue/throat swelling, SOB or lightheadedness with hypotension: Unknown Has patient had a PCN reaction causing severe rash involving mucus membranes or skin necrosis: Unknown Has patient had a PCN reaction that required hospitalization No Has patient had a PCN reaction occurring within the last 10 years: No If all of the above answers are "NO", then may proceed with Cephalosporin use.    Allergies as of 09/09/2017      Reactions   Penicillins  Has patient had a PCN reaction causing immediate rash, facial/tongue/throat swelling, SOB or lightheadedness with hypotension: Unknown Has patient had a PCN reaction causing severe rash involving mucus membranes or skin necrosis: Unknown Has patient had a PCN reaction that required hospitalization No Has patient had a PCN reaction occurring within the last 10 years: No If all of the above answers are "NO", then may proceed with Cephalosporin use.      Medication List          Accurate as of 09/09/17  3:41 PM. Always use your most recent med list.          alum & mag hydroxide-simeth 200-200-20 MG/5ML suspension Commonly known as:  MAALOX/MYLANTA Take 30 mLs by mouth every 4 (four) hours as needed for indigestion.   atorvastatin 20 MG tablet Commonly known as:  LIPITOR TAKE 1 TABLET BY MOUTH AT BEDTIME   BREO ELLIPTA 100-25 MCG/INH Aepb Generic drug:  fluticasone furoate-vilanterol Inhale 1 puff into the lungs daily.   carvedilol 3.125 MG tablet Commonly known as:  COREG TAKE ONE TABLET TWICE A DAY WITH MEALS.   docusate sodium 100 MG capsule Commonly known as:  COLACE Take 1 capsule (100 mg total) by mouth 2 (two) times daily.   ELIQUIS 2.5 MG Tabs tablet Generic drug:  apixaban Take 2.5 mg 2 (two) times daily by mouth.   ENSURE ENLIVE PO Take 1 Bottle by mouth 2 (two) times daily between meals.   feeding supplement (PRO-STAT SUGAR FREE 64) Liqd Take 30 mLs by mouth 2 (two) times daily between meals.   ferrous sulfate 325 (65 FE) MG tablet Commonly known as:  KP FERROUS SULFATE Take 1 tablet (325 mg total) by mouth 2 (two) times daily with a meal.   HYDROcodone-acetaminophen 5-325 MG tablet Commonly known as:  NORCO/VICODIN Take 1-2 tablets by mouth every 6 (six) hours as needed for moderate pain.   ipratropium-albuterol 0.5-2.5 (3) MG/3ML Soln Commonly known as:  DUONEB Take 3 mLs every 6 (six) hours as needed by nebulization.   loratadine 10 MG tablet Commonly known as:  CLARITIN Take 1 tablet (10 mg total) by mouth daily as needed for allergies.   mirtazapine 7.5 MG tablet Commonly known as:  REMERON Take 7.5 mg at bedtime by mouth.   nitroGLYCERIN 0.4 MG SL tablet Commonly known as:  NITROSTAT Place 0.4 mg under the tongue every 5 (five) minutes as needed for chest pain.   ranitidine 75 MG tablet Commonly known as:  ZANTAC Take 75 mg 2 (two) times daily by mouth.   vitamin C 1000 MG tablet Take 1,000 mg by mouth  daily.       Review of Systems  Constitutional: Negative for activity change, appetite change, chills, diaphoresis and fever.  HENT: Negative for congestion, sneezing, sore throat, trouble swallowing and voice change.   Respiratory: Negative for apnea, cough, choking, chest tightness, shortness of breath and wheezing.   Cardiovascular: Negative for chest pain, palpitations and leg swelling.  Gastrointestinal: Negative for abdominal distention, abdominal pain, constipation, diarrhea and nausea.  Genitourinary: Negative for difficulty urinating, dysuria, frequency and urgency.  Musculoskeletal: Positive for arthralgias (typical arthritis). Negative for back pain, gait problem and myalgias.  Skin: Positive for wound. Negative for color change, pallor and rash.  Neurological: Negative for dizziness, tremors, syncope, speech difficulty, weakness, numbness and headaches.  Psychiatric/Behavioral: Negative for agitation and behavioral problems.  All other systems reviewed and are negative.   Immunization History  Administered Date(s) Administered  .  Influenza-Unspecified 07/30/2014, 07/17/2017  . Pneumococcal Conjugate-13 08/10/2014   Pertinent  Health Maintenance Due  Topic Date Due  . PNA vac Low Risk Adult (2 of 2 - PPSV23) 08/11/2015  . INFLUENZA VACCINE  Completed  . DEXA SCAN  Completed   Fall Risk  11/26/2016  Falls in the past year? No   Functional Status Survey:    Vitals:   09/09/17 1533  BP: (!) 152/66  Pulse: 89  Resp: 18  Temp: 98.1 F (36.7 C)  TempSrc: Oral  SpO2: 96%  Weight: 99 lb 11.2 oz (45.2 kg)  Height: 5\' 3"  (1.6 m)   Body mass index is 17.66 kg/m. Physical Exam  Constitutional: She is oriented to person, place, and time. Vital signs are normal. She appears well-developed and well-nourished. She is active and cooperative. She does not appear ill. No distress.  HENT:  Head: Normocephalic and atraumatic.  Mouth/Throat: Uvula is midline, oropharynx is  clear and moist and mucous membranes are normal. Mucous membranes are not pale, not dry and not cyanotic.  Eyes: Conjunctivae, EOM and lids are normal. Pupils are equal, round, and reactive to light.  Neck: Trachea normal, normal range of motion and full passive range of motion without pain. Neck supple. No JVD present. No tracheal deviation, no edema and no erythema present. No thyromegaly present.  Cardiovascular: Normal rate, normal heart sounds, intact distal pulses and normal pulses. An irregular rhythm present. Exam reveals no gallop, no distant heart sounds and no friction rub.  No murmur heard. Pulses:      Dorsalis pedis pulses are 2+ on the right side, and 2+ on the left side.  No edema  Pulmonary/Chest: Effort normal and breath sounds normal. No accessory muscle usage. No respiratory distress. She has no decreased breath sounds. She has no wheezes. She has no rhonchi. She has no rales. She exhibits no tenderness.  Abdominal: Soft. Normal appearance and bowel sounds are normal. She exhibits no distension and no ascites. There is no tenderness.  Musculoskeletal: She exhibits no edema or tenderness.       Right hip: She exhibits decreased range of motion, decreased strength and laceration.  Expected osteoarthritis, stiffness; calves soft, supple.  Negative Homans sign  Neurological: She is alert and oriented to person, place, and time. She has normal strength.  Skin: Skin is warm and dry. Laceration noted. She is not diaphoretic. No cyanosis. No pallor. Nails show no clubbing.  Psychiatric: She has a normal mood and affect. Her speech is normal and behavior is normal. Judgment and thought content normal. Cognition and memory are normal.  Nursing note and vitals reviewed.   Labs reviewed: Recent Labs    08/26/17 0450 08/30/17 1200 09/02/17 1122  NA 136 137 138  K 4.1 4.1 4.4  CL 110 108 106  CO2 21* 22 24  GLUCOSE 103* 103* 127*  BUN 25* 28* 30*  CREATININE 0.91 1.02* 0.89    CALCIUM 7.4* 8.3* 8.6*   Recent Labs    08/24/17 1200 09/02/17 1122  AST 34 36  ALT 22 24  ALKPHOS 71 111  BILITOT 2.4* 1.3*  PROT 5.1* 6.0*  ALBUMIN 2.4* 2.7*   Recent Labs    08/26/17 0450 08/30/17 1200 09/02/17 1122  WBC 17.3* 16.8* 11.7*  NEUTROABS 11.6* 13.9* 9.4*  HGB 9.3* 10.7* 11.6*  HCT 27.5* 33.0* 35.8  MCV 91.3 96.6 96.8  PLT 319 538* 554*   Lab Results  Component Value Date   TSH 6.520 (H) 09/23/2015  Lab Results  Component Value Date   HGBA1C 5.9 09/23/2015   Lab Results  Component Value Date   CHOL 120 09/19/2015   HDL 59.60 09/19/2015   LDLCALC 48 09/19/2015   LDLDIRECT 60.0 08/23/2016   TRIG 60.0 09/19/2015   CHOLHDL 2 09/19/2015    Significant Diagnostic Results in last 30 days:  Ct Head Wo Contrast  Result Date: 08/17/2017 CLINICAL DATA:  Patient does not pick up her feet when she walks. Tripped over the rock. Right hip pain. Right hip fracture. EXAM: CT HEAD WITHOUT CONTRAST TECHNIQUE: Contiguous axial images were obtained from the base of the skull through the vertex without intravenous contrast. COMPARISON:  12/05/2011 FINDINGS: Brain: Diffuse cerebral atrophy. Mild ventricular dilatation consistent with central atrophy. Low-attenuation changes in the deep white matter consistent with small vessel ischemia. Basal ganglia calcifications. No mass effect or midline shift. No abnormal extra-axial fluid collections. Gray-white matter junctions are distinct. Basal cisterns are not effaced. No acute intracranial hemorrhage. Vascular: Vascular calcifications are present in the internal carotid and vertebrobasilar vessels. Skull: Calvarium appears intact. No acute depressed skull fractures. Focal lucency in the left anterior frontal region is unchanged since previous study and probably represents a hemangioma. Sinuses/Orbits: Acute appearing nasal bone fractures. Paranasal sinuses are clear. Mastoid air cells are clear. Other: None. IMPRESSION: 1. No  acute intracranial abnormalities. 2. Chronic atrophy and small vessel ischemic changes. 3. Nasal bone fractures with mild depression. 4. Vascular calcifications. Electronically Signed   By: Lucienne Capers M.D.   On: 08/17/2017 21:21   Dg Chest Portable 1 View  Result Date: 08/17/2017 CLINICAL DATA:  Preoperative evaluation, known right femoral fracture EXAM: PORTABLE CHEST 1 VIEW COMPARISON:  09/25/2015 FINDINGS: Cardiac shadow is within normal limits. Mild aortic calcifications are seen. Hyperinflation of the lungs is noted. No acute infiltrate or sizable effusion is seen. No bony abnormality is noted. IMPRESSION: COPD without acute abnormality. Electronically Signed   By: Inez Catalina M.D.   On: 08/17/2017 19:27   Dg Hip Operative Unilat W Or W/o Pelvis Right  Result Date: 08/19/2017 CLINICAL DATA:  Intertrochanteric fracture of the proximal right femur. EXAM: OPERATIVE RIGHT HIP (WITH PELVIS IF PERFORMED) 2 VIEWS TECHNIQUE: Fluoroscopic spot image(s) were submitted for interpretation post-operatively. COMPARISON:  RADIOGRAPHS DATED 08/17/2017 FINDINGS: AP and lateral C-arm images demonstrate the patient has undergone open reduction and internal fixation of the intertrochanteric fracture. Intramedullary nail and lag screw in place, appear in good position. IMPRESSION: Open reduction and internal fixation of intertrochanteric fracture of the proximal right femur. Electronically Signed   By: Lorriane Shire M.D.   On: 08/19/2017 15:33   Dg Hip Unilat  With Pelvis 2-3 Views Right  Result Date: 08/17/2017 CLINICAL DATA:  Recent fall with right leg pain, initial encounter EXAM: DG HIP (WITH OR WITHOUT PELVIS) 2-3V RIGHT COMPARISON:  None. FINDINGS: Pelvic ring is intact. There is an intratrochanteric right femoral fracture with some impaction and angulation at the fracture site. No gross soft tissue abnormality is seen. No dislocation is noted. IMPRESSION: Right intratrochanteric femoral fracture.  Electronically Signed   By: Inez Catalina M.D.   On: 08/17/2017 19:25    Assessment/Plan 1.  Closed fracture of right hip with routine healing, subsequent encounter  Continue PT/OT  Continue exercises taught by PT/OT  Continue Lovenox 30 mg subcu daily for DVT prophylaxis  Continue Norco 5/325 mg 1-2 tablets p.o. every 6 hours as needed pain  Ice pack to hip as needed for pain or edema  Skin/incision care per protocol  Follow-up with orthopedist as instructed  TED hose to bilateral lower extremities, on in the early a.m., off in the p.m.  Elevate legs when at rest  2.  Pneumonia of left lower lobe due to infectious organism  Resolved   Family/ staff Communication:   Total Time:  Documentation:  Face to Face:  Family/Phone:   Labs/tests ordered:    Medication list reviewed and assessed for continued appropriateness. Monthly medication orders reviewed and signed.  Vikki Ports, NP-C Geriatrics [facility] [phone], [place] [address] Cell Phone (Mon-Fri 8am-5pm):  [phone] On Call:  [phone] & follow prompts after 5pm & weekends Office Phone:  [phone] Office Fax:  [phone]

## 2017-09-10 ENCOUNTER — Encounter: Payer: Self-pay | Admitting: Cardiovascular Disease

## 2017-09-10 NOTE — Telephone Encounter (Signed)
Spoke with [place] at [facility] and she states that the patients daughter wanted to make sure it was ok for her to start the Eliquis for DVT. Advised that would be fine and to please call if they should have any further questions or concerns. She was appreciative for the call back and had no further questions at this time.

## 2017-09-10 NOTE — Telephone Encounter (Signed)
This encounter was created in error - please disregard.

## 2017-09-10 NOTE — Telephone Encounter (Signed)
Daughter states pt has a lot of medications that has been added since pt last was seen by Dr. Rockey Situ.

## 2017-09-10 NOTE — Telephone Encounter (Signed)
Pt daughter is calling, states pt broke her hip 2 weeks a go, had surgery, orthopaedic provider suggested pt be on a blood thinner. Please call to discuss.

## 2017-09-10 NOTE — Telephone Encounter (Signed)
Spoke with patients daughter and confirmed that I spoke with [facility] and that starting the Eliquis would be fine. She was appreciative for the call back and had no further questions at this time.

## 2017-09-18 ENCOUNTER — Non-Acute Institutional Stay (SKILLED_NURSING_FACILITY): Payer: Medicare Other | Admitting: Gerontology

## 2017-09-18 ENCOUNTER — Encounter: Payer: Self-pay | Admitting: Gerontology

## 2017-09-18 DIAGNOSIS — S72001D Fracture of unspecified part of neck of right femur, subsequent encounter for closed fracture with routine healing: Secondary | ICD-10-CM

## 2017-09-18 NOTE — Progress Notes (Signed)
Location:   [facility] Room Number: 619J Place of Service:  SNF [phone]) Provider:  Toni Arthurs, NP-C  Leone Haven, MD  Patient Care Team: Leone Haven, MD as PCP - General (Family Medicine) Minna Merritts, MD as Consulting Physician (Cardiology)  Extended Emergency Contact Information Primary Emergency Contact: Rodriguez,Kathy Address: [address]          [place], [place] [address] [name] of [place] Phone: [phone] Relation: Daughter Secondary Emergency Contact: Rodriguez,Ray Address: [address]          [address]          [place], [place] [address] [place] Phone: [phone] Relation: Relative  Code Status:  DNR Goals of care: Advanced Directive information Advanced Directives 09/09/2017  Does Patient Have a Medical Advance Directive? Yes  Type of Advance Directive Out of facility DNR (pink MOST or yellow form)  Does patient want to make changes to medical advance directive? No - Patient declined  Copy of [place] in Chart? -  Would patient like information on creating a medical advance directive? -     Chief Complaint  Patient presents with  . Medical Management of Chronic Issues    Routine Visit    HPI:  Pt is a 83 y.o. female seen today for medical management of chronic diseases.  Patient was admitted to the facility for rehab following admission to [facility] for [facility] with closed right hip fracture with surgical intervention.  Patient has been participating in PT/OT.  Patient reports her pain is well controlled on current regimen.  Patient reports appetite is fair.  She is on Ensure nutritional supplement.  Voiding without difficulty and having regular BMs.  No further issues with cough or congestion.  TED hose in place.  No further edema of bilateral lower extremities.  Vital signs stable.  No other complaints.  Patient will likely transition to assisted living in the  near future.   Past Medical History:  Diagnosis Date  . Coronary artery disease 03/06/13   90% prox LAD stenosis s/p DES  . Difficult intubation   . Dizziness   . GERD (gastroesophageal reflux disease)   . Hypotension   . Ischemic cardiomyopathy 03/09/13   s/p NSTEMI. EF 30-35%.  . MI (myocardial infarction) ([place])   . PAF (paroxysmal atrial fibrillation) ([place]) 03/06/13   In setting of NSTEMI and reperfusion, no recurrence  . SVT (supraventricular tachycardia) (HCC)    Self-limited on outpatient cardiac monitor   Past Surgical History:  Procedure Laterality Date  . CESAREAN SECTION    . CORONARY ANGIOPLASTY WITH STENT PLACEMENT  02/2013   DES-mid LAD, mild LCx, RCA stenoses  . INTRAMEDULLARY (IM) NAIL INTERTROCHANTERIC Right 08/19/2017   Procedure: INTRAMEDULLARY (IM) NAIL INTERTROCHANTRIC;  Surgeon: Hessie Knows, MD;  Location: [facility] ORS;  Service: Orthopedics;  Laterality: Right;    Allergies  Allergen Reactions  . Penicillins     Has patient had a PCN reaction causing immediate rash, facial/tongue/throat swelling, SOB or lightheadedness with hypotension: Unknown Has patient had a PCN reaction causing severe rash involving mucus membranes or skin necrosis: Unknown Has patient had a PCN reaction that required hospitalization No Has patient had a PCN reaction occurring within the last 10 years: No If all of the above answers are "NO", then may proceed with Cephalosporin use.    Allergies as of 09/18/2017      Reactions   Penicillins  Has patient had a PCN reaction causing immediate rash, facial/tongue/throat swelling, SOB or lightheadedness with hypotension: Unknown Has patient had a PCN reaction causing severe rash involving mucus membranes or skin necrosis: Unknown Has patient had a PCN reaction that required hospitalization No Has patient had a PCN reaction occurring within the last 10 years: No If all of the above answers are "NO", then may proceed with Cephalosporin use.        Medication List        Accurate as of 09/18/17  2:56 PM. Always use your most recent med list.          alum & mag hydroxide-simeth 200-200-20 MG/5ML suspension Commonly known as:  MAALOX/MYLANTA Take 30 mLs by mouth every 4 (four) hours as needed for indigestion.   atorvastatin 20 MG tablet Commonly known as:  LIPITOR TAKE 1 TABLET BY MOUTH AT BEDTIME   BREO ELLIPTA 100-25 MCG/INH Aepb Generic drug:  fluticasone furoate-vilanterol Inhale 1 puff into the lungs daily.   carvedilol 3.125 MG tablet Commonly known as:  COREG TAKE ONE TABLET TWICE A DAY WITH MEALS.   docusate sodium 100 MG capsule Commonly known as:  COLACE Take 1 capsule (100 mg total) by mouth 2 (two) times daily.   ELIQUIS 2.5 MG Tabs tablet Generic drug:  apixaban Take 2.5 mg 2 (two) times daily by mouth.   ENSURE ENLIVE PO Take 1 Bottle by mouth 2 (two) times daily between meals.   feeding supplement (PRO-STAT SUGAR FREE 64) Liqd Take 30 mLs by mouth 2 (two) times daily between meals.   ferrous sulfate 325 (65 FE) MG tablet Commonly known as:  KP FERROUS SULFATE Take 1 tablet (325 mg total) by mouth 2 (two) times daily with a meal.   HYDROcodone-acetaminophen 5-325 MG tablet Commonly known as:  NORCO/VICODIN Take 1-2 tablets by mouth every 6 (six) hours as needed for moderate pain.   ipratropium-albuterol 0.5-2.5 (3) MG/3ML Soln Commonly known as:  DUONEB Take 3 mLs every 6 (six) hours as needed by nebulization.   loratadine 10 MG tablet Commonly known as:  CLARITIN Take 1 tablet (10 mg total) by mouth daily as needed for allergies.   mirtazapine 7.5 MG tablet Commonly known as:  REMERON Take 7.5 mg at bedtime by mouth.   nitroGLYCERIN 0.4 MG SL tablet Commonly known as:  NITROSTAT Place 0.4 mg under the tongue every 5 (five) minutes as needed for chest pain.   ranitidine 75 MG tablet Commonly known as:  ZANTAC Take 75 mg 2 (two) times daily by mouth.   vitamin C 1000 MG  tablet Take 1,000 mg by mouth daily.       Review of Systems  Immunization History  Administered Date(s) Administered  . Influenza-Unspecified 07/30/2014, 07/17/2017  . Pneumococcal Conjugate-13 08/10/2014   Pertinent  Health Maintenance Due  Topic Date Due  . PNA vac Low Risk Adult (2 of 2 - PPSV23) 08/11/2015  . INFLUENZA VACCINE  Completed  . DEXA SCAN  Completed   Fall Risk  11/26/2016  Falls in the past year? No   Functional Status Survey:    Vitals:   09/18/17 1451  BP: (!) 156/67  Pulse: 91  Resp: 16  Temp: 97.6 F (36.4 C)  TempSrc: Oral  SpO2: 96%  Weight: 99 lb 11.2 oz (45.2 kg)  Height: 5\' 3"  (1.6 m)   Body mass index is 17.66 kg/m. Physical Exam  Labs reviewed: Recent Labs    08/26/17 0450 08/30/17 1200 09/02/17 1122  NA 136 137 138  K 4.1 4.1 4.4  CL 110 108 106  CO2 21* 22 24  GLUCOSE 103* 103* 127*  BUN 25* 28* 30*  CREATININE 0.91 1.02* 0.89  CALCIUM 7.4* 8.3* 8.6*   Recent Labs    08/24/17 1200 09/02/17 1122  AST 34 36  ALT 22 24  ALKPHOS 71 111  BILITOT 2.4* 1.3*  PROT 5.1* 6.0*  ALBUMIN 2.4* 2.7*   Recent Labs    08/26/17 0450 08/30/17 1200 09/02/17 1122  WBC 17.3* 16.8* 11.7*  NEUTROABS 11.6* 13.9* 9.4*  HGB 9.3* 10.7* 11.6*  HCT 27.5* 33.0* 35.8  MCV 91.3 96.6 96.8  PLT 319 538* 554*   Lab Results  Component Value Date   TSH 6.520 (H) 09/23/2015   Lab Results  Component Value Date   HGBA1C 5.9 09/23/2015   Lab Results  Component Value Date   CHOL 120 09/19/2015   HDL 59.60 09/19/2015   LDLCALC 48 09/19/2015   LDLDIRECT 60.0 08/23/2016   TRIG 60.0 09/19/2015   CHOLHDL 2 09/19/2015    Significant Diagnostic Results in last 30 days:  Dg Hip Operative Unilat W Or W/o Pelvis Right  Result Date: 08/19/2017 CLINICAL DATA:  Intertrochanteric fracture of the proximal right femur. EXAM: OPERATIVE RIGHT HIP (WITH PELVIS IF PERFORMED) 2 VIEWS TECHNIQUE: Fluoroscopic spot image(s) were submitted for  interpretation post-operatively. COMPARISON:  RADIOGRAPHS DATED 08/17/2017 FINDINGS: AP and lateral C-arm images demonstrate the patient has undergone open reduction and internal fixation of the intertrochanteric fracture. Intramedullary nail and lag screw in place, appear in good position. IMPRESSION: Open reduction and internal fixation of intertrochanteric fracture of the proximal right femur. Electronically Signed   By: Lorriane Shire M.D.   On: 08/19/2017 15:33    Assessment/Plan 1.  Closed fracture of right hip with routine healing, subsequent encounter  Continue PT/OT  Continue exercises as taught by PT/OT  Continue Norco 5/325 mg 1-2 tablets p.o. every 6 hours as needed pain  Ice pack to hip as needed for pain or edema  Skin/incision care per protocol  Follow-up with orthopedist as instructed  TED hose to bilateral lower extremities, only in the early a.m., off in the p.m.  Elevate legs when at rest  Family/ staff Communication:   Total Time:  Documentation:  Face to Face:  Family/Phone:   Labs/tests ordered:   Medication list reviewed and assessed for continued appropriateness. Monthly medication orders reviewed and signed.  Vikki Ports, NP-C Geriatrics [facility] [id number], [place] [address] Cell Phone (Mon-Fri 8am-5pm):  [phone] On Call:  [phone] & follow prompts after 5pm & weekends Office Phone:  [phone] Office Fax:  [phone]

## 2017-09-28 ENCOUNTER — Encounter
Admission: RE | Admit: 2017-09-28 | Discharge: 2017-09-28 | Disposition: A | Discharge status: Home / Self Care | Payer: Medicare Other | Source: Ambulatory Visit | Attending: Internal Medicine | Admitting: Internal Medicine

## 2017-09-30 DIAGNOSIS — Z8781 Personal history of (healed) traumatic fracture: Secondary | ICD-10-CM | POA: Diagnosis not present

## 2017-09-30 DIAGNOSIS — Z967 Presence of other bone and tendon implants: Secondary | ICD-10-CM | POA: Diagnosis not present

## 2017-10-09 DIAGNOSIS — M810 Age-related osteoporosis without current pathological fracture: Secondary | ICD-10-CM | POA: Diagnosis not present

## 2017-10-09 DIAGNOSIS — E441 Mild protein-calorie malnutrition: Secondary | ICD-10-CM | POA: Diagnosis not present

## 2017-10-09 DIAGNOSIS — I255 Ischemic cardiomyopathy: Secondary | ICD-10-CM | POA: Diagnosis not present

## 2017-10-09 DIAGNOSIS — I25119 Atherosclerotic heart disease of native coronary artery with unspecified angina pectoris: Secondary | ICD-10-CM | POA: Diagnosis not present

## 2017-10-29 ENCOUNTER — Encounter
Admission: RE | Admit: 2017-10-29 | Discharge: 2017-10-29 | Disposition: A | Discharge status: Home / Self Care | Payer: Medicare Other | Source: Ambulatory Visit | Attending: Internal Medicine | Admitting: Internal Medicine

## 2017-11-11 IMAGING — CR DG CHEST 1V PORT
1 series · 1 of 1 positions shown · non-contrast
Comparison: 03/11/2013.

CLINICAL DATA: Cough.  Shortness of breath.

EXAM:
PORTABLE CHEST 1 VIEW

[ap]
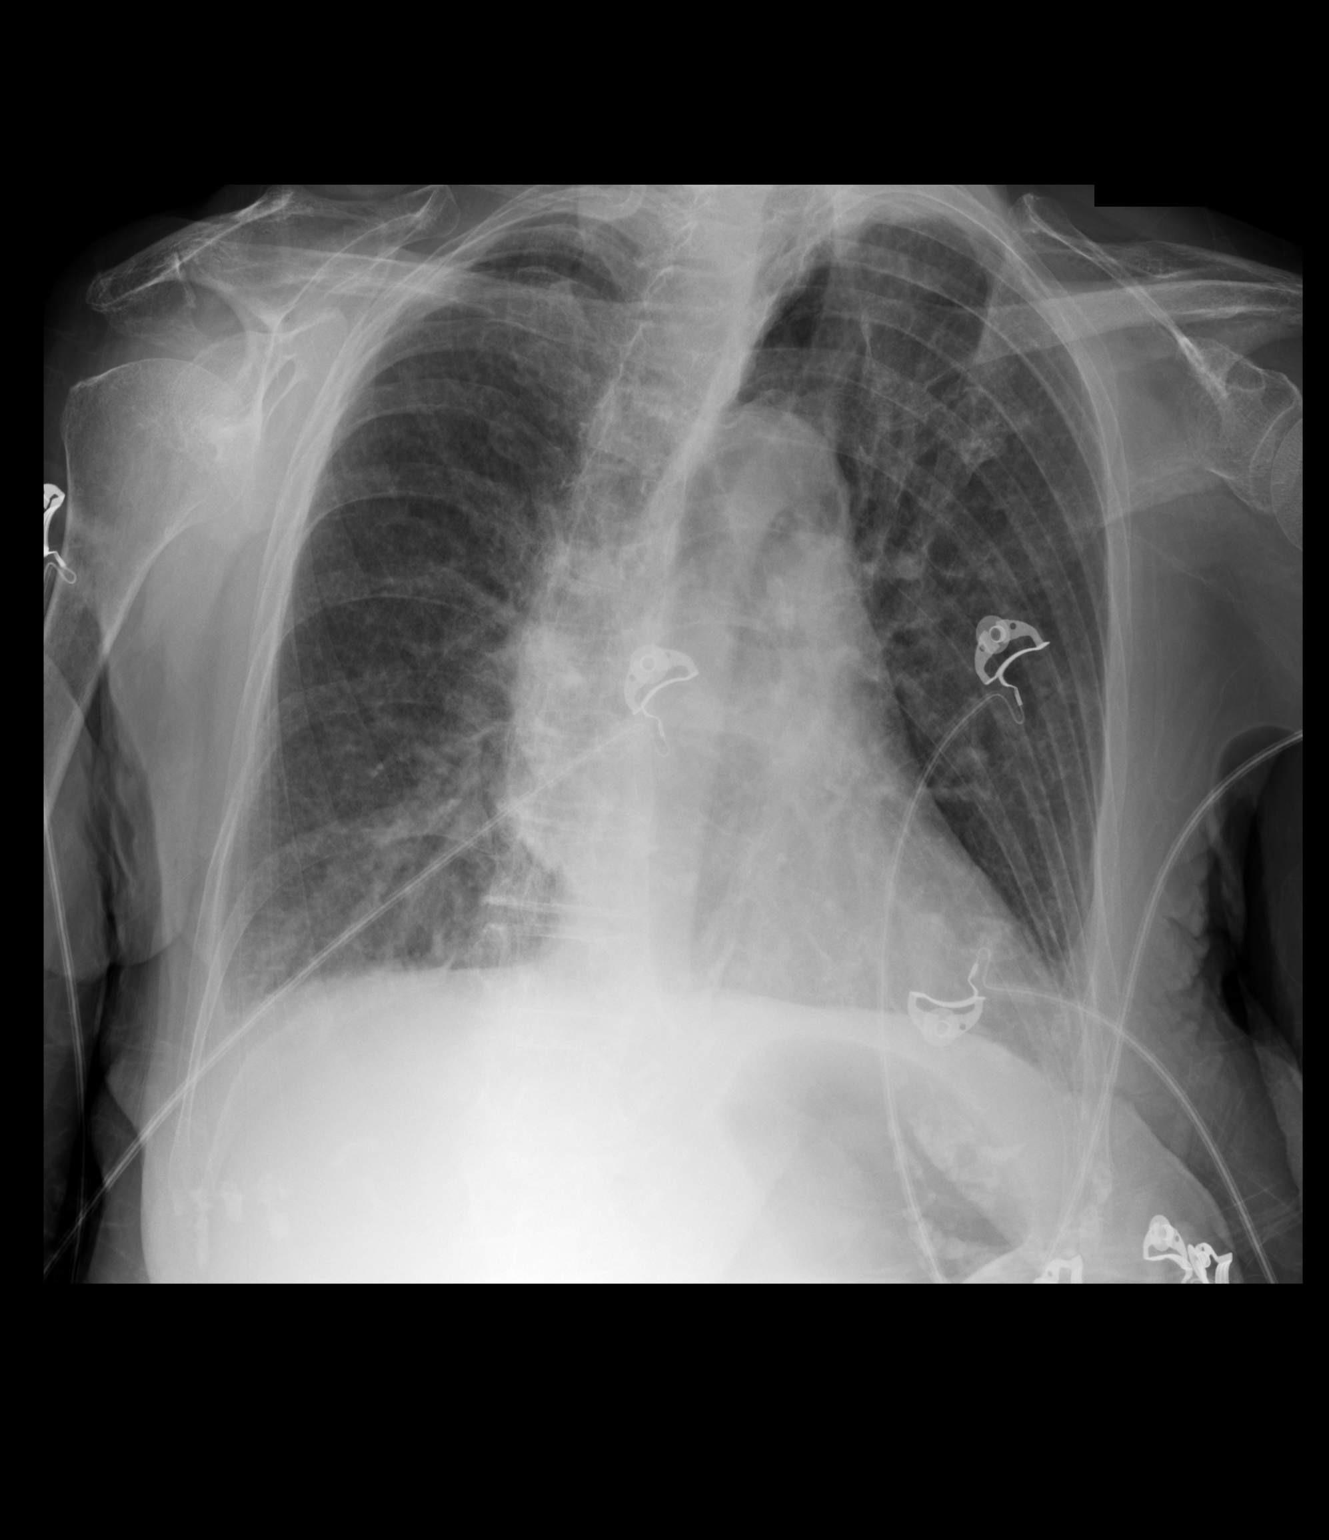

[1 of 1 positions shown; findings below may reference images not displayed]

FINDINGS: Mediastinum and hilar structures are normal. Cardiomegaly pulmonary
venous congestion bilateral pulmonary interstitial prominence with
Kerley B-lines. Small bilateral effusions. Findings suggest
congestive heart failure. No pneumothorax. Thoracic spine
degenerative changes scoliosis.
IMPRESSION: Findings consistent with congestive heart failure with mild
pulmonary interstitial edema and small bilateral effusions.

## 2017-11-14 ENCOUNTER — Encounter: Payer: Self-pay | Admitting: Gerontology

## 2017-11-14 ENCOUNTER — Non-Acute Institutional Stay: Payer: Medicare Other | Admitting: Gerontology

## 2017-11-14 DIAGNOSIS — I480 Paroxysmal atrial fibrillation: Secondary | ICD-10-CM | POA: Diagnosis not present

## 2017-11-14 DIAGNOSIS — I5043 Acute on chronic combined systolic (congestive) and diastolic (congestive) heart failure: Secondary | ICD-10-CM

## 2017-11-14 DIAGNOSIS — I471 Supraventricular tachycardia: Secondary | ICD-10-CM | POA: Diagnosis not present

## 2017-11-17 NOTE — Assessment & Plan Note (Signed)
Stable. Symptoms managed without medications at this time. Pt denies chest pain or shortness of breath

## 2017-11-17 NOTE — Progress Notes (Signed)
Location[facility] Room Number: 734L Place of Service:  ALF [phone]) Provider:  Toni Arthurs, NP-C  Leone Haven, MD  Patient Care Team: Leone Haven, MD as PCP - General (Family Medicine) Minna Merritts, MD as Consulting Physician (Cardiology)  Extended Emergency Contact Information Primary Emergency Contact: Rodriguez,Katherine Address: [address]          [place], [place] [address] [name] of [place] Phone: [phone] Work Phone: [phone] Mobile Phone: [phone] Relation: Daughter Secondary Emergency Contact: Rodriguez,Ray Address: [address]          [address]          [place], [place] [address] [place] Phone: [phone] Relation: Relative  Code Status:  FULL Goals of care: Advanced Directive information Advanced Directives 11/14/2017  Does Patient Have a Medical Advance Directive? Yes  Type of Advance Directive Out of facility DNR (pink MOST or yellow form)  Does patient want to make changes to medical advance directive? No - Patient declined  Copy of [place] in Chart? -  Would patient like information on creating a medical advance directive? -     Chief Complaint  Patient presents with  . Medical Management of Chronic Issues    Routine Visit    HPI:  Pt is a 84 y.o. female seen today for medical management of chronic diseases.    Systolic and diastolic CHF, acute on chronic (HCC) Stable. Symptoms managed without medications at this time. Pt denies chest pain or shortness of breath  PAF (paroxysmal atrial fibrillation) (HCC) Stable. No recent episodes of a-fib. No anticoagulation  SVT (supraventricular tachycardia) Stable. No episodes of SVT, no tachycardia. Managed  With Coreg  Please note pt with limited verbal ability. Unable to obtain complete ROS. Some ROS info obtained from staff and documentation.   Past Medical History:  Diagnosis Date  . Coronary artery  disease 03/06/13   90% prox LAD stenosis s/p DES  . Difficult intubation   . Dizziness   . GERD (gastroesophageal reflux disease)   . Hypotension   . Ischemic cardiomyopathy 03/09/13   s/p NSTEMI. EF 30-35%.  . MI (myocardial infarction) ([place])   . PAF (paroxysmal atrial fibrillation) ([place]) 03/06/13   In setting of NSTEMI and reperfusion, no recurrence  . SVT (supraventricular tachycardia) (HCC)    Self-limited on outpatient cardiac monitor   Past Surgical History:  Procedure Laterality Date  . CESAREAN SECTION    . CORONARY ANGIOPLASTY WITH STENT PLACEMENT  02/2013   DES-mid LAD, mild LCx, RCA stenoses  . INTRAMEDULLARY (IM) NAIL INTERTROCHANTERIC Right 08/19/2017   Procedure: INTRAMEDULLARY (IM) NAIL INTERTROCHANTRIC;  Surgeon: Hessie Knows, MD;  Location: [facility] ORS;  Service: Orthopedics;  Laterality: Right;    Allergies  Allergen Reactions  . Penicillins     Has patient had a PCN reaction causing immediate rash, facial/tongue/throat swelling, SOB or lightheadedness with hypotension: Unknown Has patient had a PCN reaction causing severe rash involving mucus membranes or skin necrosis: Unknown Has patient had a PCN reaction that required hospitalization No Has patient had a PCN reaction occurring within the last 10 years: No If all of the above answers are "NO", then may proceed with Cephalosporin use.    Allergies as of 11/14/2017      Reactions   Penicillins    Has patient had a PCN reaction causing immediate rash, facial/tongue/throat swelling, SOB or lightheadedness with hypotension: Unknown Has patient had a PCN reaction  causing severe rash involving mucus membranes or skin necrosis: Unknown Has patient had a PCN reaction that required hospitalization No Has patient had a PCN reaction occurring within the last 10 years: No If all of the above answers are "NO", then may proceed with Cephalosporin use.      Medication List        Accurate as of 11/14/17 11:59 PM. Always use  your most recent med list.          atorvastatin 20 MG tablet Commonly known as:  LIPITOR TAKE 1 TABLET BY MOUTH AT BEDTIME   BREO ELLIPTA 100-25 MCG/INH Aepb Generic drug:  fluticasone furoate-vilanterol Inhale 1 puff into the lungs daily.   carvedilol 3.125 MG tablet Commonly known as:  COREG TAKE ONE TABLET TWICE A DAY WITH MEALS.   Cholecalciferol 4000 units Caps Take 1 capsule by mouth every other day.   cyanocobalamin 1000 MCG/ML injection Commonly known as:  (VITAMIN B-12) Inject 1,000 mcg into the muscle every 30 (thirty) days. On 22nd of the month   docusate sodium 100 MG capsule Commonly known as:  COLACE Take 1 capsule (100 mg total) by mouth 2 (two) times daily.   ENSURE ENLIVE PO Take 1 Bottle by mouth 2 (two) times daily between meals.   feeding supplement (PRO-STAT SUGAR FREE 64) Liqd Take 30 mLs by mouth 2 (two) times daily between meals.   ferrous sulfate 325 (65 FE) MG tablet Take 325 mg by mouth every other day. With meals   ipratropium-albuterol 0.5-2.5 (3) MG/3ML Soln Commonly known as:  DUONEB Take 3 mLs every 6 (six) hours as needed by nebulization.   loratadine 10 MG tablet Commonly known as:  CLARITIN Take 1 tablet (10 mg total) by mouth daily as needed for allergies.   mirtazapine 7.5 MG tablet Commonly known as:  REMERON Take 7.5 mg at bedtime by mouth.   nitroGLYCERIN 0.4 MG SL tablet Commonly known as:  NITROSTAT Place 0.4 mg under the tongue every 5 (five) minutes as needed for chest pain.   ranitidine 75 MG tablet Commonly known as:  ZANTAC Take 75 mg 2 (two) times daily by mouth.   vitamin C 1000 MG tablet Take 1,000 mg by mouth daily.       Review of Systems  Unable to perform ROS: Dementia  Constitutional: Negative for activity change, appetite change, chills, diaphoresis and fever.  HENT: Negative for congestion, mouth sores, nosebleeds, postnasal drip, sneezing, sore throat, trouble swallowing and voice change.     Respiratory: Negative for apnea, cough, choking, chest tightness, shortness of breath and wheezing.   Cardiovascular: Negative for chest pain, palpitations and leg swelling.  Gastrointestinal: Negative for abdominal distention, abdominal pain, constipation, diarrhea and nausea.  Genitourinary: Negative for difficulty urinating, dysuria, frequency and urgency.  Musculoskeletal: Positive for arthralgias (typical arthritis). Negative for back pain, gait problem and myalgias.  Skin: Negative for color change, pallor, rash and wound.  Neurological: Negative for dizziness, tremors, syncope, speech difficulty, weakness, numbness and headaches.  Psychiatric/Behavioral: Positive for confusion. Negative for agitation and behavioral problems.  All other systems reviewed and are negative.   Immunization History  Administered Date(s) Administered  . Influenza-Unspecified 07/30/2014, 07/17/2017  . Pneumococcal Conjugate-13 08/10/2014   Pertinent  Health Maintenance Due  Topic Date Due  . PNA vac Low Risk Adult (2 of 2 - PPSV23) 08/11/2015  . INFLUENZA VACCINE  Completed  . DEXA SCAN  Completed   Fall Risk  11/26/2016  Falls in the past  year? No   Functional Status Survey:    Vitals:   11/14/17 1201  BP: 140/73  Pulse: 80  Resp: 16  Temp: 98.6 F (37 C)  TempSrc: Oral  SpO2: 98%  Weight: 89 lb 14.4 oz (40.8 kg)  Height: 5\' 3"  (1.6 m)   Body mass index is 15.93 kg/m. Physical Exam  Constitutional: She is oriented to person, place, and time. Vital signs are normal. She appears well-developed and well-nourished. She is active and cooperative. She does not appear ill. No distress.  HENT:  Head: Normocephalic and atraumatic.  Mouth/Throat: Uvula is midline, oropharynx is clear and moist and mucous membranes are normal. Mucous membranes are not pale, not dry and not cyanotic.  Eyes: Conjunctivae, EOM and lids are normal. Pupils are equal, round, and reactive to light.  Neck: Trachea  normal, normal range of motion and full passive range of motion without pain. Neck supple. No JVD present. No tracheal deviation, no edema and no erythema present. No thyromegaly present.  Cardiovascular: Normal rate, regular rhythm, normal heart sounds, intact distal pulses and normal pulses. Exam reveals no gallop, no distant heart sounds and no friction rub.  No murmur heard. Pulses:      Dorsalis pedis pulses are 2+ on the right side, and 2+ on the left side.  No edema  Pulmonary/Chest: Effort normal and breath sounds normal. No accessory muscle usage. No respiratory distress. She has no decreased breath sounds. She has no wheezes. She has no rhonchi. She has no rales. She exhibits no tenderness.  Abdominal: Soft. Normal appearance and bowel sounds are normal. She exhibits no distension and no ascites. There is no tenderness.  Musculoskeletal: Normal range of motion. She exhibits no edema or tenderness.  Expected osteoarthritis, stiffness; Bilateral Calves soft, supple. Negative Homan's Sign. B- pedal pulses equal; generalized weakness; ambulatory with walker  Neurological: She is alert and oriented to person, place, and time. She has normal strength. A cranial nerve deficit is present. Coordination and gait abnormal.  Skin: Skin is warm, dry and intact. She is not diaphoretic. No cyanosis. No pallor. Nails show no clubbing.  Psychiatric: She has a normal mood and affect. Her speech is normal and behavior is normal. Judgment and thought content normal. Cognition and memory are impaired. She exhibits abnormal recent memory.  Nursing note and vitals reviewed.   Labs reviewed: Recent Labs    08/26/17 0450 08/30/17 1200 09/02/17 1122  NA 136 137 138  K 4.1 4.1 4.4  CL 110 108 106  CO2 21* 22 24  GLUCOSE 103* 103* 127*  BUN 25* 28* 30*  CREATININE 0.91 1.02* 0.89  CALCIUM 7.4* 8.3* 8.6*   Recent Labs    08/24/17 1200 09/02/17 1122  AST 34 36  ALT 22 24  ALKPHOS 71 111  BILITOT  2.4* 1.3*  PROT 5.1* 6.0*  ALBUMIN 2.4* 2.7*   Recent Labs    08/26/17 0450 08/30/17 1200 09/02/17 1122  WBC 17.3* 16.8* 11.7*  NEUTROABS 11.6* 13.9* 9.4*  HGB 9.3* 10.7* 11.6*  HCT 27.5* 33.0* 35.8  MCV 91.3 96.6 96.8  PLT 319 538* 554*   Lab Results  Component Value Date   TSH 6.520 (H) 09/23/2015   Lab Results  Component Value Date   HGBA1C 5.9 09/23/2015   Lab Results  Component Value Date   CHOL 120 09/19/2015   HDL 59.60 09/19/2015   LDLCALC 48 09/19/2015   LDLDIRECT 60.0 08/23/2016   TRIG 60.0 09/19/2015   CHOLHDL 2  09/19/2015    Significant Diagnostic Results in last 30 days:  No results found.  Assessment/Plan Zyrah was seen today for medical management of chronic issues.  Diagnoses and all orders for this visit:  Systolic and diastolic CHF, acute on chronic (HCC)  SVT (supraventricular tachycardia) (HCC)  PAF (paroxysmal atrial fibrillation) ([place])   above listed conditions stable  Continue current medication regimen  Follow up with cardiology as needed  Monitor pt's weight  Monitor for edema  Family/ staff Communication:   Total Time:  Documentation:  Face to Face:  Family/Phone:   Labs/tests ordered:  Not due  Medication list reviewed and assessed for continued appropriateness. Monthly medication orders reviewed and signed.  Vikki Ports, NP-C Geriatrics [facility] [phone], [place] [address] Cell Phone (Mon-Fri 8am-5pm):  [phone] On Call:  [phone] & follow prompts after 5pm & weekends Office Phone:  [phone] Office Fax:  [phone]

## 2017-11-17 NOTE — Assessment & Plan Note (Signed)
Stable. No episodes of SVT, no tachycardia. Managed  With Coreg

## 2017-11-17 NOTE — Assessment & Plan Note (Signed)
Stable. No recent episodes of a-fib. No anticoagulation

## 2017-11-27 ENCOUNTER — Ambulatory Visit: Payer: Medicare Other

## 2017-11-29 ENCOUNTER — Encounter
Admission: RE | Admit: 2017-11-29 | Discharge: 2017-11-29 | Disposition: A | Discharge status: Home / Self Care | Payer: Medicare Other | Source: Ambulatory Visit | Attending: Internal Medicine | Admitting: Internal Medicine

## 2017-12-13 ENCOUNTER — Non-Acute Institutional Stay: Payer: Medicare Other | Admitting: Gerontology

## 2017-12-13 ENCOUNTER — Encounter: Payer: Self-pay | Admitting: Gerontology

## 2017-12-13 DIAGNOSIS — I2510 Atherosclerotic heart disease of native coronary artery without angina pectoris: Secondary | ICD-10-CM | POA: Diagnosis not present

## 2017-12-13 DIAGNOSIS — S72001D Fracture of unspecified part of neck of right femur, subsequent encounter for closed fracture with routine healing: Secondary | ICD-10-CM | POA: Diagnosis not present

## 2017-12-13 DIAGNOSIS — L932 Other local lupus erythematosus: Secondary | ICD-10-CM

## 2017-12-21 NOTE — Progress Notes (Signed)
Cardiology Office Note  Date:  12/23/2017   ID:  Deshunda Thackston, Alferd Apa 01-17-1925, MRN [id number]  PCP:  Leone Haven, MD   Chief Complaint  Patient presents with  . Other    6 month follow up. Patient c/o swelling in ankles. Meds reviewed verbally with patient.     HPI:  Linda Hart is a very pleasant 84 year old woman with PMHx s/f CAD (NSTEMI 03/06/13 s/p DES-LAD),  PAF (in setting of NSTEMI and reperfusion), not on anticoagulation given frequent falls ischemic cardiomyopathy (EF 30-35% on 03/09/13 echo),  SVT (self-limited on OP monitor),  GERD, remote h/o lightheadedness and syncope who was re-admitted to [facility] with worsening DOE/SOB may 2014.   She lives at the [facility] She presents today for follow-up of her coronary artery disease  Recent fall,  hip fx 07/2017 Required surgery Subsequent Anemia UTI, hypotension in the hospital treated with IV antibiotics then Keflex Slow recovery in rehab per the daughter Now living in assisted living [place] Reports that she is doing much better, ambulating with walker with no assistance Lost 10 pounds during October, trying to get this weight back  Had severe leg swelling  during hospitalization this has slowly recovered Wears compression hose Anemia resolved Aspirin and brilinta were held at discharge and not restarted  Denies any chest pain concerning for angina Denies any tachycardia concerning for atrial fibrillation  Prior history of falls Fall 2 x, kitchen in MAY 2018, And getting gout of the bath tub the same week  She denies any significant chest pain on exertion, no shortness of breath  [place]   Does not drink much at baseline  EKG on today's visit showing no sinus rhythm with rate 77 bpm, PVCs quadrigeminal pattern , no significant ST or T-wave changes  Other past medical history She was admitted 03/05/13 with NSTEMI.  cardiac  catheterization revealing tubular 90% prox extending to mid LAD stenosis s/p DES, mild atheresclerosis in LCx, RCA.  Post-cath echo showed EF 30-35%, severe anterior, septal and apical HK, mild TR/MR, mildly elevated EF described as reduced. DAPT- ASA/Brilinta x 12 months recommended.  Cr did bump to 1.5-1.6 suspected to be secondary to contrast induced precluding the addition of ACEi/ARB. Limited repeat echo indicated LVEF 30-35%. She declined LifeVest.   After discharge from the hospital, creatinine was >2, Lasix was held. Creatinine in June 2014 improved to 1.5.  Previous event monitor showed frequent supraventricular ectopy, rare PVCs.  She had short runs of SVT, the longest was 11 beats. She had 46 short runs. Uncertain if she was symptomatic during these short runs of SVT  Echocardiogram may 2014 with ejection fraction 30-35%, mild to moderate pulmonary hypertension  PMH:   has a past medical history of Coronary artery disease (03/06/13), Difficult intubation, Dizziness, GERD (gastroesophageal reflux disease), Hypotension, Ischemic cardiomyopathy (03/09/13), MI (myocardial infarction) ([place]), PAF (paroxysmal atrial fibrillation) ([place]) (03/06/13), and SVT (supraventricular tachycardia) ([place]).  PSH:    Past Surgical History:  Procedure Laterality Date  . CESAREAN SECTION    . CORONARY ANGIOPLASTY WITH STENT PLACEMENT  02/2013   DES-mid LAD, mild LCx, RCA stenoses  . INTRAMEDULLARY (IM) NAIL INTERTROCHANTERIC Right 08/19/2017   Procedure: INTRAMEDULLARY (IM) NAIL INTERTROCHANTRIC;  Surgeon: Hessie Knows, MD;  Location: [facility] ORS;  Service: Orthopedics;  Laterality: Right;    Current Outpatient Medications  Medication Sig Dispense Refill  . Amino Acids-Protein Hydrolys (FEEDING SUPPLEMENT, PRO-STAT SUGAR FREE  64,) LIQD Take 30 mLs by mouth 2 (two) times daily between meals.    . Ascorbic Acid (VITAMIN C) 1000 MG tablet Take 1,000 mg by mouth daily.    Marland Kitchen atorvastatin (LIPITOR) 20 MG  tablet TAKE 1 TABLET BY MOUTH AT BEDTIME 90 tablet 1  . carvedilol (COREG) 3.125 MG tablet TAKE ONE TABLET TWICE A DAY WITH MEALS. 60 tablet 11  . Cholecalciferol 4000 units CAPS Take 1 capsule by mouth every other day.    . cyanocobalamin (,VITAMIN B-12,) 1000 MCG/ML injection Inject 1,000 mcg into the muscle every 30 (thirty) days. On 22nd of the month    . docusate sodium (COLACE) 100 MG capsule Take 100 mg by mouth 2 (two) times daily as needed.    . ferrous sulfate 325 (65 FE) MG tablet Take 325 mg by mouth every other day. With meals    . fluticasone furoate-vilanterol (BREO ELLIPTA) 100-25 MCG/INH AEPB Inhale 1 puff into the lungs daily.     Marland Kitchen ipratropium-albuterol (DUONEB) 0.5-2.5 (3) MG/3ML SOLN Take 3 mLs every 6 (six) hours as needed by nebulization.     Marland Kitchen loratadine (CLARITIN) 10 MG tablet Take 1 tablet (10 mg total) by mouth daily as needed for allergies. 30 tablet 11  . mirtazapine (REMERON) 7.5 MG tablet Take 7.5 mg at bedtime by mouth.    . nitroGLYCERIN (NITROSTAT) 0.4 MG SL tablet Place 0.4 mg under the tongue every 5 (five) minutes as needed for chest pain.    . Nutritional Supplements (ENSURE ENLIVE PO) Take 1 Bottle by mouth 2 (two) times daily between meals.    . ranitidine (ZANTAC) 75 MG tablet Take 75 mg 2 (two) times daily by mouth.     Marland Kitchen aspirin EC 81 MG tablet Take 1 tablet (81 mg total) by mouth daily. 90 tablet 3   No current facility-administered medications for this visit.      Allergies:   Penicillins   Social History:  The patient  reports that  has never smoked. she has never used smokeless tobacco. She reports that she does not drink alcohol or use drugs.   Family History:   family history includes Heart attack in her father; Heart failure in her father and mother.    Review of Systems: Review of Systems  Constitutional: Negative.   Respiratory: Negative.   Cardiovascular: Negative.   Gastrointestinal: Negative.   Musculoskeletal: Positive for falls.        Leg weakness  Neurological: Negative.   Psychiatric/Behavioral: Negative.   All other systems reviewed and are negative.    PHYSICAL EXAM: VS:  BP (!) 148/60 (BP Location: Left Arm, Patient Position: Sitting, Cuff Size: Normal)   Pulse 77   Ht 5' (1.524 m)   Wt 93 lb (42.2 kg)   BMI 18.16 kg/m  , BMI Body mass index is 18.16 kg/m. Constitutional:  oriented to person, place, and time. No distress. Walks with a walker Thin HENT:  Head: Normocephalic and atraumatic.  Eyes:  no discharge. No scleral icterus.  Neck: Normal range of motion. Neck supple. No JVD present.  Cardiovascular: Normal rate, regular rhythm, ectopy noted , normal heart sounds and intact distal pulses. Exam reveals no gallop and no friction rub. No edema No murmur heard. Pulmonary/Chest: Effort normal and breath sounds normal. No stridor. No respiratory distress.  no wheezes.  no rales.  no tenderness.  Abdominal: Soft.  no distension.  no tenderness.  Musculoskeletal: Normal range of motion.  no  tenderness or  deformity.  Neurological:  normal muscle tone. Coordination normal. No atrophy Skin: Skin is warm and dry. No rash noted. not diaphoretic.  Psychiatric:  normal mood and affect. behavior is normal. Thought content normal.   Recent Labs: 09/02/2017: ALT 24; BUN 30; Creatinine, Ser 0.89; Hemoglobin 11.6; Platelets 554; Potassium 4.4; Sodium 138    Lipid Panel Lab Results  Component Value Date   CHOL 120 09/19/2015   HDL 59.60 09/19/2015   LDLCALC 48 09/19/2015   TRIG 60.0 09/19/2015      Wt Readings from Last 3 Encounters:  12/23/17 93 lb (42.2 kg)  12/13/17 91 lb 4.8 oz (41.4 kg)  11/14/17 89 lb 14.4 oz (40.8 kg)       ASSESSMENT AND PLAN:   Paroxysmal atrial fibrillation (HCC) - Plan: EKG 12-Lead Maintaining normal sinus rhythm, currently not on anticoagulation secondary to high risk of falls  prior trauma to her legs .  Walks with a walker  Recent fall with hip fracture She  denies any symptoms concerning  for arrhythmia  Essential hypertension Blood pressure recently elevated but over the past day or so has been normal 175 systolic Recommended to daughter that we continue to monitor pressure for now Several days ago was 102 systolic unclear if this was in the setting of pain.  Currently only on carvedilol  Cardiomyopathy, ischemic Previous echocardiogram several years ago ejection fraction 40-45% In November 2016  No further testing at this time Continue Coreg.  Does not drink much, currently not on Lasix  Mixed hyperlipidemia Cholesterol is at goal on the current lipid regimen. No changes to the medications were made.  Stable  CAD in native artery Discussed holding the brilinta. We will restart low-dose aspirin 81 mg daily   Systolic and diastolic CHF, acute on chronic (HCC) Appears relatively euvolemic  No changes to her medications.  Very trace edema around the ankles Monitor for now  Anorexia Lost 10 pounds in October after a fall and hip fracture She is trying to gain her weight back  extensive hospital records reviewed from October 2018, fall with hip fracture, hypotension, UTI anemia  Total encounter time more than 45 minutes  Greater than 50% was spent in counseling and coordination of care with the patient   Disposition:   F/U  6 months   Orders Placed This Encounter  Procedures  . EKG 12-Lead     Signed, Esmond Plants, M.D., Ph.D. 12/23/2017  [place], [place]

## 2017-12-23 ENCOUNTER — Encounter: Payer: Self-pay | Admitting: Cardiovascular Disease

## 2017-12-23 ENCOUNTER — Ambulatory Visit (INDEPENDENT_AMBULATORY_CARE_PROVIDER_SITE_OTHER): Payer: Medicare Other | Admitting: Cardiovascular Disease

## 2017-12-23 VITALS — BP 148/60 | HR 77 | Ht 60.0 in | Wt 93.0 lb

## 2017-12-23 DIAGNOSIS — I10 Essential (primary) hypertension: Secondary | ICD-10-CM | POA: Diagnosis not present

## 2017-12-23 DIAGNOSIS — I5043 Acute on chronic combined systolic (congestive) and diastolic (congestive) heart failure: Secondary | ICD-10-CM | POA: Diagnosis not present

## 2017-12-23 DIAGNOSIS — I25118 Atherosclerotic heart disease of native coronary artery with other forms of angina pectoris: Secondary | ICD-10-CM

## 2017-12-23 DIAGNOSIS — I471 Supraventricular tachycardia: Secondary | ICD-10-CM

## 2017-12-23 DIAGNOSIS — I480 Paroxysmal atrial fibrillation: Secondary | ICD-10-CM

## 2017-12-23 DIAGNOSIS — E782 Mixed hyperlipidemia: Secondary | ICD-10-CM | POA: Diagnosis not present

## 2017-12-23 NOTE — Patient Instructions (Addendum)
Medication Instructions:   Please restart aspirin one a day  Please monitor blood pressure  Labwork:  No new labs needed  Testing/Procedures:  No further testing at this time   Follow-Up: It was a pleasure seeing you in the office today. Please call us if you have new issues that need to be addressed before your next appt.  [phone]  Your physician wants you to follow-up in: 6 months.  You will receive a reminder letter in the mail two months in advance. If you don't receive a letter, please call our office to schedule the follow-up appointment.  If you need a refill on your cardiac medications before your next appointment, please call your pharmacy.  For educational health videos Log in to : [email or web address] Or : [email or web address], password : [email or web address]

## 2017-12-27 ENCOUNTER — Encounter
Admission: RE | Admit: 2017-12-27 | Discharge: 2017-12-27 | Disposition: A | Discharge status: Home / Self Care | Payer: Medicare Other | Source: Ambulatory Visit | Attending: Internal Medicine | Admitting: Internal Medicine

## 2018-01-22 ENCOUNTER — Non-Acute Institutional Stay: Payer: Medicare Other | Admitting: Gerontology

## 2018-01-22 ENCOUNTER — Encounter: Payer: Self-pay | Admitting: Gerontology

## 2018-01-22 DIAGNOSIS — I10 Essential (primary) hypertension: Secondary | ICD-10-CM

## 2018-01-22 DIAGNOSIS — I951 Orthostatic hypotension: Secondary | ICD-10-CM

## 2018-01-22 DIAGNOSIS — I255 Ischemic cardiomyopathy: Secondary | ICD-10-CM | POA: Diagnosis not present

## 2018-01-22 NOTE — Assessment & Plan Note (Signed)
Stable, but slowly increasing. Monitor for worsening hypertension. Pt denies chest pain or shortness of breath. Symptoms controlled on Coreg 3.125 mg po BID

## 2018-01-22 NOTE — Progress Notes (Signed)
Location:    Nursing Home Room Number: 283T Place of Service:  ALF [phone]) Provider:  Toni Arthurs, NP-C  Leone Haven, MD  Patient Care Team: Leone Haven, MD as PCP - General (Family Medicine) Minna Merritts, MD as Consulting Physician (Cardiology)  Extended Emergency Contact Information Primary Emergency Contact: Rodriguez,Katherine Address: [address]          [place], [place] [address] [name] of [place] Phone: [phone] Work Phone: [phone] Mobile Phone: [phone] Relation: Daughter Secondary Emergency Contact: Rodriguez,Ray Address: [address]          [place]          [place], [place] [address] [place] Phone: [phone] Relation: Relative  Code Status:  FULL Goals of care: Advanced Directive information Advanced Directives 01/22/2018  Does Patient Have a Medical Advance Directive? Yes  Type of Advance Directive Out of facility DNR (pink MOST or yellow form)  Does patient want to make changes to medical advance directive? No - Patient declined  Copy of [place] in Chart? -  Would patient like information on creating a medical advance directive? -     Chief Complaint  Patient presents with  . Medical Management of Chronic Issues    Routine Visit    HPI:  Pt is a 84 y.o. female seen today for medical management of chronic diseases.    Orthostatic hypotension Resolved. No recent issues with hypotension. Denies dizziness, chest pain or shortness of breath.  Hypertension Stable, but slowly increasing. Monitor for worsening hypertension. Pt denies chest pain or shortness of breath. Symptoms controlled on Coreg 3.125 mg po BID  Cardiomyopathy, ischemic Stable. No edema. On ASA 81 mg po Q Day.    Past Medical History:  Diagnosis Date  . Coronary artery disease 03/06/13   90% prox LAD stenosis s/p DES  . Difficult intubation   . Dizziness   . GERD (gastroesophageal reflux  disease)   . Hypotension   . Ischemic cardiomyopathy 03/09/13   s/p NSTEMI. EF 30-35%.  . MI (myocardial infarction) ([place])   . PAF (paroxysmal atrial fibrillation) ([place]) 03/06/13   In setting of NSTEMI and reperfusion, no recurrence  . SVT (supraventricular tachycardia) (HCC)    Self-limited on outpatient cardiac monitor   Past Surgical History:  Procedure Laterality Date  . CESAREAN SECTION    . CORONARY ANGIOPLASTY WITH STENT PLACEMENT  02/2013   DES-mid LAD, mild LCx, RCA stenoses  . INTRAMEDULLARY (IM) NAIL INTERTROCHANTERIC Right 08/19/2017   Procedure: INTRAMEDULLARY (IM) NAIL INTERTROCHANTRIC;  Surgeon: Hessie Knows, MD;  Location: [facility] ORS;  Service: Orthopedics;  Laterality: Right;    Allergies  Allergen Reactions  . Penicillins     Has patient had a PCN reaction causing immediate rash, facial/tongue/throat swelling, SOB or lightheadedness with hypotension: Unknown Has patient had a PCN reaction causing severe rash involving mucus membranes or skin necrosis: Unknown Has patient had a PCN reaction that required hospitalization No Has patient had a PCN reaction occurring within the last 10 years: No If all of the above answers are "NO", then may proceed with Cephalosporin use.    Allergies as of 01/22/2018      Reactions   Penicillins    Has patient had a PCN reaction causing immediate rash, facial/tongue/throat swelling, SOB or lightheadedness with hypotension: Unknown Has patient had a PCN reaction causing severe rash involving mucus membranes or skin necrosis: Unknown Has patient had a PCN reaction that required  hospitalization No Has patient had a PCN reaction occurring within the last 10 years: No If all of the above answers are "NO", then may proceed with Cephalosporin use.      Medication List        Accurate as of 01/22/18  4:23 PM. Always use your most recent med list.          aspirin EC 81 MG tablet Take 1 tablet (81 mg total) by mouth daily.     atorvastatin 20 MG tablet Commonly known as:  LIPITOR TAKE 1 TABLET BY MOUTH AT BEDTIME   BREO ELLIPTA 100-25 MCG/INH Aepb Generic drug:  fluticasone furoate-vilanterol Inhale 1 puff into the lungs daily.   carvedilol 3.125 MG tablet Commonly known as:  COREG TAKE ONE TABLET TWICE A DAY WITH MEALS.   Cholecalciferol 4000 units Caps Take 1 capsule by mouth every other day.   cyanocobalamin 1000 MCG/ML injection Commonly known as:  (VITAMIN B-12) Inject 1,000 mcg into the muscle every 30 (thirty) days. On 22nd of the month   docusate sodium 100 MG capsule Commonly known as:  COLACE Take 100 mg by mouth 2 (two) times daily as needed.   ENSURE ENLIVE PO Take 1 Bottle by mouth 2 (two) times daily between meals.   feeding supplement (PRO-STAT SUGAR FREE 64) Liqd Take 30 mLs by mouth 2 (two) times daily between meals.   ferrous sulfate 325 (65 FE) MG tablet Take 325 mg by mouth every other day. With meals   ipratropium-albuterol 0.5-2.5 (3) MG/3ML Soln Commonly known as:  DUONEB Take 3 mLs every 6 (six) hours as needed by nebulization.   loratadine 10 MG tablet Commonly known as:  CLARITIN Take 1 tablet (10 mg total) by mouth daily as needed for allergies.   mirtazapine 7.5 MG tablet Commonly known as:  REMERON Take 7.5 mg at bedtime by mouth.   nitroGLYCERIN 0.4 MG SL tablet Commonly known as:  NITROSTAT Place 0.4 mg under the tongue every 5 (five) minutes as needed for chest pain.   ranitidine 75 MG tablet Commonly known as:  ZANTAC Take 75 mg 2 (two) times daily by mouth.   vitamin C 1000 MG tablet Take 1,000 mg by mouth daily.       Review of Systems  Constitutional: Negative for activity change, appetite change, chills, diaphoresis and fever.  HENT: Negative for congestion, mouth sores, nosebleeds, postnasal drip, sneezing, sore throat, trouble swallowing and voice change.   Respiratory: Negative for apnea, cough, choking, chest tightness, shortness of  breath and wheezing.   Cardiovascular: Negative for chest pain, palpitations and leg swelling.  Gastrointestinal: Negative for abdominal distention, abdominal pain, constipation, diarrhea and nausea.  Genitourinary: Negative for difficulty urinating, dysuria, frequency and urgency.  Musculoskeletal: Positive for arthralgias (typical arthritis). Negative for back pain, gait problem and myalgias.  Skin: Negative for color change, pallor, rash and wound.  Neurological: Positive for weakness. Negative for dizziness, tremors, syncope, speech difficulty, numbness and headaches.  Psychiatric/Behavioral: Negative for agitation and behavioral problems.  All other systems reviewed and are negative.   Immunization History  Administered Date(s) Administered  . Influenza-Unspecified 07/30/2014, 07/17/2017  . Pneumococcal Conjugate-13 08/10/2014   Pertinent  Health Maintenance Due  Topic Date Due  . PNA vac Low Risk Adult (2 of 2 - PPSV23) 08/11/2015  . INFLUENZA VACCINE  Completed  . DEXA SCAN  Completed   Fall Risk  11/26/2016  Falls in the past year? No   Functional Status Survey:  Vitals:   01/22/18 1527  BP: (!) 162/40  Weight: 94 lb 1.6 oz (42.7 kg)  Height: 5' (1.524 m)   Body mass index is 18.38 kg/m. Physical Exam  Constitutional: Vital signs are normal. She appears well-developed and well-nourished. She is active and cooperative. She does not appear ill. No distress.  HENT:  Head: Normocephalic and atraumatic.  Mouth/Throat: Uvula is midline, oropharynx is clear and moist and mucous membranes are normal. Mucous membranes are not pale, not dry and not cyanotic.  Eyes: Pupils are equal, round, and reactive to light. Conjunctivae, EOM and lids are normal.  Neck: Trachea normal, normal range of motion and full passive range of motion without pain. Neck supple. No JVD present. No tracheal deviation, no edema and no erythema present. No thyromegaly present.  Cardiovascular: Normal  rate, regular rhythm, normal heart sounds, intact distal pulses and normal pulses. Exam reveals no gallop, no distant heart sounds and no friction rub.  No murmur heard. Pulses:      Dorsalis pedis pulses are 2+ on the right side, and 2+ on the left side.  No edema  Pulmonary/Chest: Effort normal and breath sounds normal. No accessory muscle usage. No respiratory distress. She has no decreased breath sounds. She has no wheezes. She has no rhonchi. She has no rales. She exhibits no tenderness.  Abdominal: Soft. Normal appearance and bowel sounds are normal. She exhibits no distension and no ascites. There is no tenderness.  Musculoskeletal: Normal range of motion. She exhibits no edema or tenderness.  Expected osteoarthritis, stiffness; Bilateral Calves soft, supple. Negative Homan's Sign. B- pedal pulses equal; ambulatory with rolling walker  Neurological: She is alert. She has normal strength. She exhibits abnormal muscle tone. Coordination and gait abnormal.  Skin: Skin is warm, dry and intact. She is not diaphoretic. No cyanosis. No pallor. Nails show no clubbing.  Psychiatric: She has a normal mood and affect. Her speech is normal and behavior is normal. Judgment and thought content normal. Cognition and memory are normal.  Nursing note and vitals reviewed.   Labs reviewed: Recent Labs    08/26/17 0450 08/30/17 1200 09/02/17 1122  NA 136 137 138  K 4.1 4.1 4.4  CL 110 108 106  CO2 21* 22 24  GLUCOSE 103* 103* 127*  BUN 25* 28* 30*  CREATININE 0.91 1.02* 0.89  CALCIUM 7.4* 8.3* 8.6*   Recent Labs    08/24/17 1200 09/02/17 1122  AST 34 36  ALT 22 24  ALKPHOS 71 111  BILITOT 2.4* 1.3*  PROT 5.1* 6.0*  ALBUMIN 2.4* 2.7*   Recent Labs    08/26/17 0450 08/30/17 1200 09/02/17 1122  WBC 17.3* 16.8* 11.7*  NEUTROABS 11.6* 13.9* 9.4*  HGB 9.3* 10.7* 11.6*  HCT 27.5* 33.0* 35.8  MCV 91.3 96.6 96.8  PLT 319 538* 554*   Lab Results  Component Value Date   TSH 6.520 (H)  09/23/2015   Lab Results  Component Value Date   HGBA1C 5.9 09/23/2015   Lab Results  Component Value Date   CHOL 120 09/19/2015   HDL 59.60 09/19/2015   LDLCALC 48 09/19/2015   LDLDIRECT 60.0 08/23/2016   TRIG 60.0 09/19/2015   CHOLHDL 2 09/19/2015    Significant Diagnostic Results in last 30 days:  No results found.  Assessment/Plan Asani was seen today for medical management of chronic issues.  Diagnoses and all orders for this visit:  Orthostatic hypotension  Essential hypertension  Cardiomyopathy, ischemic   Above listed conditions stable  Continue current medication regimen  Monitor for increased blood pressures  Monitor for dizziness, syncope  Encourage po fluid intake  Safety precautions  Family/ staff Communication:   Total Time:  Documentation:  Face to Face:  Family/Phone:   Labs/tests ordered:  Not due  Medication list reviewed and assessed for continued appropriateness. Monthly medication orders reviewed and signed.  Vikki Ports, NP-C [facility] [phone] [place],  [address] Cell Phone (Mon-Fri 8am-5pm):  [phone] On Call:  [phone] & follow prompts after 5pm & weekends Office Phone:  [phone] Office Fax:  [phone]

## 2018-01-22 NOTE — Assessment & Plan Note (Signed)
Stable. No edema. On ASA 81 mg po Q Day.

## 2018-01-22 NOTE — Progress Notes (Signed)
Opened in error; Disregard.

## 2018-01-22 NOTE — Assessment & Plan Note (Signed)
Resolved. No recent issues with hypotension. Denies dizziness, chest pain or shortness of breath.

## 2018-01-23 NOTE — Progress Notes (Signed)
Location:    Nursing Home Room Number: 623J Place of Service:  ALF [phone]) Provider:  Toni Arthurs, NP-C  Leone Haven, MD  Patient Care Team: Leone Haven, MD as PCP - General (Family Medicine) Minna Merritts, MD as Consulting Physician (Cardiology)  Extended Emergency Contact Information Primary Emergency Contact: Rodriguez,Katherine Address: [address]          [place], [place] [address] [name] of [place] Phone: [phone] Work Phone: [phone] Mobile Phone: [phone] Relation: Daughter Secondary Emergency Contact: Rodriguez,Ray Address: [address]          [place]          [place], [place] [address] [place] Phone: [phone] Relation: Relative  Code Status:  FULL Goals of care: Advanced Directive information Advanced Directives 01/22/2018  Does Patient Have a Medical Advance Directive? Yes  Type of Advance Directive Out of facility DNR (pink MOST or yellow form)  Does patient want to make changes to medical advance directive? No - Patient declined  Copy of [place] in Chart? -  Would patient like information on creating a medical advance directive? -     Chief Complaint  Patient presents with  . Medical Management of Chronic Issues    Routine Visit    HPI:  Pt is a 84 y.o. female seen today for medical management of chronic diseases.    CAD in native artery Stable. No complaints of chest pain or shortness of breath. On ASA 81 mg po Q Day, Nitrostat 0.4 mg Q 5 minutes prn.   Cutaneous lupus erythematosus Stable. No recent exacerbations. Refer to dermatology if necessary  Closed right hip fracture ([place]) Resolved. Pt denies pain. Mobile with rolling walker. No pain medications required lately    Past Medical History:  Diagnosis Date  . Coronary artery disease 03/06/13   90% prox LAD stenosis s/p DES  . Difficult intubation   . Dizziness   . GERD (gastroesophageal reflux  disease)   . Hypotension   . Ischemic cardiomyopathy 03/09/13   s/p NSTEMI. EF 30-35%.  . MI (myocardial infarction) ([place])   . PAF (paroxysmal atrial fibrillation) ([place]) 03/06/13   In setting of NSTEMI and reperfusion, no recurrence  . SVT (supraventricular tachycardia) (H[facility])    Self-limited on outpatient cardiac monitor   Past Surgical History:  Procedure Laterality Date  . CESAREAN SECTION    . CORONARY ANGIOPLASTY WITH STENT PLACEMENT  02/2013   DES-mid LAD, mild LCx, RCA stenoses  . INTRAMEDULLARY (IM) NAIL INTERTROCHANTERIC Right 08/19/2017   Procedure: INTRAMEDULLARY (IM) NAIL INTERTROCHANTRIC;  Surgeon: Hessie Knows, MD;  Location: [facility] ORS;  Service: Orthopedics;  Laterality: Right;    Allergies  Allergen Reactions  . Penicillins     Has patient had a PCN reaction causing immediate rash, facial/tongue/throat swelling, SOB or lightheadedness with hypotension: Unknown Has patient had a PCN reaction causing severe rash involving mucus membranes or skin necrosis: Unknown Has patient had a PCN reaction that required hospitalization No Has patient had a PCN reaction occurring within the last 10 years: No If all of the above answers are "NO", then may proceed with Cephalosporin use.    Allergies as of 12/13/2017      Reactions   Penicillins    Has patient had a PCN reaction causing immediate rash, facial/tongue/throat swelling, SOB or lightheadedness with hypotension: Unknown Has patient had a PCN reaction causing severe rash involving mucus membranes or skin necrosis: Unknown Has patient  had a PCN reaction that required hospitalization No Has patient had a PCN reaction occurring within the last 10 years: No If all of the above answers are "NO", then may proceed with Cephalosporin use.      Medication List        Accurate as of 12/13/17 11:59 PM. Always use your most recent med list.          atorvastatin 20 MG tablet Commonly known as:  LIPITOR TAKE 1 TABLET BY MOUTH AT  BEDTIME   BREO ELLIPTA 100-25 MCG/INH Aepb Generic drug:  fluticasone furoate-vilanterol Inhale 1 puff into the lungs daily.   carvedilol 3.125 MG tablet Commonly known as:  COREG TAKE ONE TABLET TWICE A DAY WITH MEALS.   Cholecalciferol 4000 units Caps Take 1 capsule by mouth every other day.   cyanocobalamin 1000 MCG/ML injection Commonly known as:  (VITAMIN B-12) Inject 1,000 mcg into the muscle every 30 (thirty) days. On 22nd of the month   docusate sodium 100 MG capsule Commonly known as:  COLACE Take 100 mg by mouth 2 (two) times daily as needed.   ENSURE ENLIVE PO Take 1 Bottle by mouth 2 (two) times daily between meals.   feeding supplement (PRO-STAT SUGAR FREE 64) Liqd Take 30 mLs by mouth 2 (two) times daily between meals.   ferrous sulfate 325 (65 FE) MG tablet Take 325 mg by mouth every other day. With meals   ipratropium-albuterol 0.5-2.5 (3) MG/3ML Soln Commonly known as:  DUONEB Take 3 mLs every 6 (six) hours as needed by nebulization.   loratadine 10 MG tablet Commonly known as:  CLARITIN Take 1 tablet (10 mg total) by mouth daily as needed for allergies.   mirtazapine 7.5 MG tablet Commonly known as:  REMERON Take 7.5 mg at bedtime by mouth.   nitroGLYCERIN 0.4 MG SL tablet Commonly known as:  NITROSTAT Place 0.4 mg under the tongue every 5 (five) minutes as needed for chest pain.   ranitidine 75 MG tablet Commonly known as:  ZANTAC Take 75 mg 2 (two) times daily by mouth.   vitamin C 1000 MG tablet Take 1,000 mg by mouth daily.       Review of Systems  Constitutional: Negative for activity change, appetite change, chills, diaphoresis and fever.  HENT: Negative for congestion, mouth sores, nosebleeds, postnasal drip, sneezing, sore throat, trouble swallowing and voice change.   Respiratory: Negative for apnea, cough, choking, chest tightness, shortness of breath and wheezing.   Cardiovascular: Negative for chest pain, palpitations and leg  swelling.  Gastrointestinal: Negative for abdominal distention, abdominal pain, constipation, diarrhea and nausea.  Genitourinary: Negative for difficulty urinating, dysuria, frequency and urgency.  Musculoskeletal: Positive for arthralgias (typical arthritis). Negative for back pain, gait problem and myalgias.  Skin: Negative for color change, pallor, rash and wound.  Neurological: Positive for weakness. Negative for dizziness, tremors, syncope, speech difficulty, numbness and headaches.  Psychiatric/Behavioral: Negative for agitation and behavioral problems.  All other systems reviewed and are negative.   Immunization History  Administered Date(s) Administered  . Influenza-Unspecified 07/30/2014, 07/17/2017  . Pneumococcal Conjugate-13 08/10/2014   Pertinent  Health Maintenance Due  Topic Date Due  . PNA vac Low Risk Adult (2 of 2 - PPSV23) 08/11/2015  . INFLUENZA VACCINE  Completed  . DEXA SCAN  Completed   Fall Risk  11/26/2016  Falls in the past year? No   Functional Status Survey:    Vitals:   12/13/17 0942  BP: (!) 166/65  Pulse:  83  Resp: 16  Temp: 98.6 F (37 C)  TempSrc: Oral  SpO2: 98%  Weight: 91 lb 4.8 oz (41.4 kg)  Height: 5\' 3"  (1.6 m)   Body mass index is 16.17 kg/m. Physical Exam  Constitutional: She is oriented to person, place, and time. Vital signs are normal. She appears well-developed and well-nourished. She is active and cooperative. She does not appear ill. No distress.  HENT:  Head: Normocephalic and atraumatic.  Mouth/Throat: Uvula is midline, oropharynx is clear and moist and mucous membranes are normal. Mucous membranes are not pale, not dry and not cyanotic.  Eyes: Pupils are equal, round, and reactive to light. Conjunctivae, EOM and lids are normal.  Neck: Trachea normal, normal range of motion and full passive range of motion without pain. Neck supple. No JVD present. No tracheal deviation, no edema and no erythema present. No thyromegaly  present.  Cardiovascular: Normal rate, regular rhythm, normal heart sounds, intact distal pulses and normal pulses. Exam reveals no gallop, no distant heart sounds and no friction rub.  No murmur heard. Pulses:      Dorsalis pedis pulses are 2+ on the right side, and 2+ on the left side.  No edema  Pulmonary/Chest: Effort normal and breath sounds normal. No accessory muscle usage. No respiratory distress. She has no decreased breath sounds. She has no wheezes. She has no rhonchi. She has no rales. She exhibits no tenderness.  Abdominal: Soft. Normal appearance and bowel sounds are normal. She exhibits no distension and no ascites. There is no tenderness.  Musculoskeletal: Normal range of motion. She exhibits no edema or tenderness.  Expected osteoarthritis, stiffness; Bilateral Calves soft, supple. Negative Homan's Sign. B- pedal pulses equal; h/o right hip fracture; mobile in facility with rolling walker  Neurological: She is alert and oriented to person, place, and time. She has normal strength. She exhibits abnormal muscle tone. Gait abnormal.  Skin: Skin is warm, dry and intact. She is not diaphoretic. No cyanosis. No pallor. Nails show no clubbing.  Psychiatric: She has a normal mood and affect. Her speech is normal and behavior is normal. Judgment and thought content normal. Cognition and memory are normal.  Nursing note and vitals reviewed.   Labs reviewed: Recent Labs    08/26/17 0450 08/30/17 1200 09/02/17 1122  NA 136 137 138  K 4.1 4.1 4.4  CL 110 108 106  CO2 21* 22 24  GLUCOSE 103* 103* 127*  BUN 25* 28* 30*  CREATININE 0.91 1.02* 0.89  CALCIUM 7.4* 8.3* 8.6*   Recent Labs    08/24/17 1200 09/02/17 1122  AST 34 36  ALT 22 24  ALKPHOS 71 111  BILITOT 2.4* 1.3*  PROT 5.1* 6.0*  ALBUMIN 2.4* 2.7*   Recent Labs    08/26/17 0450 08/30/17 1200 09/02/17 1122  WBC 17.3* 16.8* 11.7*  NEUTROABS 11.6* 13.9* 9.4*  HGB 9.3* 10.7* 11.6*  HCT 27.5* 33.0* 35.8  MCV  91.3 96.6 96.8  PLT 319 538* 554*   Lab Results  Component Value Date   TSH 6.520 (H) 09/23/2015   Lab Results  Component Value Date   HGBA1C 5.9 09/23/2015   Lab Results  Component Value Date   CHOL 120 09/19/2015   HDL 59.60 09/19/2015   LDLCALC 48 09/19/2015   LDLDIRECT 60.0 08/23/2016   TRIG 60.0 09/19/2015   CHOLHDL 2 09/19/2015    Significant Diagnostic Results in last 30 days:  No results found.  Assessment/Plan L[place] was seen today for medical  management of chronic issues.  Diagnoses and all orders for this visit:  CAD in native artery  Cutaneous lupus erythematosus  Closed fracture of right hip with routine healing, subsequent encounter   Above listed conditions stable  Continue current medication regimen  Encourage ambulation with rolling walker  Continue to encourage participation in activities and interactions with other residents  Monitor for signs of lupus exacerbations  Nitro-stat prn for chest pains  Refer to Dermatology if necessary  Family/ staff Communication:   Total Time:  Documentation:  Face to Face:  Family/Phone:   Labs/tests ordered:  none  Medication list reviewed and assessed for continued appropriateness. Monthly medication orders reviewed and signed.  Vikki Ports, NP-C Geriatrics [facility] [phone], [place] [address] Cell Phone (Mon-Fri 8am-5pm):  [phone] On Call:  [phone] & follow prompts after 5pm & weekends Office Phone:  [phone] Office Fax:  [phone]

## 2018-01-23 NOTE — Assessment & Plan Note (Signed)
Stable. No recent exacerbations. Refer to dermatology if necessary

## 2018-01-23 NOTE — Assessment & Plan Note (Signed)
Stable. No complaints of chest pain or shortness of breath. On ASA 81 mg po Q Day, Nitrostat 0.4 mg Q 5 minutes prn.

## 2018-01-23 NOTE — Assessment & Plan Note (Signed)
Resolved. Pt denies pain. Mobile with rolling walker. No pain medications required lately

## 2018-01-24 ENCOUNTER — Ambulatory Visit: Payer: Medicare Other

## 2018-01-27 ENCOUNTER — Encounter
Admission: RE | Admit: 2018-01-27 | Discharge: 2018-01-27 | Disposition: A | Discharge status: Home / Self Care | Payer: Medicare Other | Source: Ambulatory Visit | Attending: Internal Medicine | Admitting: Internal Medicine

## 2018-01-31 DIAGNOSIS — I255 Ischemic cardiomyopathy: Secondary | ICD-10-CM | POA: Diagnosis not present

## 2018-01-31 DIAGNOSIS — I25119 Atherosclerotic heart disease of native coronary artery with unspecified angina pectoris: Secondary | ICD-10-CM | POA: Diagnosis not present

## 2018-01-31 DIAGNOSIS — E441 Mild protein-calorie malnutrition: Secondary | ICD-10-CM | POA: Diagnosis not present

## 2018-02-21 ENCOUNTER — Non-Acute Institutional Stay: Payer: Medicare Other | Admitting: Gerontology

## 2018-02-21 ENCOUNTER — Encounter: Payer: Self-pay | Admitting: Gerontology

## 2018-02-21 DIAGNOSIS — D510 Vitamin B12 deficiency anemia due to intrinsic factor deficiency: Secondary | ICD-10-CM

## 2018-02-21 DIAGNOSIS — R0981 Nasal congestion: Secondary | ICD-10-CM | POA: Diagnosis not present

## 2018-02-21 DIAGNOSIS — R002 Palpitations: Secondary | ICD-10-CM

## 2018-02-22 NOTE — Assessment & Plan Note (Signed)
Stable. Symptoms managed with Claritin 10 mg po Q Day PRN. No recent c/o congestion

## 2018-02-22 NOTE — Assessment & Plan Note (Addendum)
Stable. No recent complaints of exacerbations. Symptoms controlled with Coreg 3.125 mg po BID . Denies chest pain or shortness of breath.

## 2018-02-22 NOTE — Progress Notes (Signed)
Location:    Nursing Home Room Number: 644I Place of Service:  ALF [phone]) Provider:  Toni Arthurs, NP-C  Leone Haven, MD  Patient Care Team: Leone Haven, MD as PCP - General (Family Medicine) Minna Merritts, MD as Consulting Physician (Cardiology)  Extended Emergency Contact Information Primary Emergency Contact: Linda Hart,Linda Hart Address: [address]          [place], [place] [address] [name] of [place] Phone: [phone] Work Phone: [phone] Mobile Phone: [phone] Relation: Daughter Secondary Emergency Contact: Linda Hart,Linda Hart Address: [address]          [place]          [place], [place] [address] [place] Phone: [phone] Relation: Relative  Code Status:  DNR Goals of care: Advanced Directive information Advanced Directives 02/21/2018  Does Patient Have a Medical Advance Directive? Yes  Type of Advance Directive Out of facility DNR (pink MOST or yellow form)  Does patient want to make changes to medical advance directive? No - Patient declined  Copy of [place] in Chart? -  Would patient like information on creating a medical advance directive? -     Chief Complaint  Patient presents with  . Medical Management of Chronic Issues    Routine Visit    HPI:  Pt is a 84 y.o. female seen today for medical management of chronic diseases.    Pernicious anemia Stable. Pt continues on Cyanocobalamin 1,000 mcg IM Q month. No recent levels obtained. Will assess labs, etc. Before next assessment.  PALPITATIONS Stable. No recent complaints of exacerbations. Symptoms controlled with Coreg 3.125 mg po BID . Denies chest pain or shortness of breath.   Nasal congestion Stable. Symptoms managed with Claritin 10 mg po Q Day PRN. No recent c/o congestion  Please note pt with limited verbal/cognitive ability. Unable to obtain complete ROS. Some ROS info obtained from staff and  documentation.   Past Medical History:  Diagnosis Date  . Coronary artery disease 03/06/13   90% prox LAD stenosis s/p DES  . Difficult intubation   . Dizziness   . GERD (gastroesophageal reflux disease)   . Hypotension   . Ischemic cardiomyopathy 03/09/13   s/p NSTEMI. EF 30-35%.  . MI (myocardial infarction) ([place])   . PAF (paroxysmal atrial fibrillation) ([place]) 03/06/13   In setting of NSTEMI and reperfusion, no recurrence  . SVT (supraventricular tachycardia) ([facility])    Self-limited on outpatient cardiac monitor   Past Surgical History:  Procedure Laterality Date  . CESAREAN SECTION    . CORONARY ANGIOPLASTY WITH STENT PLACEMENT  02/2013   DES-mid LAD, mild LCx, RCA stenoses  . INTRAMEDULLARY (IM) NAIL INTERTROCHANTERIC Right 08/19/2017   Procedure: INTRAMEDULLARY (IM) NAIL INTERTROCHANTRIC;  Surgeon: Hessie Knows, MD;  Location: [facility] ORS;  Service: Orthopedics;  Laterality: Right;    Allergies  Allergen Reactions  . Penicillins     Has patient had a PCN reaction causing immediate rash, facial/tongue/throat swelling, SOB or lightheadedness with hypotension: Unknown Has patient had a PCN reaction causing severe rash involving mucus membranes or skin necrosis: Unknown Has patient had a PCN reaction that required hospitalization No Has patient had a PCN reaction occurring within the last 10 years: No If all of the above answers are "NO", then may proceed with Cephalosporin use.    Allergies as of 02/21/2018      Reactions   Penicillins    Has patient had a PCN reaction causing immediate rash, facial/tongue/throat  swelling, SOB or lightheadedness with hypotension: Unknown Has patient had a PCN reaction causing severe rash involving mucus membranes or skin necrosis: Unknown Has patient had a PCN reaction that required hospitalization No Has patient had a PCN reaction occurring within the last 10 years: No If all of the above answers are "NO", then may proceed with Cephalosporin use.       Medication List        Accurate as of 02/21/18 11:59 PM. Always use your most recent med list.          aspirin EC 81 MG tablet Take 1 tablet (81 mg total) by mouth daily.   atorvastatin 20 MG tablet Commonly known as:  LIPITOR TAKE 1 TABLET BY MOUTH AT BEDTIME   BREO ELLIPTA 100-25 MCG/INH Aepb Generic drug:  fluticasone furoate-vilanterol Inhale 1 puff into the lungs daily.   carvedilol 3.125 MG tablet Commonly known as:  COREG TAKE ONE TABLET TWICE A DAY WITH MEALS.   Cholecalciferol 4000 units Caps Take 1 capsule by mouth every other day.   cyanocobalamin 1000 MCG/ML injection Commonly known as:  (VITAMIN B-12) Inject 1,000 mcg into the muscle every 30 (thirty) days. On 22nd of the month   docusate sodium 100 MG capsule Commonly known as:  COLACE Take 100 mg by mouth 2 (two) times daily as needed.   ENSURE ENLIVE PO Take 1 Bottle by mouth 2 (two) times daily between meals.   ferrous sulfate 325 (65 FE) MG tablet Take 325 mg by mouth every other day. With meals   ipratropium-albuterol 0.5-2.5 (3) MG/3ML Soln Commonly known as:  DUONEB Take 3 mLs every 6 (six) hours as needed by nebulization.   loratadine 10 MG tablet Commonly known as:  CLARITIN Take 1 tablet (10 mg total) by mouth daily as needed for allergies.   mirtazapine 7.5 MG tablet Commonly known as:  REMERON Take 7.5 mg at bedtime by mouth.   nitroGLYCERIN 0.4 MG SL tablet Commonly known as:  NITROSTAT Place 0.4 mg under the tongue every 5 (five) minutes as needed for chest pain.   ranitidine 75 MG tablet Commonly known as:  ZANTAC Take 75 mg 2 (two) times daily by mouth.   spironolactone 25 MG tablet Commonly known as:  ALDACTONE Take 12.5 mg by mouth daily.   vitamin C 1000 MG tablet Take 1,000 mg by mouth daily.       Review of Systems  Constitutional: Negative for activity change, appetite change, chills, diaphoresis and fever.  HENT: Negative for congestion, mouth sores,  nosebleeds, postnasal drip, sneezing, sore throat, trouble swallowing and voice change.   Respiratory: Negative for apnea, cough, choking, chest tightness, shortness of breath and wheezing.   Cardiovascular: Negative for chest pain, palpitations and leg swelling.  Gastrointestinal: Negative for abdominal distention, abdominal pain, constipation, diarrhea and nausea.  Genitourinary: Negative for difficulty urinating, dysuria, frequency and urgency.  Musculoskeletal: Positive for gait problem. Negative for back pain and myalgias. Arthralgias: typical arthritis.  Skin: Negative for color change, pallor, rash and wound.  Neurological: Positive for weakness. Negative for dizziness, tremors, syncope, speech difficulty, numbness and headaches.  Psychiatric/Behavioral: Negative for agitation and behavioral problems.  All other systems reviewed and are negative.   Immunization History  Administered Date(s) Administered  . Influenza-Unspecified 07/30/2014, 07/17/2017  . Pneumococcal Conjugate-13 08/10/2014   Pertinent  Health Maintenance Due  Topic Date Due  . PNA vac Low Risk Adult (2 of 2 - PPSV23) 08/11/2015  . INFLUENZA VACCINE  05/29/2018  .  DEXA SCAN  Completed   Fall Risk  11/26/2016  Falls in the past year? No   Functional Status Survey:    Vitals:   02/21/18 0926  BP: (!) 144/47  Pulse: 77  Resp: 18  Temp: (!) 96 F (35.6 C)  SpO2: 98%  Weight: 94 lb 11.2 oz (43 kg)  Height: 5' (1.524 m)   Body mass index is 18.49 kg/m. Physical Exam  Constitutional: She is oriented to person, place, and time. Vital signs are normal. She appears well-developed and well-nourished. She is active and cooperative. She does not appear ill. No distress.  HENT:  Head: Normocephalic and atraumatic.  Mouth/Throat: Uvula is midline, oropharynx is clear and moist and mucous membranes are normal. Mucous membranes are not pale, not dry and not cyanotic.  Eyes: Pupils are equal, round, and reactive to  light. Conjunctivae, EOM and lids are normal.  Neck: Trachea normal, normal range of motion and full passive range of motion without pain. Neck supple. No JVD present. No tracheal deviation, no edema and no erythema present. No thyromegaly present.  Cardiovascular: Normal rate, regular rhythm, normal heart sounds, intact distal pulses and normal pulses. Exam reveals no gallop, no distant heart sounds and no friction rub.  No murmur heard. Pulses:      Dorsalis pedis pulses are 2+ on the right side, and 2+ on the left side.  No edema  Pulmonary/Chest: Effort normal and breath sounds normal. No accessory muscle usage. No respiratory distress. She has no decreased breath sounds. She has no wheezes. She has no rhonchi. She has no rales. She exhibits no tenderness.  Abdominal: Soft. Normal appearance and bowel sounds are normal. She exhibits no distension and no ascites. There is no tenderness.  Musculoskeletal: Normal range of motion. She exhibits no edema or tenderness.  Expected osteoarthritis, stiffness; Bilateral Calves soft, supple. Negative Homan's Sign. B- pedal pulses equal; independently mobile with rolling walker  Neurological: She is alert and oriented to person, place, and time. She has normal strength.  Skin: Skin is warm, dry and intact. She is not diaphoretic. No cyanosis. No pallor. Nails show no clubbing.  Psychiatric: She has a normal mood and affect. Her speech is normal and behavior is normal. Judgment and thought content normal. Cognition and memory are normal.  Nursing note and vitals reviewed.   Labs reviewed: Recent Labs    08/26/17 0450 08/30/17 1200 09/02/17 1122  NA 136 137 138  K 4.1 4.1 4.4  CL 110 108 106  CO2 21* 22 24  GLUCOSE 103* 103* 127*  BUN 25* 28* 30*  CREATININE 0.91 1.02* 0.89  CALCIUM 7.4* 8.3* 8.6*   Recent Labs    08/24/17 1200 09/02/17 1122  AST 34 36  ALT 22 24  ALKPHOS 71 111  BILITOT 2.4* 1.3*  PROT 5.1* 6.0*  ALBUMIN 2.4* 2.7*    Recent Labs    08/26/17 0450 08/30/17 1200 09/02/17 1122  WBC 17.3* 16.8* 11.7*  NEUTROABS 11.6* 13.9* 9.4*  HGB 9.3* 10.7* 11.6*  HCT 27.5* 33.0* 35.8  MCV 91.3 96.6 96.8  PLT 319 538* 554*   Lab Results  Component Value Date   TSH 6.520 (H) 09/23/2015   Lab Results  Component Value Date   HGBA1C 5.9 09/23/2015   Lab Results  Component Value Date   CHOL 120 09/19/2015   HDL 59.60 09/19/2015   LDLCALC 48 09/19/2015   LDLDIRECT 60.0 08/23/2016   TRIG 60.0 09/19/2015   CHOLHDL 2 09/19/2015  Significant Diagnostic Results in last 30 days:  No results found.  Assessment/Plan Linda Hart was seen today for medical management of chronic issues.  Diagnoses and all orders for this visit:  Pernicious anemia  Palpitations  Nasal congestion   Above listed conditions stable  Continue current medication regimen  Safety precautions  Fall precautions  Follow up with Cardiology as needed  Monitor for worsening symptoms of nasal congestion  Labs prior to next assessment  Family/ staff Communication:  Total Time:  Documentation:  Face to Face:  Family/Phone:   Labs/tests ordered:  CBC, Met C, Mag+, B12, D, TSH, lipid panel  Medication list reviewed and assessed for continued appropriateness. Monthly medication orders reviewed and signed.  Vikki Ports, NP-C Geriatrics [facility] [phone], [place] [address] Cell Phone (Mon-Fri 8am-5pm):  [phone] On Call:  [phone] & follow prompts after 5pm & weekends Office Phone:  [phone] Office Fax:  [phone]

## 2018-02-22 NOTE — Assessment & Plan Note (Signed)
Stable. Pt continues on Cyanocobalamin 1,000 mcg IM Q month. No recent levels obtained. Will assess labs, etc. Before next assessment.

## 2018-02-26 ENCOUNTER — Encounter
Admission: RE | Admit: 2018-02-26 | Discharge: 2018-02-26 | Disposition: A | Discharge status: Home / Self Care | Payer: Medicare Other | Source: Ambulatory Visit | Attending: Internal Medicine | Admitting: Internal Medicine

## 2018-03-20 ENCOUNTER — Encounter: Payer: Self-pay | Admitting: Gerontology

## 2018-03-20 ENCOUNTER — Non-Acute Institutional Stay: Payer: Medicare Other | Admitting: Gerontology

## 2018-03-20 DIAGNOSIS — R001 Bradycardia, unspecified: Secondary | ICD-10-CM

## 2018-03-20 DIAGNOSIS — R142 Eructation: Secondary | ICD-10-CM

## 2018-03-20 DIAGNOSIS — E782 Mixed hyperlipidemia: Secondary | ICD-10-CM | POA: Diagnosis not present

## 2018-03-20 DIAGNOSIS — H04123 Dry eye syndrome of bilateral lacrimal glands: Secondary | ICD-10-CM

## 2018-03-24 NOTE — Assessment & Plan Note (Signed)
Stable. On Lipitor 20 mg po Q Day. No c/o myalgias

## 2018-03-24 NOTE — Assessment & Plan Note (Signed)
Stable. Heart rate stable, 75-80 bpm. Denies chest pain, shortness of breath. On Coreg 3.125 mg BID

## 2018-03-24 NOTE — Assessment & Plan Note (Signed)
Stable. No daily treatment at this time. No drainage or redness, no irritation

## 2018-03-24 NOTE — Assessment & Plan Note (Signed)
Stable. No recent complaints of worsening symptoms. On Ranitidine 75 mg po BID.

## 2018-03-24 NOTE — Progress Notes (Signed)
Location:    Nursing Home Room Number: 962E Place of Service:  ALF [phone]) Provider:  Toni Arthurs, NP-C  Kirk Ruths, MD  Patient Care Team: Kirk Ruths, MD as PCP - General (Internal Medicine) Minna Merritts, MD as Consulting Physician (Cardiology) Toni Arthurs, NP as Nurse Practitioner [facility])  Extended Emergency Contact Information Primary Emergency Contact: Rodriguez,Katherine Address: [address]          [place], [place] [address] [name] Phone: [phone] Work Phone: [phone] Mobile Phone: [phone] Relation: Daughter Secondary Emergency Contact: Rodriguez,Ray Address: [address]          [address] [place] [address]          [place], [place] [address] [place] Phone: [phone] Relation: Relative  Code Status:  DNR Goals of care: Advanced Directive information Advanced Directives 03/20/2018  Does Patient Have a Medical Advance Directive? Yes  Type of Advance Directive Out of facility DNR (pink MOST or yellow form)  Does patient want to make changes to medical advance directive? No - Patient declined  Copy of [place] in Chart? -  Would patient like information on creating a medical advance directive? -     Chief Complaint  Patient presents with  . Medical Management of Chronic Issues    Routine Visit    HPI:  Pt is a 84 y.o. female seen today for medical management of chronic diseases.    Hyperlipidemia Stable. On Lipitor 20 mg po Q Day. No c/o myalgias  Dry eyes Stable. No daily treatment at this time. No drainage or redness, no irritation  Bradycardia Stable. Heart rate stable, 75-80 bpm. Denies chest pain, shortness of breath. On Coreg 3.125 mg BID  Belching Stable. No recent complaints of worsening symptoms. On Ranitidine 75 mg po BID.     Past Medical History:  Diagnosis Date  . Coronary artery disease 03/06/13   90% prox LAD stenosis s/p DES  .  Difficult intubation   . Dizziness   . GERD (gastroesophageal reflux disease)   . Hypotension   . Ischemic cardiomyopathy 03/09/13   s/p NSTEMI. EF 30-35%.  . MI (myocardial infarction) ([place])   . PAF (paroxysmal atrial fibrillation) ([place]) 03/06/13   In setting of NSTEMI and reperfusion, no recurrence  . SVT (supraventricular tachycardia) ([facility])    Self-limited on outpatient cardiac monitor   Past Surgical History:  Procedure Laterality Date  . CESAREAN SECTION    . CORONARY ANGIOPLASTY WITH STENT PLACEMENT  02/2013   DES-mid LAD, mild LCx, RCA stenoses  . INTRAMEDULLARY (IM) NAIL INTERTROCHANTERIC Right 08/19/2017   Procedure: INTRAMEDULLARY (IM) NAIL INTERTROCHANTRIC;  Surgeon: Hessie Knows, MD;  Location: [facility] ORS;  Service: Orthopedics;  Laterality: Right;    Allergies  Allergen Reactions  . Penicillins     Has patient had a PCN reaction causing immediate rash, facial/tongue/throat swelling, SOB or lightheadedness with hypotension: Unknown Has patient had a PCN reaction causing severe rash involving mucus membranes or skin necrosis: Unknown Has patient had a PCN reaction that required hospitalization No Has patient had a PCN reaction occurring within the last 10 years: No If all of the above answers are "NO", then may proceed with Cephalosporin use.    Allergies as of 03/20/2018      Reactions   Penicillins    Has patient had a PCN reaction causing immediate rash, facial/tongue/throat swelling, SOB or lightheadedness with hypotension: Unknown Has patient had a PCN reaction causing severe  rash involving mucus membranes or skin necrosis: Unknown Has patient had a PCN reaction that required hospitalization No Has patient had a PCN reaction occurring within the last 10 years: No If all of the above answers are "NO", then may proceed with Cephalosporin use.      Medication List        Accurate as of 03/20/18 11:59 PM. Always use your most recent med list.          aspirin EC  81 MG tablet Take 1 tablet (81 mg total) by mouth daily.   atorvastatin 20 MG tablet Commonly known as:  LIPITOR TAKE 1 TABLET BY MOUTH AT BEDTIME   BREO ELLIPTA 100-25 MCG/INH Aepb Generic drug:  fluticasone furoate-vilanterol Inhale 1 puff into the lungs daily.   carvedilol 3.125 MG tablet Commonly known as:  COREG TAKE ONE TABLET TWICE A DAY WITH MEALS.   Cholecalciferol 4000 units Caps Take 1 capsule by mouth every other day.   cyanocobalamin 1000 MCG/ML injection Commonly known as:  (VITAMIN B-12) Inject 1,000 mcg into the muscle every 30 (thirty) days. On 22nd of the month   docusate sodium 100 MG capsule Commonly known as:  COLACE Take 100 mg by mouth 2 (two) times daily as needed.   ENSURE ENLIVE PO Take 1 Bottle by mouth 2 (two) times daily between meals.   ferrous sulfate 325 (65 FE) MG tablet Take 325 mg by mouth every other day. With meals   ipratropium-albuterol 0.5-2.5 (3) MG/3ML Soln Commonly known as:  DUONEB Take 3 mLs every 6 (six) hours as needed by nebulization.   loratadine 10 MG tablet Commonly known as:  CLARITIN Take 1 tablet (10 mg total) by mouth daily as needed for allergies.   mirtazapine 7.5 MG tablet Commonly known as:  REMERON Take 7.5 mg at bedtime by mouth.   nitroGLYCERIN 0.4 MG SL tablet Commonly known as:  NITROSTAT Place 0.4 mg under the tongue every 5 (five) minutes as needed for chest pain.   ranitidine 75 MG tablet Commonly known as:  ZANTAC Take 75 mg 2 (two) times daily by mouth.   spironolactone 25 MG tablet Commonly known as:  ALDACTONE Take 12.5 mg by mouth daily.   vitamin C 1000 MG tablet Take 1,000 mg by mouth daily.       Review of Systems  Constitutional: Negative for activity change, appetite change, chills, diaphoresis and fever.  HENT: Negative for congestion, mouth sores, nosebleeds, postnasal drip, sneezing, sore throat, trouble swallowing and voice change.   Respiratory: Negative for apnea, cough,  choking, chest tightness, shortness of breath and wheezing.   Cardiovascular: Negative for chest pain, palpitations and leg swelling.  Gastrointestinal: Negative for abdominal distention, abdominal pain, constipation, diarrhea and nausea.  Genitourinary: Negative for difficulty urinating, dysuria, frequency and urgency.  Musculoskeletal: Positive for gait problem. Negative for back pain and myalgias. Arthralgias: typical arthritis.  Skin: Negative for color change, pallor, rash and wound.  Neurological: Positive for weakness. Negative for dizziness, tremors, syncope, speech difficulty, numbness and headaches.  Psychiatric/Behavioral: Negative for agitation and behavioral problems.  All other systems reviewed and are negative.   Immunization History  Administered Date(s) Administered  . Influenza-Unspecified 07/30/2014, 07/17/2017  . Pneumococcal Conjugate-13 08/10/2014   Pertinent  Health Maintenance Due  Topic Date Due  . PNA vac Low Risk Adult (2 of 2 - PPSV23) 08/11/2015  . INFLUENZA VACCINE  05/29/2018  . DEXA SCAN  Completed   Fall Risk  11/26/2016  Falls in  the past year? No   Functional Status Survey:    Vitals:   03/20/18 1102  BP: (!) 164/55  Pulse: 77  Resp: 18  Temp: (!) 96.6 F (35.9 C)  TempSrc: Oral  SpO2: 98%  Weight: 94 lb 12.8 oz (43 kg)  Height: 5' (1.524 m)   Body mass index is 18.51 kg/m. Physical Exam  Constitutional: She is oriented to person, place, and time. Vital signs are normal. She appears well-developed and well-nourished. She is active and cooperative. She does not appear ill. No distress.  HENT:  Head: Normocephalic and atraumatic.  Mouth/Throat: Uvula is midline, oropharynx is clear and moist and mucous membranes are normal. Mucous membranes are not pale, not dry and not cyanotic.  Eyes: Pupils are equal, round, and reactive to light. Conjunctivae, EOM and lids are normal.  Neck: Trachea normal, normal range of motion and full passive  range of motion without pain. Neck supple. No JVD present. No tracheal deviation, no edema and no erythema present. No thyromegaly present.  Cardiovascular: Normal rate, regular rhythm, normal heart sounds, intact distal pulses and normal pulses. Exam reveals no gallop, no distant heart sounds and no friction rub.  No murmur heard. Pulses:      Dorsalis pedis pulses are 2+ on the right side, and 2+ on the left side.  No edema  Pulmonary/Chest: Effort normal and breath sounds normal. No accessory muscle usage. No respiratory distress. She has no decreased breath sounds. She has no wheezes. She has no rhonchi. She has no rales. She exhibits no tenderness.  Abdominal: Soft. Normal appearance and bowel sounds are normal. She exhibits no distension and no ascites. There is no tenderness.  Musculoskeletal: Normal range of motion. She exhibits no edema or tenderness.  Expected osteoarthritis, stiffness; Bilateral Calves soft, supple. Negative Homan's Sign. B- pedal pulses equal; mobile with use of Rolator   Neurological: She is alert and oriented to person, place, and time. She has normal strength.  Skin: Skin is warm, dry and intact. She is not diaphoretic. No cyanosis. No pallor. Nails show no clubbing.  Psychiatric: She has a normal mood and affect. Her speech is normal and behavior is normal. Judgment and thought content normal. Cognition and memory are normal.  Nursing note and vitals reviewed.   Labs reviewed: Recent Labs    08/26/17 0450 08/30/17 1200 09/02/17 1122  NA 136 137 138  K 4.1 4.1 4.4  CL 110 108 106  CO2 21* 22 24  GLUCOSE 103* 103* 127*  BUN 25* 28* 30*  CREATININE 0.91 1.02* 0.89  CALCIUM 7.4* 8.3* 8.6*   Recent Labs    08/24/17 1200 09/02/17 1122  AST 34 36  ALT 22 24  ALKPHOS 71 111  BILITOT 2.4* 1.3*  PROT 5.1* 6.0*  ALBUMIN 2.4* 2.7*   Recent Labs    08/26/17 0450 08/30/17 1200 09/02/17 1122  WBC 17.3* 16.8* 11.7*  NEUTROABS 11.6* 13.9* 9.4*  HGB  9.3* 10.7* 11.6*  HCT 27.5* 33.0* 35.8  MCV 91.3 96.6 96.8  PLT 319 538* 554*   Lab Results  Component Value Date   TSH 6.520 (H) 09/23/2015   Lab Results  Component Value Date   HGBA1C 5.9 09/23/2015   Lab Results  Component Value Date   CHOL 120 09/19/2015   HDL 59.60 09/19/2015   LDLCALC 48 09/19/2015   LDLDIRECT 60.0 08/23/2016   TRIG 60.0 09/19/2015   CHOLHDL 2 09/19/2015    Significant Diagnostic Results in last 30 days:  No results found.  Assessment/Plan Chihiro was seen today for medical management of chronic issues.  Diagnoses and all orders for this visit:  Mixed hyperlipidemia  Dry eyes  Bradycardia  Belching   Above listed conditions stable  Continue current medication regimen  Monitor for myalgias and/or muscle weakness  Monitor for bradycardia  Monitor for eye irritation  Safety precautions  Fall precautions  Labs next week  Family/ staff Communication:   Total Time:  Documentation:  Face to Face:  Family/Phone:   Labs/tests ordered:  Cbc, met c, mag, tsh, B12, D, lipid panel  Medication list reviewed and assessed for continued appropriateness. Monthly medication orders reviewed and signed.  Vikki Ports, NP-C Geriatrics [facility] [phone], [place] [address] Cell Phone (Mon-Fri 8am-5pm):  [phone] On Call:  [phone] & follow prompts after 5pm & weekends Office Phone:  [phone] Office Fax:  [phone]

## 2018-03-29 ENCOUNTER — Encounter
Admission: RE | Admit: 2018-03-29 | Discharge: 2018-03-29 | Disposition: A | Discharge status: Home / Self Care | Payer: Medicare Other | Source: Ambulatory Visit | Attending: Internal Medicine | Admitting: Internal Medicine

## 2018-04-23 ENCOUNTER — Encounter: Payer: Self-pay | Admitting: Gerontology

## 2018-04-28 ENCOUNTER — Encounter
Admission: RE | Admit: 2018-04-28 | Discharge: 2018-04-28 | Disposition: A | Discharge status: Home / Self Care | Payer: Medicare Other | Source: Ambulatory Visit | Attending: Internal Medicine | Admitting: Internal Medicine

## 2018-05-05 NOTE — Progress Notes (Signed)
Opened in error; Disregard.

## 2018-05-12 ENCOUNTER — Non-Acute Institutional Stay: Payer: Medicare Other | Admitting: Adult Health

## 2018-05-12 ENCOUNTER — Encounter: Payer: Self-pay | Admitting: Adult Health

## 2018-05-12 DIAGNOSIS — R630 Anorexia: Secondary | ICD-10-CM

## 2018-05-12 DIAGNOSIS — I480 Paroxysmal atrial fibrillation: Secondary | ICD-10-CM

## 2018-05-12 DIAGNOSIS — L932 Other local lupus erythematosus: Secondary | ICD-10-CM | POA: Diagnosis not present

## 2018-05-12 DIAGNOSIS — E782 Mixed hyperlipidemia: Secondary | ICD-10-CM

## 2018-05-12 DIAGNOSIS — K219 Gastro-esophageal reflux disease without esophagitis: Secondary | ICD-10-CM | POA: Diagnosis not present

## 2018-05-12 DIAGNOSIS — I5042 Chronic combined systolic (congestive) and diastolic (congestive) heart failure: Secondary | ICD-10-CM

## 2018-05-12 NOTE — Progress Notes (Signed)
Location:   [facility] Room Number: [phone] Place of Service:  ALF (13)   CODE STATUS: DNR  Allergies  Allergen Reactions  . Penicillins     Has patient had a PCN reaction causing immediate rash, facial/tongue/throat swelling, SOB or lightheadedness with hypotension: Unknown Has patient had a PCN reaction causing severe rash involving mucus membranes or skin necrosis: Unknown Has patient had a PCN reaction that required hospitalization No Has patient had a PCN reaction occurring within the last 10 years: No If all of the above answers are "NO", then may proceed with Cephalosporin use.    Chief Complaint  Patient presents with  . Medical Management of Chronic Issues    Heart failure; paf; hyperlipidemia     HPI:  She is a 84 year old long term resident of this facility being seen for the management of her chronic illnesses: heart failure; paf; hyperlipidemia. She denies any chest pain or shortness of breath; no swelling of feet; no change in appetite. There are no nursing concerns at this time.   Past Medical History:  Diagnosis Date  . Coronary artery disease 03/06/13   90% prox LAD stenosis s/p DES  . Difficult intubation   . Dizziness   . GERD (gastroesophageal reflux disease)   . Hypotension   . Ischemic cardiomyopathy 03/09/13   s/p NSTEMI. EF 30-35%.  . MI (myocardial infarction) ([place])   . PAF (paroxysmal atrial fibrillation) ([place]) 03/06/13   In setting of NSTEMI and reperfusion, no recurrence  . SVT (supraventricular tachycardia) ([facility])    Self-limited on outpatient cardiac monitor    Past Surgical History:  Procedure Laterality Date  . CESAREAN SECTION    . CORONARY ANGIOPLASTY WITH STENT PLACEMENT  02/2013   DES-mid LAD, mild LCx, RCA stenoses  . INTRAMEDULLARY (IM) NAIL INTERTROCHANTERIC Right 08/19/2017   Procedure: INTRAMEDULLARY (IM) NAIL INTERTROCHANTRIC;  Surgeon: Hessie Knows, MD;  Location: [facility] ORS;  Service: Orthopedics;   Laterality: Right;    Social History   Socioeconomic History  . Marital status: Widowed    Spouse name: Not on file  . Number of children: 3  . Years of education: Not on file  . Highest education level: Not on file  Occupational History  . Not on file  Social Needs  . Financial resource strain: Not on file  . Food insecurity:    Worry: Not on file    Inability: Not on file  . Transportation needs:    Medical: Not on file    Non-medical: Not on file  Tobacco Use  . Smoking status: Never Smoker  . Smokeless tobacco: Never Used  Substance and Sexual Activity  . Alcohol use: No    Comment: occasional  . Drug use: No  . Sexual activity: Never  Lifestyle  . Physical activity:    Days per week: Not on file    Minutes per session: Not on file  . Stress: Not on file  Relationships  . Social connections:    Talks on phone: Not on file    Gets together: Not on file    Attends religious service: Not on file    Active member of club or organization: Not on file    Attends meetings of clubs or organizations: Not on file    Relationship status: Not on file  . Intimate partner violence:    Fear of current or ex partner: Not on file    Emotionally abused: Not on file  Physically abused: Not on file    Forced sexual activity: Not on file  Other Topics Concern  . Not on file  Social History Narrative   Resident at [employer]. Does not regularly exercise.    DNR   Denies alcohol use   Never smoker   3 children   Family History  Problem Relation Age of Onset  . Heart failure Mother   . Heart failure Father   . Heart attack Father       VITAL SIGNS BP 136/66   Pulse 80   Temp (!) 96.6 F (35.9 C) (Oral)   Resp 16   Ht 5' (1.524 m)   Wt 97 lb 4.8 oz (44.1 kg)   SpO2 98%   BMI 19.00 kg/m   Outpatient Encounter Medications as of 05/12/2018  Medication Sig  . Ascorbic Acid (VITAMIN C) 1000 MG tablet Take 1,000 mg by mouth daily.  Marland Kitchen aspirin EC 81 MG tablet Take 1  tablet (81 mg total) by mouth daily.  Marland Kitchen atorvastatin (LIPITOR) 20 MG tablet TAKE 1 TABLET BY MOUTH AT BEDTIME  . carvedilol (COREG) 3.125 MG tablet TAKE ONE TABLET TWICE A DAY WITH MEALS.  Marland Kitchen Cholecalciferol 4000 units CAPS Take 1 capsule by mouth every other day.  . cyanocobalamin (,VITAMIN B-12,) 1000 MCG/ML injection Inject 1,000 mcg into the muscle every 30 (thirty) days. On 22nd of the month  . docusate sodium (COLACE) 100 MG capsule Take 100 mg by mouth 2 (two) times daily as needed.  . ferrous sulfate 325 (65 FE) MG tablet Take 325 mg by mouth every other day. With meals  . fluticasone furoate-vilanterol (BREO ELLIPTA) 100-25 MCG/INH AEPB Inhale 1 puff into the lungs daily.   Marland Kitchen ipratropium-albuterol (DUONEB) 0.5-2.5 (3) MG/3ML SOLN Take 3 mLs every 6 (six) hours as needed by nebulization.   Marland Kitchen loratadine (CLARITIN) 10 MG tablet Take 1 tablet (10 mg total) by mouth daily as needed for allergies.  . mirtazapine (REMERON) 7.5 MG tablet Take 7.5 mg at bedtime by mouth.  . nitroGLYCERIN (NITROSTAT) 0.4 MG SL tablet Place 0.4 mg under the tongue every 5 (five) minutes as needed for chest pain.  . Nutritional Supplements (ENSURE ENLIVE PO) Take 1 Bottle by mouth 2 (two) times daily between meals.  . ranitidine (ZANTAC) 75 MG tablet Take 75 mg 2 (two) times daily by mouth.   . spironolactone (ALDACTONE) 25 MG tablet Take 12.5 mg by mouth daily.   No facility-administered encounter medications on file as of 05/12/2018.      SIGNIFICANT DIAGNOSTIC EXAMS  LABS REVIEWED:   09-02-17: wbc 11.7; hgb 11.6; hct 35.8; mcv 96.8; plt 554; glucose 127; bun 30; creat 0.89; k+ 4.4; na++ 138; ca 8.6; total bili 1.3  albumin 2.7  Review of Systems  Constitutional: Negative for malaise/fatigue.  Respiratory: Negative for cough and shortness of breath.   Cardiovascular: Negative for chest pain, palpitations and leg swelling.  Gastrointestinal: Negative for abdominal pain, constipation and heartburn.    Musculoskeletal: Negative for back pain, joint pain and myalgias.  Skin: Negative.   Neurological: Negative for dizziness.  Psychiatric/Behavioral: The patient is not nervous/anxious.      Physical Exam  Constitutional: She is oriented to person, place, and time. She appears well-developed and well-nourished.  Frail   Neck: No thyromegaly present.  Cardiovascular: Normal rate, regular rhythm, normal heart sounds and intact distal pulses.  Pulmonary/Chest: Effort normal and breath sounds normal. No respiratory distress.  Abdominal: Soft. Bowel sounds are normal.  She exhibits no distension. There is no tenderness.  Musculoskeletal: Normal range of motion. She exhibits no edema.  Lymphadenopathy:    She has no cervical adenopathy.  Neurological: She is alert and oriented to person, place, and time.  Skin: Skin is warm and dry.  Psychiatric: She has a normal mood and affect.      ASSESSMENT/ PLAN:  TODAY;   1.  Chronic combined systolic and diastolic heart failure: is stable will continue coreg 3.125 mg twice daily aldactone 12.5 mg daily   2.  PAF( paroxysmal atrial fibrillation): heart rate stable: will continue asa 81 mg daily and coreg 3.125 mg twice daily  3. CAD in native artery: is status post NSTEMI and cardiac stent:  is stable will continue asa 81 mg daily and has prn ntg/   4.  Pernicious anemia: is stable hgb 11.7; will continue iron every other day; and vit B 12 injection monthly   5. COPD: is stable will continue breo daily; and has claritin 10 mg daily as needed   6. Mixed hyperlipidemia: is stable will continue lipitor 20 mg daily   7. GERD; without esophagitis: is stable will continue zantac 75 mg twice daily   8. Anorexia: is stable weight is 97 pounds will continue remeron 7.5 mg nightly   9. Cutaneous lupus erythematosus: is without change in status will not make changes will monitor  Will check cbc; cmp; lipids   MD is aware of resident's narcotic use  and is in agreement with current plan of care. We will attempt to wean resident as apropriate   Ok Edwards NP [facility]  Contact [phone] Monday through Friday 8am- 5pm  After hours call [phone]

## 2018-05-15 ENCOUNTER — Other Ambulatory Visit
Admission: RE | Admit: 2018-05-15 | Discharge: 2018-05-15 | Disposition: A | Discharge status: Home / Self Care | Payer: Medicare Other | Source: Ambulatory Visit | Attending: Internal Medicine | Admitting: Internal Medicine

## 2018-05-15 DIAGNOSIS — D510 Vitamin B12 deficiency anemia due to intrinsic factor deficiency: Secondary | ICD-10-CM | POA: Insufficient documentation

## 2018-05-15 DIAGNOSIS — E785 Hyperlipidemia, unspecified: Secondary | ICD-10-CM | POA: Diagnosis not present

## 2018-05-15 DIAGNOSIS — Z0000 Encounter for general adult medical examination without abnormal findings: Secondary | ICD-10-CM | POA: Diagnosis not present

## 2018-05-15 LAB — COMPREHENSIVE METABOLIC PANEL
ALBUMIN: 3.2 g/dL — AB (ref 3.5–5.0)
ALT: 36 U/L (ref 0–44)
ANION GAP: 7 (ref 5–15)
AST: 39 U/L (ref 15–41)
Alkaline Phosphatase: 87 U/L (ref 38–126)
BUN: 53 mg/dL — AB (ref 8–23)
CO2: 25 mmol/L (ref 22–32)
Calcium: 8.6 mg/dL — ABNORMAL LOW (ref 8.9–10.3)
Chloride: 107 mmol/L (ref 98–111)
Creatinine, Ser: 1.19 mg/dL — ABNORMAL HIGH (ref 0.44–1.00)
GFR calc non Af Amer: 39 mL/min — ABNORMAL LOW (ref >60)
GFR, EST AFRICAN AMERICAN: 45 mL/min — AB (ref >60)
GLUCOSE: 87 mg/dL (ref 70–99)
POTASSIUM: 4.6 mmol/L (ref 3.5–5.1)
SODIUM: 139 mmol/L (ref 135–145)
Total Bilirubin: 1 mg/dL (ref 0.3–1.2)
Total Protein: 6 g/dL — ABNORMAL LOW (ref 6.5–8.1)

## 2018-05-15 LAB — CBC WITH DIFFERENTIAL/PLATELET
BASOS ABS: 0 10*3/uL (ref 0–0.1)
Basophils Relative: 1 %
Eosinophils Absolute: 0.4 10*3/uL (ref 0–0.7)
Eosinophils Relative: 4 %
HEMATOCRIT: 36.4 % (ref 35.0–47.0)
HEMOGLOBIN: 12.1 g/dL (ref 12.0–16.0)
LYMPHS ABS: 2.3 10*3/uL (ref 1.0–3.6)
LYMPHS PCT: 25 %
MCH: 32.1 pg (ref 26.0–34.0)
MCHC: 33.3 g/dL (ref 32.0–36.0)
MCV: 96.5 fL (ref 80.0–100.0)
Monocytes Absolute: 1 10*3/uL — ABNORMAL HIGH (ref 0.2–0.9)
Monocytes Relative: 11 %
NEUTROS ABS: 5.4 10*3/uL (ref 1.4–6.5)
Neutrophils Relative %: 59 %
Platelets: 213 10*3/uL (ref 150–440)
RBC: 3.77 MIL/uL — AB (ref 3.80–5.20)
RDW: 12.4 % (ref 11.5–14.5)
WBC: 9.1 10*3/uL (ref 3.6–11.0)

## 2018-05-15 LAB — LIPID PANEL
CHOL/HDL RATIO: 1.9 ratio
Cholesterol: 112 mg/dL (ref 0–200)
HDL: 60 mg/dL (ref >40)
LDL Cholesterol: 46 mg/dL (ref 0–99)
Triglycerides: 31 mg/dL (ref <150)
VLDL: 6 mg/dL (ref 0–40)

## 2018-05-29 ENCOUNTER — Encounter
Admission: RE | Admit: 2018-05-29 | Discharge: 2018-05-29 | Disposition: A | Discharge status: Home / Self Care | Payer: Medicare Other | Source: Ambulatory Visit | Attending: Internal Medicine | Admitting: Internal Medicine

## 2018-06-11 DIAGNOSIS — I25119 Atherosclerotic heart disease of native coronary artery with unspecified angina pectoris: Secondary | ICD-10-CM | POA: Diagnosis not present

## 2018-06-11 DIAGNOSIS — I255 Ischemic cardiomyopathy: Secondary | ICD-10-CM | POA: Diagnosis not present

## 2018-06-11 DIAGNOSIS — Z0000 Encounter for general adult medical examination without abnormal findings: Secondary | ICD-10-CM | POA: Diagnosis not present

## 2018-06-11 DIAGNOSIS — E441 Mild protein-calorie malnutrition: Secondary | ICD-10-CM | POA: Diagnosis not present

## 2018-06-11 DIAGNOSIS — Z593 Problems related to living in residential institution: Secondary | ICD-10-CM | POA: Diagnosis not present

## 2018-06-11 DIAGNOSIS — G3184 Mild cognitive impairment, so stated: Secondary | ICD-10-CM | POA: Diagnosis not present

## 2018-06-20 NOTE — Progress Notes (Signed)
Cardiology Office Note  Date:  06/24/2018   ID:  Oneka, Parada 1925-02-17, MRN [id number]  PCP:  Kirk Ruths, MD   Chief Complaint  Patient presents with  . other    6 month f/u no complaints today. Meds reviewed verbally with pt.    HPI:  Ms. Ericsson is a very pleasant 84 year old woman with PMHx s/f CAD (NSTEMI 03/06/13 s/p DES-LAD),  PAF (in setting of NSTEMI and reperfusion), not on anticoagulation given frequent falls ischemic cardiomyopathy (EF 30-35% on 03/09/13 echo),  SVT (self-limited on OP monitor),  GERD, remote h/o lightheadedness and syncope who was re-admitted to [facility] with worsening DOE/SOB may 2014.   She lives at the [place] She presents today for follow-up of her coronary artery disease  In follow-up today she reports that she is doing well ecords reviewed from [place] Blood pressure stable 387 up to 564 systolic Weight stable Pulse within good range Denies any side effects from medications Walking with a walker, no recent falls Denies any palpitations concerning for atrial fibrillation No significant leg swelling  Lab work reviewed Total chol 112 HCT 36 CR 1.19  EKG personally reviewed by myself on todays visit Shows normal sinus rhythm with rate 69 bpm no significant ST or T-wave changes  Previous records reviewed Recent fall,  hip fx 07/2017 Required surgery Subsequent Anemia UTI, hypotension in the hospital treated with IV antibiotics then Keflex Slow recovery in rehab per the daughter Now living in assisted living [place] Reports that she is doing much better, ambulating with walker with no assistance Lost 10 pounds during October, trying to get this weight back  Had severe leg swelling  during hospitalization this has slowly recovered Wears compression hose Anemia resolved Aspirin and brilinta were held at discharge and not restarted  Prior history of falls Fall 2 x, kitchen in MAY  2018, And getting gout of the bath tub the same week   She was admitted 03/05/13 with NSTEMI.  cardiac catheterization revealing tubular 90% prox extending to mid LAD stenosis s/p DES, mild atheresclerosis in LCx, RCA.  Post-cath echo showed EF 30-35%, severe anterior, septal and apical HK, mild TR/MR, mildly elevated EF described as reduced. DAPT- ASA/Brilinta x 12 months recommended.  Cr did bump to 1.5-1.6 suspected to be secondary to contrast induced precluding the addition of ACEi/ARB. Limited repeat echo indicated LVEF 30-35%. She declined LifeVest.   After discharge from the hospital, creatinine was >2, Lasix was held. Creatinine in June 2014 improved to 1.5.  Previous event monitor showed frequent supraventricular ectopy, rare PVCs.  She had short runs of SVT, the longest was 11 beats. She had 46 short runs. Uncertain if she was symptomatic during these short runs of SVT  Echocardiogram may 2014 with ejection fraction 30-35%, mild to moderate pulmonary hypertension  PMH:   has a past medical history of Coronary artery disease (03/06/13), Difficult intubation, Dizziness, GERD (gastroesophageal reflux disease), Hypotension, Ischemic cardiomyopathy (03/09/13), MI (myocardial infarction) ([place]), PAF (paroxysmal atrial fibrillation) ([place]) (03/06/13), and SVT (supraventricular tachycardia) ([place]).  PSH:    Past Surgical History:  Procedure Laterality Date  . CESAREAN SECTION    . CORONARY ANGIOPLASTY WITH STENT PLACEMENT  02/2013   DES-mid LAD, mild LCx, RCA stenoses  . INTRAMEDULLARY (IM) NAIL INTERTROCHANTERIC Right 08/19/2017   Procedure: INTRAMEDULLARY (IM) NAIL INTERTROCHANTRIC;  Surgeon: Hessie Knows, MD;  Location: [facility] ORS;  Service: Orthopedics;  Laterality: Right;    Current Outpatient Medications  Medication  Sig Dispense Refill  . Ascorbic Acid (VITAMIN C) 1000 MG tablet Take 1,000 mg by mouth daily.    Marland Kitchen aspirin EC 81 MG tablet Take 1 tablet (81 mg total) by mouth  daily. 90 tablet 3  . atorvastatin (LIPITOR) 20 MG tablet TAKE 1 TABLET BY MOUTH AT BEDTIME 90 tablet 1  . carvedilol (COREG) 3.125 MG tablet TAKE ONE TABLET TWICE A DAY WITH MEALS. 60 tablet 11  . Cholecalciferol 4000 units CAPS Take 1 capsule by mouth every other day.    . cyanocobalamin (,VITAMIN B-12,) 1000 MCG/ML injection Inject 1,000 mcg into the muscle every 30 (thirty) days. On 22nd of the month    . docusate sodium (COLACE) 100 MG capsule Take 100 mg by mouth 2 (two) times daily as needed.    . ferrous sulfate 325 (65 FE) MG tablet Take 325 mg by mouth every other day. With meals    . fluticasone furoate-vilanterol (BREO ELLIPTA) 100-25 MCG/INH AEPB Inhale 1 puff into the lungs daily.     Marland Kitchen ipratropium-albuterol (DUONEB) 0.5-2.5 (3) MG/3ML SOLN Take 3 mLs every 6 (six) hours as needed by nebulization.     Marland Kitchen loratadine (CLARITIN) 10 MG tablet Take 1 tablet (10 mg total) by mouth daily as needed for allergies. 30 tablet 11  . mirtazapine (REMERON) 7.5 MG tablet Take 7.5 mg at bedtime by mouth.    . nitroGLYCERIN (NITROSTAT) 0.4 MG SL tablet Place 0.4 mg under the tongue every 5 (five) minutes as needed for chest pain.    . Nutritional Supplements (ENSURE ENLIVE PO) Take 1 Bottle by mouth 2 (two) times daily between meals.    . ranitidine (ZANTAC) 75 MG tablet Take 75 mg 2 (two) times daily by mouth.     . spironolactone (ALDACTONE) 25 MG tablet Take 12.5 mg by mouth daily.     No current facility-administered medications for this visit.      Allergies:   Penicillins   Social History:  The patient  reports that she has never smoked. She has never used smokeless tobacco. She reports that she does not drink alcohol or use drugs.   Family History:   family history includes Heart attack in her father; Heart failure in her father and mother.    Review of Systems: Review of Systems  Constitutional: Negative.   Respiratory: Negative.   Cardiovascular: Negative.   Gastrointestinal:  Negative.   Musculoskeletal:       Leg weakness  Neurological: Negative.   Psychiatric/Behavioral: Negative.   All other systems reviewed and are negative.    PHYSICAL EXAM: VS:  BP (!) 154/60 (BP Location: Left Arm, Patient Position: Sitting, Cuff Size: Normal)   Pulse 69   Ht 4\' 11"  (1.499 m)   Wt 96 lb (43.5 kg)   BMI 19.39 kg/m  , BMI Body mass index is 19.39 kg/m.  No significant change Constitutional:  oriented to person, place, and time. No distress. Walks with a walker Thin HENT:  Head: Normocephalic and atraumatic.  Eyes:  no discharge. No scleral icterus.  Neck: Normal range of motion. Neck supple. No JVD present.  Cardiovascular: Normal rate, regular rhythm, ectopy noted , normal heart sounds and intact distal pulses. Exam reveals no gallop and no friction rub. No edema No murmur heard. Pulmonary/Chest: Effort normal and breath sounds normal. No stridor. No respiratory distress.  no wheezes.  no rales.  no tenderness.  Abdominal: Soft.  no distension.  no tenderness.  Musculoskeletal: Normal range of motion.  no  tenderness or deformity.  Neurological:  normal muscle tone. Coordination normal. No atrophy Skin: Skin is warm and dry. No rash noted. not diaphoretic.  Psychiatric:  normal mood and affect. behavior is normal. Thought content normal.   Recent Labs: 05/15/2018: ALT 36; BUN 53; Creatinine, Ser 1.19; Hemoglobin 12.1; Platelets 213; Potassium 4.6; Sodium 139    Lipid Panel Lab Results  Component Value Date   CHOL 112 05/15/2018   HDL 60 05/15/2018   LDLCALC 46 05/15/2018   TRIG 31 05/15/2018      Wt Readings from Last 3 Encounters:  06/24/18 96 lb (43.5 kg)  05/12/18 97 lb 4.8 oz (44.1 kg)  03/20/18 94 lb 12.8 oz (43 kg)       ASSESSMENT AND PLAN:   Paroxysmal atrial fibrillation (HCC) - Plan: EKG 12-Lead Maintaining normal sinus rhythm, currently not on anticoagulation secondary to high risk of falls  prior trauma to her legs .  Walks with  a walker  Previous fall with hip fracture stable  Essential hypertension Blood pressure is well controlled on today's visit. No changes made to the medications. stable  Cardiomyopathy, ischemic Previous echocardiogram several years ago ejection fraction 40-45% In November 2016  No further testing at this time currently not on Lasix Appears euvolemic  Mixed hyperlipidemia Cholesterol is at goal on the current lipid regimen. No changes to the medications were made.  Stable  CAD in native artery On aspirin 81 mg daily   Systolic and diastolic CHF, acute on chronic (HCC) Appears relatively euvolemic  No changes to her medications.  stable  Anorexia Weight has been stable    Total encounter time more than 25 minutes  Greater than 50% was spent in counseling and coordination of care with the patient   Disposition:   F/U  6 months   Orders Placed This Encounter  Procedures  . EKG 12-Lead     Signed, Esmond Plants, M.D., Ph.D. 06/24/2018  [place], [place]

## 2018-06-24 ENCOUNTER — Encounter: Payer: Self-pay | Admitting: Cardiovascular Disease

## 2018-06-24 ENCOUNTER — Ambulatory Visit (INDEPENDENT_AMBULATORY_CARE_PROVIDER_SITE_OTHER): Payer: Medicare Other | Admitting: Cardiovascular Disease

## 2018-06-24 VITALS — BP 154/60 | HR 69 | Ht 59.0 in | Wt 96.0 lb

## 2018-06-24 DIAGNOSIS — I480 Paroxysmal atrial fibrillation: Secondary | ICD-10-CM

## 2018-06-24 DIAGNOSIS — I255 Ischemic cardiomyopathy: Secondary | ICD-10-CM | POA: Diagnosis not present

## 2018-06-24 DIAGNOSIS — I471 Supraventricular tachycardia: Secondary | ICD-10-CM | POA: Diagnosis not present

## 2018-06-24 DIAGNOSIS — I5042 Chronic combined systolic (congestive) and diastolic (congestive) heart failure: Secondary | ICD-10-CM | POA: Diagnosis not present

## 2018-06-24 DIAGNOSIS — I2510 Atherosclerotic heart disease of native coronary artery without angina pectoris: Secondary | ICD-10-CM

## 2018-06-24 NOTE — Patient Instructions (Signed)

## 2018-06-29 ENCOUNTER — Encounter
Admission: RE | Admit: 2018-06-29 | Discharge: 2018-06-29 | Disposition: A | Discharge status: Home / Self Care | Payer: Medicare Other | Source: Ambulatory Visit | Attending: Internal Medicine | Admitting: Internal Medicine

## 2018-07-10 ENCOUNTER — Encounter: Payer: Self-pay | Admitting: Adult Health

## 2018-07-10 ENCOUNTER — Non-Acute Institutional Stay: Payer: Medicare Other | Admitting: Adult Health

## 2018-07-10 DIAGNOSIS — I480 Paroxysmal atrial fibrillation: Secondary | ICD-10-CM

## 2018-07-10 DIAGNOSIS — I2510 Atherosclerotic heart disease of native coronary artery without angina pectoris: Secondary | ICD-10-CM | POA: Diagnosis not present

## 2018-07-10 DIAGNOSIS — D510 Vitamin B12 deficiency anemia due to intrinsic factor deficiency: Secondary | ICD-10-CM | POA: Diagnosis not present

## 2018-07-10 DIAGNOSIS — I5042 Chronic combined systolic (congestive) and diastolic (congestive) heart failure: Secondary | ICD-10-CM

## 2018-07-10 NOTE — Progress Notes (Signed)
Location:   [facility] [place] Room Number: [phone] Place of Service:  ALF (13)   CODE STATUS: DNR  Allergies  Allergen Reactions  . Penicillins     Has patient had a PCN reaction causing immediate rash, facial/tongue/throat swelling, SOB or lightheadedness with hypotension: Unknown Has patient had a PCN reaction causing severe rash involving mucus membranes or skin necrosis: Unknown Has patient had a PCN reaction that required hospitalization No Has patient had a PCN reaction occurring within the last 10 years: No If all of the above answers are "NO", then may proceed with Cephalosporin use.    Chief Complaint  Patient presents with  . Medical Management of Chronic Issues    Heart failure; cad; paf; anemia.     HPI:  She is a long term resident of assisted living being seen for the management of her chronic illnesses: heart failure; cad; paf; anemia. She denies any chest pain; no leg swelling; no changes in appetite. There are no nursing concerns at this time.   Past Medical History:  Diagnosis Date  . Coronary artery disease 03/06/13   90% prox LAD stenosis s/p DES  . Difficult intubation   . Dizziness   . GERD (gastroesophageal reflux disease)   . Hypotension   . Ischemic cardiomyopathy 03/09/13   s/p NSTEMI. EF 30-35%.  . MI (myocardial infarction) ([place])   . PAF (paroxysmal atrial fibrillation) ([place]) 03/06/13   In setting of NSTEMI and reperfusion, no recurrence  . SVT (supraventricular tachycardia) (HCC)    Self-limited on outpatient cardiac monitor    Past Surgical History:  Procedure Laterality Date  . CESAREAN SECTION    . CORONARY ANGIOPLASTY WITH STENT PLACEMENT  02/2013   DES-mid LAD, mild LCx, RCA stenoses  . INTRAMEDULLARY (IM) NAIL INTERTROCHANTERIC Right 08/19/2017   Procedure: INTRAMEDULLARY (IM) NAIL INTERTROCHANTRIC;  Surgeon: Hessie Knows, MD;  Location: [facility] ORS;  Service: Orthopedics;  Laterality: Right;    Social History    Socioeconomic History  . Marital status: Widowed    Spouse name: Not on file  . Number of children: 3  . Years of education: Not on file  . Highest education level: Not on file  Occupational History  . Not on file  Social Needs  . Financial resource strain: Not on file  . Food insecurity:    Worry: Not on file    Inability: Not on file  . Transportation needs:    Medical: Not on file    Non-medical: Not on file  Tobacco Use  . Smoking status: Never Smoker  . Smokeless tobacco: Never Used  Substance and Sexual Activity  . Alcohol use: No    Comment: occasional  . Drug use: No  . Sexual activity: Never  Lifestyle  . Physical activity:    Days per week: Not on file    Minutes per session: Not on file  . Stress: Not on file  Relationships  . Social connections:    Talks on phone: Not on file    Gets together: Not on file    Attends religious service: Not on file    Active member of club or organization: Not on file    Attends meetings of clubs or organizations: Not on file    Relationship status: Not on file  . Intimate partner violence:    Fear of current or ex partner: Not on file    Emotionally abused: Not on file    Physically abused: Not on file  Forced sexual activity: Not on file  Other Topics Concern  . Not on file  Social History Narrative   Resident at [employer]. Does not regularly exercise.    DNR   Denies alcohol use   Never smoker   3 children   Family History  Problem Relation Age of Onset  . Heart failure Mother   . Heart failure Father   . Heart attack Father       VITAL SIGNS BP (!) 142/48   Pulse 72   Temp 97.6 F (36.4 C) (Oral)   Resp 18   Ht 4\' 11"  (1.499 m)   Wt 96 lb 4.8 oz (43.7 kg)   SpO2 100%   BMI 19.45 kg/m   Outpatient Encounter Medications as of 07/10/2018  Medication Sig  . Ascorbic Acid (VITAMIN C) 1000 MG tablet Take 1,000 mg by mouth daily.  Marland Kitchen aspirin EC 81 MG tablet Take 1 tablet (81 mg total) by mouth  daily.  Marland Kitchen atorvastatin (LIPITOR) 20 MG tablet TAKE 1 TABLET BY MOUTH AT BEDTIME  . carvedilol (COREG) 3.125 MG tablet TAKE ONE TABLET TWICE A DAY WITH MEALS.  Marland Kitchen Cholecalciferol 4000 units CAPS Take 1 capsule by mouth every other day.  . cyanocobalamin (,VITAMIN B-12,) 1000 MCG/ML injection Inject 1,000 mcg into the muscle every 30 (thirty) days. On 23nd of the month  . docusate sodium (COLACE) 100 MG capsule Take 100 mg by mouth 2 (two) times daily as needed.  . ferrous sulfate 325 (65 FE) MG tablet Take 325 mg by mouth every other day. With meals  . fluticasone furoate-vilanterol (BREO ELLIPTA) 100-25 MCG/INH AEPB Inhale 1 puff into the lungs daily.   Marland Kitchen ipratropium-albuterol (DUONEB) 0.5-2.5 (3) MG/3ML SOLN Take 3 mLs every 6 (six) hours as needed by nebulization.   Marland Kitchen loratadine (CLARITIN) 10 MG tablet Take 1 tablet (10 mg total) by mouth daily as needed for allergies.  . mirtazapine (REMERON) 7.5 MG tablet Take 7.5 mg at bedtime by mouth.  . nitroGLYCERIN (NITROSTAT) 0.4 MG SL tablet Place 0.4 mg under the tongue every 5 (five) minutes as needed for chest pain.  . Nutritional Supplements (ENSURE ENLIVE PO) Take 1 Bottle by mouth 2 (two) times daily between meals.  . ranitidine (ZANTAC) 75 MG tablet Take 75 mg 2 (two) times daily by mouth.   . spironolactone (ALDACTONE) 25 MG tablet Take 12.5 mg by mouth daily.   No facility-administered encounter medications on file as of 07/10/2018.      SIGNIFICANT DIAGNOSTIC EXAMS   LABS REVIEWED: PREVIOUS   09-02-17: wbc 11.7; hgb 11.6; hct 35.8; mcv 96.8; plt 554; glucose 127; bun 30; creat 0.89; k+ 4.4; na++ 138; ca 8.6; total bili 1.3  albumin 2.7  TODAY:   05-15-18: wbc 9.1; hgb 12.1; hgb 36.4; mcv 96.;5 plt 213; glucose 87; bun 53; creat 1.19 k+ 2.4; na++ 139; ca 8.6; liver normal albumin 3.2    Review of Systems  Constitutional: Negative for malaise/fatigue.  Respiratory: Negative for cough and shortness of breath.   Cardiovascular:  Negative for chest pain, palpitations and leg swelling.  Gastrointestinal: Negative for abdominal pain, constipation and heartburn.  Musculoskeletal: Negative for back pain, joint pain and myalgias.  Skin: Negative.   Neurological: Negative for dizziness.  Psychiatric/Behavioral: The patient is not nervous/anxious.     Physical Exam  Constitutional: She is oriented to person, place, and time. She appears well-developed and well-nourished. No distress.  Frail   Neck: No thyromegaly present.  Cardiovascular: Normal  rate, regular rhythm and intact distal pulses.  Murmur heard. 1/6  Pulmonary/Chest: Effort normal and breath sounds normal. No respiratory distress.  Abdominal: Soft. Bowel sounds are normal. She exhibits no distension. There is no tenderness.  Musculoskeletal: Normal range of motion. She exhibits no edema.  Lymphadenopathy:    She has no cervical adenopathy.  Neurological: She is alert and oriented to person, place, and time.  Skin: Skin is warm and dry. She is not diaphoretic.  Psychiatric: She has a normal mood and affect.    ASSESSMENT/ PLAN:  TODAY;   1.  Chronic combined systolic and diastolic heart failure: is stable will continue coreg 3.125 mg twice daily aldactone 12.5 mg daily   2.  PAF( paroxysmal atrial fibrillation): heart rate stable: will continue asa 81 mg daily and coreg 3.125 mg twice daily  3. CAD in native artery: is status post NSTEMI and cardiac stent:  is stable will continue asa 81 mg daily and has prn ntg  4.  Pernicious anemia: is stable hgb 12.1; will continue iron every other day; and vit B 12 injection monthly   PREVIOUS   5. COPD: is stable will continue breo daily; and has claritin 10 mg daily as needed   6. Mixed hyperlipidemia: is stable will lower lipitor to 10 mg daily and will check lipids in 2 months   7. GERD; without esophagitis: is stable will continue zantac 75 mg twice daily   8. Anorexia: is stable weight is 96 pounds  will continue remeron 7.5 mg nightly   9. Cutaneous lupus erythematosus: is without change in status will not make changes will monitor    MD is aware of resident's narcotic use and is in agreement with current plan of care. We will attempt to wean resident as apropriate   Ok Edwards NP [facility]  Contact [phone] Monday through Friday 8am- 5pm  After hours call [phone]

## 2018-07-29 ENCOUNTER — Encounter
Admission: RE | Admit: 2018-07-29 | Discharge: 2018-07-29 | Disposition: A | Discharge status: Home / Self Care | Payer: Medicare Other | Source: Ambulatory Visit | Attending: Internal Medicine | Admitting: Internal Medicine

## 2018-08-11 ENCOUNTER — Encounter: Payer: Self-pay | Admitting: Adult Health

## 2018-08-11 ENCOUNTER — Non-Acute Institutional Stay: Payer: Medicare Other | Admitting: Adult Health

## 2018-08-11 DIAGNOSIS — E782 Mixed hyperlipidemia: Secondary | ICD-10-CM

## 2018-08-11 DIAGNOSIS — R630 Anorexia: Secondary | ICD-10-CM | POA: Diagnosis not present

## 2018-08-11 DIAGNOSIS — J449 Chronic obstructive pulmonary disease, unspecified: Secondary | ICD-10-CM

## 2018-08-11 DIAGNOSIS — K219 Gastro-esophageal reflux disease without esophagitis: Secondary | ICD-10-CM | POA: Diagnosis not present

## 2018-08-11 NOTE — Progress Notes (Signed)
[facility]   [facility] Room Number: [place] of Service:  ALF (13)   CODE STATUS: Full Code  Allergies  Allergen Reactions  . Penicillins     Has patient had a PCN reaction causing immediate rash, facial/tongue/throat swelling, SOB or lightheadedness with hypotension: Unknown Has patient had a PCN reaction causing severe rash involving mucus membranes or skin necrosis: Unknown Has patient had a PCN reaction that required hospitalization No Has patient had a PCN reaction occurring within the last 10 years: No If all of the above answers are "NO", then may proceed with Cephalosporin use.    Chief Complaint  Patient presents with  . Medical Management of Chronic Issues    Copd; hyperlipidemia gerd; anorexia     HPI:  She is a 84 year old long term resident of assisted living being seen for the management of her chronic illnesses: copd; hyperlipidemia; gerd anorexia. She denies any pain; no insomnia or anxiety. She has a poor appetite which is her normal.   Past Medical History:  Diagnosis Date  . Coronary artery disease 03/06/13   90% prox LAD stenosis s/p DES  . Difficult intubation   . Dizziness   . GERD (gastroesophageal reflux disease)   . Hypotension   . Ischemic cardiomyopathy 03/09/13   s/p NSTEMI. EF 30-35%.  . MI (myocardial infarction) ([place])   . PAF (paroxysmal atrial fibrillation) ([place]) 03/06/13   In setting of NSTEMI and reperfusion, no recurrence  . SVT (supraventricular tachycardia) (HCC)    Self-limited on outpatient cardiac monitor    Past Surgical History:  Procedure Laterality Date  . CESAREAN SECTION    . CORONARY ANGIOPLASTY WITH STENT PLACEMENT  02/2013   DES-mid LAD, mild LCx, RCA stenoses  . INTRAMEDULLARY (IM) NAIL INTERTROCHANTERIC Right 08/19/2017   Procedure: INTRAMEDULLARY (IM) NAIL INTERTROCHANTRIC;  Surgeon: Hessie Knows, MD;  Location: [facility] ORS;  Service: Orthopedics;  Laterality: Right;    Social History    Socioeconomic History  . Marital status: Widowed    Spouse name: Not on file  . Number of children: 3  . Years of education: Not on file  . Highest education level: Not on file  Occupational History  . Not on file  Social Needs  . Financial resource strain: Not on file  . Food insecurity:    Worry: Not on file    Inability: Not on file  . Transportation needs:    Medical: Not on file    Non-medical: Not on file  Tobacco Use  . Smoking status: Never Smoker  . Smokeless tobacco: Never Used  Substance and Sexual Activity  . Alcohol use: No    Comment: occasional  . Drug use: No  . Sexual activity: Never  Lifestyle  . Physical activity:    Days per week: Not on file    Minutes per session: Not on file  . Stress: Not on file  Relationships  . Social connections:    Talks on phone: Not on file    Gets together: Not on file    Attends religious service: Not on file    Active member of club or organization: Not on file    Attends meetings of clubs or organizations: Not on file    Relationship status: Not on file  . Intimate partner violence:    Fear of current or ex partner: Not on file    Emotionally abused: Not on file    Physically abused: Not on file  Forced sexual activity: Not on file  Other Topics Concern  . Not on file  Social History Narrative   Resident at [employer]. Does not regularly exercise.    DNR   Denies alcohol use   Never smoker   3 children   Family History  Problem Relation Age of Onset  . Heart failure Mother   . Heart failure Father   . Heart attack Father       VITAL SIGNS BP (!) 161/57   Pulse 74   Temp (!) 96 F (35.6 C)   Resp 18   Ht 4\' 11"  (1.499 m)   Wt 96 lb (43.5 kg)   SpO2 96%   BMI 19.39 kg/m   Outpatient Encounter Medications as of 08/11/2018  Medication Sig  . Ascorbic Acid (VITAMIN C) 1000 MG tablet Take 1,000 mg by mouth daily.  Marland Kitchen aspirin EC 81 MG tablet Take 1 tablet (81 mg total) by mouth daily.  Marland Kitchen  atorvastatin (LIPITOR) 10 MG tablet TAKE 1 TABLET BY MOUTH AT BEDTIME  . carvedilol (COREG) 3.125 MG tablet TAKE ONE TABLET TWICE A DAY WITH MEALS.  Marland Kitchen Cholecalciferol 4000 units CAPS Take 1 capsule by mouth every other day.  . cyanocobalamin (,VITAMIN B-12,) 1000 MCG/ML injection Inject 1,000 mcg into the muscle every 30 (thirty) days. On 23nd of the month  . docusate sodium (COLACE) 100 MG capsule Take 100 mg by mouth 2 (two) times daily as needed.  . ferrous sulfate 325 (65 FE) MG tablet Take 325 mg by mouth every other day. With meals  . fluticasone furoate-vilanterol (BREO ELLIPTA) 100-25 MCG/INH AEPB Inhale 1 puff into the lungs daily.   Marland Kitchen ipratropium-albuterol (DUONEB) 0.5-2.5 (3) MG/3ML SOLN Take 3 mLs every 6 (six) hours as needed by nebulization.   Marland Kitchen loratadine (CLARITIN) 10 MG tablet Take 1 tablet (10 mg total) by mouth daily as needed for allergies.  . mirtazapine (REMERON) 7.5 MG tablet Take 7.5 mg at bedtime by mouth.  . nitroGLYCERIN (NITROSTAT) 0.4 MG SL tablet Place 0.4 mg under the tongue every 5 (five) minutes as needed for chest pain.  . Nutritional Supplements (ENSURE ENLIVE PO) Take 1 Bottle by mouth. Every other day  . ranitidine (ZANTAC) 75 MG tablet Take 75 mg 2 (two) times daily by mouth.   . spironolactone (ALDACTONE) 25 MG tablet Take 12.5 mg by mouth daily.   No facility-administered encounter medications on file as of 08/11/2018.      SIGNIFICANT DIAGNOSTIC EXAMS  LABS REVIEWED: PREVIOUS   09-02-17: wbc 11.7; hgb 11.6; hct 35.8; mcv 96.8; plt 554; glucose 127; bun 30; creat 0.89; k+ 4.4; na++ 138; ca 8.6; total bili 1.3  albumin 2.7 05-15-18: wbc 9.1; hgb 12.1; hgb 36.4; mcv 96.;5 plt 213; glucose 87; bun 53; creat 1.19 k+ 2.4; na++ 139; ca 8.6; liver normal albumin 3.2   NO NEW LABS.   Review of Systems  Constitutional: Negative for malaise/fatigue.  Respiratory: Negative for cough and shortness of breath.   Cardiovascular: Negative for chest pain,  palpitations and leg swelling.  Gastrointestinal: Negative for abdominal pain, constipation and heartburn.  Musculoskeletal: Negative for back pain, joint pain and myalgias.  Skin: Negative.   Neurological: Negative for dizziness.  Psychiatric/Behavioral: The patient is not nervous/anxious.     Physical Exam  Constitutional: She is oriented to person, place, and time. She appears well-developed and well-nourished. No distress.  Frail   Neck: No thyromegaly present.  Cardiovascular: Normal rate, regular rhythm and intact distal  pulses.  Murmur heard. 1/6  Pulmonary/Chest: Effort normal and breath sounds normal. No respiratory distress.  Abdominal: Soft. Bowel sounds are normal. She exhibits no distension. There is no tenderness.  Musculoskeletal: Normal range of motion. She exhibits no edema.  Lymphadenopathy:    She has no cervical adenopathy.  Neurological: She is alert and oriented to person, place, and time.  Skin: Skin is warm and dry. She is not diaphoretic.  Psychiatric: She has a normal mood and affect.      ASSESSMENT/ PLAN:  TODAY;   1. COPD with asthma: is stable will continue breo daily; and has claritin 10 mg daily as needed   2. Mixed hyperlipidemia: is stable will continue  lipitor  10 mg daily follow up labs due next month  3. GERD; without esophagitis: is stable will continue zantac 75 mg twice daily   4. Anorexia: is stable weight is 96 pounds will continue remeron 7.5 mg nightly   PREVIOUS   5. Cutaneous lupus erythematosus: is without change in status will not make changes will monitor  6.  Chronic combined systolic and diastolic heart failure: is stable will continue coreg 3.125 mg twice daily aldactone 12.5 mg daily   7.  PAF( paroxysmal atrial fibrillation): heart rate stable: will continue asa 81 mg daily and coreg 3.125 mg twice daily for rate control   8. CAD in native artery: is status post NSTEMI and cardiac stent:  is stable will continue asa  81 mg daily and has prn ntg  9.  Pernicious anemia: is stable hgb 12.1; will continue iron every other day; and vit B 12 injection monthly     MD is aware of resident's narcotic use and is in agreement with current plan of care. We will attempt to wean resident as apropriate   Ok Edwards NP [facility]  Contact [phone] Monday through Friday 8am- 5pm  After hours call [phone]

## 2018-08-11 NOTE — Progress Notes (Signed)
Entered in error

## 2018-08-29 ENCOUNTER — Encounter
Admission: RE | Admit: 2018-08-29 | Discharge: 2018-08-29 | Disposition: A | Discharge status: Home / Self Care | Payer: Medicare Other | Source: Ambulatory Visit | Attending: Internal Medicine | Admitting: Internal Medicine

## 2018-09-09 ENCOUNTER — Other Ambulatory Visit
Admission: RE | Admit: 2018-09-09 | Discharge: 2018-09-09 | Disposition: A | Discharge status: Home / Self Care | Payer: Medicare Other | Source: Ambulatory Visit | Attending: Adult Health | Admitting: Adult Health

## 2018-09-09 ENCOUNTER — Non-Acute Institutional Stay: Payer: Medicare Other | Admitting: Adult Health

## 2018-09-09 ENCOUNTER — Encounter: Payer: Self-pay | Admitting: Adult Health

## 2018-09-09 DIAGNOSIS — D510 Vitamin B12 deficiency anemia due to intrinsic factor deficiency: Secondary | ICD-10-CM

## 2018-09-09 DIAGNOSIS — I2510 Atherosclerotic heart disease of native coronary artery without angina pectoris: Secondary | ICD-10-CM | POA: Diagnosis not present

## 2018-09-09 DIAGNOSIS — R630 Anorexia: Secondary | ICD-10-CM | POA: Diagnosis not present

## 2018-09-09 DIAGNOSIS — I5042 Chronic combined systolic (congestive) and diastolic (congestive) heart failure: Secondary | ICD-10-CM | POA: Diagnosis not present

## 2018-09-09 DIAGNOSIS — I471 Supraventricular tachycardia: Secondary | ICD-10-CM

## 2018-09-09 DIAGNOSIS — K219 Gastro-esophageal reflux disease without esophagitis: Secondary | ICD-10-CM | POA: Diagnosis not present

## 2018-09-09 DIAGNOSIS — D649 Anemia, unspecified: Secondary | ICD-10-CM

## 2018-09-09 DIAGNOSIS — J449 Chronic obstructive pulmonary disease, unspecified: Secondary | ICD-10-CM

## 2018-09-09 DIAGNOSIS — E785 Hyperlipidemia, unspecified: Secondary | ICD-10-CM | POA: Insufficient documentation

## 2018-09-09 DIAGNOSIS — L932 Other local lupus erythematosus: Secondary | ICD-10-CM | POA: Diagnosis not present

## 2018-09-09 LAB — LIPID PANEL
Cholesterol: 98 mg/dL (ref 0–200)
HDL: 55 mg/dL (ref >40)
LDL Cholesterol: 38 mg/dL (ref 0–99)
Total CHOL/HDL Ratio: 1.8 RATIO
Triglycerides: 26 mg/dL (ref <150)
VLDL: 5 mg/dL (ref 0–40)

## 2018-09-09 NOTE — Progress Notes (Signed)
Provider:  Ok Edwards, NP [facility]:  [facility] Room Number: [place]:  ALF ([phone])   PCP: Kirk Ruths, MD Patient Care Team: Kirk Ruths, MD as PCP - General (Internal Medicine) Rockey Situ Kathlene November, MD as Consulting Physician (Cardiology) Gerlene Fee, NP as Nurse Practitioner (Geriatric Medicine)  Extended Emergency Contact Information Primary Emergency Contact: Rodriguez,Katherine Address: [address]          [place], [place] [address] [name] Phone: [phone] Work Phone: [phone] Mobile Phone: [phone] Relation: Daughter Secondary Emergency Contact: Rodriguez,Ray Address: [address]          [place]          [place],  [address] [place] Phone: [phone] Relation: Relative  Code Status: Full Code Goals of Care: Advanced Directive information Advanced Directives 09/09/2018  Does Patient Have a Medical Advance Directive? No  Type of Advance Directive -  Does patient want to make changes to medical advance directive? No - Patient declined  Copy of [place] in Chart? -  Would patient like information on creating a medical advance directive? No - Patient declined  Pre-existing out of facility DNR order (yellow form or pink MOST form) -      Allergies  Allergen Reactions  . Penicillins     Has patient had a PCN reaction causing immediate rash, facial/tongue/throat swelling, SOB or lightheadedness with hypotension: Unknown Has patient had a PCN reaction causing severe rash involving mucus membranes or skin necrosis: Unknown Has patient had a PCN reaction that required hospitalization No Has patient had a PCN reaction occurring within the last 10 years: No If all of the above answers are "NO", then may proceed with Cephalosporin use.     Chief Complaint  Patient presents with  . Medical Management of Chronic Issues   Annual Exam    HPI: Patient is a 84 y.o. female seen today for an annual comprehensive examination. She is doing well overall. She has not had any hospitalizations this past year. She denies any excessive fatigue; no chest pain; no lower extremity edema; no heart palpitations. She denies any changes in her appetite; no anxiety; no depressive thoughts.  She has lost 4 pounds over the past year from 99 pounds to her current weight of 95 pounds. She continues to be followed for her chronic illnesses including:  Afib; chf; cad; gerd.    Past Medical History:  Diagnosis Date  . Coronary artery disease 03/06/13   90% prox LAD stenosis s/p DES  . Difficult intubation   . Dizziness   . GERD (gastroesophageal reflux disease)   . Hypotension   . Ischemic cardiomyopathy 03/09/13   s/p NSTEMI. EF 30-35%.  . MI (myocardial infarction) ([place])   . PAF (paroxysmal atrial fibrillation) ([place]) 03/06/13   In setting of NSTEMI and reperfusion, no recurrence  . SVT (supraventricular tachycardia) (HCC)    Self-limited on outpatient cardiac monitor   Past Surgical History:  Procedure Laterality Date  . CESAREAN SECTION    . CORONARY ANGIOPLASTY WITH STENT PLACEMENT  02/2013   DES-mid LAD, mild LCx, RCA stenoses  . INTRAMEDULLARY (IM) NAIL INTERTROCHANTERIC Right 08/19/2017   Procedure: INTRAMEDULLARY (IM) NAIL INTERTROCHANTRIC;  Surgeon: Hessie Knows, MD;  Location: [facility] ORS;  Service: Orthopedics;  Laterality: Right;    reports that she has never smoked. She has never used smokeless tobacco. She reports that she does not drink alcohol  or use drugs. Social History   Socioeconomic History  . Marital status: Widowed    Spouse name: Not on file  . Number of children: 3  . Years of education: Not on file  . Highest education level: Not on file  Occupational History  . Not on file  Social Needs  . Financial resource strain: Not on file  . Food insecurity:    Worry: Not on file    Inability: Not on file  .  Transportation needs:    Medical: Not on file    Non-medical: Not on file  Tobacco Use  . Smoking status: Never Smoker  . Smokeless tobacco: Never Used  Substance and Sexual Activity  . Alcohol use: No    Comment: occasional  . Drug use: No  . Sexual activity: Never  Lifestyle  . Physical activity:    Days per week: Not on file    Minutes per session: Not on file  . Stress: Not on file  Relationships  . Social connections:    Talks on phone: Not on file    Gets together: Not on file    Attends religious service: Not on file    Active member of club or organization: Not on file    Attends meetings of clubs or organizations: Not on file    Relationship status: Not on file  . Intimate partner violence:    Fear of current or ex partner: Not on file    Emotionally abused: Not on file    Physically abused: Not on file    Forced sexual activity: Not on file  Other Topics Concern  . Not on file  Social History Narrative   Resident at [employer]. Does not regularly exercise.    DNR   Denies alcohol use   Never smoker   3 children   Family History  Problem Relation Age of Onset  . Heart failure Mother   . Heart failure Father   . Heart attack Father     Vitals:   09/09/18 1301  BP: (!) 159/57  Pulse: 79  Resp: 20  Temp: (!) 97.4 F (36.3 C)  SpO2: 98%  Weight: 95 lb (43.1 kg)  Height: 4\' 11"  (1.499 m)   Body mass index is 19.19 kg/m.  Allergies as of 09/09/2018      Reactions   Penicillins    Has patient had a PCN reaction causing immediate rash, facial/tongue/throat swelling, SOB or lightheadedness with hypotension: Unknown Has patient had a PCN reaction causing severe rash involving mucus membranes or skin necrosis: Unknown Has patient had a PCN reaction that required hospitalization No Has patient had a PCN reaction occurring within the last 10 years: No If all of the above answers are "NO", then may proceed with Cephalosporin use.      Medication List          Accurate as of 09/09/18  1:09 PM. Always use your most recent med list.          aspirin EC 81 MG tablet Take 1 tablet (81 mg total) by mouth daily.   atorvastatin 10 MG tablet Commonly known as:  LIPITOR Take 10 mg by mouth at bedtime.   BREO ELLIPTA 100-25 MCG/INH Aepb Generic drug:  fluticasone furoate-vilanterol Inhale 1 puff into the lungs daily.   carvedilol 3.125 MG tablet Commonly known as:  COREG TAKE ONE TABLET TWICE A DAY WITH MEALS.   Cholecalciferol 100 MCG (4000 UT) Caps Take 1 capsule by mouth  every other day.   cyanocobalamin 1000 MCG/ML injection Commonly known as:  (VITAMIN B-12) Inject 1,000 mcg into the muscle every 30 (thirty) days. On 23nd of the month   docusate sodium 100 MG capsule Commonly known as:  COLACE Take 100 mg by mouth 2 (two) times daily as needed.   ENSURE ENLIVE PO Take 1 Bottle by mouth. Every other day   ferrous sulfate 325 (65 FE) MG tablet Take 325 mg by mouth every other day. With meals   ipratropium-albuterol 0.5-2.5 (3) MG/3ML Soln Commonly known as:  DUONEB Take 3 mLs every 6 (six) hours as needed by nebulization.   loratadine 10 MG tablet Commonly known as:  CLARITIN Take 1 tablet (10 mg total) by mouth daily as needed for allergies.   mirtazapine 7.5 MG tablet Commonly known as:  REMERON Take 7.5 mg at bedtime by mouth.   nitroGLYCERIN 0.4 MG SL tablet Commonly known as:  NITROSTAT Place 0.4 mg under the tongue every 5 (five) minutes as needed for chest pain.   NON FORMULARY Diet Type: Regular   ranitidine 75 MG tablet Commonly known as:  ZANTAC Take 75 mg 2 (two) times daily by mouth.   spironolactone 25 MG tablet Commonly known as:  ALDACTONE Take 12.5 mg by mouth daily.   vitamin C 1000 MG tablet Take 1,000 mg by mouth daily.        SIGNIFICANT DIAGNOSTIC EXAMS  LABS REVIEWED: PREVIOUS   09-02-17: wbc 11.7; hgb 11.6; hct 35.8; mcv 96.8; plt 554; glucose 127; bun 30; creat 0.89; k+ 4.4;  na++ 138; ca 8.6; total bili 1.3  albumin 2.7 05-15-18: wbc 9.1; hgb 12.1; hgb 36.4; mcv 96.;5 plt 213; glucose 87; bun 53; creat 1.19 k+ 2.4; na++ 139; ca 8.6; liver normal albumin 3.2   TODAY:   09-09-18: chol 98; ldl 38; trig 26; hdl 55     Review of Systems  Constitutional: Negative for malaise/fatigue.  Respiratory: Negative for cough and shortness of breath.   Cardiovascular: Negative for chest pain, palpitations and leg swelling.  Gastrointestinal: Negative for abdominal pain, constipation and heartburn.  Musculoskeletal: Negative for back pain, joint pain and myalgias.  Skin: Negative.   Neurological: Negative for dizziness.  Psychiatric/Behavioral: The patient is not nervous/anxious.     Physical Exam  Constitutional: She is oriented to person, place, and time. No distress.  Frail   Neck: No thyromegaly present.  Cardiovascular: Normal rate, regular rhythm and intact distal pulses.  Murmur heard. 1/6  Pulmonary/Chest: Effort normal and breath sounds normal. No respiratory distress.  Abdominal: Soft. Bowel sounds are normal. She exhibits no distension. There is no tenderness.  Musculoskeletal: Normal range of motion. She exhibits no edema.  Lymphadenopathy:    She has no cervical adenopathy.  Neurological: She is alert and oriented to person, place, and time.  Skin: Skin is warm and dry. She is not diaphoretic.  Psychiatric: She has a normal mood and affect.        ASSESSMENT/ PLAN:  TODAY;   1. COPD with asthma: is stable will continue breo daily; duoneb every 6 hours as needed and has claritin 10 mg daily as needed   2. Mixed hyperlipidemia: is stable LDL  38 will stop lipitor at this time.   3. GERD; without esophagitis: is stable will continue zantac 75 mg twice daily   4. Anorexia: is without change; remeron has not been effective in helping her to gain weight. She has lost 4 pounds over the past  year; will change to every other night for 2 weeks then  stop.    5. Cutaneous lupus erythematosus: is without change in status will not make changes will monitor  6.  Chronic combined systolic and diastolic heart failure: is stable will continue coreg 3.125 mg twice daily aldactone 12.5 mg daily   7.  PAF( paroxysmal atrial fibrillation): heart rate stable: will continue asa 81 mg daily and coreg 3.125 mg twice daily for rate control   8. CAD in native artery: is status post NSTEMI and cardiac stent:  is stable will continue asa 81 mg daily and has prn ntg  9.  Pernicious anemia: is stable hgb 12.1; will stop the iron and will continue iron every vit B 12 injection monthly   Her health maintenance is up to date    MD is aware of resident's narcotic use and is in agreement with current plan of care. We will wean dosage as appropriate for resident   Ok Edwards NP [facility] Adult Medicine  Contact [phone] Monday through Friday 8am- 5pm  After hours call [phone]

## 2018-09-10 ENCOUNTER — Other Ambulatory Visit
Admission: RE | Admit: 2018-09-10 | Discharge: 2018-09-10 | Disposition: A | Discharge status: Home / Self Care | Payer: Medicare Other | Source: Ambulatory Visit | Attending: Adult Health | Admitting: Adult Health

## 2018-09-10 DIAGNOSIS — I5042 Chronic combined systolic (congestive) and diastolic (congestive) heart failure: Secondary | ICD-10-CM | POA: Insufficient documentation

## 2018-09-10 LAB — CBC WITH DIFFERENTIAL/PLATELET
ABS IMMATURE GRANULOCYTES: 0.03 10*3/uL (ref 0.00–0.07)
BASOS ABS: 0.1 10*3/uL (ref 0.0–0.1)
Basophils Relative: 1 %
Eosinophils Absolute: 0.5 10*3/uL (ref 0.0–0.5)
Eosinophils Relative: 6 %
HCT: 39 % (ref 36.0–46.0)
HEMOGLOBIN: 12.1 g/dL (ref 12.0–15.0)
Immature Granulocytes: 0 %
LYMPHS PCT: 25 %
Lymphs Abs: 2.2 10*3/uL (ref 0.7–4.0)
MCH: 31.6 pg (ref 26.0–34.0)
MCHC: 31 g/dL (ref 30.0–36.0)
MCV: 101.8 fL — ABNORMAL HIGH (ref 80.0–100.0)
Monocytes Absolute: 0.9 10*3/uL (ref 0.1–1.0)
Monocytes Relative: 10 %
NEUTROS PCT: 58 %
NRBC: 0 % (ref 0.0–0.2)
Neutro Abs: 5.4 10*3/uL (ref 1.7–7.7)
Platelets: 213 10*3/uL (ref 150–400)
RBC: 3.83 MIL/uL — AB (ref 3.87–5.11)
RDW: 13 % (ref 11.5–15.5)
WBC: 9.1 10*3/uL (ref 4.0–10.5)

## 2018-09-10 LAB — COMPREHENSIVE METABOLIC PANEL
ALBUMIN: 3.4 g/dL — AB (ref 3.5–5.0)
ALK PHOS: 88 U/L (ref 38–126)
ALT: 27 U/L (ref 0–44)
ANION GAP: 9 (ref 5–15)
AST: 33 U/L (ref 15–41)
BUN: 40 mg/dL — ABNORMAL HIGH (ref 8–23)
CALCIUM: 9.1 mg/dL (ref 8.9–10.3)
CHLORIDE: 106 mmol/L (ref 98–111)
CO2: 24 mmol/L (ref 22–32)
CREATININE: 1.21 mg/dL — AB (ref 0.44–1.00)
GFR calc Af Amer: 44 mL/min — ABNORMAL LOW (ref >60)
GFR calc non Af Amer: 38 mL/min — ABNORMAL LOW (ref >60)
GLUCOSE: 123 mg/dL — AB (ref 70–99)
Potassium: 4.7 mmol/L (ref 3.5–5.1)
SODIUM: 139 mmol/L (ref 135–145)
Total Bilirubin: 1 mg/dL (ref 0.3–1.2)
Total Protein: 6.2 g/dL — ABNORMAL LOW (ref 6.5–8.1)

## 2018-11-07 DIAGNOSIS — G3184 Mild cognitive impairment, so stated: Secondary | ICD-10-CM | POA: Diagnosis not present

## 2018-11-07 DIAGNOSIS — I25119 Atherosclerotic heart disease of native coronary artery with unspecified angina pectoris: Secondary | ICD-10-CM | POA: Diagnosis not present

## 2018-11-07 DIAGNOSIS — Z593 Problems related to living in residential institution: Secondary | ICD-10-CM | POA: Diagnosis not present

## 2018-11-07 DIAGNOSIS — I255 Ischemic cardiomyopathy: Secondary | ICD-10-CM | POA: Diagnosis not present

## 2018-11-07 DIAGNOSIS — F0390 Unspecified dementia without behavioral disturbance: Secondary | ICD-10-CM | POA: Diagnosis not present

## 2018-11-07 DIAGNOSIS — E441 Mild protein-calorie malnutrition: Secondary | ICD-10-CM | POA: Diagnosis not present

## 2018-11-10 ENCOUNTER — Encounter
Admission: RE | Admit: 2018-11-10 | Discharge: 2018-11-10 | Disposition: A | Discharge status: Home / Self Care | Payer: Medicare Other | Source: Ambulatory Visit | Attending: Internal Medicine | Admitting: Internal Medicine

## 2018-11-11 ENCOUNTER — Encounter: Payer: Self-pay | Admitting: Adult Health

## 2018-11-11 ENCOUNTER — Non-Acute Institutional Stay: Payer: Medicare Other | Admitting: Adult Health

## 2018-11-11 DIAGNOSIS — K219 Gastro-esophageal reflux disease without esophagitis: Secondary | ICD-10-CM

## 2018-11-11 DIAGNOSIS — R630 Anorexia: Secondary | ICD-10-CM

## 2018-11-11 DIAGNOSIS — E782 Mixed hyperlipidemia: Secondary | ICD-10-CM

## 2018-11-11 DIAGNOSIS — J449 Chronic obstructive pulmonary disease, unspecified: Secondary | ICD-10-CM | POA: Diagnosis not present

## 2018-11-11 NOTE — Progress Notes (Signed)
Location:   [place] [facility] Room Number: [place] of Service:  ALF (13)   CODE STATUS: Full Code  Allergies  Allergen Reactions  . Penicillins     Has patient had a PCN reaction causing immediate rash, facial/tongue/throat swelling, SOB or lightheadedness with hypotension: Unknown Has patient had a PCN reaction causing severe rash involving mucus membranes or skin necrosis: Unknown Has patient had a PCN reaction that required hospitalization No Has patient had a PCN reaction occurring within the last 10 years: No If all of the above answers are "NO", then may proceed with Cephalosporin use.    Chief Complaint  Patient presents with  . Medical Management of Chronic Issues    Copd with asthma; mixed hyperlipidemia; anorexia; gerd without esophagitis     HPI:  She is a 85 year old long term resident of assisted living being seen for the management of her chronic illnesses: copd; hyperlipidemia; anorexia; gerd. She denies any heart burn; no change in her appetite; no uncontrolled pain.   Past Medical History:  Diagnosis Date  . Coronary artery disease 03/06/13   90% prox LAD stenosis s/p DES  . Difficult intubation   . Dizziness   . GERD (gastroesophageal reflux disease)   . Hypotension   . Ischemic cardiomyopathy 03/09/13   s/p NSTEMI. EF 30-35%.  . MI (myocardial infarction) ([place])   . PAF (paroxysmal atrial fibrillation) ([place]) 03/06/13   In setting of NSTEMI and reperfusion, no recurrence  . SVT (supraventricular tachycardia) (HCC)    Self-limited on outpatient cardiac monitor    Past Surgical History:  Procedure Laterality Date  . CESAREAN SECTION    . CORONARY ANGIOPLASTY WITH STENT PLACEMENT  02/2013   DES-mid LAD, mild LCx, RCA stenoses  . INTRAMEDULLARY (IM) NAIL INTERTROCHANTERIC Right 08/19/2017   Procedure: INTRAMEDULLARY (IM) NAIL INTERTROCHANTRIC;  Surgeon: Hessie Knows, MD;  Location: [facility] ORS;  Service: Orthopedics;  Laterality: Right;     Social History   Socioeconomic History  . Marital status: Widowed    Spouse name: Not on file  . Number of children: 3  . Years of education: Not on file  . Highest education level: Not on file  Occupational History  . Not on file  Social Needs  . Financial resource strain: Not on file  . Food insecurity:    Worry: Not on file    Inability: Not on file  . Transportation needs:    Medical: Not on file    Non-medical: Not on file  Tobacco Use  . Smoking status: Never Smoker  . Smokeless tobacco: Never Used  Substance and Sexual Activity  . Alcohol use: No    Comment: occasional  . Drug use: No  . Sexual activity: Never  Lifestyle  . Physical activity:    Days per week: Not on file    Minutes per session: Not on file  . Stress: Not on file  Relationships  . Social connections:    Talks on phone: Not on file    Gets together: Not on file    Attends religious service: Not on file    Active member of club or organization: Not on file    Attends meetings of clubs or organizations: Not on file    Relationship status: Not on file  . Intimate partner violence:    Fear of current or ex partner: Not on file    Emotionally abused: Not on file    Physically abused: Not on  file    Forced sexual activity: Not on file  Other Topics Concern  . Not on file  Social History Narrative   Resident at [employer]. Does not regularly exercise.    DNR   Denies alcohol use   Never smoker   3 children   Family History  Problem Relation Age of Onset  . Heart failure Mother   . Heart failure Father   . Heart attack Father       VITAL SIGNS BP (!) 110/59   Pulse 71   Temp 97.6 F (36.4 C)   Resp 18   Ht 4\' 11"  (1.499 m)   Wt 94 lb 6.4 oz (42.8 kg)   SpO2 96%   BMI 19.07 kg/m   Outpatient Encounter Medications as of 11/11/2018  Medication Sig  . Ascorbic Acid (VITAMIN C) 1000 MG tablet Take 1,000 mg by mouth daily.  Marland Kitchen aspirin EC 81 MG tablet Take 1 tablet (81 mg total)  by mouth daily.  . carvedilol (COREG) 3.125 MG tablet TAKE ONE TABLET TWICE A DAY WITH MEALS.  Marland Kitchen Cholecalciferol 4000 units CAPS Take 1 capsule by mouth every other day.  . docusate sodium (COLACE) 100 MG capsule Take 100 mg by mouth 2 (two) times daily as needed.  Marland Kitchen ipratropium-albuterol (DUONEB) 0.5-2.5 (3) MG/3ML SOLN Take 3 mLs every 6 (six) hours as needed by nebulization.   Marland Kitchen loratadine (CLARITIN) 10 MG tablet Take 1 tablet (10 mg total) by mouth daily as needed for allergies.  . nitroGLYCERIN (NITROSTAT) 0.4 MG SL tablet Place 0.4 mg under the tongue every 5 (five) minutes as needed for chest pain.  . NON FORMULARY Diet Type: Regular  . Nutritional Supplements (ENSURE ENLIVE PO) Take 1 Bottle by mouth. Every other day  . ranitidine (ZANTAC) 75 MG tablet Take 75 mg 2 (two) times daily by mouth.   . spironolactone (ALDACTONE) 25 MG tablet Take 12.5 mg by mouth daily.  . vitamin B-12 (CYANOCOBALAMIN) 1000 MCG tablet Take 1,000 mcg by mouth daily.   No facility-administered encounter medications on file as of 11/11/2018.      SIGNIFICANT DIAGNOSTIC EXAMS  LABS REVIEWED: PREVIOUS   09-02-17: wbc 11.7; hgb 11.6; hct 35.8; mcv 96.8; plt 554; glucose 127; bun 30; creat 0.89; k+ 4.4; na++ 138; ca 8.6; total bili 1.3  albumin 2.7 05-15-18: wbc 9.1; hgb 12.1; hgb 36.4; mcv 96.;5 plt 213; glucose 87; bun 53; creat 1.19 k+ 2.4; na++ 139; ca 8.6; liver normal albumin 3.2  09-09-18: chol 98; ldl 38; trig 26; hdl 55  LABS REVIEWED:  TODAY:   09-10-18: wbc 9.1; hgb 12.1; hct 39.0; mcv 101.8; plt 213 glucose 123; bun 40; creat 1.21; k+ 4.7; na++ 139 ca 9.1; liver normal albumin 3.4     Review of Systems  Constitutional: Negative for malaise/fatigue.  Respiratory: Negative for cough and shortness of breath.   Cardiovascular: Negative for chest pain, palpitations and leg swelling.  Gastrointestinal: Negative for abdominal pain, constipation and heartburn.  Musculoskeletal: Negative for back pain,  joint pain and myalgias.  Skin: Negative.   Neurological: Negative for dizziness.  Psychiatric/Behavioral: The patient is not nervous/anxious.     Physical Exam Constitutional:      General: She is not in acute distress.    Appearance: She is well-developed. She is not diaphoretic.     Comments: Frail   Neck:     Thyroid: No thyromegaly.  Cardiovascular:     Rate and Rhythm: Normal rate and regular rhythm.  Pulses: Normal pulses.     Heart sounds: Murmur present.     Comments: 1/6 Pulmonary:     Effort: Pulmonary effort is normal. No respiratory distress.     Breath sounds: Normal breath sounds.  Abdominal:     General: Bowel sounds are normal. There is no distension.     Palpations: Abdomen is soft.     Tenderness: There is no abdominal tenderness.  Musculoskeletal: Normal range of motion.     Right lower leg: No edema.     Left lower leg: No edema.  Lymphadenopathy:     Cervical: No cervical adenopathy.  Skin:    General: Skin is warm and dry.  Neurological:     Mental Status: She is alert and oriented to person, place, and time.  Psychiatric:        Mood and Affect: Mood normal.       ASSESSMENT/ PLAN:  TODAY;   1. COPD with asthma: is stable will continue duoneb every 6 hours as needed and has claritin 10 mg daily as needed  Her breo has been discontinued   2. Mixed hyperlipidemia: is stable LDL  38 is off lipitor    3. GERD; without esophagitis: is stable will continue zantac 75 mg twice daily   4. Anorexia: is without change her current weight is 94 pounds she is off remeron   PREVIOUS    5. Cutaneous lupus erythematosus: is without change in status will not make changes will monitor  6.  Chronic combined systolic and diastolic heart failure: is stable will continue coreg 3.125 mg twice daily aldactone 12.5 mg daily   7.  PAF( paroxysmal atrial fibrillation): heart rate stable: will continue asa 81 mg daily and coreg 3.125 mg twice daily for rate  control   8. CAD in native artery: is status post NSTEMI and cardiac stent:  is stable will continue asa 81 mg daily and has prn ntg  9.  Pernicious anemia: is stable hgb 12.1; iron and vit B 12 injections have been stopped.      MD is aware of resident's narcotic use and is in agreement with current plan of care. We will attempt to wean resident as apropriate   Ok Edwards NP [facility]  Contact [phone] Monday through Friday 8am- 5pm  After hours call [phone]

## 2018-11-29 ENCOUNTER — Encounter
Admission: RE | Admit: 2018-11-29 | Discharge: 2018-11-29 | Disposition: A | Discharge status: Home / Self Care | Payer: Medicare Other | Source: Ambulatory Visit | Attending: Internal Medicine | Admitting: Internal Medicine

## 2018-12-20 ENCOUNTER — Other Ambulatory Visit: Payer: Self-pay

## 2018-12-20 ENCOUNTER — Emergency Department
Admission: EM | Admit: 2018-12-20 | Discharge: 2018-12-21 | Disposition: A | Discharge status: Home / Self Care | Payer: Medicare Other | Attending: Emergency Medicine | Admitting: Emergency Medicine

## 2018-12-20 ENCOUNTER — Emergency Department: Payer: Medicare Other

## 2018-12-20 DIAGNOSIS — Z79899 Other long term (current) drug therapy: Secondary | ICD-10-CM | POA: Insufficient documentation

## 2018-12-20 DIAGNOSIS — I509 Heart failure, unspecified: Secondary | ICD-10-CM | POA: Diagnosis not present

## 2018-12-20 DIAGNOSIS — I2510 Atherosclerotic heart disease of native coronary artery without angina pectoris: Secondary | ICD-10-CM | POA: Diagnosis not present

## 2018-12-20 DIAGNOSIS — R0609 Other forms of dyspnea: Secondary | ICD-10-CM

## 2018-12-20 DIAGNOSIS — R079 Chest pain, unspecified: Secondary | ICD-10-CM | POA: Diagnosis not present

## 2018-12-20 DIAGNOSIS — I4891 Unspecified atrial fibrillation: Secondary | ICD-10-CM | POA: Diagnosis not present

## 2018-12-20 DIAGNOSIS — I110 Hypertensive heart disease with heart failure: Secondary | ICD-10-CM | POA: Diagnosis not present

## 2018-12-20 DIAGNOSIS — I252 Old myocardial infarction: Secondary | ICD-10-CM | POA: Diagnosis not present

## 2018-12-20 DIAGNOSIS — J449 Chronic obstructive pulmonary disease, unspecified: Secondary | ICD-10-CM | POA: Insufficient documentation

## 2018-12-20 DIAGNOSIS — R0602 Shortness of breath: Secondary | ICD-10-CM | POA: Diagnosis not present

## 2018-12-20 LAB — CBC
HCT: 42.9 % (ref 36.0–46.0)
HEMOGLOBIN: 13.9 g/dL (ref 12.0–15.0)
MCH: 31.7 pg (ref 26.0–34.0)
MCHC: 32.4 g/dL (ref 30.0–36.0)
MCV: 97.9 fL (ref 80.0–100.0)
NRBC: 0 % (ref 0.0–0.2)
Platelets: 254 10*3/uL (ref 150–400)
RBC: 4.38 MIL/uL (ref 3.87–5.11)
RDW: 12.2 % (ref 11.5–15.5)
WBC: 10.8 10*3/uL — ABNORMAL HIGH (ref 4.0–10.5)

## 2018-12-20 LAB — BASIC METABOLIC PANEL
ANION GAP: 9 (ref 5–15)
BUN: 49 mg/dL — ABNORMAL HIGH (ref 8–23)
CO2: 24 mmol/L (ref 22–32)
Calcium: 9.3 mg/dL (ref 8.9–10.3)
Chloride: 104 mmol/L (ref 98–111)
Creatinine, Ser: 1.28 mg/dL — ABNORMAL HIGH (ref 0.44–1.00)
GFR calc Af Amer: 42 mL/min — ABNORMAL LOW (ref >60)
GFR, EST NON AFRICAN AMERICAN: 37 mL/min — AB (ref >60)
GLUCOSE: 97 mg/dL (ref 70–99)
POTASSIUM: 4.7 mmol/L (ref 3.5–5.1)
Sodium: 137 mmol/L (ref 135–145)

## 2018-12-20 LAB — TROPONIN I
TROPONIN I: 0.03 ng/mL — AB (ref <0.03)
Troponin I: 0.03 ng/mL (ref <0.03)

## 2018-12-20 MED ORDER — FUROSEMIDE 20 MG PO TABS: 20.0000 mg | ORAL_TABLET | ORAL | 0 refills | Status: DC

## 2018-12-20 MED ORDER — FUROSEMIDE 40 MG PO TABS
20.0000 mg | ORAL_TABLET | Freq: Once | ORAL | Status: AC
  Administered 2018-12-20: 20 mg via ORAL
  Filled 2018-12-20: qty 1

## 2018-12-20 MED ORDER — POTASSIUM CHLORIDE ER 10 MEQ PO TBCR: 10.0000 meq | EXTENDED_RELEASE_TABLET | ORAL | 0 refills | Status: DC

## 2018-12-20 NOTE — ED Triage Notes (Signed)
First Nurse Note:  C/O SOB with exertion x 2-3 days.  Patient is AAOx3.  skin warm and dry. No SOB/ DOE.  NAD

## 2018-12-20 NOTE — ED Provider Notes (Signed)
[facility] Emergency Department Provider Note  ____________________________________________  Time seen: Approximately 11:19 PM  I have reviewed the triage vital signs and the nursing notes.   HISTORY  Chief Complaint Shortness of Breath    HPI Linda Hart is a 85 y.o. female with a history of CAD, GERD, MI, paroxysmal A. fib, COPD, dementia, chronic heart failure who comes to the ED  complaining of worsening dyspnea on exertion over the past 3 weeks.  Over the past several days it is become severe enough that she gets short of breath even walking about 10 feet.  Better with rest.  No fever chills cough dizziness or syncope.  No chest pain.  Has been advised to take Lasix as needed by her cardiologist in the past, but has not been taking it because her assisted living facility administers her medications. Denies orthopnea, denies PND, denies peripheral edema    Past Medical History:  Diagnosis Date  . Coronary artery disease 03/06/13   90% prox LAD stenosis s/p DES  . Difficult intubation   . Dizziness   . GERD (gastroesophageal reflux disease)   . Hypotension   . Ischemic cardiomyopathy 03/09/13   s/p NSTEMI. EF 30-35%.  . MI (myocardial infarction) ([place])   . PAF (paroxysmal atrial fibrillation) ([place]) 03/06/13   In setting of NSTEMI and reperfusion, no recurrence  . SVT (supraventricular tachycardia) (H[facility])    Self-limited on outpatient cardiac monitor     Patient Active Problem List   Diagnosis Date Noted  . COPD with asthma ([place]) 08/11/2018  . GERD without esophagitis 05/12/2018  . Closed right hip fracture ([place]) 08/17/2017  . Cutaneous lupus erythematosus 04/05/2017  . Anorexia 04/20/2016  . Bradycardia 04/20/2016  . Chronic combined systolic and diastolic heart failure ([place]) 09/24/2015  . Anemia 01/26/2014  . Dry eyes 01/04/2014  . Cardiomyopathy, ischemic 03/17/2013  . Hyperlipidemia 03/17/2013  . CAD in native artery 03/17/2013  . PAF  (paroxysmal atrial fibrillation) ([place]) 03/17/2013  . Pernicious anemia 07/10/2011  . SVT (supraventricular tachycardia) ([place]) 03/21/2011     Past Surgical History:  Procedure Laterality Date  . CESAREAN SECTION    . CORONARY ANGIOPLASTY WITH STENT PLACEMENT  02/2013   DES-mid LAD, mild LCx, RCA stenoses  . INTRAMEDULLARY (IM) NAIL INTERTROCHANTERIC Right 08/19/2017   Procedure: INTRAMEDULLARY (IM) NAIL INTERTROCHANTRIC;  Surgeon: Hessie Knows, MD;  Location: [facility] ORS;  Service: Orthopedics;  Laterality: Right;     Prior to Admission medications   Medication Sig Start Date End Date Taking? Authorizing Provider  Ascorbic Acid (VITAMIN C) 1000 MG tablet Take 1,000 mg by mouth daily.    [provider]  aspirin EC 81 MG tablet Take 1 tablet (81 mg total) by mouth daily. 12/23/17   Minna Merritts, MD  carvedilol (COREG) 3.125 MG tablet TAKE ONE TABLET TWICE A DAY WITH MEALS. 02/22/17   Leone Haven, MD  Cholecalciferol 4000 units CAPS Take 1 capsule by mouth every other day.    [provider]  docusate sodium (COLACE) 100 MG capsule Take 100 mg by mouth 2 (two) times daily as needed.    [provider]  furosemide (LASIX) 20 MG tablet Take 1 tablet (20 mg total) by mouth every other day for 14 days. 12/22/18 01/05/19  Carrie Mew, MD  ipratropium-albuterol (DUONEB) 0.5-2.5 (3) MG/3ML SOLN Take 3 mLs every 6 (six) hours as needed by nebulization.     [provider]  loratadine (CLARITIN) 10 MG tablet Take 1  tablet (10 mg total) by mouth daily as needed for allergies. 08/22/17   Gladstone Lighter, MD  nitroGLYCERIN (NITROSTAT) 0.4 MG SL tablet Place 0.4 mg under the tongue every 5 (five) minutes as needed for chest pain.    [provider]  NON FORMULARY Diet Type: Regular    [provider]  Nutritional Supplements (ENSURE ENLIVE PO) Take 1 Bottle by mouth. Every other day    [provider]  potassium chloride (K-DUR)  10 MEQ tablet Take 1 tablet (10 mEq total) by mouth every other day for 14 days. 12/22/18 01/05/19  Carrie Mew, MD  ranitidine (ZANTAC) 75 MG tablet Take 75 mg 2 (two) times daily by mouth.     [provider]  spironolactone (ALDACTONE) 25 MG tablet Take 12.5 mg by mouth daily.    [provider]  vitamin B-12 (CYANOCOBALAMIN) 1000 MCG tablet Take 1,000 mcg by mouth daily. 10/30/18   [provider]     Allergies Penicillins   Family History  Problem Relation Age of Onset  . Heart failure Mother   . Heart failure Father   . Heart attack Father     Social History Social History   Tobacco Use  . Smoking status: Never Smoker  . Smokeless tobacco: Never Used  Substance Use Topics  . Alcohol use: No    Comment: occasional  . Drug use: No    Review of Systems  Constitutional:   No fever or chills.  ENT:   No sore throat. No rhinorrhea. Cardiovascular:   No chest pain or syncope. Respiratory:   Positive shortness of breath without cough. Gastrointestinal:   Negative for abdominal pain, vomiting and diarrhea.  Musculoskeletal:   Negative for focal pain or swelling All other systems reviewed and are negative except as documented above in ROS and HPI.  ____________________________________________   PHYSICAL EXAM:  VITAL SIGNS: ED Triage Vitals  Enc Vitals Group     BP 12/20/18 1932 139/63     Pulse Rate 12/20/18 1932 92     Resp 12/20/18 1932 20     Temp 12/20/18 1932 98.5 F (36.9 C)     Temp Source 12/20/18 1932 Oral     SpO2 12/20/18 1932 97 %     Weight 12/20/18 1930 92 lb (41.7 kg)     Height 12/20/18 1930 4\' 11"  (1.499 m)     Head Circumference --      Peak Flow --      Pain Score 12/20/18 1930 0     Pain Loc --      Pain Edu? --      Excl. in [place]? --     Vital signs reviewed, nursing assessments reviewed.   Constitutional:   Alert and oriented. Non-toxic appearance. Eyes:   Conjunctivae are normal. EOMI. PERRL. ENT       Head:   Normocephalic and atraumatic.      Nose:   No congestion/rhinnorhea.       Mouth/Throat:   MMM, no pharyngeal erythema. No peritonsillar mass.       Neck:   No meningismus. Full ROM.  No JVD Hematological/Lymphatic/Immunilogical:   No cervical lymphadenopathy. Cardiovascular:   Irregularly irregular rhythm, rate controlled. Symmetric bilateral radial and DP pulses.  No murmurs. Cap refill less than 2 seconds. Respiratory:   Normal respiratory effort without tachypnea/retractions. Breath sounds are clear and equal bilaterally. No wheezes/rales/rhonchi. Gastrointestinal:   Soft and nontender. Non distended. There is no CVA tenderness.  No  rebound, rigidity, or guarding. Musculoskeletal:   Normal range of motion in all extremities. No joint effusions.  No lower extremity tenderness.  No edema. Neurologic:   Normal speech and language.  Motor grossly intact. No acute focal neurologic deficits are appreciated.  Skin:    Skin is warm, dry and intact. No rash noted.  No petechiae, purpura, or bullae.  ____________________________________________    LABS (pertinent positives/negatives) (all labs ordered are listed, but only abnormal results are displayed) Labs Reviewed  BASIC METABOLIC PANEL - Abnormal; Notable for the following components:      Result Value   BUN 49 (*)    Creatinine, Ser 1.28 (*)    GFR calc non Af Amer 37 (*)    GFR calc Af Amer 42 (*)    All other components within normal limits  CBC - Abnormal; Notable for the following components:   WBC 10.8 (*)    All other components within normal limits  TROPONIN I - Abnormal; Notable for the following components:   Troponin I 0.03 (*)    All other components within normal limits  TROPONIN I   ____________________________________________   EKG  Interpreted by me Normal sinus rhythm rate of 95, normal axis intervals QRS ST segments and T waves.  ____________________________________________    RADIOLOGY  Dg  Chest 2 View  Result Date: 12/20/2018 CLINICAL DATA:  Shortness of breath, chest pain EXAM: CHEST - 2 VIEW COMPARISON:  08/17/2017 FINDINGS: There is hyperinflation of the lungs compatible with COPD. Heart and mediastinal contours are within normal limits. No focal opacities or effusions. No acute bony abnormality. IMPRESSION: COPD.  No active disease. Electronically Signed   By: Rolm Baptise M.D.   On: 12/20/2018 20:12    ____________________________________________   PROCEDURES Procedures  ____________________________________________    CLINICAL IMPRESSION / ASSESSMENT AND PLAN / ED COURSE  Medications ordered in the ED: Medications  furosemide (LASIX) tablet 20 mg (20 mg Oral Given 12/20/18 2246)    Pertinent labs & imaging results that were available during my care of the patient were reviewed by me and considered in my medical decision making (see chart for details).    Patient presents with symptoms of mild congestive heart failure exacerbation.  Oxygenation is normal, chest x-ray unremarkable, labs unremarkable.  Patient is in assisted living and has 2 daughters who are also very helpful.  Offered hospitalization for diuresis and further symptom monitoring, the patient strongly prefers to go home and wants to be discharged to follow-up with her cardiologist Dr. Rockey Situ.  I think this is reasonable.  I will check a second troponin and give a dose of Lasix here in the ED.  Because of her low weight, age and comorbidities indicating a degree of frailty, I have instructed her to take the Lasix 1 tablet every other day, with a plan to discontinue it when she feels back to normal so that she does not get too dehydrated.  She and her daughters understand that if the patient feels worse at any point she should come back to the hospital for reassessment.  Clinical Course as of Dec 20 2349  Sat Dec 20, 2018  2351 Serial troponin stable, suitable for outpatient follow-up.   [PS]    Clinical  Course User Index [PS] Carrie Mew, MD     ____________________________________________   FINAL CLINICAL IMPRESSION(S) / ED DIAGNOSES    Final diagnoses:  Congestive heart failure, unspecified HF chronicity, unspecified heart failure type ([place])  DOE (dyspnea on  exertion)     ED Discharge Orders         Ordered    furosemide (LASIX) 20 MG tablet  Every other day     12/20/18 2318    potassium chloride (K-DUR) 10 MEQ tablet  Every other day     12/20/18 2318          Portions of this note were generated with dragon dictation software. Dictation errors may occur despite best attempts at proofreading.   Carrie Mew, MD 12/20/18 2352

## 2018-12-20 NOTE — ED Triage Notes (Signed)
Reports having shortness of breath.  Reports had pain earlier in center of chest but none at this time.  Patient is able to speak in complete sentences when talking.

## 2018-12-20 NOTE — ED Notes (Signed)
oob to bathroom with assist

## 2018-12-20 NOTE — ED Notes (Signed)
md at beside

## 2018-12-25 DIAGNOSIS — Z9181 History of falling: Secondary | ICD-10-CM | POA: Diagnosis not present

## 2018-12-25 DIAGNOSIS — R630 Anorexia: Secondary | ICD-10-CM | POA: Diagnosis not present

## 2018-12-25 DIAGNOSIS — L932 Other local lupus erythematosus: Secondary | ICD-10-CM | POA: Diagnosis not present

## 2018-12-25 DIAGNOSIS — M6281 Muscle weakness (generalized): Secondary | ICD-10-CM | POA: Diagnosis not present

## 2018-12-26 DIAGNOSIS — L932 Other local lupus erythematosus: Secondary | ICD-10-CM | POA: Diagnosis not present

## 2018-12-26 DIAGNOSIS — Z9181 History of falling: Secondary | ICD-10-CM | POA: Diagnosis not present

## 2018-12-26 DIAGNOSIS — R630 Anorexia: Secondary | ICD-10-CM | POA: Diagnosis not present

## 2018-12-26 DIAGNOSIS — M6281 Muscle weakness (generalized): Secondary | ICD-10-CM | POA: Diagnosis not present

## 2018-12-29 DIAGNOSIS — L932 Other local lupus erythematosus: Secondary | ICD-10-CM | POA: Diagnosis not present

## 2018-12-29 DIAGNOSIS — M6281 Muscle weakness (generalized): Secondary | ICD-10-CM | POA: Diagnosis not present

## 2018-12-29 DIAGNOSIS — R630 Anorexia: Secondary | ICD-10-CM | POA: Diagnosis not present

## 2018-12-29 DIAGNOSIS — Z9181 History of falling: Secondary | ICD-10-CM | POA: Diagnosis not present

## 2018-12-30 ENCOUNTER — Telehealth: Payer: Self-pay | Admitting: Cardiovascular Disease

## 2018-12-30 DIAGNOSIS — M6281 Muscle weakness (generalized): Secondary | ICD-10-CM | POA: Diagnosis not present

## 2018-12-30 DIAGNOSIS — L932 Other local lupus erythematosus: Secondary | ICD-10-CM | POA: Diagnosis not present

## 2018-12-30 DIAGNOSIS — R630 Anorexia: Secondary | ICD-10-CM | POA: Diagnosis not present

## 2018-12-30 DIAGNOSIS — Z9181 History of falling: Secondary | ICD-10-CM | POA: Diagnosis not present

## 2018-12-30 NOTE — Telephone Encounter (Signed)
Pt c/o medication issue:  1. Name of Medication: card meds / fluid pill   2. How are you currently taking this medication (dosage and times per day)?   3. Are you having a reaction (difficulty breathing--STAT)?  No   4. What is your medication issue? Daughter was recently with patient in ed and wants to clarify current vs previous ov med list .  She is concerned that adjustments may have been made at facility

## 2018-12-30 NOTE — Telephone Encounter (Signed)
Spoke with patient's daughter.  She is concerned that patient's medications have been changed by providers at the facility patient is at.  She wishes that only Dr Rockey Situ would make changes with cardiac medications such as her atorvastatin and furosemide.  She was trying to figure out when they were removed from her list. I reviewed list for patient at last OV with Dr Rockey Situ 05/2018.  She did not realize furosemide was not on patient's list at that time.  I advised her to try to talk to nursing care facility about a time to meet with the provider who sees patient there. She verbalized understanding of this.  Patient has appointment with Dr Rockey Situ coming up in April which daughter plans to be at.

## 2018-12-31 ENCOUNTER — Encounter: Payer: Self-pay | Admitting: Adult Health

## 2018-12-31 ENCOUNTER — Encounter
Admission: RE | Admit: 2018-12-31 | Discharge: 2018-12-31 | Disposition: A | Discharge status: Home / Self Care | Payer: Medicare Other | Source: Ambulatory Visit | Attending: Internal Medicine | Admitting: Internal Medicine

## 2018-12-31 ENCOUNTER — Non-Acute Institutional Stay: Payer: Medicare Other | Admitting: Adult Health

## 2018-12-31 DIAGNOSIS — L932 Other local lupus erythematosus: Secondary | ICD-10-CM | POA: Diagnosis not present

## 2018-12-31 DIAGNOSIS — I480 Paroxysmal atrial fibrillation: Secondary | ICD-10-CM | POA: Diagnosis not present

## 2018-12-31 DIAGNOSIS — M6281 Muscle weakness (generalized): Secondary | ICD-10-CM | POA: Diagnosis not present

## 2018-12-31 DIAGNOSIS — I2510 Atherosclerotic heart disease of native coronary artery without angina pectoris: Secondary | ICD-10-CM

## 2018-12-31 DIAGNOSIS — K219 Gastro-esophageal reflux disease without esophagitis: Secondary | ICD-10-CM | POA: Diagnosis not present

## 2018-12-31 DIAGNOSIS — I5042 Chronic combined systolic (congestive) and diastolic (congestive) heart failure: Secondary | ICD-10-CM

## 2018-12-31 DIAGNOSIS — R630 Anorexia: Secondary | ICD-10-CM | POA: Diagnosis not present

## 2018-12-31 DIAGNOSIS — Z9181 History of falling: Secondary | ICD-10-CM | POA: Diagnosis not present

## 2018-12-31 NOTE — Progress Notes (Signed)
[facility] Room Number: [id number] Place of Service:  ALF [phone]) Provider:  Durenda Age, NP  Patient Care Team: Kirk Ruths, MD as PCP - General (Internal Medicine) Minna Merritts, MD as Consulting Physician (Cardiology) Gerlene Fee, NP as Nurse Practitioner (Geriatric Medicine)  Extended Emergency Contact Information Primary Emergency Contact: Rodriguez,Katherine Address: [address]          [place], [place] [address] [name] of [place] Phone: [phone] Work Phone: [phone] Mobile Phone: [phone] Relation: Daughter Secondary Emergency Contact: Rodriguez,Ray Address: [address]          [place]          [place], [place] [address] [place] Phone: [phone] Relation: Relative  Code Status:  Full Code  Goals of care: Advanced Directive information Advanced Directives 12/20/2018  Does Patient Have a Medical Advance Directive? Yes  Type of Paramedic of [place];Living will  Does patient want to make changes to medical advance directive? -  Copy of [place] in Chart? -  Would patient like information on creating a medical advance directive? -  Pre-existing out of facility DNR order (yellow form or pink MOST form) -     Chief Complaint  Patient presents with  . Medical Management of Chronic Issues    Routine [facility] visit    HPI:  Pt is a 85 y.o. female seen today for medical management of chronic diseases.  She is a long-term care resident of [facility]. She has a PMH of lupus, ischemic cardiomyopathy, COPD, chronic combined systolic and diastolic congestive heart failure , HLD, and GERD. She was seen today in her room. No lower extremity edema noted. She said that she has SOB only when she is rushing to dress up and needing to walk long way in a hurry. No reported chest pains.   Past Medical History:  Diagnosis Date    . Coronary artery disease 03/06/13   90% prox LAD stenosis s/p DES  . Difficult intubation   . Dizziness   . GERD (gastroesophageal reflux disease)   . Hypotension   . Ischemic cardiomyopathy 03/09/13   s/p NSTEMI. EF 30-35%.  . MI (myocardial infarction) ([place])   . PAF (paroxysmal atrial fibrillation) ([place]) 03/06/13   In setting of NSTEMI and reperfusion, no recurrence  . SVT (supraventricular tachycardia) (HCC)    Self-limited on outpatient cardiac monitor   Past Surgical History:  Procedure Laterality Date  . CESAREAN SECTION    . CORONARY ANGIOPLASTY WITH STENT PLACEMENT  02/2013   DES-mid LAD, mild LCx, RCA stenoses  . INTRAMEDULLARY (IM) NAIL INTERTROCHANTERIC Right 08/19/2017   Procedure: INTRAMEDULLARY (IM) NAIL INTERTROCHANTRIC;  Surgeon: Hessie Knows, MD;  Location: [facility] ORS;  Service: Orthopedics;  Laterality: Right;    Allergies  Allergen Reactions  . Penicillins     Has patient had a PCN reaction causing immediate rash, facial/tongue/throat swelling, SOB or lightheadedness with hypotension: Unknown Has patient had a PCN reaction causing severe rash involving mucus membranes or skin necrosis: Unknown Has patient had a PCN reaction that required hospitalization No Has patient had a PCN reaction occurring within the last 10 years: No If all of the above answers are "NO", then may proceed with Cephalosporin use.    Outpatient Encounter Medications as of 12/31/2018  Medication Sig  . Ascorbic Acid (VITAMIN C) 1000 MG tablet Take 1,000 mg by mouth daily.  Marland Kitchen aspirin  EC 81 MG tablet Take 1 tablet (81 mg total) by mouth daily.  . carvedilol (COREG) 3.125 MG tablet TAKE ONE TABLET TWICE A DAY WITH MEALS.  Marland Kitchen Cholecalciferol 4000 units CAPS Take 1 capsule by mouth every other day.  . docusate sodium (COLACE) 100 MG capsule Take 100 mg by mouth 2 (two) times daily as needed.  . furosemide (LASIX) 20 MG tablet Take 1 tablet (20 mg total) by mouth every other day for 14 days.  Marland Kitchen  loratadine (CLARITIN) 10 MG tablet Take 1 tablet (10 mg total) by mouth daily as needed for allergies.  . nitroGLYCERIN (NITROSTAT) 0.4 MG SL tablet Place 0.4 mg under the tongue every 5 (five) minutes as needed for chest pain.  . NON FORMULARY Diet Type: Regular  . Nutritional Supplements (ENSURE ENLIVE PO) Take 1 Bottle by mouth. Every other day  . potassium chloride (K-DUR) 10 MEQ tablet Take 1 tablet (10 mEq total) by mouth every other day for 14 days.  . ranitidine (ZANTAC) 75 MG tablet Take 75 mg 2 (two) times daily by mouth.   . spironolactone (ALDACTONE) 25 MG tablet Take 12.5 mg by mouth daily.  . vitamin B-12 (CYANOCOBALAMIN) 1000 MCG tablet Take 1,000 mcg by mouth daily.    No facility-administered encounter medications on file as of 12/31/2018.     Review of Systems  GENERAL: No change in appetite, no fatigue, no weight changes, no fever, chills or weakness MOUTH and THROAT: Denies oral discomfort, gingival pain or bleeding RESPIRATORY: no cough, SOB, DOE, wheezing, hemoptysis CARDIAC: No chest pain, edema or palpitations GI: No abdominal pain, diarrhea, constipation, heart burn, nausea or vomiting GU: Denies dysuria, frequency, hematuria, incontinence, or discharge NEUROLOGICAL: Denies dizziness, syncope, numbness, or headache PSYCHIATRIC: Denies feelings of depression or anxiety. No report of hallucinations, insomnia, paranoia, or agitation   Immunization History  Administered Date(s) Administered  . Influenza-Unspecified 07/30/2014, 07/17/2017, 08/28/2018  . Pneumococcal Conjugate-13 08/10/2014   Pertinent  Health Maintenance Due  Topic Date Due  . PNA vac Low Risk Adult (2 of 2 - PPSV23) 08/11/2015  . INFLUENZA VACCINE  Completed  . DEXA SCAN  Completed   Fall Risk  11/26/2016  Falls in the past year? No     Vitals:   12/31/18 0825  BP: (!) 142/48  Pulse: 78  Resp: 16  Temp: 98 F (36.7 C)  TempSrc: Oral  SpO2: 100%  Weight: 93 lb 1.6 oz (42.2 kg)    Height: 4\' 11"  (1.499 m)   Body mass index is 18.8 kg/m.  Physical Exam  GENERAL APPEARANCE: In no acute distress.  SKIN:  Skin is warm and dry.  MOUTH and THROAT: Lips are without lesions. Oral mucosa is moist and without lesions. Tongue is normal in shape, size, and color and without lesions RESPIRATORY: Breathing is even & unlabored, BS CTAB CARDIAC: RRR, no murmur,no extra heart sounds, no edema GI: Abdomen soft, normal BS, no masses, no tenderness EXTREMITIES:  Able to move X 4 extremities NEUROLOGICAL: There is no tremor. Speech is clear.Alert and oriented X 3.  PSYCHIATRIC:  Affect and behavior are appropriate   Labs reviewed: Recent Labs    05/15/18 0630 09/10/18 1534 12/20/18 2004  NA 139 139 137  K 4.6 4.7 4.7  CL 107 106 104  CO2 25 24 24   GLUCOSE 87 123* 97  BUN 53* 40* 49*  CREATININE 1.19* 1.21* 1.28*  CALCIUM 8.6* 9.1 9.3   Recent Labs    05/15/18 0630 09/10/18  1534  AST 39 33  ALT 36 27  ALKPHOS 87 88  BILITOT 1.0 1.0  PROT 6.0* 6.2*  ALBUMIN 3.2* 3.4*   Recent Labs    05/15/18 0630 09/10/18 1534 12/20/18 2004  WBC 9.1 9.1 10.8*  NEUTROABS 5.4 5.4  --   HGB 12.1 12.1 13.9  HCT 36.4 39.0 42.9  MCV 96.5 101.8* 97.9  PLT 213 213 254   Lab Results  Component Value Date   TSH 6.520 (H) 09/23/2015   Lab Results  Component Value Date   HGBA1C 5.9 09/23/2015   Lab Results  Component Value Date   CHOL 98 09/09/2018   HDL 55 09/09/2018   LDLCALC 38 09/09/2018   LDLDIRECT 60.0 08/23/2016   TRIG 26 09/09/2018   CHOLHDL 1.8 09/09/2018    Significant Diagnostic Results in last 30 days:  Dg Chest 2 View  Result Date: 12/20/2018 CLINICAL DATA:  Shortness of breath, chest pain EXAM: CHEST - 2 VIEW COMPARISON:  08/17/2017 FINDINGS: There is hyperinflation of the lungs compatible with COPD. Heart and mediastinal contours are within normal limits. No focal opacities or effusions. No acute bony abnormality. IMPRESSION: COPD.  No active  disease. Electronically Signed   By: Rolm Baptise M.D.   On: 12/20/2018 20:12    Assessment/Plan  1. Chronic combined systolic and diastolic heart failure (HCC) - no SOB, continue Coreg 3.125 mg 1 tab twice a day, Spironolactone 25 mg 1/2 tab = 12.5 mg daily, Lasix 20 mg 1 tab every other day and KCl ER 10 meq 1 tab daily  2. GERD without esophagitis -Continue ranitidine 75 mg 1 tab twice a day  3. CAD in native artery -No complaints of chest pain, continue NTG as needed, aspirin EC 81 mg 1 tab daily  4. PAF (paroxysmal atrial fibrillation) (HCC) -Rate controlled, continue Coreg 3.125 mg 1 tab twice a day   Family/ staff Communication: Discussed plan of care with resident.  Labs/tests ordered:  None  Goals of care:   Long-term care in ALF.   Durenda Age, NP [facility] and Adult Medicine [phone] (Monday-Friday 8:00 a.m. - 5:00 p.m.) [phone] (after hours)

## 2019-01-05 DIAGNOSIS — M6281 Muscle weakness (generalized): Secondary | ICD-10-CM | POA: Diagnosis not present

## 2019-01-05 DIAGNOSIS — L932 Other local lupus erythematosus: Secondary | ICD-10-CM | POA: Diagnosis not present

## 2019-01-05 DIAGNOSIS — Z9181 History of falling: Secondary | ICD-10-CM | POA: Diagnosis not present

## 2019-01-05 DIAGNOSIS — R630 Anorexia: Secondary | ICD-10-CM | POA: Diagnosis not present

## 2019-01-06 DIAGNOSIS — R630 Anorexia: Secondary | ICD-10-CM | POA: Diagnosis not present

## 2019-01-06 DIAGNOSIS — M6281 Muscle weakness (generalized): Secondary | ICD-10-CM | POA: Diagnosis not present

## 2019-01-06 DIAGNOSIS — Z9181 History of falling: Secondary | ICD-10-CM | POA: Diagnosis not present

## 2019-01-06 DIAGNOSIS — L932 Other local lupus erythematosus: Secondary | ICD-10-CM | POA: Diagnosis not present

## 2019-01-07 DIAGNOSIS — R630 Anorexia: Secondary | ICD-10-CM | POA: Diagnosis not present

## 2019-01-07 DIAGNOSIS — Z9181 History of falling: Secondary | ICD-10-CM | POA: Diagnosis not present

## 2019-01-07 DIAGNOSIS — M6281 Muscle weakness (generalized): Secondary | ICD-10-CM | POA: Diagnosis not present

## 2019-01-07 DIAGNOSIS — L932 Other local lupus erythematosus: Secondary | ICD-10-CM | POA: Diagnosis not present

## 2019-01-12 DIAGNOSIS — Z9181 History of falling: Secondary | ICD-10-CM | POA: Diagnosis not present

## 2019-01-12 DIAGNOSIS — M6281 Muscle weakness (generalized): Secondary | ICD-10-CM | POA: Diagnosis not present

## 2019-01-12 DIAGNOSIS — L932 Other local lupus erythematosus: Secondary | ICD-10-CM | POA: Diagnosis not present

## 2019-01-12 DIAGNOSIS — R630 Anorexia: Secondary | ICD-10-CM | POA: Diagnosis not present

## 2019-01-13 DIAGNOSIS — R630 Anorexia: Secondary | ICD-10-CM | POA: Diagnosis not present

## 2019-01-13 DIAGNOSIS — M6281 Muscle weakness (generalized): Secondary | ICD-10-CM | POA: Diagnosis not present

## 2019-01-13 DIAGNOSIS — Z9181 History of falling: Secondary | ICD-10-CM | POA: Diagnosis not present

## 2019-01-13 DIAGNOSIS — L932 Other local lupus erythematosus: Secondary | ICD-10-CM | POA: Diagnosis not present

## 2019-01-14 DIAGNOSIS — L932 Other local lupus erythematosus: Secondary | ICD-10-CM | POA: Diagnosis not present

## 2019-01-14 DIAGNOSIS — R630 Anorexia: Secondary | ICD-10-CM | POA: Diagnosis not present

## 2019-01-14 DIAGNOSIS — Z9181 History of falling: Secondary | ICD-10-CM | POA: Diagnosis not present

## 2019-01-14 DIAGNOSIS — M6281 Muscle weakness (generalized): Secondary | ICD-10-CM | POA: Diagnosis not present

## 2019-01-19 DIAGNOSIS — M6281 Muscle weakness (generalized): Secondary | ICD-10-CM | POA: Diagnosis not present

## 2019-01-19 DIAGNOSIS — L932 Other local lupus erythematosus: Secondary | ICD-10-CM | POA: Diagnosis not present

## 2019-01-19 DIAGNOSIS — Z9181 History of falling: Secondary | ICD-10-CM | POA: Diagnosis not present

## 2019-01-19 DIAGNOSIS — R630 Anorexia: Secondary | ICD-10-CM | POA: Diagnosis not present

## 2019-01-20 DIAGNOSIS — R630 Anorexia: Secondary | ICD-10-CM | POA: Diagnosis not present

## 2019-01-20 DIAGNOSIS — Z9181 History of falling: Secondary | ICD-10-CM | POA: Diagnosis not present

## 2019-01-20 DIAGNOSIS — L932 Other local lupus erythematosus: Secondary | ICD-10-CM | POA: Diagnosis not present

## 2019-01-20 DIAGNOSIS — M6281 Muscle weakness (generalized): Secondary | ICD-10-CM | POA: Diagnosis not present

## 2019-01-21 DIAGNOSIS — Z9181 History of falling: Secondary | ICD-10-CM | POA: Diagnosis not present

## 2019-01-21 DIAGNOSIS — L932 Other local lupus erythematosus: Secondary | ICD-10-CM | POA: Diagnosis not present

## 2019-01-21 DIAGNOSIS — R630 Anorexia: Secondary | ICD-10-CM | POA: Diagnosis not present

## 2019-01-21 DIAGNOSIS — M6281 Muscle weakness (generalized): Secondary | ICD-10-CM | POA: Diagnosis not present

## 2019-01-23 ENCOUNTER — Non-Acute Institutional Stay: Payer: Medicare Other | Admitting: Adult Health

## 2019-01-23 ENCOUNTER — Encounter: Payer: Self-pay | Admitting: Adult Health

## 2019-01-23 DIAGNOSIS — K219 Gastro-esophageal reflux disease without esophagitis: Secondary | ICD-10-CM | POA: Diagnosis not present

## 2019-01-23 NOTE — Progress Notes (Signed)
[facility]:  [facility] Room Number: [phone] Place of Service:  ALF [phone]) Provider:  Durenda Age, NP  Patient Care Team: Kirk Ruths, MD as PCP - General (Internal Medicine) Minna Merritts, MD as Consulting Physician (Cardiology) Gerlene Fee, NP as Nurse Practitioner (Geriatric Medicine)  Extended Emergency Contact Information Primary Emergency Contact: Rodriguez,Katherine Address: [address]          [place], [place] [address] [name] of [place] Phone: [phone] Work Phone: [phone] Mobile Phone: [phone] Relation: Daughter Secondary Emergency Contact: Rodriguez,Ray Address: [address]          [place]          [place], [place] [address] [place] Phone: [phone] Relation: Relative  Code Status:  FULL  Goals of care: Advanced Directive information Advanced Directives 01/23/2019  Does Patient Have a Medical Advance Directive? No  Type of Advance Directive -  Does patient want to make changes to medical advance directive? -  Copy of [place] in Chart? -  Would patient like information on creating a medical advance directive? No - Patient declined  Pre-existing out of facility DNR order (yellow form or pink MOST form) -     Chief Complaint  Patient presents with  . Acute Visit    Need to change medication for GERD    HPI:  Pt is a 85 y.o. female seen today for medication change. Pharmacy sent note about Ranitidine is currently on backorder. She was seen in her room today. She denies having any heartburns nor abdominal pain.   Past Medical History:  Diagnosis Date  . Coronary artery disease 03/06/13   90% prox LAD stenosis s/p DES  . Difficult intubation   . Dizziness   . GERD (gastroesophageal reflux disease)   . Hypotension   . Ischemic cardiomyopathy 03/09/13   s/p NSTEMI. EF 30-35%.  . MI (myocardial infarction) ([place])   . PAF (paroxysmal  atrial fibrillation) ([place]) 03/06/13   In setting of NSTEMI and reperfusion, no recurrence  . SVT (supraventricular tachycardia) (HCC)    Self-limited on outpatient cardiac monitor   Past Surgical History:  Procedure Laterality Date  . CESAREAN SECTION    . CORONARY ANGIOPLASTY WITH STENT PLACEMENT  02/2013   DES-mid LAD, mild LCx, RCA stenoses  . INTRAMEDULLARY (IM) NAIL INTERTROCHANTERIC Right 08/19/2017   Procedure: INTRAMEDULLARY (IM) NAIL INTERTROCHANTRIC;  Surgeon: Hessie Knows, MD;  Location: [facility] ORS;  Service: Orthopedics;  Laterality: Right;    Allergies  Allergen Reactions  . Penicillins     Has patient had a PCN reaction causing immediate rash, facial/tongue/throat swelling, SOB or lightheadedness with hypotension: Unknown Has patient had a PCN reaction causing severe rash involving mucus membranes or skin necrosis: Unknown Has patient had a PCN reaction that required hospitalization No Has patient had a PCN reaction occurring within the last 10 years: No If all of the above answers are "NO", then may proceed with Cephalosporin use.    Outpatient Encounter Medications as of 01/23/2019  Medication Sig  . Ascorbic Acid (VITAMIN C) 1000 MG tablet Take 1,000 mg by mouth daily.  Marland Kitchen aspirin EC 81 MG tablet Take 1 tablet (81 mg total) by mouth daily.  . carvedilol (COREG) 3.125 MG tablet TAKE ONE TABLET TWICE A DAY WITH MEALS.  Marland Kitchen Cholecalciferol 4000 units CAPS Take 1 capsule by mouth every other day.  . docusate sodium (COLACE) 100 MG capsule Take 100 mg  by mouth 2 (two) times daily as needed.  . loratadine (CLARITIN) 10 MG tablet Take 1 tablet (10 mg total) by mouth daily as needed for allergies.  . nitroGLYCERIN (NITROSTAT) 0.4 MG SL tablet Place 0.4 mg under the tongue every 5 (five) minutes as needed for chest pain.  . NON FORMULARY Diet Type: Regular  . Nutritional Supplements (ENSURE ENLIVE PO) Take 1 Bottle by mouth. Every other day  . ranitidine (ZANTAC) 75 MG tablet Take  75 mg 2 (two) times daily by mouth.   . spironolactone (ALDACTONE) 25 MG tablet Take 12.5 mg by mouth daily.  . vitamin B-12 (CYANOCOBALAMIN) 1000 MCG tablet Take 1,000 mcg by mouth daily.   . [DISCONTINUED] furosemide (LASIX) 20 MG tablet Take 1 tablet (20 mg total) by mouth every other day for 14 days.  . [DISCONTINUED] potassium chloride (K-DUR) 10 MEQ tablet Take 1 tablet (10 mEq total) by mouth every other day for 14 days.   No facility-administered encounter medications on file as of 01/23/2019.     Review of Systems  GENERAL: No change in appetite, no fatigue, no weight changes, no fever, chills or weakness MOUTH and THROAT: Denies oral discomfort, gingival pain or bleeding RESPIRATORY: no cough, SOB, DOE, wheezing, hemoptysis CARDIAC: No chest pain, edema or palpitations GI: No abdominal pain, diarrhea, constipation, heart burn, nausea or vomiting GU: Denies dysuria, frequency, hematuria, incontinence, or discharge NEUROLOGICAL: Denies dizziness, syncope, numbness, or headache PSYCHIATRIC: Denies feelings of depression or anxiety. No report of hallucinations, insomnia, paranoia, or agitation   Immunization History  Administered Date(s) Administered  . Influenza-Unspecified 07/30/2014, 07/17/2017, 08/28/2018  . Pneumococcal Conjugate-13 08/10/2014   Pertinent  Health Maintenance Due  Topic Date Due  . PNA vac Low Risk Adult (2 of 2 - PPSV23) 08/11/2015  . INFLUENZA VACCINE  Completed  . DEXA SCAN  Completed   Fall Risk  11/26/2016  Falls in the past year? No     Vitals:   01/23/19 1131  BP: (!) 157/52  Pulse: 75  Resp: 16  Temp: (!) 96.8 F (36 C)  TempSrc: Oral  SpO2: 100%  Weight: 93 lb 1.6 oz (42.2 kg)  Height: 4\' 11"  (1.499 m)   Body mass index is 18.8 kg/m.  Physical Exam  GENERAL APPEARANCE:  In no acute distress.  SKIN:  Skin is warm and dry.  MOUTH and THROAT: Lips are without lesions. Oral mucosa is moist and without lesions. Tongue is normal in  shape, size, and color and without lesions RESPIRATORY: Breathing is even & unlabored, BS CTAB CARDIAC: RRR, no murmur,no extra heart sounds, no edema GI: Abdomen soft, normal BS, no masses, no tenderness EXTREMITIES:  Able to move X 4 extremities NEUROLOGICAL: There is no tremor. Speech is clear. Alert and oriented X 3. PSYCHIATRIC:  Affect and behavior are appropriate   Labs reviewed: Recent Labs    05/15/18 0630 09/10/18 1534 12/20/18 2004  NA 139 139 137  K 4.6 4.7 4.7  CL 107 106 104  CO2 25 24 24   GLUCOSE 87 123* 97  BUN 53* 40* 49*  CREATININE 1.19* 1.21* 1.28*  CALCIUM 8.6* 9.1 9.3   Recent Labs    05/15/18 0630 09/10/18 1534  AST 39 33  ALT 36 27  ALKPHOS 87 88  BILITOT 1.0 1.0  PROT 6.0* 6.2*  ALBUMIN 3.2* 3.4*   Recent Labs    05/15/18 0630 09/10/18 1534 12/20/18 2004  WBC 9.1 9.1 10.8*  NEUTROABS 5.4 5.4  --  HGB 12.1 12.1 13.9  HCT 36.4 39.0 42.9  MCV 96.5 101.8* 97.9  PLT 213 213 254   Lab Results  Component Value Date   TSH 6.520 (H) 09/23/2015   Lab Results  Component Value Date   HGBA1C 5.9 09/23/2015   Lab Results  Component Value Date   CHOL 98 09/09/2018   HDL 55 09/09/2018   LDLCALC 38 09/09/2018   LDLDIRECT 60.0 08/23/2016   TRIG 26 09/09/2018   CHOLHDL 1.8 09/09/2018    Significant Diagnostic Results in last 30 days:  Assessment/Plan  1. GERD without esophagitis - stable, will discontinue Ranitidine and start Omeprazole 20 mg 1 capsule daily   Family/ staff Communication:  Discussed plan of care with resident.  Labs/tests ordered:  None  Goals of care:  Long-term care    Durenda Age, NP [facility] and Adult Medicine [phone] (Monday-Friday 8:00 a.m. - 5:00 p.m.) [phone] (after hours)

## 2019-01-26 DIAGNOSIS — L932 Other local lupus erythematosus: Secondary | ICD-10-CM | POA: Diagnosis not present

## 2019-01-26 DIAGNOSIS — Z9181 History of falling: Secondary | ICD-10-CM | POA: Diagnosis not present

## 2019-01-26 DIAGNOSIS — M6281 Muscle weakness (generalized): Secondary | ICD-10-CM | POA: Diagnosis not present

## 2019-01-26 DIAGNOSIS — R630 Anorexia: Secondary | ICD-10-CM | POA: Diagnosis not present

## 2019-01-27 DIAGNOSIS — L932 Other local lupus erythematosus: Secondary | ICD-10-CM | POA: Diagnosis not present

## 2019-01-27 DIAGNOSIS — M6281 Muscle weakness (generalized): Secondary | ICD-10-CM | POA: Diagnosis not present

## 2019-01-27 DIAGNOSIS — Z9181 History of falling: Secondary | ICD-10-CM | POA: Diagnosis not present

## 2019-01-27 DIAGNOSIS — R630 Anorexia: Secondary | ICD-10-CM | POA: Diagnosis not present

## 2019-01-28 ENCOUNTER — Encounter
Admission: RE | Admit: 2019-01-28 | Discharge: 2019-01-28 | Disposition: A | Discharge status: Home / Self Care | Payer: Medicare Other | Source: Ambulatory Visit | Attending: Internal Medicine | Admitting: Internal Medicine

## 2019-01-28 DIAGNOSIS — L932 Other local lupus erythematosus: Secondary | ICD-10-CM | POA: Diagnosis not present

## 2019-01-28 DIAGNOSIS — M6281 Muscle weakness (generalized): Secondary | ICD-10-CM | POA: Diagnosis not present

## 2019-01-28 DIAGNOSIS — R630 Anorexia: Secondary | ICD-10-CM | POA: Diagnosis not present

## 2019-01-28 DIAGNOSIS — Z9181 History of falling: Secondary | ICD-10-CM | POA: Diagnosis not present

## 2019-01-30 DIAGNOSIS — R630 Anorexia: Secondary | ICD-10-CM | POA: Diagnosis not present

## 2019-01-30 DIAGNOSIS — M6281 Muscle weakness (generalized): Secondary | ICD-10-CM | POA: Diagnosis not present

## 2019-01-30 DIAGNOSIS — L932 Other local lupus erythematosus: Secondary | ICD-10-CM | POA: Diagnosis not present

## 2019-01-30 DIAGNOSIS — Z9181 History of falling: Secondary | ICD-10-CM | POA: Diagnosis not present

## 2019-02-02 DIAGNOSIS — R630 Anorexia: Secondary | ICD-10-CM | POA: Diagnosis not present

## 2019-02-02 DIAGNOSIS — Z9181 History of falling: Secondary | ICD-10-CM | POA: Diagnosis not present

## 2019-02-02 DIAGNOSIS — L932 Other local lupus erythematosus: Secondary | ICD-10-CM | POA: Diagnosis not present

## 2019-02-02 DIAGNOSIS — M6281 Muscle weakness (generalized): Secondary | ICD-10-CM | POA: Diagnosis not present

## 2019-02-03 DIAGNOSIS — R630 Anorexia: Secondary | ICD-10-CM | POA: Diagnosis not present

## 2019-02-03 DIAGNOSIS — L932 Other local lupus erythematosus: Secondary | ICD-10-CM | POA: Diagnosis not present

## 2019-02-03 DIAGNOSIS — M6281 Muscle weakness (generalized): Secondary | ICD-10-CM | POA: Diagnosis not present

## 2019-02-03 DIAGNOSIS — Z9181 History of falling: Secondary | ICD-10-CM | POA: Diagnosis not present

## 2019-02-04 DIAGNOSIS — R630 Anorexia: Secondary | ICD-10-CM | POA: Diagnosis not present

## 2019-02-04 DIAGNOSIS — Z9181 History of falling: Secondary | ICD-10-CM | POA: Diagnosis not present

## 2019-02-04 DIAGNOSIS — M6281 Muscle weakness (generalized): Secondary | ICD-10-CM | POA: Diagnosis not present

## 2019-02-04 DIAGNOSIS — L932 Other local lupus erythematosus: Secondary | ICD-10-CM | POA: Diagnosis not present

## 2019-02-05 DIAGNOSIS — L932 Other local lupus erythematosus: Secondary | ICD-10-CM | POA: Diagnosis not present

## 2019-02-05 DIAGNOSIS — Z9181 History of falling: Secondary | ICD-10-CM | POA: Diagnosis not present

## 2019-02-05 DIAGNOSIS — R630 Anorexia: Secondary | ICD-10-CM | POA: Diagnosis not present

## 2019-02-05 DIAGNOSIS — M6281 Muscle weakness (generalized): Secondary | ICD-10-CM | POA: Diagnosis not present

## 2019-02-09 DIAGNOSIS — L932 Other local lupus erythematosus: Secondary | ICD-10-CM | POA: Diagnosis not present

## 2019-02-09 DIAGNOSIS — R630 Anorexia: Secondary | ICD-10-CM | POA: Diagnosis not present

## 2019-02-09 DIAGNOSIS — M6281 Muscle weakness (generalized): Secondary | ICD-10-CM | POA: Diagnosis not present

## 2019-02-09 DIAGNOSIS — Z9181 History of falling: Secondary | ICD-10-CM | POA: Diagnosis not present

## 2019-02-10 DIAGNOSIS — Z9181 History of falling: Secondary | ICD-10-CM | POA: Diagnosis not present

## 2019-02-10 DIAGNOSIS — M6281 Muscle weakness (generalized): Secondary | ICD-10-CM | POA: Diagnosis not present

## 2019-02-10 DIAGNOSIS — L932 Other local lupus erythematosus: Secondary | ICD-10-CM | POA: Diagnosis not present

## 2019-02-10 DIAGNOSIS — R630 Anorexia: Secondary | ICD-10-CM | POA: Diagnosis not present

## 2019-02-11 DIAGNOSIS — L932 Other local lupus erythematosus: Secondary | ICD-10-CM | POA: Diagnosis not present

## 2019-02-11 DIAGNOSIS — R630 Anorexia: Secondary | ICD-10-CM | POA: Diagnosis not present

## 2019-02-11 DIAGNOSIS — M6281 Muscle weakness (generalized): Secondary | ICD-10-CM | POA: Diagnosis not present

## 2019-02-11 DIAGNOSIS — Z9181 History of falling: Secondary | ICD-10-CM | POA: Diagnosis not present

## 2019-02-12 DIAGNOSIS — L932 Other local lupus erythematosus: Secondary | ICD-10-CM | POA: Diagnosis not present

## 2019-02-12 DIAGNOSIS — M6281 Muscle weakness (generalized): Secondary | ICD-10-CM | POA: Diagnosis not present

## 2019-02-12 DIAGNOSIS — R630 Anorexia: Secondary | ICD-10-CM | POA: Diagnosis not present

## 2019-02-12 DIAGNOSIS — Z9181 History of falling: Secondary | ICD-10-CM | POA: Diagnosis not present

## 2019-02-16 DIAGNOSIS — R630 Anorexia: Secondary | ICD-10-CM | POA: Diagnosis not present

## 2019-02-16 DIAGNOSIS — M6281 Muscle weakness (generalized): Secondary | ICD-10-CM | POA: Diagnosis not present

## 2019-02-16 DIAGNOSIS — Z9181 History of falling: Secondary | ICD-10-CM | POA: Diagnosis not present

## 2019-02-16 DIAGNOSIS — L932 Other local lupus erythematosus: Secondary | ICD-10-CM | POA: Diagnosis not present

## 2019-02-16 NOTE — Progress Notes (Signed)
Virtual Visit via Video Note   This visit type was conducted due to national recommendations for restrictions regarding the COVID-19 Pandemic (e.g. social distancing) in an effort to limit this patient's exposure and mitigate transmission in our community.  Due to her co-morbid illnesses, this patient is at least at moderate risk for complications without adequate follow up.  This format is felt to be most appropriate for this patient at this time.  All issues noted in this document were discussed and addressed.  A limited physical exam was performed with this format.  Please refer to the patient's chart for her consent to telehealth for [facility].   I connected with  Linda Hart on 02/16/19 by a video enabled telemedicine application and verified that I am speaking with the correct person using two identifiers. I discussed the limitations of evaluation and management by telemedicine. The patient expressed understanding and agreed to proceed.   Evaluation Performed:  Follow-up visit  Date:  02/16/2019   ID:  Linda Hart [birth year], MRN [id number]  Patient Location:  [place] [address]   Provider location:   [facility], [place] office  PCP:  Kirk Ruths, MD  Cardiologist:  [facility]   Chief Complaint: Leg weakness, palpitations    History of Present Illness:    Linda Hart is a 85 y.o. female who presents via audio/video conferencing for a telehealth visit today.   The patient does not symptoms concerning for COVID-19 infection (fever, chills, cough, or new SHORTNESS OF BREATH).   Patient has a past medical history of CAD (NSTEMI 03/06/13 s/p DES-LAD),  PAF (in setting of NSTEMI and reperfusion), not on anticoagulation given frequent falls ischemic cardiomyopathy (EF 30-35% on 03/09/13 echo),  SVT (self-limited on OP monitor),  GERD, remote h/o lightheadedness and syncope who was re-admitted to  [facility] with worsening DOE/SOB may 2014.   She lives at the [place] EF 45% She presents today for follow-up of her coronary artery disease  In follow-up today she reports that she is doing well [facility] [place] Nurse set up a video call with her today,  Walks up and down hallway No SOB or chest pain No swelling Only complaint is occasional palpitations Seems to come on if she does anything, but symptoms are rare does not bother her and go away relatively quickly  BP: 149/48 Pulse: 62 Weight 92, down from 62 Feels her weight is stable but numbers show a trend downward  Lab work reviewed CR 1.28 BUN 49 Trend up in her renal function, she reports that she only drinks about 1 glass of water a day Does not like to go to the bathroom a lot  Nurses were present in the room and provided the vitals   Previous records reviewed Recent fall,  hip fx 07/2017 Required surgery Subsequent Anemia UTI, hypotension in the hospital treated with IV antibiotics then Keflex Slow recovery in rehab per the daughter Reports that she is doing much better, ambulating with walker with no assistance Lost 10 pounds during October, trying to get this weight back  Had severe leg swelling  during hospitalization this has slowly recovered Wears compression hose Anemia resolved Aspirin and brilinta were held at discharge and not restarted  Prior history of falls Fall 2 x, kitchen in MAY 2018, And getting gout of the bath tub the same week   She was admitted 03/05/13 with NSTEMI.  cardiac catheterization revealing  tubular 90% prox extending to mid LAD stenosis s/p DES, mild atheresclerosis in LCx, RCA.  Post-cath echo showed EF 30-35%, severe anterior, septal and apical HK, mild TR/MR, mildly elevated EF described as reduced. DAPT- ASA/Brilinta x 12 months recommended.  Cr did bump to 1.5-1.6 suspected to be secondary to contrast induced precluding the addition of ACEi/ARB.  Limited repeat echo indicated LVEF 30-35%. She declined LifeVest.   After discharge from the hospital, creatinine was >2, Lasix was held. Creatinine in June 2014 improved to 1.5.  Previous event monitor showed frequent supraventricular ectopy, rare PVCs.  She had short runs of SVT, the longest was 11 beats. She had 46 short runs. Uncertain if she was symptomatic during these short runs of SVT  Echocardiogram may 2014 with ejection fraction 30-35%, mild to moderate pulmonary hypertension    Prior CV studies:   The following studies were reviewed today:    Past Medical History:  Diagnosis Date  . Coronary artery disease 03/06/13   90% prox LAD stenosis s/p DES  . Difficult intubation   . Dizziness   . GERD (gastroesophageal reflux disease)   . Hypotension   . Ischemic cardiomyopathy 03/09/13   s/p NSTEMI. EF 30-35%.  . MI (myocardial infarction) ([place])   . PAF (paroxysmal atrial fibrillation) ([place]) 03/06/13   In setting of NSTEMI and reperfusion, no recurrence  . SVT (supraventricular tachycardia) (HCC)    Self-limited on outpatient cardiac monitor   Past Surgical History:  Procedure Laterality Date  . CESAREAN SECTION    . CORONARY ANGIOPLASTY WITH STENT PLACEMENT  02/2013   DES-mid LAD, mild LCx, RCA stenoses  . INTRAMEDULLARY (IM) NAIL INTERTROCHANTERIC Right 08/19/2017   Procedure: INTRAMEDULLARY (IM) NAIL INTERTROCHANTRIC;  Surgeon: Hessie Knows, MD;  Location: [facility] ORS;  Service: Orthopedics;  Laterality: Right;     No outpatient medications have been marked as taking for the 02/17/19 encounter (Appointment) with Minna Merritts, MD.     Allergies:   Penicillins   Social History   Tobacco Use  . Smoking status: Never Smoker  . Smokeless tobacco: Never Used  Substance Use Topics  . Alcohol use: No    Comment: occasional  . Drug use: No     Current Outpatient Medications on File Prior to Visit  Medication Sig Dispense Refill  . Ascorbic Acid (VITAMIN  C) 1000 MG tablet Take 1,000 mg by mouth daily.    Marland Kitchen aspirin EC 81 MG tablet Take 1 tablet (81 mg total) by mouth daily. 90 tablet 3  . carvedilol (COREG) 3.125 MG tablet TAKE ONE TABLET TWICE A DAY WITH MEALS. 60 tablet 11  . Cholecalciferol 4000 units CAPS Take 1 capsule by mouth every other day.    . docusate sodium (COLACE) 100 MG capsule Take 100 mg by mouth 2 (two) times daily as needed.    . loratadine (CLARITIN) 10 MG tablet Take 1 tablet (10 mg total) by mouth daily as needed for allergies. 30 tablet 11  . nitroGLYCERIN (NITROSTAT) 0.4 MG SL tablet Place 0.4 mg under the tongue every 5 (five) minutes as needed for chest pain.    . NON FORMULARY Diet Type: Regular    . Nutritional Supplements (ENSURE ENLIVE PO) Take 1 Bottle by mouth. Every other day    . ranitidine (ZANTAC) 75 MG tablet Take 75 mg 2 (two) times daily by mouth.     . spironolactone (ALDACTONE) 25 MG tablet Take 12.5 mg by mouth daily.    . vitamin B-12 (CYANOCOBALAMIN)  1000 MCG tablet Take 1,000 mcg by mouth daily.      No current facility-administered medications on file prior to visit.      Family Hx: The patient's family history includes Heart attack in her father; Heart failure in her father and mother.  ROS:   Please see the history of present illness.    Review of Systems  Constitutional: Negative.   Respiratory: Negative.   Cardiovascular: Positive for palpitations.  Gastrointestinal: Negative.   Musculoskeletal: Negative.        Gait instability  Neurological: Negative.   Psychiatric/Behavioral: Negative.   All other systems reviewed and are negative.    Labs/Other Tests and Data Reviewed:    Recent Labs: 09/10/2018: ALT 27 12/20/2018: BUN 49; Creatinine, Ser 1.28; Hemoglobin 13.9; Platelets 254; Potassium 4.7; Sodium 137   Recent Lipid Panel Lab Results  Component Value Date/Time   CHOL 98 09/09/2018 06:00 AM   CHOL 121 03/06/2013 03:43 AM   TRIG 26 09/09/2018 06:00 AM   TRIG 45 03/06/2013  03:43 AM   HDL 55 09/09/2018 06:00 AM   HDL 60 03/06/2013 03:43 AM   CHOLHDL 1.8 09/09/2018 06:00 AM   LDLCALC 38 09/09/2018 06:00 AM   LDLCALC 52 03/06/2013 03:43 AM   LDLDIRECT 60.0 08/23/2016 03:52 PM    Wt Readings from Last 3 Encounters:  01/23/19 93 lb 1.6 oz (42.2 kg)  12/31/18 93 lb 1.6 oz (42.2 kg)  12/20/18 92 lb (41.7 kg)     Exam:    Vital Signs: Vital signs may also be detailed in the HPI There were no vitals taken for this visit.  Wt Readings from Last 3 Encounters:  01/23/19 93 lb 1.6 oz (42.2 kg)  12/31/18 93 lb 1.6 oz (42.2 kg)  12/20/18 92 lb (41.7 kg)   Temp Readings from Last 3 Encounters:  01/23/19 (!) 96.8 F (36 C) (Oral)  12/31/18 98 F (36.7 C) (Oral)  12/20/18 98.5 F (36.9 C) (Oral)   BP Readings from Last 3 Encounters:  01/23/19 (!) 157/52  12/31/18 (!) 142/48  12/21/18 (!) 161/54   Pulse Readings from Last 3 Encounters:  01/23/19 75  12/31/18 78  12/20/18 90    Vitals today  BP: 149/48 Pulse: 62 Respirations 16 Weight 92, down from 96 last year  Well nourished, well developed female in no acute distress. Constitutional:  oriented to person, place, and time. No distress.  Head: Normocephalic and atraumatic.  Eyes:  no discharge. No scleral icterus.  Neck: Normal range of motion. Neck supple.  Pulmonary/Chest: No audible wheezing, no distress, appears comfortable Musculoskeletal: Normal range of motion.  no  tenderness or deformity.  Neurological:   Coordination normal. Full exam not performed Skin:  No rash Psychiatric:  normal mood and affect. behavior is normal. Thought content normal.    ASSESSMENT & PLAN:    SVT (supraventricular tachycardia) (HCC) Having occasional palpitations, we did discuss that we could increase her carvedilol if symptoms get worse She does not want to change medications at this time  PAF (paroxysmal atrial fibrillation) (HCC) For the most part sounds asymptomatic with rare palpitations Not a  good candidate for anticoagulation given high fall risk  Cardiomyopathy, ischemic  Appears relatively euvolemic  In fact is not drinking much, trend up in her BUN and creatinine with no significant swelling  atherosclerosis of native coronary artery of native heart with stable angina pectoris (HCC) Currently with no symptoms of angina. No further workup at this time. Continue current medication  regimen.  Essential hypertension Blood pressure running mildly elevated, she does not want medication changes at this time Asymptomatic, will leave her alone  Systolic and diastolic CHF, acute on chronic (HCC) Appears relatively euvolemic As detailed above does not drink much fluids in fact looking prerenal No further work-up at this time    COVID-19 Education: The signs and symptoms of COVID-19 were discussed with the patient and how to seek care for testing (follow up with PCP or arrange E-visit).  The importance of social distancing was discussed today.  Patient Risk:   After full review of this patients clinical status, I feel that they are at least moderate risk at this time.  Time:   Today, I have spent 25 minutes with the patient with telehealth technology discussing the cardiac and medical problems/diagnoses detailed above   10 min spent reviewing the chart prior to patient visit today   Medication Adjustments/Labs and Tests Ordered: Current medicines are reviewed at length with the patient today.  Concerns regarding medicines are outlined above.   Tests Ordered: No tests ordered   Medication Changes: No changes made   Disposition: Follow-up in 6 months   Signed, Linda Rogue, MD  02/16/2019 6:24 PM    [place] Office [address] [place] [address], [place], [place] [address]

## 2019-02-17 ENCOUNTER — Other Ambulatory Visit: Payer: Self-pay

## 2019-02-17 ENCOUNTER — Telehealth (INDEPENDENT_AMBULATORY_CARE_PROVIDER_SITE_OTHER): Payer: Medicare Other | Admitting: Cardiovascular Disease

## 2019-02-17 DIAGNOSIS — I471 Supraventricular tachycardia: Secondary | ICD-10-CM

## 2019-02-17 DIAGNOSIS — I480 Paroxysmal atrial fibrillation: Secondary | ICD-10-CM | POA: Diagnosis not present

## 2019-02-17 DIAGNOSIS — L932 Other local lupus erythematosus: Secondary | ICD-10-CM | POA: Diagnosis not present

## 2019-02-17 DIAGNOSIS — I5043 Acute on chronic combined systolic (congestive) and diastolic (congestive) heart failure: Secondary | ICD-10-CM

## 2019-02-17 DIAGNOSIS — R630 Anorexia: Secondary | ICD-10-CM | POA: Diagnosis not present

## 2019-02-17 DIAGNOSIS — I110 Hypertensive heart disease with heart failure: Secondary | ICD-10-CM | POA: Diagnosis not present

## 2019-02-17 DIAGNOSIS — I25118 Atherosclerotic heart disease of native coronary artery with other forms of angina pectoris: Secondary | ICD-10-CM

## 2019-02-17 DIAGNOSIS — I255 Ischemic cardiomyopathy: Secondary | ICD-10-CM | POA: Diagnosis not present

## 2019-02-17 DIAGNOSIS — M6281 Muscle weakness (generalized): Secondary | ICD-10-CM | POA: Diagnosis not present

## 2019-02-17 DIAGNOSIS — I2510 Atherosclerotic heart disease of native coronary artery without angina pectoris: Secondary | ICD-10-CM

## 2019-02-17 DIAGNOSIS — Z9181 History of falling: Secondary | ICD-10-CM | POA: Diagnosis not present

## 2019-02-17 DIAGNOSIS — I10 Essential (primary) hypertension: Secondary | ICD-10-CM

## 2019-02-17 DIAGNOSIS — I5042 Chronic combined systolic (congestive) and diastolic (congestive) heart failure: Secondary | ICD-10-CM

## 2019-02-17 NOTE — Patient Instructions (Signed)

## 2019-02-18 DIAGNOSIS — L932 Other local lupus erythematosus: Secondary | ICD-10-CM | POA: Diagnosis not present

## 2019-02-18 DIAGNOSIS — Z9181 History of falling: Secondary | ICD-10-CM | POA: Diagnosis not present

## 2019-02-18 DIAGNOSIS — M6281 Muscle weakness (generalized): Secondary | ICD-10-CM | POA: Diagnosis not present

## 2019-02-18 DIAGNOSIS — R630 Anorexia: Secondary | ICD-10-CM | POA: Diagnosis not present

## 2019-02-19 DIAGNOSIS — R630 Anorexia: Secondary | ICD-10-CM | POA: Diagnosis not present

## 2019-02-19 DIAGNOSIS — L932 Other local lupus erythematosus: Secondary | ICD-10-CM | POA: Diagnosis not present

## 2019-02-19 DIAGNOSIS — M6281 Muscle weakness (generalized): Secondary | ICD-10-CM | POA: Diagnosis not present

## 2019-02-19 DIAGNOSIS — Z9181 History of falling: Secondary | ICD-10-CM | POA: Diagnosis not present

## 2019-02-23 DIAGNOSIS — M6281 Muscle weakness (generalized): Secondary | ICD-10-CM | POA: Diagnosis not present

## 2019-02-23 DIAGNOSIS — Z9181 History of falling: Secondary | ICD-10-CM | POA: Diagnosis not present

## 2019-02-23 DIAGNOSIS — L932 Other local lupus erythematosus: Secondary | ICD-10-CM | POA: Diagnosis not present

## 2019-02-23 DIAGNOSIS — R630 Anorexia: Secondary | ICD-10-CM | POA: Diagnosis not present

## 2019-02-24 DIAGNOSIS — L932 Other local lupus erythematosus: Secondary | ICD-10-CM | POA: Diagnosis not present

## 2019-02-24 DIAGNOSIS — R630 Anorexia: Secondary | ICD-10-CM | POA: Diagnosis not present

## 2019-02-24 DIAGNOSIS — Z9181 History of falling: Secondary | ICD-10-CM | POA: Diagnosis not present

## 2019-02-24 DIAGNOSIS — M6281 Muscle weakness (generalized): Secondary | ICD-10-CM | POA: Diagnosis not present

## 2019-02-25 DIAGNOSIS — L932 Other local lupus erythematosus: Secondary | ICD-10-CM | POA: Diagnosis not present

## 2019-02-25 DIAGNOSIS — M6281 Muscle weakness (generalized): Secondary | ICD-10-CM | POA: Diagnosis not present

## 2019-02-25 DIAGNOSIS — Z9181 History of falling: Secondary | ICD-10-CM | POA: Diagnosis not present

## 2019-02-25 DIAGNOSIS — R630 Anorexia: Secondary | ICD-10-CM | POA: Diagnosis not present

## 2019-02-27 ENCOUNTER — Encounter
Admission: RE | Admit: 2019-02-27 | Discharge: 2019-02-27 | Disposition: A | Discharge status: Home / Self Care | Payer: Medicare Other | Source: Ambulatory Visit | Attending: Internal Medicine | Admitting: Internal Medicine

## 2019-03-02 DIAGNOSIS — L932 Other local lupus erythematosus: Secondary | ICD-10-CM | POA: Diagnosis not present

## 2019-03-02 DIAGNOSIS — Z9181 History of falling: Secondary | ICD-10-CM | POA: Diagnosis not present

## 2019-03-02 DIAGNOSIS — R630 Anorexia: Secondary | ICD-10-CM | POA: Diagnosis not present

## 2019-03-02 DIAGNOSIS — M6281 Muscle weakness (generalized): Secondary | ICD-10-CM | POA: Diagnosis not present

## 2019-03-03 DIAGNOSIS — M6281 Muscle weakness (generalized): Secondary | ICD-10-CM | POA: Diagnosis not present

## 2019-03-03 DIAGNOSIS — R630 Anorexia: Secondary | ICD-10-CM | POA: Diagnosis not present

## 2019-03-03 DIAGNOSIS — Z9181 History of falling: Secondary | ICD-10-CM | POA: Diagnosis not present

## 2019-03-03 DIAGNOSIS — L932 Other local lupus erythematosus: Secondary | ICD-10-CM | POA: Diagnosis not present

## 2019-03-04 DIAGNOSIS — M6281 Muscle weakness (generalized): Secondary | ICD-10-CM | POA: Diagnosis not present

## 2019-03-04 DIAGNOSIS — Z9181 History of falling: Secondary | ICD-10-CM | POA: Diagnosis not present

## 2019-03-04 DIAGNOSIS — L932 Other local lupus erythematosus: Secondary | ICD-10-CM | POA: Diagnosis not present

## 2019-03-04 DIAGNOSIS — R630 Anorexia: Secondary | ICD-10-CM | POA: Diagnosis not present

## 2019-03-09 DIAGNOSIS — Z9181 History of falling: Secondary | ICD-10-CM | POA: Diagnosis not present

## 2019-03-09 DIAGNOSIS — R630 Anorexia: Secondary | ICD-10-CM | POA: Diagnosis not present

## 2019-03-09 DIAGNOSIS — M6281 Muscle weakness (generalized): Secondary | ICD-10-CM | POA: Diagnosis not present

## 2019-03-09 DIAGNOSIS — L932 Other local lupus erythematosus: Secondary | ICD-10-CM | POA: Diagnosis not present

## 2019-03-10 DIAGNOSIS — Z9181 History of falling: Secondary | ICD-10-CM | POA: Diagnosis not present

## 2019-03-10 DIAGNOSIS — M6281 Muscle weakness (generalized): Secondary | ICD-10-CM | POA: Diagnosis not present

## 2019-03-10 DIAGNOSIS — L932 Other local lupus erythematosus: Secondary | ICD-10-CM | POA: Diagnosis not present

## 2019-03-10 DIAGNOSIS — R630 Anorexia: Secondary | ICD-10-CM | POA: Diagnosis not present

## 2019-03-11 DIAGNOSIS — R630 Anorexia: Secondary | ICD-10-CM | POA: Diagnosis not present

## 2019-03-11 DIAGNOSIS — L932 Other local lupus erythematosus: Secondary | ICD-10-CM | POA: Diagnosis not present

## 2019-03-11 DIAGNOSIS — Z9181 History of falling: Secondary | ICD-10-CM | POA: Diagnosis not present

## 2019-03-11 DIAGNOSIS — M6281 Muscle weakness (generalized): Secondary | ICD-10-CM | POA: Diagnosis not present

## 2019-03-12 ENCOUNTER — Encounter: Payer: Self-pay | Admitting: Adult Health

## 2019-03-12 ENCOUNTER — Non-Acute Institutional Stay: Payer: Medicare Other | Admitting: Adult Health

## 2019-03-12 DIAGNOSIS — I5042 Chronic combined systolic (congestive) and diastolic (congestive) heart failure: Secondary | ICD-10-CM

## 2019-03-12 DIAGNOSIS — E538 Deficiency of other specified B group vitamins: Secondary | ICD-10-CM | POA: Diagnosis not present

## 2019-03-12 DIAGNOSIS — E559 Vitamin D deficiency, unspecified: Secondary | ICD-10-CM

## 2019-03-12 DIAGNOSIS — K219 Gastro-esophageal reflux disease without esophagitis: Secondary | ICD-10-CM

## 2019-03-12 DIAGNOSIS — R630 Anorexia: Secondary | ICD-10-CM | POA: Diagnosis not present

## 2019-03-12 DIAGNOSIS — I2510 Atherosclerotic heart disease of native coronary artery without angina pectoris: Secondary | ICD-10-CM | POA: Diagnosis not present

## 2019-03-12 DIAGNOSIS — Z9181 History of falling: Secondary | ICD-10-CM | POA: Diagnosis not present

## 2019-03-12 DIAGNOSIS — M6281 Muscle weakness (generalized): Secondary | ICD-10-CM | POA: Diagnosis not present

## 2019-03-12 DIAGNOSIS — L932 Other local lupus erythematosus: Secondary | ICD-10-CM | POA: Diagnosis not present

## 2019-03-12 NOTE — Progress Notes (Signed)
Location:  [facility] Room Number: [place]:  ALF [phone]) Provider:  Durenda Age, DNP, FNP-BC  Patient Care Team: Kirk Ruths, MD as PCP - General (Internal Medicine) Minna Merritts, MD as Consulting Physician (Cardiology) Gerlene Fee, NP as Nurse Practitioner (Geriatric Medicine)  Extended Emergency Contact Information Primary Emergency Contact: Rodriguez,Katherine Address: [address]          [place], [place] [address] [name] Phone: [phone] Work Phone: [phone] Mobile Phone: [phone] Relation: Daughter Secondary Emergency Contact: Rodriguez,Ray Address: [address]          [place]          [place], [place] [address] [place] Phone: [phone] Relation: Relative  Code Status:  Full Code  Goals of care: Advanced Directive information Advanced Directives 01/23/2019  Does Patient Have a Medical Advance Directive? No  Type of Advance Directive -  Does patient want to make changes to medical advance directive? -  Copy of [place] in Chart? -  Would patient like information on creating a medical advance directive? No - Patient declined  Pre-existing out of facility DNR order (yellow form or pink MOST form) -     Chief Complaint  Patient presents with  . Medical Management of Chronic Issues    Routine [facility] visit    HPI:  Pt is a 85 y.o. female seen today for medical management of chronic diseases.  She is a long-term resident of [facility]. She has a PMH of lupus, ischemic cardiomyopathy, COPD, chronic combined systolic and diastolic congestive heart failure, HLD, and GERD. She was seen in her room today. No complaints of chest pains. She gained 0.5 lb in 1 month.   Past Medical History:  Diagnosis Date  . Coronary artery disease 03/06/13   90% prox LAD stenosis s/p DES  . Difficult intubation   . Dizziness   . GERD  (gastroesophageal reflux disease)   . Hypotension   . Ischemic cardiomyopathy 03/09/13   s/p NSTEMI. EF 30-35%.  . MI (myocardial infarction) ([place])   . PAF (paroxysmal atrial fibrillation) ([place]) 03/06/13   In setting of NSTEMI and reperfusion, no recurrence  . SVT (supraventricular tachycardia) (HCC)    Self-limited on outpatient cardiac monitor   Past Surgical History:  Procedure Laterality Date  . CESAREAN SECTION    . CORONARY ANGIOPLASTY WITH STENT PLACEMENT  02/2013   DES-mid LAD, mild LCx, RCA stenoses  . INTRAMEDULLARY (IM) NAIL INTERTROCHANTERIC Right 08/19/2017   Procedure: INTRAMEDULLARY (IM) NAIL INTERTROCHANTRIC;  Surgeon: Hessie Knows, MD;  Location: [facility] ORS;  Service: Orthopedics;  Laterality: Right;    Allergies  Allergen Reactions  . Penicillins     Has patient had a PCN reaction causing immediate rash, facial/tongue/throat swelling, SOB or lightheadedness with hypotension: Unknown Has patient had a PCN reaction causing severe rash involving mucus membranes or skin necrosis: Unknown Has patient had a PCN reaction that required hospitalization No Has patient had a PCN reaction occurring within the last 10 years: No If all of the above answers are "NO", then may proceed with Cephalosporin use.    Outpatient Encounter Medications as of 03/12/2019  Medication Sig  . Ascorbic Acid (VITAMIN C) 1000 MG tablet Take 1,000 mg by mouth daily.  Marland Kitchen aspirin EC 81 MG tablet Take 1 tablet (81 mg total) by mouth daily.  . carvedilol (COREG) 3.125 MG tablet TAKE ONE TABLET TWICE  A DAY WITH MEALS.  Marland Kitchen Cholecalciferol 4000 units CAPS Take 1 capsule by mouth every other day.  . docusate sodium (COLACE) 100 MG capsule Take 100 mg by mouth 2 (two) times daily as needed.  . loratadine (CLARITIN) 10 MG tablet Take 1 tablet (10 mg total) by mouth daily as needed for allergies.  . nitroGLYCERIN (NITROSTAT) 0.4 MG SL tablet Place 0.4 mg under the tongue every 5 (five) minutes as needed for chest  pain.  . NON FORMULARY Diet Type: Regular  . Nutritional Supplements (ENSURE ENLIVE PO) Take 1 Bottle by mouth. Every other day  . omeprazole (PRILOSEC) 20 MG capsule Take 20 mg by mouth daily.  Marland Kitchen spironolactone (ALDACTONE) 25 MG tablet Take 12.5 mg by mouth daily. 0.5 tablet to = 12.5 mg  . vitamin B-12 (CYANOCOBALAMIN) 1000 MCG tablet Take 1,000 mcg by mouth daily.   . [DISCONTINUED] ranitidine (ZANTAC) 75 MG tablet Take 75 mg 2 (two) times daily by mouth.    No facility-administered encounter medications on file as of 03/12/2019.     Review of Systems  GENERAL: No change in appetite, no fatigue, no weight changes, no fever, chills or weakness MOUTH and THROAT: Denies oral discomfort, gingival pain or bleeding, pain from teeth or hoarseness   RESPIRATORY: no cough, SOB, DOE, wheezing, hemoptysis CARDIAC: No chest pain, edema or palpitations GI: No abdominal pain, diarrhea, constipation, heart burn, nausea or vomiting GU: Denies dysuria, frequency, hematuria, incontinence, or discharge NEUROLOGICAL: Denies dizziness, syncope, numbness, or headache PSYCHIATRIC: Denies feelings of depression or anxiety. No report of hallucinations, insomnia, paranoia, or agitation    Immunization History  Administered Date(s) Administered  . Influenza-Unspecified 07/30/2014, 07/17/2017, 08/28/2018  . Pneumococcal Conjugate-13 08/10/2014   Pertinent  Health Maintenance Due  Topic Date Due  . PNA vac Low Risk Adult (2 of 2 - PPSV23) 08/11/2015  . INFLUENZA VACCINE  05/30/2019  . DEXA SCAN  Completed   Fall Risk  11/26/2016  Falls in the past year? No     Vitals:   03/12/19 1414  BP: 128/70  Pulse: 83  Resp: 18  Temp: (!) 96.7 F (35.9 C)  TempSrc: Oral  SpO2: 98%  Weight: 91 lb 3.2 oz (41.4 kg)  Height: 4\' 11"  (1.499 m)   Body mass index is 18.42 kg/m.  Physical Exam  GENERAL APPEARANCE:  In no acute distress. Normal body habitus SKIN:  Skin is warm and dry.  MOUTH and THROAT:  Lips are without lesions. Oral mucosa is moist and without lesions. Tongue is normal in shape, size, and color and without lesions RESPIRATORY: Breathing is even & unlabored, BS CTAB CARDIAC: RRR, no murmur,no extra heart sounds, no edema GI: Abdomen soft, normal BS, no masses, no tenderness EXTREMITIES:  Able to move X 4 extremities NEUROLOGICAL: There is no tremor. Speech is clear. Alert and oriented X 3. PSYCHIATRIC:  Affect and behavior are appropriate   Labs reviewed: Recent Labs    05/15/18 0630 09/10/18 1534 12/20/18 2004  NA 139 139 137  K 4.6 4.7 4.7  CL 107 106 104  CO2 25 24 24   GLUCOSE 87 123* 97  BUN 53* 40* 49*  CREATININE 1.19* 1.21* 1.28*  CALCIUM 8.6* 9.1 9.3   Recent Labs    05/15/18 0630 09/10/18 1534  AST 39 33  ALT 36 27  ALKPHOS 87 88  BILITOT 1.0 1.0  PROT 6.0* 6.2*  ALBUMIN 3.2* 3.4*   Recent Labs    05/15/18 0630 09/10/18 1534 12/20/18  2004  WBC 9.1 9.1 10.8*  NEUTROABS 5.4 5.4  --   HGB 12.1 12.1 13.9  HCT 36.4 39.0 42.9  MCV 96.5 101.8* 97.9  PLT 213 213 254   Lab Results  Component Value Date   TSH 6.520 (H) 09/23/2015   Lab Results  Component Value Date   HGBA1C 5.9 09/23/2015   Lab Results  Component Value Date   CHOL 98 09/09/2018   HDL 55 09/09/2018   LDLCALC 38 09/09/2018   LDLDIRECT 60.0 08/23/2016   TRIG 26 09/09/2018   CHOLHDL 1.8 09/09/2018    Assessment/Plan   1. GERD without esophagitis - Continue omeprazole 20 mg 1 capsule daily  2. Chronic combined systolic and diastolic heart failure (HCC) -No SOB nor edema, continue Coreg 3.125 mg 1 tab twice a day and Spironolactone 25 mg 1/2 tab = 12.5 mg daily  3. CAD in native artery -No complaints of chest pain, continue NTG as needed and aspirin EC 81 mg 1 tab daily  4. Vitamin D deficiency -Continue vitamin D 3 4000 units 1 capsule daily  5. Vitamin B12 deficiency Lab Results  Component Value Date   VITAMINB12 505 11/05/2016  -Continue vitamin B12 1000  mcg 1 tab daily     Family/ staff Communication: Discussed plan of care with resident.  Labs/tests ordered:  None  Goals of care:   Long-term care in ALF.    Durenda Age, DNP, FNP-BC [facility] and Adult Medicine [phone] (Monday-Friday 8:00 a.m. - 5:00 p.m.) [phone] (after hours)

## 2019-03-16 DIAGNOSIS — Z9181 History of falling: Secondary | ICD-10-CM | POA: Diagnosis not present

## 2019-03-16 DIAGNOSIS — R630 Anorexia: Secondary | ICD-10-CM | POA: Diagnosis not present

## 2019-03-16 DIAGNOSIS — M6281 Muscle weakness (generalized): Secondary | ICD-10-CM | POA: Diagnosis not present

## 2019-03-16 DIAGNOSIS — L932 Other local lupus erythematosus: Secondary | ICD-10-CM | POA: Diagnosis not present

## 2019-03-17 DIAGNOSIS — M6281 Muscle weakness (generalized): Secondary | ICD-10-CM | POA: Diagnosis not present

## 2019-03-17 DIAGNOSIS — R630 Anorexia: Secondary | ICD-10-CM | POA: Diagnosis not present

## 2019-03-17 DIAGNOSIS — L932 Other local lupus erythematosus: Secondary | ICD-10-CM | POA: Diagnosis not present

## 2019-03-17 DIAGNOSIS — Z9181 History of falling: Secondary | ICD-10-CM | POA: Diagnosis not present

## 2019-03-18 DIAGNOSIS — L932 Other local lupus erythematosus: Secondary | ICD-10-CM | POA: Diagnosis not present

## 2019-03-18 DIAGNOSIS — Z9181 History of falling: Secondary | ICD-10-CM | POA: Diagnosis not present

## 2019-03-18 DIAGNOSIS — R630 Anorexia: Secondary | ICD-10-CM | POA: Diagnosis not present

## 2019-03-18 DIAGNOSIS — M6281 Muscle weakness (generalized): Secondary | ICD-10-CM | POA: Diagnosis not present

## 2019-03-20 DIAGNOSIS — Z9181 History of falling: Secondary | ICD-10-CM | POA: Diagnosis not present

## 2019-03-20 DIAGNOSIS — M6281 Muscle weakness (generalized): Secondary | ICD-10-CM | POA: Diagnosis not present

## 2019-03-20 DIAGNOSIS — L932 Other local lupus erythematosus: Secondary | ICD-10-CM | POA: Diagnosis not present

## 2019-03-20 DIAGNOSIS — R630 Anorexia: Secondary | ICD-10-CM | POA: Diagnosis not present

## 2019-03-23 DIAGNOSIS — Z9181 History of falling: Secondary | ICD-10-CM | POA: Diagnosis not present

## 2019-03-23 DIAGNOSIS — R630 Anorexia: Secondary | ICD-10-CM | POA: Diagnosis not present

## 2019-03-23 DIAGNOSIS — M6281 Muscle weakness (generalized): Secondary | ICD-10-CM | POA: Diagnosis not present

## 2019-03-23 DIAGNOSIS — L932 Other local lupus erythematosus: Secondary | ICD-10-CM | POA: Diagnosis not present

## 2019-03-24 DIAGNOSIS — M6281 Muscle weakness (generalized): Secondary | ICD-10-CM | POA: Diagnosis not present

## 2019-03-24 DIAGNOSIS — R630 Anorexia: Secondary | ICD-10-CM | POA: Diagnosis not present

## 2019-03-24 DIAGNOSIS — L932 Other local lupus erythematosus: Secondary | ICD-10-CM | POA: Diagnosis not present

## 2019-03-24 DIAGNOSIS — Z9181 History of falling: Secondary | ICD-10-CM | POA: Diagnosis not present

## 2019-03-25 DIAGNOSIS — R630 Anorexia: Secondary | ICD-10-CM | POA: Diagnosis not present

## 2019-03-25 DIAGNOSIS — M6281 Muscle weakness (generalized): Secondary | ICD-10-CM | POA: Diagnosis not present

## 2019-03-25 DIAGNOSIS — L932 Other local lupus erythematosus: Secondary | ICD-10-CM | POA: Diagnosis not present

## 2019-03-25 DIAGNOSIS — Z9181 History of falling: Secondary | ICD-10-CM | POA: Diagnosis not present

## 2019-03-30 ENCOUNTER — Encounter
Admission: RE | Admit: 2019-03-30 | Discharge: 2019-03-30 | Disposition: A | Discharge status: Home / Self Care | Payer: Medicare Other | Source: Ambulatory Visit | Attending: Internal Medicine | Admitting: Internal Medicine

## 2019-03-30 ENCOUNTER — Other Ambulatory Visit
Admission: RE | Admit: 2019-03-30 | Discharge: 2019-03-30 | Disposition: A | Discharge status: Home / Self Care | Payer: Medicare Other | Source: Skilled Nursing Facility | Attending: Internal Medicine | Admitting: Internal Medicine

## 2019-03-30 DIAGNOSIS — Z9181 History of falling: Secondary | ICD-10-CM | POA: Diagnosis not present

## 2019-03-30 DIAGNOSIS — E538 Deficiency of other specified B group vitamins: Secondary | ICD-10-CM | POA: Diagnosis not present

## 2019-03-30 DIAGNOSIS — M6281 Muscle weakness (generalized): Secondary | ICD-10-CM | POA: Diagnosis not present

## 2019-03-30 DIAGNOSIS — R630 Anorexia: Secondary | ICD-10-CM | POA: Diagnosis not present

## 2019-03-30 DIAGNOSIS — L932 Other local lupus erythematosus: Secondary | ICD-10-CM | POA: Diagnosis not present

## 2019-03-30 LAB — VITAMIN B12: Vitamin B-12: 1148 pg/mL — ABNORMAL HIGH (ref 180–914)

## 2019-03-31 DIAGNOSIS — R630 Anorexia: Secondary | ICD-10-CM | POA: Diagnosis not present

## 2019-03-31 DIAGNOSIS — Z9181 History of falling: Secondary | ICD-10-CM | POA: Diagnosis not present

## 2019-03-31 DIAGNOSIS — M6281 Muscle weakness (generalized): Secondary | ICD-10-CM | POA: Diagnosis not present

## 2019-03-31 DIAGNOSIS — L932 Other local lupus erythematosus: Secondary | ICD-10-CM | POA: Diagnosis not present

## 2019-04-01 DIAGNOSIS — R630 Anorexia: Secondary | ICD-10-CM | POA: Diagnosis not present

## 2019-04-01 DIAGNOSIS — Z9181 History of falling: Secondary | ICD-10-CM | POA: Diagnosis not present

## 2019-04-01 DIAGNOSIS — M6281 Muscle weakness (generalized): Secondary | ICD-10-CM | POA: Diagnosis not present

## 2019-04-01 DIAGNOSIS — L932 Other local lupus erythematosus: Secondary | ICD-10-CM | POA: Diagnosis not present

## 2019-04-06 DIAGNOSIS — Z9181 History of falling: Secondary | ICD-10-CM | POA: Diagnosis not present

## 2019-04-06 DIAGNOSIS — Z593 Problems related to living in residential institution: Secondary | ICD-10-CM | POA: Diagnosis not present

## 2019-04-06 DIAGNOSIS — E441 Mild protein-calorie malnutrition: Secondary | ICD-10-CM | POA: Diagnosis not present

## 2019-04-06 DIAGNOSIS — L932 Other local lupus erythematosus: Secondary | ICD-10-CM | POA: Diagnosis not present

## 2019-04-06 DIAGNOSIS — F0390 Unspecified dementia without behavioral disturbance: Secondary | ICD-10-CM | POA: Diagnosis not present

## 2019-04-06 DIAGNOSIS — I255 Ischemic cardiomyopathy: Secondary | ICD-10-CM | POA: Diagnosis not present

## 2019-04-06 DIAGNOSIS — R630 Anorexia: Secondary | ICD-10-CM | POA: Diagnosis not present

## 2019-04-06 DIAGNOSIS — I25119 Atherosclerotic heart disease of native coronary artery with unspecified angina pectoris: Secondary | ICD-10-CM | POA: Diagnosis not present

## 2019-04-06 DIAGNOSIS — M6281 Muscle weakness (generalized): Secondary | ICD-10-CM | POA: Diagnosis not present

## 2019-04-07 DIAGNOSIS — Z9181 History of falling: Secondary | ICD-10-CM | POA: Diagnosis not present

## 2019-04-07 DIAGNOSIS — R630 Anorexia: Secondary | ICD-10-CM | POA: Diagnosis not present

## 2019-04-07 DIAGNOSIS — Z20828 Contact with and (suspected) exposure to other viral communicable diseases: Secondary | ICD-10-CM | POA: Diagnosis not present

## 2019-04-07 DIAGNOSIS — L932 Other local lupus erythematosus: Secondary | ICD-10-CM | POA: Diagnosis not present

## 2019-04-07 DIAGNOSIS — M6281 Muscle weakness (generalized): Secondary | ICD-10-CM | POA: Diagnosis not present

## 2019-04-08 DIAGNOSIS — L932 Other local lupus erythematosus: Secondary | ICD-10-CM | POA: Diagnosis not present

## 2019-04-08 DIAGNOSIS — Z9181 History of falling: Secondary | ICD-10-CM | POA: Diagnosis not present

## 2019-04-08 DIAGNOSIS — R630 Anorexia: Secondary | ICD-10-CM | POA: Diagnosis not present

## 2019-04-08 DIAGNOSIS — M6281 Muscle weakness (generalized): Secondary | ICD-10-CM | POA: Diagnosis not present

## 2019-04-13 DIAGNOSIS — L932 Other local lupus erythematosus: Secondary | ICD-10-CM | POA: Diagnosis not present

## 2019-04-13 DIAGNOSIS — Z9181 History of falling: Secondary | ICD-10-CM | POA: Diagnosis not present

## 2019-04-13 DIAGNOSIS — R630 Anorexia: Secondary | ICD-10-CM | POA: Diagnosis not present

## 2019-04-13 DIAGNOSIS — M6281 Muscle weakness (generalized): Secondary | ICD-10-CM | POA: Diagnosis not present

## 2019-04-14 DIAGNOSIS — L932 Other local lupus erythematosus: Secondary | ICD-10-CM | POA: Diagnosis not present

## 2019-04-14 DIAGNOSIS — R630 Anorexia: Secondary | ICD-10-CM | POA: Diagnosis not present

## 2019-04-14 DIAGNOSIS — M6281 Muscle weakness (generalized): Secondary | ICD-10-CM | POA: Diagnosis not present

## 2019-04-14 DIAGNOSIS — Z9181 History of falling: Secondary | ICD-10-CM | POA: Diagnosis not present

## 2019-04-15 DIAGNOSIS — Z9181 History of falling: Secondary | ICD-10-CM | POA: Diagnosis not present

## 2019-04-15 DIAGNOSIS — L932 Other local lupus erythematosus: Secondary | ICD-10-CM | POA: Diagnosis not present

## 2019-04-15 DIAGNOSIS — R630 Anorexia: Secondary | ICD-10-CM | POA: Diagnosis not present

## 2019-04-15 DIAGNOSIS — M6281 Muscle weakness (generalized): Secondary | ICD-10-CM | POA: Diagnosis not present

## 2019-04-20 DIAGNOSIS — R630 Anorexia: Secondary | ICD-10-CM | POA: Diagnosis not present

## 2019-04-20 DIAGNOSIS — M6281 Muscle weakness (generalized): Secondary | ICD-10-CM | POA: Diagnosis not present

## 2019-04-20 DIAGNOSIS — L932 Other local lupus erythematosus: Secondary | ICD-10-CM | POA: Diagnosis not present

## 2019-04-20 DIAGNOSIS — Z9181 History of falling: Secondary | ICD-10-CM | POA: Diagnosis not present

## 2019-04-21 DIAGNOSIS — Z9181 History of falling: Secondary | ICD-10-CM | POA: Diagnosis not present

## 2019-04-21 DIAGNOSIS — L932 Other local lupus erythematosus: Secondary | ICD-10-CM | POA: Diagnosis not present

## 2019-04-21 DIAGNOSIS — R630 Anorexia: Secondary | ICD-10-CM | POA: Diagnosis not present

## 2019-04-21 DIAGNOSIS — M6281 Muscle weakness (generalized): Secondary | ICD-10-CM | POA: Diagnosis not present

## 2019-04-22 DIAGNOSIS — Z9181 History of falling: Secondary | ICD-10-CM | POA: Diagnosis not present

## 2019-04-22 DIAGNOSIS — M6281 Muscle weakness (generalized): Secondary | ICD-10-CM | POA: Diagnosis not present

## 2019-04-22 DIAGNOSIS — L932 Other local lupus erythematosus: Secondary | ICD-10-CM | POA: Diagnosis not present

## 2019-04-22 DIAGNOSIS — R630 Anorexia: Secondary | ICD-10-CM | POA: Diagnosis not present

## 2019-04-23 DIAGNOSIS — R630 Anorexia: Secondary | ICD-10-CM | POA: Diagnosis not present

## 2019-04-23 DIAGNOSIS — Z9181 History of falling: Secondary | ICD-10-CM | POA: Diagnosis not present

## 2019-04-23 DIAGNOSIS — L932 Other local lupus erythematosus: Secondary | ICD-10-CM | POA: Diagnosis not present

## 2019-04-23 DIAGNOSIS — M6281 Muscle weakness (generalized): Secondary | ICD-10-CM | POA: Diagnosis not present

## 2019-04-27 DIAGNOSIS — M6281 Muscle weakness (generalized): Secondary | ICD-10-CM | POA: Diagnosis not present

## 2019-04-27 DIAGNOSIS — L932 Other local lupus erythematosus: Secondary | ICD-10-CM | POA: Diagnosis not present

## 2019-04-27 DIAGNOSIS — Z9181 History of falling: Secondary | ICD-10-CM | POA: Diagnosis not present

## 2019-04-27 DIAGNOSIS — R630 Anorexia: Secondary | ICD-10-CM | POA: Diagnosis not present

## 2019-04-28 DIAGNOSIS — R630 Anorexia: Secondary | ICD-10-CM | POA: Diagnosis not present

## 2019-04-28 DIAGNOSIS — L932 Other local lupus erythematosus: Secondary | ICD-10-CM | POA: Diagnosis not present

## 2019-04-28 DIAGNOSIS — M6281 Muscle weakness (generalized): Secondary | ICD-10-CM | POA: Diagnosis not present

## 2019-04-28 DIAGNOSIS — Z9181 History of falling: Secondary | ICD-10-CM | POA: Diagnosis not present

## 2019-04-29 ENCOUNTER — Encounter
Admission: RE | Admit: 2019-04-29 | Discharge: 2019-04-29 | Disposition: A | Discharge status: Home / Self Care | Payer: Medicare Other | Source: Ambulatory Visit | Attending: Internal Medicine | Admitting: Internal Medicine

## 2019-04-29 ENCOUNTER — Inpatient Hospital Stay: Admission: RE | Admit: 2019-04-29 | Payer: Medicare Other | Source: Ambulatory Visit | Admitting: Internal Medicine

## 2019-04-29 DIAGNOSIS — M6281 Muscle weakness (generalized): Secondary | ICD-10-CM | POA: Diagnosis not present

## 2019-04-29 DIAGNOSIS — Z9181 History of falling: Secondary | ICD-10-CM | POA: Diagnosis not present

## 2019-04-29 DIAGNOSIS — L932 Other local lupus erythematosus: Secondary | ICD-10-CM | POA: Diagnosis not present

## 2019-04-29 DIAGNOSIS — R630 Anorexia: Secondary | ICD-10-CM | POA: Diagnosis not present

## 2019-05-01 DIAGNOSIS — R630 Anorexia: Secondary | ICD-10-CM | POA: Diagnosis not present

## 2019-05-01 DIAGNOSIS — L932 Other local lupus erythematosus: Secondary | ICD-10-CM | POA: Diagnosis not present

## 2019-05-01 DIAGNOSIS — Z9181 History of falling: Secondary | ICD-10-CM | POA: Diagnosis not present

## 2019-05-01 DIAGNOSIS — M6281 Muscle weakness (generalized): Secondary | ICD-10-CM | POA: Diagnosis not present

## 2019-05-04 DIAGNOSIS — M6281 Muscle weakness (generalized): Secondary | ICD-10-CM | POA: Diagnosis not present

## 2019-05-04 DIAGNOSIS — R630 Anorexia: Secondary | ICD-10-CM | POA: Diagnosis not present

## 2019-05-04 DIAGNOSIS — L932 Other local lupus erythematosus: Secondary | ICD-10-CM | POA: Diagnosis not present

## 2019-05-04 DIAGNOSIS — Z9181 History of falling: Secondary | ICD-10-CM | POA: Diagnosis not present

## 2019-05-05 DIAGNOSIS — Z9181 History of falling: Secondary | ICD-10-CM | POA: Diagnosis not present

## 2019-05-05 DIAGNOSIS — R630 Anorexia: Secondary | ICD-10-CM | POA: Diagnosis not present

## 2019-05-05 DIAGNOSIS — L932 Other local lupus erythematosus: Secondary | ICD-10-CM | POA: Diagnosis not present

## 2019-05-05 DIAGNOSIS — M6281 Muscle weakness (generalized): Secondary | ICD-10-CM | POA: Diagnosis not present

## 2019-05-06 DIAGNOSIS — R630 Anorexia: Secondary | ICD-10-CM | POA: Diagnosis not present

## 2019-05-06 DIAGNOSIS — Z9181 History of falling: Secondary | ICD-10-CM | POA: Diagnosis not present

## 2019-05-06 DIAGNOSIS — L932 Other local lupus erythematosus: Secondary | ICD-10-CM | POA: Diagnosis not present

## 2019-05-06 DIAGNOSIS — M6281 Muscle weakness (generalized): Secondary | ICD-10-CM | POA: Diagnosis not present

## 2019-05-11 DIAGNOSIS — L932 Other local lupus erythematosus: Secondary | ICD-10-CM | POA: Diagnosis not present

## 2019-05-11 DIAGNOSIS — R630 Anorexia: Secondary | ICD-10-CM | POA: Diagnosis not present

## 2019-05-11 DIAGNOSIS — Z9181 History of falling: Secondary | ICD-10-CM | POA: Diagnosis not present

## 2019-05-11 DIAGNOSIS — M6281 Muscle weakness (generalized): Secondary | ICD-10-CM | POA: Diagnosis not present

## 2019-05-12 DIAGNOSIS — M6281 Muscle weakness (generalized): Secondary | ICD-10-CM | POA: Diagnosis not present

## 2019-05-12 DIAGNOSIS — L932 Other local lupus erythematosus: Secondary | ICD-10-CM | POA: Diagnosis not present

## 2019-05-12 DIAGNOSIS — R630 Anorexia: Secondary | ICD-10-CM | POA: Diagnosis not present

## 2019-05-12 DIAGNOSIS — Z9181 History of falling: Secondary | ICD-10-CM | POA: Diagnosis not present

## 2019-05-13 DIAGNOSIS — R630 Anorexia: Secondary | ICD-10-CM | POA: Diagnosis not present

## 2019-05-13 DIAGNOSIS — Z9181 History of falling: Secondary | ICD-10-CM | POA: Diagnosis not present

## 2019-05-13 DIAGNOSIS — M6281 Muscle weakness (generalized): Secondary | ICD-10-CM | POA: Diagnosis not present

## 2019-05-13 DIAGNOSIS — L932 Other local lupus erythematosus: Secondary | ICD-10-CM | POA: Diagnosis not present

## 2019-05-18 DIAGNOSIS — L932 Other local lupus erythematosus: Secondary | ICD-10-CM | POA: Diagnosis not present

## 2019-05-18 DIAGNOSIS — R630 Anorexia: Secondary | ICD-10-CM | POA: Diagnosis not present

## 2019-05-18 DIAGNOSIS — M6281 Muscle weakness (generalized): Secondary | ICD-10-CM | POA: Diagnosis not present

## 2019-05-18 DIAGNOSIS — Z9181 History of falling: Secondary | ICD-10-CM | POA: Diagnosis not present

## 2019-05-19 DIAGNOSIS — R630 Anorexia: Secondary | ICD-10-CM | POA: Diagnosis not present

## 2019-05-19 DIAGNOSIS — M6281 Muscle weakness (generalized): Secondary | ICD-10-CM | POA: Diagnosis not present

## 2019-05-19 DIAGNOSIS — L932 Other local lupus erythematosus: Secondary | ICD-10-CM | POA: Diagnosis not present

## 2019-05-19 DIAGNOSIS — Z9181 History of falling: Secondary | ICD-10-CM | POA: Diagnosis not present

## 2019-05-27 ENCOUNTER — Other Ambulatory Visit: Payer: Self-pay

## 2019-05-30 ENCOUNTER — Encounter
Admission: RE | Admit: 2019-05-30 | Discharge: 2019-05-30 | Disposition: A | Discharge status: Home / Self Care | Payer: Medicare Other | Source: Ambulatory Visit | Attending: Internal Medicine | Admitting: Internal Medicine

## 2019-06-11 DIAGNOSIS — R630 Anorexia: Secondary | ICD-10-CM | POA: Diagnosis not present

## 2019-06-11 DIAGNOSIS — L932 Other local lupus erythematosus: Secondary | ICD-10-CM | POA: Diagnosis not present

## 2019-06-11 DIAGNOSIS — Z9181 History of falling: Secondary | ICD-10-CM | POA: Diagnosis not present

## 2019-06-15 DIAGNOSIS — Z9181 History of falling: Secondary | ICD-10-CM | POA: Diagnosis not present

## 2019-06-15 DIAGNOSIS — R630 Anorexia: Secondary | ICD-10-CM | POA: Diagnosis not present

## 2019-06-15 DIAGNOSIS — L932 Other local lupus erythematosus: Secondary | ICD-10-CM | POA: Diagnosis not present

## 2019-06-17 DIAGNOSIS — L932 Other local lupus erythematosus: Secondary | ICD-10-CM | POA: Diagnosis not present

## 2019-06-17 DIAGNOSIS — Z9181 History of falling: Secondary | ICD-10-CM | POA: Diagnosis not present

## 2019-06-17 DIAGNOSIS — R630 Anorexia: Secondary | ICD-10-CM | POA: Diagnosis not present

## 2019-06-19 DIAGNOSIS — Z9181 History of falling: Secondary | ICD-10-CM | POA: Diagnosis not present

## 2019-06-19 DIAGNOSIS — L932 Other local lupus erythematosus: Secondary | ICD-10-CM | POA: Diagnosis not present

## 2019-06-19 DIAGNOSIS — R630 Anorexia: Secondary | ICD-10-CM | POA: Diagnosis not present

## 2019-06-22 DIAGNOSIS — R630 Anorexia: Secondary | ICD-10-CM | POA: Diagnosis not present

## 2019-06-22 DIAGNOSIS — L932 Other local lupus erythematosus: Secondary | ICD-10-CM | POA: Diagnosis not present

## 2019-06-22 DIAGNOSIS — Z9181 History of falling: Secondary | ICD-10-CM | POA: Diagnosis not present

## 2019-06-24 DIAGNOSIS — L932 Other local lupus erythematosus: Secondary | ICD-10-CM | POA: Diagnosis not present

## 2019-06-24 DIAGNOSIS — R630 Anorexia: Secondary | ICD-10-CM | POA: Diagnosis not present

## 2019-06-24 DIAGNOSIS — Z9181 History of falling: Secondary | ICD-10-CM | POA: Diagnosis not present

## 2019-06-26 DIAGNOSIS — R630 Anorexia: Secondary | ICD-10-CM | POA: Diagnosis not present

## 2019-06-26 DIAGNOSIS — L932 Other local lupus erythematosus: Secondary | ICD-10-CM | POA: Diagnosis not present

## 2019-06-26 DIAGNOSIS — Z9181 History of falling: Secondary | ICD-10-CM | POA: Diagnosis not present

## 2019-06-29 DIAGNOSIS — R630 Anorexia: Secondary | ICD-10-CM | POA: Diagnosis not present

## 2019-06-29 DIAGNOSIS — L932 Other local lupus erythematosus: Secondary | ICD-10-CM | POA: Diagnosis not present

## 2019-06-29 DIAGNOSIS — Z9181 History of falling: Secondary | ICD-10-CM | POA: Diagnosis not present

## 2019-06-30 ENCOUNTER — Encounter
Admission: RE | Admit: 2019-06-30 | Discharge: 2019-06-30 | Disposition: A | Discharge status: Home / Self Care | Payer: Medicare Other | Source: Ambulatory Visit | Attending: Internal Medicine | Admitting: Internal Medicine

## 2019-06-30 DIAGNOSIS — Z9181 History of falling: Secondary | ICD-10-CM | POA: Diagnosis not present

## 2019-06-30 DIAGNOSIS — R630 Anorexia: Secondary | ICD-10-CM | POA: Diagnosis not present

## 2019-06-30 DIAGNOSIS — L932 Other local lupus erythematosus: Secondary | ICD-10-CM | POA: Diagnosis not present

## 2019-07-06 DIAGNOSIS — L932 Other local lupus erythematosus: Secondary | ICD-10-CM | POA: Diagnosis not present

## 2019-07-06 DIAGNOSIS — R630 Anorexia: Secondary | ICD-10-CM | POA: Diagnosis not present

## 2019-07-06 DIAGNOSIS — Z9181 History of falling: Secondary | ICD-10-CM | POA: Diagnosis not present

## 2019-07-07 DIAGNOSIS — L932 Other local lupus erythematosus: Secondary | ICD-10-CM | POA: Diagnosis not present

## 2019-07-07 DIAGNOSIS — R630 Anorexia: Secondary | ICD-10-CM | POA: Diagnosis not present

## 2019-07-07 DIAGNOSIS — Z9181 History of falling: Secondary | ICD-10-CM | POA: Diagnosis not present

## 2019-07-08 DIAGNOSIS — R630 Anorexia: Secondary | ICD-10-CM | POA: Diagnosis not present

## 2019-07-08 DIAGNOSIS — L932 Other local lupus erythematosus: Secondary | ICD-10-CM | POA: Diagnosis not present

## 2019-07-08 DIAGNOSIS — Z9181 History of falling: Secondary | ICD-10-CM | POA: Diagnosis not present

## 2019-07-13 DIAGNOSIS — L932 Other local lupus erythematosus: Secondary | ICD-10-CM | POA: Diagnosis not present

## 2019-07-13 DIAGNOSIS — Z9181 History of falling: Secondary | ICD-10-CM | POA: Diagnosis not present

## 2019-07-13 DIAGNOSIS — R630 Anorexia: Secondary | ICD-10-CM | POA: Diagnosis not present

## 2019-07-14 DIAGNOSIS — R630 Anorexia: Secondary | ICD-10-CM | POA: Diagnosis not present

## 2019-07-14 DIAGNOSIS — Z9181 History of falling: Secondary | ICD-10-CM | POA: Diagnosis not present

## 2019-07-14 DIAGNOSIS — L932 Other local lupus erythematosus: Secondary | ICD-10-CM | POA: Diagnosis not present

## 2019-07-15 DIAGNOSIS — L932 Other local lupus erythematosus: Secondary | ICD-10-CM | POA: Diagnosis not present

## 2019-07-15 DIAGNOSIS — Z9181 History of falling: Secondary | ICD-10-CM | POA: Diagnosis not present

## 2019-07-15 DIAGNOSIS — R630 Anorexia: Secondary | ICD-10-CM | POA: Diagnosis not present

## 2019-07-20 DIAGNOSIS — F0390 Unspecified dementia without behavioral disturbance: Secondary | ICD-10-CM | POA: Diagnosis not present

## 2019-07-20 DIAGNOSIS — I255 Ischemic cardiomyopathy: Secondary | ICD-10-CM | POA: Diagnosis not present

## 2019-07-20 DIAGNOSIS — Z0000 Encounter for general adult medical examination without abnormal findings: Secondary | ICD-10-CM | POA: Diagnosis not present

## 2019-07-20 DIAGNOSIS — E441 Mild protein-calorie malnutrition: Secondary | ICD-10-CM | POA: Diagnosis not present

## 2019-07-20 DIAGNOSIS — I25119 Atherosclerotic heart disease of native coronary artery with unspecified angina pectoris: Secondary | ICD-10-CM | POA: Diagnosis not present

## 2019-07-20 DIAGNOSIS — Z593 Problems related to living in residential institution: Secondary | ICD-10-CM | POA: Diagnosis not present

## 2019-07-21 DIAGNOSIS — R630 Anorexia: Secondary | ICD-10-CM | POA: Diagnosis not present

## 2019-07-21 DIAGNOSIS — Z9181 History of falling: Secondary | ICD-10-CM | POA: Diagnosis not present

## 2019-07-21 DIAGNOSIS — L932 Other local lupus erythematosus: Secondary | ICD-10-CM | POA: Diagnosis not present

## 2019-07-22 DIAGNOSIS — R630 Anorexia: Secondary | ICD-10-CM | POA: Diagnosis not present

## 2019-07-22 DIAGNOSIS — Z9181 History of falling: Secondary | ICD-10-CM | POA: Diagnosis not present

## 2019-07-22 DIAGNOSIS — L932 Other local lupus erythematosus: Secondary | ICD-10-CM | POA: Diagnosis not present

## 2019-07-28 DIAGNOSIS — R630 Anorexia: Secondary | ICD-10-CM | POA: Diagnosis not present

## 2019-07-28 DIAGNOSIS — Z9181 History of falling: Secondary | ICD-10-CM | POA: Diagnosis not present

## 2019-07-28 DIAGNOSIS — L932 Other local lupus erythematosus: Secondary | ICD-10-CM | POA: Diagnosis not present

## 2019-07-29 DIAGNOSIS — R630 Anorexia: Secondary | ICD-10-CM | POA: Diagnosis not present

## 2019-07-29 DIAGNOSIS — L932 Other local lupus erythematosus: Secondary | ICD-10-CM | POA: Diagnosis not present

## 2019-07-29 DIAGNOSIS — Z9181 History of falling: Secondary | ICD-10-CM | POA: Diagnosis not present

## 2019-07-30 DIAGNOSIS — L932 Other local lupus erythematosus: Secondary | ICD-10-CM | POA: Diagnosis not present

## 2019-07-30 DIAGNOSIS — Z9181 History of falling: Secondary | ICD-10-CM | POA: Diagnosis not present

## 2019-07-30 DIAGNOSIS — R630 Anorexia: Secondary | ICD-10-CM | POA: Diagnosis not present

## 2019-07-31 ENCOUNTER — Encounter
Admission: RE | Admit: 2019-07-31 | Discharge: 2019-07-31 | Disposition: A | Discharge status: Home / Self Care | Payer: Medicare Other | Source: Ambulatory Visit | Attending: Internal Medicine | Admitting: Internal Medicine

## 2019-08-03 DIAGNOSIS — Z9181 History of falling: Secondary | ICD-10-CM | POA: Diagnosis not present

## 2019-08-03 DIAGNOSIS — R630 Anorexia: Secondary | ICD-10-CM | POA: Diagnosis not present

## 2019-08-03 DIAGNOSIS — L932 Other local lupus erythematosus: Secondary | ICD-10-CM | POA: Diagnosis not present

## 2019-08-04 DIAGNOSIS — R630 Anorexia: Secondary | ICD-10-CM | POA: Diagnosis not present

## 2019-08-04 DIAGNOSIS — Z9181 History of falling: Secondary | ICD-10-CM | POA: Diagnosis not present

## 2019-08-04 DIAGNOSIS — L932 Other local lupus erythematosus: Secondary | ICD-10-CM | POA: Diagnosis not present

## 2019-08-05 DIAGNOSIS — R630 Anorexia: Secondary | ICD-10-CM | POA: Diagnosis not present

## 2019-08-05 DIAGNOSIS — L932 Other local lupus erythematosus: Secondary | ICD-10-CM | POA: Diagnosis not present

## 2019-08-05 DIAGNOSIS — Z9181 History of falling: Secondary | ICD-10-CM | POA: Diagnosis not present

## 2019-08-10 DIAGNOSIS — R630 Anorexia: Secondary | ICD-10-CM | POA: Diagnosis not present

## 2019-08-10 DIAGNOSIS — L932 Other local lupus erythematosus: Secondary | ICD-10-CM | POA: Diagnosis not present

## 2019-08-10 DIAGNOSIS — Z9181 History of falling: Secondary | ICD-10-CM | POA: Diagnosis not present

## 2019-08-11 DIAGNOSIS — L932 Other local lupus erythematosus: Secondary | ICD-10-CM | POA: Diagnosis not present

## 2019-08-11 DIAGNOSIS — R630 Anorexia: Secondary | ICD-10-CM | POA: Diagnosis not present

## 2019-08-11 DIAGNOSIS — Z9181 History of falling: Secondary | ICD-10-CM | POA: Diagnosis not present

## 2019-08-12 DIAGNOSIS — Z9181 History of falling: Secondary | ICD-10-CM | POA: Diagnosis not present

## 2019-08-12 DIAGNOSIS — L932 Other local lupus erythematosus: Secondary | ICD-10-CM | POA: Diagnosis not present

## 2019-08-12 DIAGNOSIS — R630 Anorexia: Secondary | ICD-10-CM | POA: Diagnosis not present

## 2019-08-17 DIAGNOSIS — Z9181 History of falling: Secondary | ICD-10-CM | POA: Diagnosis not present

## 2019-08-17 DIAGNOSIS — L932 Other local lupus erythematosus: Secondary | ICD-10-CM | POA: Diagnosis not present

## 2019-08-17 DIAGNOSIS — R630 Anorexia: Secondary | ICD-10-CM | POA: Diagnosis not present

## 2019-08-18 DIAGNOSIS — Z9181 History of falling: Secondary | ICD-10-CM | POA: Diagnosis not present

## 2019-08-18 DIAGNOSIS — L932 Other local lupus erythematosus: Secondary | ICD-10-CM | POA: Diagnosis not present

## 2019-08-18 DIAGNOSIS — R630 Anorexia: Secondary | ICD-10-CM | POA: Diagnosis not present

## 2019-08-19 DIAGNOSIS — Z9181 History of falling: Secondary | ICD-10-CM | POA: Diagnosis not present

## 2019-08-19 DIAGNOSIS — R630 Anorexia: Secondary | ICD-10-CM | POA: Diagnosis not present

## 2019-08-19 DIAGNOSIS — L932 Other local lupus erythematosus: Secondary | ICD-10-CM | POA: Diagnosis not present

## 2019-08-24 DIAGNOSIS — R630 Anorexia: Secondary | ICD-10-CM | POA: Diagnosis not present

## 2019-08-24 DIAGNOSIS — Z9181 History of falling: Secondary | ICD-10-CM | POA: Diagnosis not present

## 2019-08-24 DIAGNOSIS — L932 Other local lupus erythematosus: Secondary | ICD-10-CM | POA: Diagnosis not present

## 2019-08-25 DIAGNOSIS — L932 Other local lupus erythematosus: Secondary | ICD-10-CM | POA: Diagnosis not present

## 2019-08-25 DIAGNOSIS — Z9181 History of falling: Secondary | ICD-10-CM | POA: Diagnosis not present

## 2019-08-25 DIAGNOSIS — R630 Anorexia: Secondary | ICD-10-CM | POA: Diagnosis not present

## 2019-08-26 DIAGNOSIS — L932 Other local lupus erythematosus: Secondary | ICD-10-CM | POA: Diagnosis not present

## 2019-08-26 DIAGNOSIS — Z9181 History of falling: Secondary | ICD-10-CM | POA: Diagnosis not present

## 2019-08-26 DIAGNOSIS — R630 Anorexia: Secondary | ICD-10-CM | POA: Diagnosis not present

## 2019-09-07 ENCOUNTER — Encounter
Admission: RE | Admit: 2019-09-07 | Discharge: 2019-09-07 | Disposition: A | Discharge status: Home / Self Care | Payer: Medicare Other | Source: Ambulatory Visit | Attending: Internal Medicine | Admitting: Internal Medicine

## 2019-09-30 DIAGNOSIS — L932 Other local lupus erythematosus: Secondary | ICD-10-CM | POA: Diagnosis not present

## 2019-09-30 DIAGNOSIS — Z9181 History of falling: Secondary | ICD-10-CM | POA: Diagnosis not present

## 2019-09-30 DIAGNOSIS — R630 Anorexia: Secondary | ICD-10-CM | POA: Diagnosis not present

## 2019-10-01 DIAGNOSIS — L932 Other local lupus erythematosus: Secondary | ICD-10-CM | POA: Diagnosis not present

## 2019-10-01 DIAGNOSIS — Z9181 History of falling: Secondary | ICD-10-CM | POA: Diagnosis not present

## 2019-10-01 DIAGNOSIS — R630 Anorexia: Secondary | ICD-10-CM | POA: Diagnosis not present

## 2019-10-02 DIAGNOSIS — Z9181 History of falling: Secondary | ICD-10-CM | POA: Diagnosis not present

## 2019-10-02 DIAGNOSIS — R630 Anorexia: Secondary | ICD-10-CM | POA: Diagnosis not present

## 2019-10-02 DIAGNOSIS — L932 Other local lupus erythematosus: Secondary | ICD-10-CM | POA: Diagnosis not present

## 2019-10-05 DIAGNOSIS — R630 Anorexia: Secondary | ICD-10-CM | POA: Diagnosis not present

## 2019-10-05 DIAGNOSIS — Z9181 History of falling: Secondary | ICD-10-CM | POA: Diagnosis not present

## 2019-10-05 DIAGNOSIS — L932 Other local lupus erythematosus: Secondary | ICD-10-CM | POA: Diagnosis not present

## 2019-10-06 DIAGNOSIS — R630 Anorexia: Secondary | ICD-10-CM | POA: Diagnosis not present

## 2019-10-06 DIAGNOSIS — L932 Other local lupus erythematosus: Secondary | ICD-10-CM | POA: Diagnosis not present

## 2019-10-06 DIAGNOSIS — Z9181 History of falling: Secondary | ICD-10-CM | POA: Diagnosis not present

## 2019-10-08 DIAGNOSIS — R630 Anorexia: Secondary | ICD-10-CM | POA: Diagnosis not present

## 2019-10-08 DIAGNOSIS — L932 Other local lupus erythematosus: Secondary | ICD-10-CM | POA: Diagnosis not present

## 2019-10-08 DIAGNOSIS — Z9181 History of falling: Secondary | ICD-10-CM | POA: Diagnosis not present

## 2019-10-08 IMAGING — CR DG HIP (WITH PELVIS) OPERATIVE*R*
3 series · 3 of 3 positions shown · non-contrast
Comparison: RADIOGRAPHS DATED 08/17/2017

CLINICAL DATA: Intertrochanteric fracture of the proximal right
femur.

EXAM:
OPERATIVE RIGHT HIP (WITH PELVIS IF PERFORMED) 2 VIEWS
TECHNIQUE: Fluoroscopic spot image(s) were submitted for interpretation
post-operatively.

[cont. (1 of 3)]
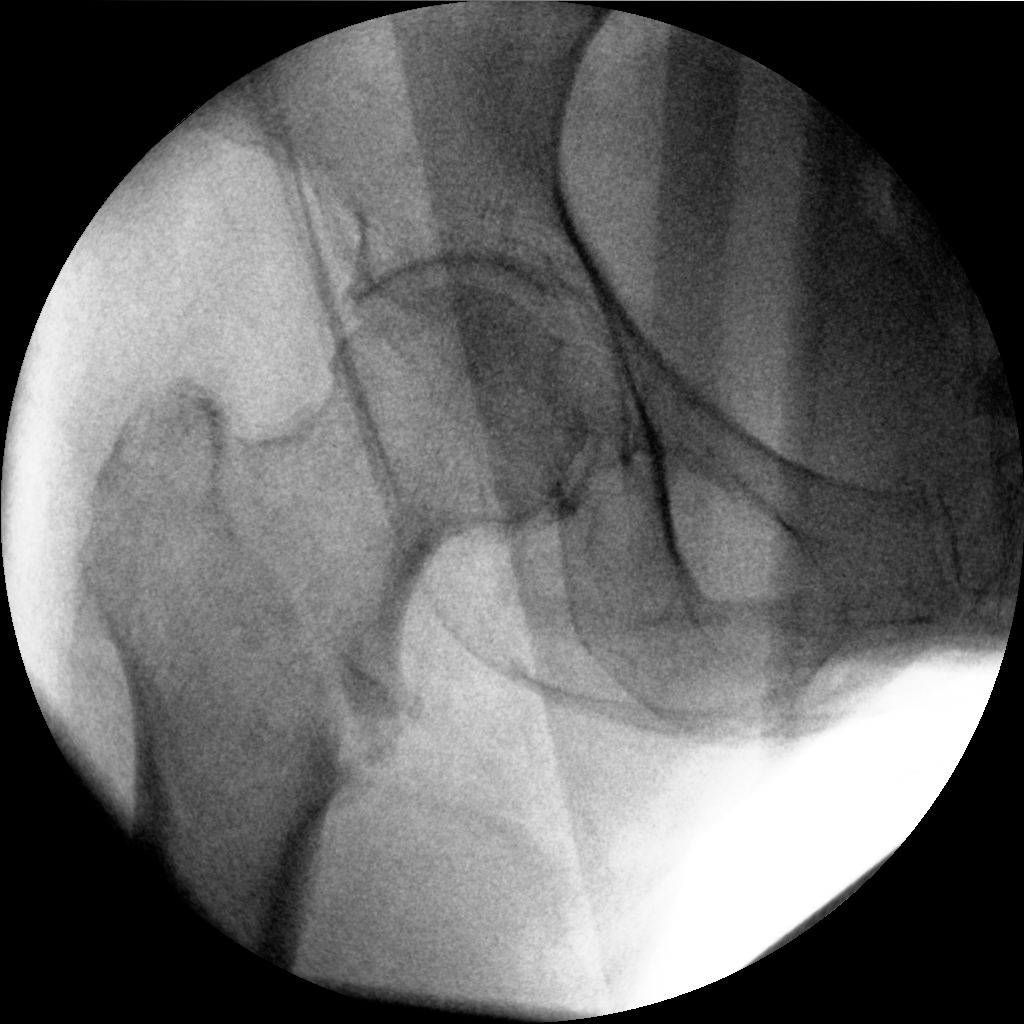

[cont. (2 of 3)]
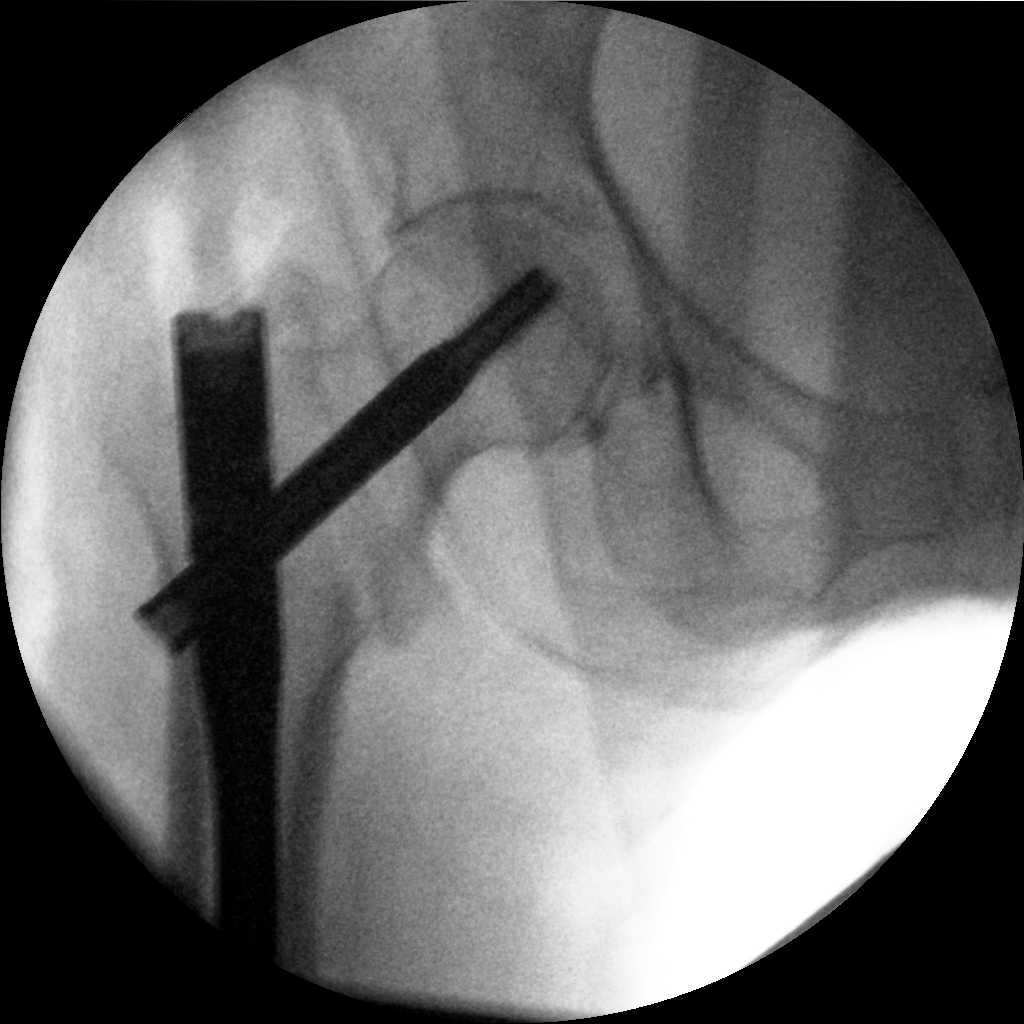

[cont. (3 of 3)]
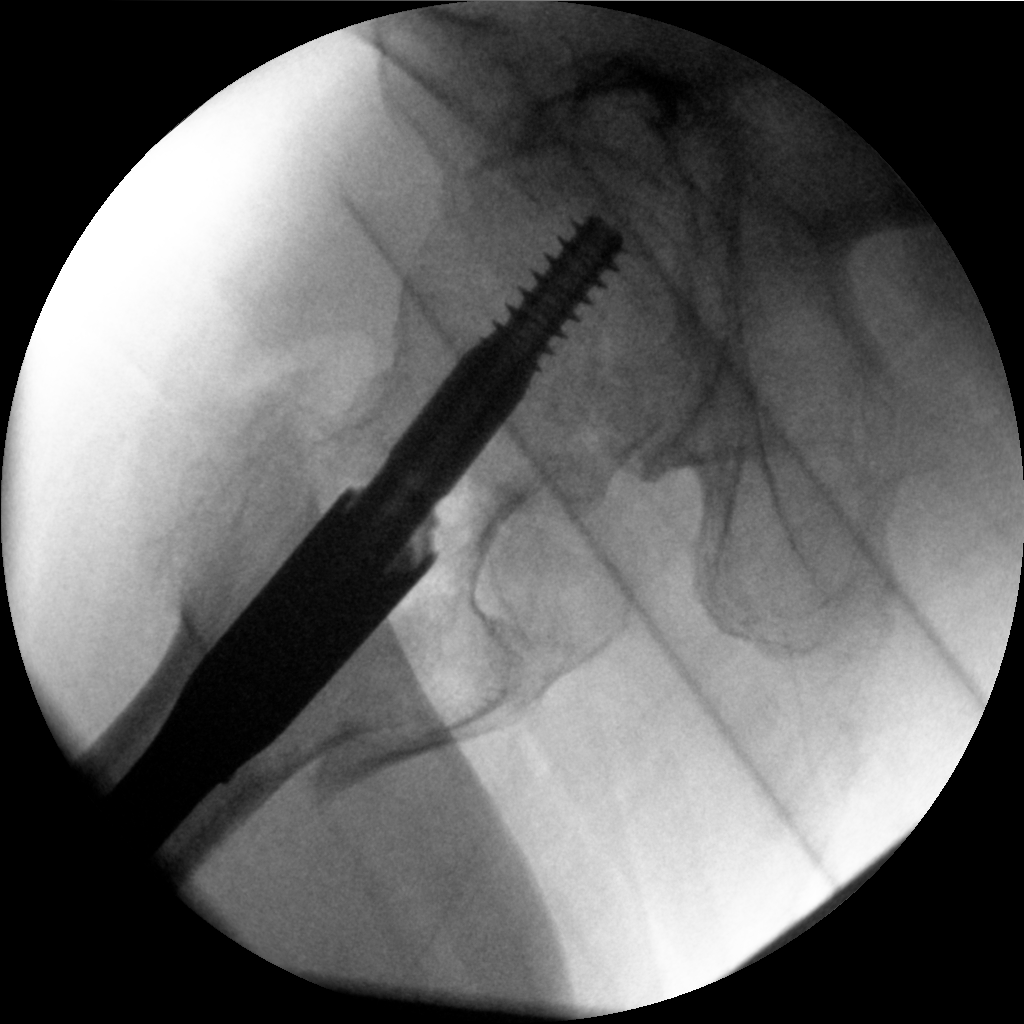

[3 of 3 positions shown; findings below may reference images not displayed]

FINDINGS: AP and lateral C-arm images demonstrate the patient has undergone
open reduction and internal fixation of the intertrochanteric
fracture. Intramedullary nail and lag screw in place, appear in good
position.
IMPRESSION: Open reduction and internal fixation of intertrochanteric fracture
of the proximal right femur.

## 2019-10-12 DIAGNOSIS — L932 Other local lupus erythematosus: Secondary | ICD-10-CM | POA: Diagnosis not present

## 2019-10-12 DIAGNOSIS — R630 Anorexia: Secondary | ICD-10-CM | POA: Diagnosis not present

## 2019-10-12 DIAGNOSIS — Z9181 History of falling: Secondary | ICD-10-CM | POA: Diagnosis not present

## 2019-10-13 DIAGNOSIS — Z9181 History of falling: Secondary | ICD-10-CM | POA: Diagnosis not present

## 2019-10-13 DIAGNOSIS — R630 Anorexia: Secondary | ICD-10-CM | POA: Diagnosis not present

## 2019-10-13 DIAGNOSIS — L932 Other local lupus erythematosus: Secondary | ICD-10-CM | POA: Diagnosis not present

## 2019-10-14 DIAGNOSIS — Z9181 History of falling: Secondary | ICD-10-CM | POA: Diagnosis not present

## 2019-10-14 DIAGNOSIS — Z20828 Contact with and (suspected) exposure to other viral communicable diseases: Secondary | ICD-10-CM | POA: Diagnosis not present

## 2019-10-14 DIAGNOSIS — R630 Anorexia: Secondary | ICD-10-CM | POA: Diagnosis not present

## 2019-10-14 DIAGNOSIS — L932 Other local lupus erythematosus: Secondary | ICD-10-CM | POA: Diagnosis not present

## 2019-10-19 DIAGNOSIS — Z20828 Contact with and (suspected) exposure to other viral communicable diseases: Secondary | ICD-10-CM | POA: Diagnosis not present

## 2019-10-19 DIAGNOSIS — Z9181 History of falling: Secondary | ICD-10-CM | POA: Diagnosis not present

## 2019-10-19 DIAGNOSIS — R630 Anorexia: Secondary | ICD-10-CM | POA: Diagnosis not present

## 2019-10-19 DIAGNOSIS — L932 Other local lupus erythematosus: Secondary | ICD-10-CM | POA: Diagnosis not present

## 2019-10-20 DIAGNOSIS — L932 Other local lupus erythematosus: Secondary | ICD-10-CM | POA: Diagnosis not present

## 2019-10-20 DIAGNOSIS — Z9181 History of falling: Secondary | ICD-10-CM | POA: Diagnosis not present

## 2019-10-20 DIAGNOSIS — R630 Anorexia: Secondary | ICD-10-CM | POA: Diagnosis not present

## 2019-10-21 DIAGNOSIS — R630 Anorexia: Secondary | ICD-10-CM | POA: Diagnosis not present

## 2019-10-21 DIAGNOSIS — L932 Other local lupus erythematosus: Secondary | ICD-10-CM | POA: Diagnosis not present

## 2019-10-21 DIAGNOSIS — Z9181 History of falling: Secondary | ICD-10-CM | POA: Diagnosis not present

## 2019-10-26 DIAGNOSIS — Z20828 Contact with and (suspected) exposure to other viral communicable diseases: Secondary | ICD-10-CM | POA: Diagnosis not present

## 2019-10-26 DIAGNOSIS — R630 Anorexia: Secondary | ICD-10-CM | POA: Diagnosis not present

## 2019-10-26 DIAGNOSIS — L932 Other local lupus erythematosus: Secondary | ICD-10-CM | POA: Diagnosis not present

## 2019-10-26 DIAGNOSIS — Z9181 History of falling: Secondary | ICD-10-CM | POA: Diagnosis not present

## 2019-10-27 DIAGNOSIS — R630 Anorexia: Secondary | ICD-10-CM | POA: Diagnosis not present

## 2019-10-27 DIAGNOSIS — Z9181 History of falling: Secondary | ICD-10-CM | POA: Diagnosis not present

## 2019-10-27 DIAGNOSIS — L932 Other local lupus erythematosus: Secondary | ICD-10-CM | POA: Diagnosis not present

## 2019-10-28 DIAGNOSIS — Z9181 History of falling: Secondary | ICD-10-CM | POA: Diagnosis not present

## 2019-10-28 DIAGNOSIS — L932 Other local lupus erythematosus: Secondary | ICD-10-CM | POA: Diagnosis not present

## 2019-10-28 DIAGNOSIS — R630 Anorexia: Secondary | ICD-10-CM | POA: Diagnosis not present

## 2019-11-02 DIAGNOSIS — R2689 Other abnormalities of gait and mobility: Secondary | ICD-10-CM | POA: Diagnosis not present

## 2019-11-02 DIAGNOSIS — M6281 Muscle weakness (generalized): Secondary | ICD-10-CM | POA: Diagnosis not present

## 2019-11-02 DIAGNOSIS — Z9181 History of falling: Secondary | ICD-10-CM | POA: Diagnosis not present

## 2019-11-02 DIAGNOSIS — R279 Unspecified lack of coordination: Secondary | ICD-10-CM | POA: Diagnosis not present

## 2019-11-03 DIAGNOSIS — R2689 Other abnormalities of gait and mobility: Secondary | ICD-10-CM | POA: Diagnosis not present

## 2019-11-03 DIAGNOSIS — Z9181 History of falling: Secondary | ICD-10-CM | POA: Diagnosis not present

## 2019-11-03 DIAGNOSIS — M6281 Muscle weakness (generalized): Secondary | ICD-10-CM | POA: Diagnosis not present

## 2019-11-03 DIAGNOSIS — R279 Unspecified lack of coordination: Secondary | ICD-10-CM | POA: Diagnosis not present

## 2019-11-04 DIAGNOSIS — R2689 Other abnormalities of gait and mobility: Secondary | ICD-10-CM | POA: Diagnosis not present

## 2019-11-04 DIAGNOSIS — R279 Unspecified lack of coordination: Secondary | ICD-10-CM | POA: Diagnosis not present

## 2019-11-04 DIAGNOSIS — M6281 Muscle weakness (generalized): Secondary | ICD-10-CM | POA: Diagnosis not present

## 2019-11-04 DIAGNOSIS — Z9181 History of falling: Secondary | ICD-10-CM | POA: Diagnosis not present

## 2019-11-06 DIAGNOSIS — Z23 Encounter for immunization: Secondary | ICD-10-CM | POA: Diagnosis not present

## 2019-11-09 DIAGNOSIS — M6281 Muscle weakness (generalized): Secondary | ICD-10-CM | POA: Diagnosis not present

## 2019-11-09 DIAGNOSIS — Z20828 Contact with and (suspected) exposure to other viral communicable diseases: Secondary | ICD-10-CM | POA: Diagnosis not present

## 2019-11-09 DIAGNOSIS — R279 Unspecified lack of coordination: Secondary | ICD-10-CM | POA: Diagnosis not present

## 2019-11-09 DIAGNOSIS — Z9181 History of falling: Secondary | ICD-10-CM | POA: Diagnosis not present

## 2019-11-09 DIAGNOSIS — R2689 Other abnormalities of gait and mobility: Secondary | ICD-10-CM | POA: Diagnosis not present

## 2019-11-10 DIAGNOSIS — R2689 Other abnormalities of gait and mobility: Secondary | ICD-10-CM | POA: Diagnosis not present

## 2019-11-10 DIAGNOSIS — M6281 Muscle weakness (generalized): Secondary | ICD-10-CM | POA: Diagnosis not present

## 2019-11-10 DIAGNOSIS — R279 Unspecified lack of coordination: Secondary | ICD-10-CM | POA: Diagnosis not present

## 2019-11-10 DIAGNOSIS — Z9181 History of falling: Secondary | ICD-10-CM | POA: Diagnosis not present

## 2019-11-11 DIAGNOSIS — M6281 Muscle weakness (generalized): Secondary | ICD-10-CM | POA: Diagnosis not present

## 2019-11-11 DIAGNOSIS — Z9181 History of falling: Secondary | ICD-10-CM | POA: Diagnosis not present

## 2019-11-11 DIAGNOSIS — R2689 Other abnormalities of gait and mobility: Secondary | ICD-10-CM | POA: Diagnosis not present

## 2019-11-11 DIAGNOSIS — R279 Unspecified lack of coordination: Secondary | ICD-10-CM | POA: Diagnosis not present

## 2019-11-12 DIAGNOSIS — F0390 Unspecified dementia without behavioral disturbance: Secondary | ICD-10-CM | POA: Diagnosis not present

## 2019-11-12 DIAGNOSIS — I255 Ischemic cardiomyopathy: Secondary | ICD-10-CM | POA: Diagnosis not present

## 2019-11-12 DIAGNOSIS — E441 Mild protein-calorie malnutrition: Secondary | ICD-10-CM | POA: Diagnosis not present

## 2019-11-12 DIAGNOSIS — Z593 Problems related to living in residential institution: Secondary | ICD-10-CM | POA: Diagnosis not present

## 2019-11-16 DIAGNOSIS — R2689 Other abnormalities of gait and mobility: Secondary | ICD-10-CM | POA: Diagnosis not present

## 2019-11-16 DIAGNOSIS — R279 Unspecified lack of coordination: Secondary | ICD-10-CM | POA: Diagnosis not present

## 2019-11-16 DIAGNOSIS — Z9181 History of falling: Secondary | ICD-10-CM | POA: Diagnosis not present

## 2019-11-16 DIAGNOSIS — Z20828 Contact with and (suspected) exposure to other viral communicable diseases: Secondary | ICD-10-CM | POA: Diagnosis not present

## 2019-11-16 DIAGNOSIS — M6281 Muscle weakness (generalized): Secondary | ICD-10-CM | POA: Diagnosis not present

## 2019-11-17 DIAGNOSIS — M6281 Muscle weakness (generalized): Secondary | ICD-10-CM | POA: Diagnosis not present

## 2019-11-17 DIAGNOSIS — R2689 Other abnormalities of gait and mobility: Secondary | ICD-10-CM | POA: Diagnosis not present

## 2019-11-17 DIAGNOSIS — R279 Unspecified lack of coordination: Secondary | ICD-10-CM | POA: Diagnosis not present

## 2019-11-17 DIAGNOSIS — Z9181 History of falling: Secondary | ICD-10-CM | POA: Diagnosis not present

## 2019-11-18 DIAGNOSIS — M6281 Muscle weakness (generalized): Secondary | ICD-10-CM | POA: Diagnosis not present

## 2019-11-18 DIAGNOSIS — Z9181 History of falling: Secondary | ICD-10-CM | POA: Diagnosis not present

## 2019-11-18 DIAGNOSIS — R279 Unspecified lack of coordination: Secondary | ICD-10-CM | POA: Diagnosis not present

## 2019-11-18 DIAGNOSIS — R2689 Other abnormalities of gait and mobility: Secondary | ICD-10-CM | POA: Diagnosis not present

## 2019-11-23 DIAGNOSIS — R2689 Other abnormalities of gait and mobility: Secondary | ICD-10-CM | POA: Diagnosis not present

## 2019-11-23 DIAGNOSIS — Z9181 History of falling: Secondary | ICD-10-CM | POA: Diagnosis not present

## 2019-11-23 DIAGNOSIS — R279 Unspecified lack of coordination: Secondary | ICD-10-CM | POA: Diagnosis not present

## 2019-11-23 DIAGNOSIS — M6281 Muscle weakness (generalized): Secondary | ICD-10-CM | POA: Diagnosis not present

## 2019-11-25 DIAGNOSIS — Z20828 Contact with and (suspected) exposure to other viral communicable diseases: Secondary | ICD-10-CM | POA: Diagnosis not present

## 2019-11-25 DIAGNOSIS — R2689 Other abnormalities of gait and mobility: Secondary | ICD-10-CM | POA: Diagnosis not present

## 2019-11-25 DIAGNOSIS — M6281 Muscle weakness (generalized): Secondary | ICD-10-CM | POA: Diagnosis not present

## 2019-11-25 DIAGNOSIS — R279 Unspecified lack of coordination: Secondary | ICD-10-CM | POA: Diagnosis not present

## 2019-11-25 DIAGNOSIS — Z9181 History of falling: Secondary | ICD-10-CM | POA: Diagnosis not present

## 2019-11-30 DIAGNOSIS — L932 Other local lupus erythematosus: Secondary | ICD-10-CM | POA: Diagnosis not present

## 2019-11-30 DIAGNOSIS — M6281 Muscle weakness (generalized): Secondary | ICD-10-CM | POA: Diagnosis not present

## 2019-11-30 DIAGNOSIS — R2689 Other abnormalities of gait and mobility: Secondary | ICD-10-CM | POA: Diagnosis not present

## 2019-11-30 DIAGNOSIS — R630 Anorexia: Secondary | ICD-10-CM | POA: Diagnosis not present

## 2019-11-30 DIAGNOSIS — R279 Unspecified lack of coordination: Secondary | ICD-10-CM | POA: Diagnosis not present

## 2019-11-30 DIAGNOSIS — Z9181 History of falling: Secondary | ICD-10-CM | POA: Diagnosis not present

## 2019-12-01 DIAGNOSIS — M6281 Muscle weakness (generalized): Secondary | ICD-10-CM | POA: Diagnosis not present

## 2019-12-01 DIAGNOSIS — R2689 Other abnormalities of gait and mobility: Secondary | ICD-10-CM | POA: Diagnosis not present

## 2019-12-01 DIAGNOSIS — R279 Unspecified lack of coordination: Secondary | ICD-10-CM | POA: Diagnosis not present

## 2019-12-01 DIAGNOSIS — L932 Other local lupus erythematosus: Secondary | ICD-10-CM | POA: Diagnosis not present

## 2019-12-01 DIAGNOSIS — R630 Anorexia: Secondary | ICD-10-CM | POA: Diagnosis not present

## 2019-12-01 DIAGNOSIS — Z9181 History of falling: Secondary | ICD-10-CM | POA: Diagnosis not present

## 2019-12-01 DIAGNOSIS — Z20828 Contact with and (suspected) exposure to other viral communicable diseases: Secondary | ICD-10-CM | POA: Diagnosis not present

## 2019-12-02 DIAGNOSIS — M6281 Muscle weakness (generalized): Secondary | ICD-10-CM | POA: Diagnosis not present

## 2019-12-02 DIAGNOSIS — R2689 Other abnormalities of gait and mobility: Secondary | ICD-10-CM | POA: Diagnosis not present

## 2019-12-02 DIAGNOSIS — L932 Other local lupus erythematosus: Secondary | ICD-10-CM | POA: Diagnosis not present

## 2019-12-02 DIAGNOSIS — Z9181 History of falling: Secondary | ICD-10-CM | POA: Diagnosis not present

## 2019-12-02 DIAGNOSIS — R279 Unspecified lack of coordination: Secondary | ICD-10-CM | POA: Diagnosis not present

## 2019-12-02 DIAGNOSIS — R630 Anorexia: Secondary | ICD-10-CM | POA: Diagnosis not present

## 2019-12-04 DIAGNOSIS — Z23 Encounter for immunization: Secondary | ICD-10-CM | POA: Diagnosis not present

## 2019-12-07 DIAGNOSIS — Z20828 Contact with and (suspected) exposure to other viral communicable diseases: Secondary | ICD-10-CM | POA: Diagnosis not present

## 2019-12-07 DIAGNOSIS — Z9181 History of falling: Secondary | ICD-10-CM | POA: Diagnosis not present

## 2019-12-07 DIAGNOSIS — R279 Unspecified lack of coordination: Secondary | ICD-10-CM | POA: Diagnosis not present

## 2019-12-07 DIAGNOSIS — R2689 Other abnormalities of gait and mobility: Secondary | ICD-10-CM | POA: Diagnosis not present

## 2019-12-07 DIAGNOSIS — R630 Anorexia: Secondary | ICD-10-CM | POA: Diagnosis not present

## 2019-12-07 DIAGNOSIS — L932 Other local lupus erythematosus: Secondary | ICD-10-CM | POA: Diagnosis not present

## 2019-12-07 DIAGNOSIS — M6281 Muscle weakness (generalized): Secondary | ICD-10-CM | POA: Diagnosis not present

## 2019-12-08 DIAGNOSIS — Z9181 History of falling: Secondary | ICD-10-CM | POA: Diagnosis not present

## 2019-12-08 DIAGNOSIS — R279 Unspecified lack of coordination: Secondary | ICD-10-CM | POA: Diagnosis not present

## 2019-12-08 DIAGNOSIS — L932 Other local lupus erythematosus: Secondary | ICD-10-CM | POA: Diagnosis not present

## 2019-12-08 DIAGNOSIS — R2689 Other abnormalities of gait and mobility: Secondary | ICD-10-CM | POA: Diagnosis not present

## 2019-12-08 DIAGNOSIS — M6281 Muscle weakness (generalized): Secondary | ICD-10-CM | POA: Diagnosis not present

## 2019-12-08 DIAGNOSIS — R630 Anorexia: Secondary | ICD-10-CM | POA: Diagnosis not present

## 2019-12-09 DIAGNOSIS — Z9181 History of falling: Secondary | ICD-10-CM | POA: Diagnosis not present

## 2019-12-09 DIAGNOSIS — L932 Other local lupus erythematosus: Secondary | ICD-10-CM | POA: Diagnosis not present

## 2019-12-09 DIAGNOSIS — R2689 Other abnormalities of gait and mobility: Secondary | ICD-10-CM | POA: Diagnosis not present

## 2019-12-09 DIAGNOSIS — R630 Anorexia: Secondary | ICD-10-CM | POA: Diagnosis not present

## 2019-12-09 DIAGNOSIS — M6281 Muscle weakness (generalized): Secondary | ICD-10-CM | POA: Diagnosis not present

## 2019-12-09 DIAGNOSIS — R279 Unspecified lack of coordination: Secondary | ICD-10-CM | POA: Diagnosis not present

## 2019-12-14 DIAGNOSIS — R279 Unspecified lack of coordination: Secondary | ICD-10-CM | POA: Diagnosis not present

## 2019-12-14 DIAGNOSIS — M6281 Muscle weakness (generalized): Secondary | ICD-10-CM | POA: Diagnosis not present

## 2019-12-14 DIAGNOSIS — R2689 Other abnormalities of gait and mobility: Secondary | ICD-10-CM | POA: Diagnosis not present

## 2019-12-14 DIAGNOSIS — Z9181 History of falling: Secondary | ICD-10-CM | POA: Diagnosis not present

## 2019-12-14 DIAGNOSIS — R630 Anorexia: Secondary | ICD-10-CM | POA: Diagnosis not present

## 2019-12-14 DIAGNOSIS — L932 Other local lupus erythematosus: Secondary | ICD-10-CM | POA: Diagnosis not present

## 2019-12-15 DIAGNOSIS — Z9181 History of falling: Secondary | ICD-10-CM | POA: Diagnosis not present

## 2019-12-15 DIAGNOSIS — R2689 Other abnormalities of gait and mobility: Secondary | ICD-10-CM | POA: Diagnosis not present

## 2019-12-15 DIAGNOSIS — L932 Other local lupus erythematosus: Secondary | ICD-10-CM | POA: Diagnosis not present

## 2019-12-15 DIAGNOSIS — R630 Anorexia: Secondary | ICD-10-CM | POA: Diagnosis not present

## 2019-12-15 DIAGNOSIS — M6281 Muscle weakness (generalized): Secondary | ICD-10-CM | POA: Diagnosis not present

## 2019-12-15 DIAGNOSIS — R279 Unspecified lack of coordination: Secondary | ICD-10-CM | POA: Diagnosis not present

## 2019-12-16 DIAGNOSIS — L932 Other local lupus erythematosus: Secondary | ICD-10-CM | POA: Diagnosis not present

## 2019-12-16 DIAGNOSIS — R2689 Other abnormalities of gait and mobility: Secondary | ICD-10-CM | POA: Diagnosis not present

## 2019-12-16 DIAGNOSIS — Z9181 History of falling: Secondary | ICD-10-CM | POA: Diagnosis not present

## 2019-12-16 DIAGNOSIS — M6281 Muscle weakness (generalized): Secondary | ICD-10-CM | POA: Diagnosis not present

## 2019-12-16 DIAGNOSIS — R279 Unspecified lack of coordination: Secondary | ICD-10-CM | POA: Diagnosis not present

## 2019-12-16 DIAGNOSIS — R630 Anorexia: Secondary | ICD-10-CM | POA: Diagnosis not present

## 2019-12-17 ENCOUNTER — Encounter
Admission: RE | Admit: 2019-12-17 | Discharge: 2019-12-17 | Disposition: A | Discharge status: Home / Self Care | Payer: Medicare Other | Source: Ambulatory Visit | Attending: Internal Medicine | Admitting: Internal Medicine

## 2019-12-21 DIAGNOSIS — R279 Unspecified lack of coordination: Secondary | ICD-10-CM | POA: Diagnosis not present

## 2019-12-21 DIAGNOSIS — Z9181 History of falling: Secondary | ICD-10-CM | POA: Diagnosis not present

## 2019-12-21 DIAGNOSIS — R630 Anorexia: Secondary | ICD-10-CM | POA: Diagnosis not present

## 2019-12-21 DIAGNOSIS — L932 Other local lupus erythematosus: Secondary | ICD-10-CM | POA: Diagnosis not present

## 2019-12-21 DIAGNOSIS — R2689 Other abnormalities of gait and mobility: Secondary | ICD-10-CM | POA: Diagnosis not present

## 2019-12-21 DIAGNOSIS — M6281 Muscle weakness (generalized): Secondary | ICD-10-CM | POA: Diagnosis not present

## 2019-12-22 DIAGNOSIS — M6281 Muscle weakness (generalized): Secondary | ICD-10-CM | POA: Diagnosis not present

## 2019-12-22 DIAGNOSIS — R2689 Other abnormalities of gait and mobility: Secondary | ICD-10-CM | POA: Diagnosis not present

## 2019-12-22 DIAGNOSIS — Z9181 History of falling: Secondary | ICD-10-CM | POA: Diagnosis not present

## 2019-12-22 DIAGNOSIS — R630 Anorexia: Secondary | ICD-10-CM | POA: Diagnosis not present

## 2019-12-22 DIAGNOSIS — R279 Unspecified lack of coordination: Secondary | ICD-10-CM | POA: Diagnosis not present

## 2019-12-22 DIAGNOSIS — L932 Other local lupus erythematosus: Secondary | ICD-10-CM | POA: Diagnosis not present

## 2019-12-23 DIAGNOSIS — M6281 Muscle weakness (generalized): Secondary | ICD-10-CM | POA: Diagnosis not present

## 2019-12-23 DIAGNOSIS — R279 Unspecified lack of coordination: Secondary | ICD-10-CM | POA: Diagnosis not present

## 2019-12-23 DIAGNOSIS — R630 Anorexia: Secondary | ICD-10-CM | POA: Diagnosis not present

## 2019-12-23 DIAGNOSIS — Z9181 History of falling: Secondary | ICD-10-CM | POA: Diagnosis not present

## 2019-12-23 DIAGNOSIS — R2689 Other abnormalities of gait and mobility: Secondary | ICD-10-CM | POA: Diagnosis not present

## 2019-12-23 DIAGNOSIS — L932 Other local lupus erythematosus: Secondary | ICD-10-CM | POA: Diagnosis not present

## 2020-01-04 DIAGNOSIS — R279 Unspecified lack of coordination: Secondary | ICD-10-CM | POA: Diagnosis not present

## 2020-01-04 DIAGNOSIS — R630 Anorexia: Secondary | ICD-10-CM | POA: Diagnosis not present

## 2020-01-04 DIAGNOSIS — M6281 Muscle weakness (generalized): Secondary | ICD-10-CM | POA: Diagnosis not present

## 2020-01-04 DIAGNOSIS — Z9181 History of falling: Secondary | ICD-10-CM | POA: Diagnosis not present

## 2020-01-04 DIAGNOSIS — R2689 Other abnormalities of gait and mobility: Secondary | ICD-10-CM | POA: Diagnosis not present

## 2020-01-04 DIAGNOSIS — L932 Other local lupus erythematosus: Secondary | ICD-10-CM | POA: Diagnosis not present

## 2020-01-05 DIAGNOSIS — R279 Unspecified lack of coordination: Secondary | ICD-10-CM | POA: Diagnosis not present

## 2020-01-05 DIAGNOSIS — M6281 Muscle weakness (generalized): Secondary | ICD-10-CM | POA: Diagnosis not present

## 2020-01-05 DIAGNOSIS — R630 Anorexia: Secondary | ICD-10-CM | POA: Diagnosis not present

## 2020-01-05 DIAGNOSIS — L932 Other local lupus erythematosus: Secondary | ICD-10-CM | POA: Diagnosis not present

## 2020-01-05 DIAGNOSIS — R2689 Other abnormalities of gait and mobility: Secondary | ICD-10-CM | POA: Diagnosis not present

## 2020-01-05 DIAGNOSIS — Z9181 History of falling: Secondary | ICD-10-CM | POA: Diagnosis not present

## 2020-01-06 DIAGNOSIS — R2689 Other abnormalities of gait and mobility: Secondary | ICD-10-CM | POA: Diagnosis not present

## 2020-01-06 DIAGNOSIS — R279 Unspecified lack of coordination: Secondary | ICD-10-CM | POA: Diagnosis not present

## 2020-01-06 DIAGNOSIS — Z9181 History of falling: Secondary | ICD-10-CM | POA: Diagnosis not present

## 2020-01-06 DIAGNOSIS — R630 Anorexia: Secondary | ICD-10-CM | POA: Diagnosis not present

## 2020-01-06 DIAGNOSIS — M6281 Muscle weakness (generalized): Secondary | ICD-10-CM | POA: Diagnosis not present

## 2020-01-06 DIAGNOSIS — L932 Other local lupus erythematosus: Secondary | ICD-10-CM | POA: Diagnosis not present

## 2020-01-11 DIAGNOSIS — R279 Unspecified lack of coordination: Secondary | ICD-10-CM | POA: Diagnosis not present

## 2020-01-11 DIAGNOSIS — R2689 Other abnormalities of gait and mobility: Secondary | ICD-10-CM | POA: Diagnosis not present

## 2020-01-11 DIAGNOSIS — R630 Anorexia: Secondary | ICD-10-CM | POA: Diagnosis not present

## 2020-01-11 DIAGNOSIS — Z9181 History of falling: Secondary | ICD-10-CM | POA: Diagnosis not present

## 2020-01-11 DIAGNOSIS — L932 Other local lupus erythematosus: Secondary | ICD-10-CM | POA: Diagnosis not present

## 2020-01-11 DIAGNOSIS — M6281 Muscle weakness (generalized): Secondary | ICD-10-CM | POA: Diagnosis not present

## 2020-01-12 DIAGNOSIS — R279 Unspecified lack of coordination: Secondary | ICD-10-CM | POA: Diagnosis not present

## 2020-01-12 DIAGNOSIS — L932 Other local lupus erythematosus: Secondary | ICD-10-CM | POA: Diagnosis not present

## 2020-01-12 DIAGNOSIS — R630 Anorexia: Secondary | ICD-10-CM | POA: Diagnosis not present

## 2020-01-12 DIAGNOSIS — M6281 Muscle weakness (generalized): Secondary | ICD-10-CM | POA: Diagnosis not present

## 2020-01-12 DIAGNOSIS — R2689 Other abnormalities of gait and mobility: Secondary | ICD-10-CM | POA: Diagnosis not present

## 2020-01-12 DIAGNOSIS — Z9181 History of falling: Secondary | ICD-10-CM | POA: Diagnosis not present

## 2020-01-13 DIAGNOSIS — R2689 Other abnormalities of gait and mobility: Secondary | ICD-10-CM | POA: Diagnosis not present

## 2020-01-13 DIAGNOSIS — R279 Unspecified lack of coordination: Secondary | ICD-10-CM | POA: Diagnosis not present

## 2020-01-13 DIAGNOSIS — R630 Anorexia: Secondary | ICD-10-CM | POA: Diagnosis not present

## 2020-01-13 DIAGNOSIS — Z9181 History of falling: Secondary | ICD-10-CM | POA: Diagnosis not present

## 2020-01-13 DIAGNOSIS — M6281 Muscle weakness (generalized): Secondary | ICD-10-CM | POA: Diagnosis not present

## 2020-01-13 DIAGNOSIS — L932 Other local lupus erythematosus: Secondary | ICD-10-CM | POA: Diagnosis not present

## 2020-01-18 DIAGNOSIS — M6281 Muscle weakness (generalized): Secondary | ICD-10-CM | POA: Diagnosis not present

## 2020-01-18 DIAGNOSIS — R630 Anorexia: Secondary | ICD-10-CM | POA: Diagnosis not present

## 2020-01-18 DIAGNOSIS — L932 Other local lupus erythematosus: Secondary | ICD-10-CM | POA: Diagnosis not present

## 2020-01-18 DIAGNOSIS — R2689 Other abnormalities of gait and mobility: Secondary | ICD-10-CM | POA: Diagnosis not present

## 2020-01-18 DIAGNOSIS — Z9181 History of falling: Secondary | ICD-10-CM | POA: Diagnosis not present

## 2020-01-18 DIAGNOSIS — R279 Unspecified lack of coordination: Secondary | ICD-10-CM | POA: Diagnosis not present

## 2020-01-19 DIAGNOSIS — Z9181 History of falling: Secondary | ICD-10-CM | POA: Diagnosis not present

## 2020-01-19 DIAGNOSIS — R279 Unspecified lack of coordination: Secondary | ICD-10-CM | POA: Diagnosis not present

## 2020-01-19 DIAGNOSIS — R630 Anorexia: Secondary | ICD-10-CM | POA: Diagnosis not present

## 2020-01-19 DIAGNOSIS — L932 Other local lupus erythematosus: Secondary | ICD-10-CM | POA: Diagnosis not present

## 2020-01-19 DIAGNOSIS — R2689 Other abnormalities of gait and mobility: Secondary | ICD-10-CM | POA: Diagnosis not present

## 2020-01-19 DIAGNOSIS — M6281 Muscle weakness (generalized): Secondary | ICD-10-CM | POA: Diagnosis not present

## 2020-01-22 DIAGNOSIS — R630 Anorexia: Secondary | ICD-10-CM | POA: Diagnosis not present

## 2020-01-22 DIAGNOSIS — R279 Unspecified lack of coordination: Secondary | ICD-10-CM | POA: Diagnosis not present

## 2020-01-22 DIAGNOSIS — R2689 Other abnormalities of gait and mobility: Secondary | ICD-10-CM | POA: Diagnosis not present

## 2020-01-22 DIAGNOSIS — Z9181 History of falling: Secondary | ICD-10-CM | POA: Diagnosis not present

## 2020-01-22 DIAGNOSIS — L932 Other local lupus erythematosus: Secondary | ICD-10-CM | POA: Diagnosis not present

## 2020-01-22 DIAGNOSIS — M6281 Muscle weakness (generalized): Secondary | ICD-10-CM | POA: Diagnosis not present

## 2020-01-25 DIAGNOSIS — M6281 Muscle weakness (generalized): Secondary | ICD-10-CM | POA: Diagnosis not present

## 2020-01-25 DIAGNOSIS — R2689 Other abnormalities of gait and mobility: Secondary | ICD-10-CM | POA: Diagnosis not present

## 2020-01-25 DIAGNOSIS — L932 Other local lupus erythematosus: Secondary | ICD-10-CM | POA: Diagnosis not present

## 2020-01-25 DIAGNOSIS — R279 Unspecified lack of coordination: Secondary | ICD-10-CM | POA: Diagnosis not present

## 2020-01-25 DIAGNOSIS — Z9181 History of falling: Secondary | ICD-10-CM | POA: Diagnosis not present

## 2020-01-25 DIAGNOSIS — R630 Anorexia: Secondary | ICD-10-CM | POA: Diagnosis not present

## 2020-01-26 DIAGNOSIS — M6281 Muscle weakness (generalized): Secondary | ICD-10-CM | POA: Diagnosis not present

## 2020-01-26 DIAGNOSIS — R2689 Other abnormalities of gait and mobility: Secondary | ICD-10-CM | POA: Diagnosis not present

## 2020-01-26 DIAGNOSIS — R630 Anorexia: Secondary | ICD-10-CM | POA: Diagnosis not present

## 2020-01-26 DIAGNOSIS — R279 Unspecified lack of coordination: Secondary | ICD-10-CM | POA: Diagnosis not present

## 2020-01-26 DIAGNOSIS — Z9181 History of falling: Secondary | ICD-10-CM | POA: Diagnosis not present

## 2020-01-26 DIAGNOSIS — L932 Other local lupus erythematosus: Secondary | ICD-10-CM | POA: Diagnosis not present

## 2020-01-28 DIAGNOSIS — R2689 Other abnormalities of gait and mobility: Secondary | ICD-10-CM | POA: Diagnosis not present

## 2020-01-28 DIAGNOSIS — M6281 Muscle weakness (generalized): Secondary | ICD-10-CM | POA: Diagnosis not present

## 2020-01-28 DIAGNOSIS — R279 Unspecified lack of coordination: Secondary | ICD-10-CM | POA: Diagnosis not present

## 2020-01-28 DIAGNOSIS — Z9181 History of falling: Secondary | ICD-10-CM | POA: Diagnosis not present

## 2020-02-02 DIAGNOSIS — M6281 Muscle weakness (generalized): Secondary | ICD-10-CM | POA: Diagnosis not present

## 2020-02-02 DIAGNOSIS — Z9181 History of falling: Secondary | ICD-10-CM | POA: Diagnosis not present

## 2020-02-02 DIAGNOSIS — R279 Unspecified lack of coordination: Secondary | ICD-10-CM | POA: Diagnosis not present

## 2020-02-02 DIAGNOSIS — R2689 Other abnormalities of gait and mobility: Secondary | ICD-10-CM | POA: Diagnosis not present

## 2020-02-03 DIAGNOSIS — R2689 Other abnormalities of gait and mobility: Secondary | ICD-10-CM | POA: Diagnosis not present

## 2020-02-03 DIAGNOSIS — M6281 Muscle weakness (generalized): Secondary | ICD-10-CM | POA: Diagnosis not present

## 2020-02-03 DIAGNOSIS — Z9181 History of falling: Secondary | ICD-10-CM | POA: Diagnosis not present

## 2020-02-03 DIAGNOSIS — R279 Unspecified lack of coordination: Secondary | ICD-10-CM | POA: Diagnosis not present

## 2020-02-08 DIAGNOSIS — R2689 Other abnormalities of gait and mobility: Secondary | ICD-10-CM | POA: Diagnosis not present

## 2020-02-08 DIAGNOSIS — M6281 Muscle weakness (generalized): Secondary | ICD-10-CM | POA: Diagnosis not present

## 2020-02-08 DIAGNOSIS — Z9181 History of falling: Secondary | ICD-10-CM | POA: Diagnosis not present

## 2020-02-08 DIAGNOSIS — R279 Unspecified lack of coordination: Secondary | ICD-10-CM | POA: Diagnosis not present

## 2020-02-09 DIAGNOSIS — R279 Unspecified lack of coordination: Secondary | ICD-10-CM | POA: Diagnosis not present

## 2020-02-09 DIAGNOSIS — M6281 Muscle weakness (generalized): Secondary | ICD-10-CM | POA: Diagnosis not present

## 2020-02-09 DIAGNOSIS — R2689 Other abnormalities of gait and mobility: Secondary | ICD-10-CM | POA: Diagnosis not present

## 2020-02-09 DIAGNOSIS — Z9181 History of falling: Secondary | ICD-10-CM | POA: Diagnosis not present

## 2020-02-10 DIAGNOSIS — R279 Unspecified lack of coordination: Secondary | ICD-10-CM | POA: Diagnosis not present

## 2020-02-10 DIAGNOSIS — R2689 Other abnormalities of gait and mobility: Secondary | ICD-10-CM | POA: Diagnosis not present

## 2020-02-10 DIAGNOSIS — Z9181 History of falling: Secondary | ICD-10-CM | POA: Diagnosis not present

## 2020-02-10 DIAGNOSIS — M6281 Muscle weakness (generalized): Secondary | ICD-10-CM | POA: Diagnosis not present

## 2020-02-13 NOTE — Progress Notes (Signed)
Date:  02/15/2020   ID:  Linda Hart, DOB [birth year], MRN [id number]  Patient Location:  [facility] [address]   Provider location:   [facility], [place] office  PCP:  Kirk Ruths, MD  Cardiologist:  Arvid Right [facility]  Chief Complaint  Patient presents with  . office visit    Pt states having sereve SOB/ swelling in legs, ankles and feet. Meds verbally reviewed w/ pt.    History of Present Illness:    Linda Hart is a 86 y.o. female  past medical history of CAD (NSTEMI 03/06/13 s/p DES-LAD),  PAF (in setting of NSTEMI and reperfusion), not on anticoagulation given frequent falls ischemic cardiomyopathy (EF 30-35% on 03/09/13 echo), up to 40 to 45% November 2016 SVT (self-limited on OP monitor),  GERD, remote h/o lightheadedness and syncope who was re-admitted to [facility] with worsening DOE/SOB may 2014.   She lives at the [place] EF 45% She presents today for follow-up of her coronary artery disease, cardiomyopathy  Lives at the [place] on her last meeting, was active with no significant complaints, rare palpitations Denied swelling at that time with no shortness of breath or chest pain   lab work, last in February 2020 Creatinine 1.28 BUN 49 None since then  Continues on carvedilol low-dose spironolactone  Presents with her daughter today More SOB past few weeks Daughter has noticed it as well She has been losing weight, not eating very much, losing muscle mass Interestingly weight relatively stable Minimal lower extremity edema No significant PND orthopnea No regular exercise program, sedentary baseline  She has not been on Lasix, only spironolactone Daughter thought that the Lasix in the past was too strong and wiped her out We have notes indicating she was bothered by frequent trips to the bathroom  EKG personally reviewed by myself on todays visit Normal sinus  rhythm rate 69 bpm no significant ST-T wave changes  Previous records reviewed Recent fall,  hip fx 07/2017 Required surgery Subsequent Anemia UTI, hypotension in the hospital treated with IV antibiotics then Keflex Slow recovery in rehab per the daughter Reports that she is doing much better, ambulating with walker with no assistance Lost 10 pounds during October, trying to get this weight back  Had severe leg swelling  during hospitalization this has slowly recovered Wears compression hose Anemia resolved Aspirin and brilinta were held at discharge and not restarted  Prior history of falls Fall 2 x, kitchen in MAY 2018, And getting gout of the bath tub the same week  She was admitted 03/05/13 with NSTEMI.  cardiac catheterization revealing tubular 90% prox extending to mid LAD stenosis s/p DES, mild atheresclerosis in LCx, RCA.  Post-cath echo showed EF 30-35%, severe anterior, septal and apical HK, mild TR/MR, mildly elevated EF described as reduced. DAPT- ASA/Brilinta x 12 months recommended.  Cr did bump to 1.5-1.6 suspected to be secondary to contrast induced precluding the addition of ACEi/ARB. Limited repeat echo indicated LVEF 30-35%. She declined LifeVest.   After discharge from the hospital, creatinine was >2, Lasix was held. Creatinine in June 2014 improved to 1.5.  Previous event monitor showed frequent supraventricular ectopy, rare PVCs.  She had short runs of SVT, the longest was 11 beats. She had 46 short runs. Uncertain if she was symptomatic during these short runs of SVT   Prior CV studies:   The following studies were reviewed today:  Past Medical History:  Diagnosis Date  . Coronary artery disease 03/06/13   90% prox LAD stenosis s/p DES  . Difficult intubation   . Dizziness   . GERD (gastroesophageal reflux disease)   . Hypotension   . Ischemic cardiomyopathy 03/09/13   s/p NSTEMI. EF 30-35%.  . MI (myocardial infarction) ([place])   . PAF  (paroxysmal atrial fibrillation) ([place]) 03/06/13   In setting of NSTEMI and reperfusion, no recurrence  . SVT (supraventricular tachycardia) (HCC)    Self-limited on outpatient cardiac monitor   Past Surgical History:  Procedure Laterality Date  . CESAREAN SECTION    . CORONARY ANGIOPLASTY WITH STENT PLACEMENT  02/2013   DES-mid LAD, mild LCx, RCA stenoses  . INTRAMEDULLARY (IM) NAIL INTERTROCHANTERIC Right 08/19/2017   Procedure: INTRAMEDULLARY (IM) NAIL INTERTROCHANTRIC;  Surgeon: Hessie Knows, MD;  Location: [facility] ORS;  Service: Orthopedics;  Laterality: Right;     Current Meds  Medication Sig  . aspirin EC 81 MG tablet Take 1 tablet (81 mg total) by mouth daily.  . carvedilol (COREG) 3.125 MG tablet TAKE ONE TABLET TWICE A DAY WITH MEALS.  . nitroGLYCERIN (NITROSTAT) 0.4 MG SL tablet Place 0.4 mg under the tongue every 5 (five) minutes as needed for chest pain.     Allergies:   Penicillins   Social History   Tobacco Use  . Smoking status: Never Smoker  . Smokeless tobacco: Never Used  Substance Use Topics  . Alcohol use: No    Comment: occasional  . Drug use: No     Current Outpatient Medications on File Prior to Visit  Medication Sig Dispense Refill  . aspirin EC 81 MG tablet Take 1 tablet (81 mg total) by mouth daily. 90 tablet 3  . carvedilol (COREG) 3.125 MG tablet TAKE ONE TABLET TWICE A DAY WITH MEALS. 60 tablet 11  . nitroGLYCERIN (NITROSTAT) 0.4 MG SL tablet Place 0.4 mg under the tongue every 5 (five) minutes as needed for chest pain.    . Ascorbic Acid (VITAMIN C) 1000 MG tablet Take 1,000 mg by mouth daily.    . Cholecalciferol 4000 units CAPS Take 1 capsule by mouth every other day.    . docusate sodium (COLACE) 100 MG capsule Take 100 mg by mouth 2 (two) times daily as needed.    . loratadine (CLARITIN) 10 MG tablet Take 1 tablet (10 mg total) by mouth daily as needed for allergies. 30 tablet 11  . NON FORMULARY Diet Type: Regular    . Nutritional Supplements  (ENSURE ENLIVE PO) Take 1 Bottle by mouth. Every other day    . omeprazole (PRILOSEC) 20 MG capsule Take 20 mg by mouth daily.    Marland Kitchen spironolactone (ALDACTONE) 25 MG tablet Take 12.5 mg by mouth daily. 0.5 tablet to = 12.5 mg    . vitamin B-12 (CYANOCOBALAMIN) 1000 MCG tablet Take 1,000 mcg by mouth daily.      No current facility-administered medications on file prior to visit.     Family Hx: The patient's family history includes Heart attack in her father; Heart failure in her father and mother.  ROS:   Please see the history of present illness.    Review of Systems  Constitutional: Negative.   HENT: Negative.   Respiratory: Positive for shortness of breath.   Cardiovascular: Negative.   Gastrointestinal: Negative.   Musculoskeletal: Negative.        Gait instability  Neurological: Negative.   Psychiatric/Behavioral: Negative.   All other systems reviewed and  are negative.    Labs/Other Tests and Data Reviewed:    Recent Labs: No results found for requested labs within last 8760 hours.   Recent Lipid Panel Lab Results  Component Value Date/Time   CHOL 98 09/09/2018 06:00 AM   CHOL 121 03/06/2013 03:43 AM   TRIG 26 09/09/2018 06:00 AM   TRIG 45 03/06/2013 03:43 AM   HDL 55 09/09/2018 06:00 AM   HDL 60 03/06/2013 03:43 AM   CHOLHDL 1.8 09/09/2018 06:00 AM   LDLCALC 38 09/09/2018 06:00 AM   LDLCALC 52 03/06/2013 03:43 AM   LDLDIRECT 60.0 08/23/2016 03:52 PM    Wt Readings from Last 3 Encounters:  02/15/20 90 lb 6 oz (41 kg)  03/12/19 91 lb 3.2 oz (41.4 kg)  01/23/19 93 lb 1.6 oz (42.2 kg)     Exam:    BP (!) 110/48 (BP Location: Left Arm, Patient Position: Sitting, Cuff Size: Normal)   Pulse 69   Ht 5' (1.524 m)   Wt 90 lb 6 oz (41 kg)   SpO2 97%   BMI 17.65 kg/m   Constitutional:  oriented to person, place, and time. No distress.  HENT:  Head: Grossly normal Eyes:  no discharge. No scleral icterus.  Neck: No JVD, no carotid bruits  Cardiovascular:  Regular rate and rhythm, no murmurs appreciated Pulmonary/Chest: Clear to auscultation bilaterally, no wheezes or rails Abdominal: Soft.  no distension.  no tenderness.  Musculoskeletal: Normal range of motion Neurological:  normal muscle tone. Coordination normal. No atrophy Skin: Skin warm and dry Psychiatric: normal affect, pleasant  ASSESSMENT & PLAN:    SVT (supraventricular tachycardia) (HCC) Having occasional palpitations, we did discuss that we could increase her carvedilol if symptoms get worse She does not want to change medications at this time  PAF (paroxysmal atrial fibrillation) (HCC) Not a good candidate for anticoagulation given high fall risk Maintaining normal sinus rhythm on today's visit  Cardiomyopathy, ischemic Has worsening shortness of breath, trace lower extremity edema We will check lab work today BNP, BMP, CBC  atherosclerosis of native coronary artery of native heart with stable angina pectoris ([place]) She is having shortness of breath, Differential does include ischemia May need repeat echocardiogram if symptoms persist  Essential hypertension Blood pressure stable, no medication changes made  Systolic and diastolic CHF, acute on chronic (HCC) We will check a BMP, BNP CBC If renal function stable could give Lasix 10 mg 2 or 3 times a week as needed for shortness of breath and leg swelling Daughter does not think she would know when to ask for the Lasix, we will need to specify with follow-up lab work   Total encounter time more than 25 minutes  Greater than 50% was spent in counseling and coordination of care with the patient   Disposition: Follow-up in 6 months   Signed, Ida Rogue, MD  02/15/2020 2:08 PM    [place] [facility] [id number], [place], [place] [address]

## 2020-02-15 ENCOUNTER — Other Ambulatory Visit: Payer: Self-pay

## 2020-02-15 ENCOUNTER — Encounter: Payer: Self-pay | Admitting: Cardiovascular Disease

## 2020-02-15 ENCOUNTER — Ambulatory Visit (INDEPENDENT_AMBULATORY_CARE_PROVIDER_SITE_OTHER): Payer: Medicare Other | Admitting: Cardiovascular Disease

## 2020-02-15 VITALS — BP 110/48 | HR 69 | Ht 60.0 in | Wt 90.4 lb

## 2020-02-15 DIAGNOSIS — I480 Paroxysmal atrial fibrillation: Secondary | ICD-10-CM

## 2020-02-15 DIAGNOSIS — I25118 Atherosclerotic heart disease of native coronary artery with other forms of angina pectoris: Secondary | ICD-10-CM

## 2020-02-15 DIAGNOSIS — I471 Supraventricular tachycardia: Secondary | ICD-10-CM | POA: Diagnosis not present

## 2020-02-15 DIAGNOSIS — I10 Essential (primary) hypertension: Secondary | ICD-10-CM

## 2020-02-15 DIAGNOSIS — I5043 Acute on chronic combined systolic (congestive) and diastolic (congestive) heart failure: Secondary | ICD-10-CM

## 2020-02-15 DIAGNOSIS — I255 Ischemic cardiomyopathy: Secondary | ICD-10-CM | POA: Diagnosis not present

## 2020-02-15 DIAGNOSIS — R0609 Other forms of dyspnea: Secondary | ICD-10-CM | POA: Diagnosis not present

## 2020-02-15 NOTE — Patient Instructions (Addendum)
Medication Instructions:  No changes  If you need a refill on your cardiac medications before your next appointment, please call your pharmacy.    Lab work: CMP, CBC, and BNP done today   If you have labs (blood work) drawn today and your tests are completely normal, you will receive your results only by: Marland Kitchen MyChart Message (if you have MyChart) OR . A paper copy in the mail If you have any lab test that is abnormal or we need to change your treatment, we will call you to review the results.   Testing/Procedures: No new testing needed   Follow-Up: At [facility], you and your health needs are our priority.  As part of our continuing mission to provide you with exceptional heart care, we have created designated Provider Care Teams.  These Care Teams include your primary Cardiologist (physician) and Advanced Practice Providers (APPs -  Physician Assistants and Nurse Practitioners) who all work together to provide you with the care you need, when you need it.  . You will need a follow up appointment in 6 months   . Providers on your designated Care Team:   . Murray Hodgkins, NP . Christell Faith, PA-C . Marrianne Mood, PA-C  Any Other Special Instructions Will Be Listed Below (If Applicable).  For educational health videos Log in to : [email or web address] Or : [email or web address], password : [email or web address]

## 2020-02-16 DIAGNOSIS — R279 Unspecified lack of coordination: Secondary | ICD-10-CM | POA: Diagnosis not present

## 2020-02-16 DIAGNOSIS — Z9181 History of falling: Secondary | ICD-10-CM | POA: Diagnosis not present

## 2020-02-16 DIAGNOSIS — M6281 Muscle weakness (generalized): Secondary | ICD-10-CM | POA: Diagnosis not present

## 2020-02-16 DIAGNOSIS — R2689 Other abnormalities of gait and mobility: Secondary | ICD-10-CM | POA: Diagnosis not present

## 2020-02-16 LAB — BRAIN NATRIURETIC PEPTIDE: BNP: 227.8 pg/mL — ABNORMAL HIGH (ref 0.0–100.0)

## 2020-02-16 LAB — COMPREHENSIVE METABOLIC PANEL
ALT: 13 IU/L (ref 0–32)
AST: 29 IU/L (ref 0–40)
Albumin/Globulin Ratio: 1.7 (ref 1.2–2.2)
Albumin: 4 g/dL (ref 3.5–4.6)
Alkaline Phosphatase: 116 IU/L (ref 39–117)
BUN/Creatinine Ratio: 21 (ref 12–28)
BUN: 31 mg/dL (ref 10–36)
Bilirubin Total: 0.6 mg/dL (ref 0.0–1.2)
CO2: 25 mmol/L (ref 20–29)
Calcium: 9.1 mg/dL (ref 8.7–10.3)
Chloride: 100 mmol/L (ref 96–106)
Creatinine, Ser: 1.46 mg/dL — ABNORMAL HIGH (ref 0.57–1.00)
GFR calc Af Amer: 36 mL/min/{1.73_m2} — ABNORMAL LOW (ref >59)
GFR calc non Af Amer: 31 mL/min/{1.73_m2} — ABNORMAL LOW (ref >59)
Globulin, Total: 2.4 g/dL (ref 1.5–4.5)
Glucose: 115 mg/dL — ABNORMAL HIGH (ref 65–99)
Potassium: 5 mmol/L (ref 3.5–5.2)
Sodium: 137 mmol/L (ref 134–144)
Total Protein: 6.4 g/dL (ref 6.0–8.5)

## 2020-02-17 DIAGNOSIS — M6281 Muscle weakness (generalized): Secondary | ICD-10-CM | POA: Diagnosis not present

## 2020-02-17 DIAGNOSIS — R279 Unspecified lack of coordination: Secondary | ICD-10-CM | POA: Diagnosis not present

## 2020-02-17 DIAGNOSIS — Z9181 History of falling: Secondary | ICD-10-CM | POA: Diagnosis not present

## 2020-02-17 DIAGNOSIS — R2689 Other abnormalities of gait and mobility: Secondary | ICD-10-CM | POA: Diagnosis not present

## 2020-02-18 ENCOUNTER — Telehealth: Payer: Self-pay | Admitting: *Deleted

## 2020-02-18 NOTE — Telephone Encounter (Signed)
-----   Message from Minna Merritts, MD sent at 02/17/2020  2:50 PM EDT ----- BNP mildly elevated Renal function little bit above the baseline, has been getting worse over the past 3 years  Given the leg swelling shortness of breath symptoms Would recommend we use Lasix cautiously and sparingly Would recommend Lasix 20 mg 2 days a week such as Monday Thursday as needed for leg swelling ,worsening shortness of breath Daughter and patient could ask for it Nurses would have to ask the patient if she wants it  Can we let the daughter know the plan

## 2020-02-18 NOTE — Telephone Encounter (Signed)
Reviewed results with patients daughter per release form. She did say that her walking distance has been increased and no notable shortness of breath. Patient is resident of [facility] facility and she is unsure where to send prescription. Reviewed instructions on medication and she wants to reach out to the facility to see which would be best for her mother to receive this and will call me back. Inquired if she could ask them where to send the prescription as well. She verbalized understanding with no further questions at this time.

## 2020-02-19 DIAGNOSIS — Z9181 History of falling: Secondary | ICD-10-CM | POA: Diagnosis not present

## 2020-02-19 DIAGNOSIS — R279 Unspecified lack of coordination: Secondary | ICD-10-CM | POA: Diagnosis not present

## 2020-02-19 DIAGNOSIS — R2689 Other abnormalities of gait and mobility: Secondary | ICD-10-CM | POA: Diagnosis not present

## 2020-02-19 DIAGNOSIS — M6281 Muscle weakness (generalized): Secondary | ICD-10-CM | POA: Diagnosis not present

## 2020-02-19 NOTE — Telephone Encounter (Signed)
Left voicemail message to call back  

## 2020-02-19 NOTE — Telephone Encounter (Signed)
Spoke with patients daughter per release form and has not heard back from them on where to send in the medication as of yet. She has called and waiting for them to call her back. So she said that she would be in touch with me later today or next week once she finds out which option is best to prevent problems like before. Advised no problem and I would wait to hear from her.

## 2020-02-22 DIAGNOSIS — R2689 Other abnormalities of gait and mobility: Secondary | ICD-10-CM | POA: Diagnosis not present

## 2020-02-22 DIAGNOSIS — Z9181 History of falling: Secondary | ICD-10-CM | POA: Diagnosis not present

## 2020-02-22 DIAGNOSIS — R279 Unspecified lack of coordination: Secondary | ICD-10-CM | POA: Diagnosis not present

## 2020-02-22 DIAGNOSIS — M6281 Muscle weakness (generalized): Secondary | ICD-10-CM | POA: Diagnosis not present

## 2020-02-23 MED ORDER — FUROSEMIDE 20 MG PO TABS: 20.0000 mg | ORAL_TABLET | ORAL | 3 refills | Status: DC | PRN

## 2020-02-23 NOTE — Telephone Encounter (Signed)
Spoke with patients daughter and she reviewed that we need to fax order for furosemide to [facility] at fax number [phone] attention Jocelyn Lamer. Printed prescription for provider to sign and will then fax over to them. She verbalized understanding with no further questions at this time.

## 2020-02-23 NOTE — Telephone Encounter (Signed)
Faxed prescription over to [facility] and confirmation received.

## 2020-02-24 DIAGNOSIS — Z9181 History of falling: Secondary | ICD-10-CM | POA: Diagnosis not present

## 2020-02-24 DIAGNOSIS — R2689 Other abnormalities of gait and mobility: Secondary | ICD-10-CM | POA: Diagnosis not present

## 2020-02-24 DIAGNOSIS — R279 Unspecified lack of coordination: Secondary | ICD-10-CM | POA: Diagnosis not present

## 2020-02-24 DIAGNOSIS — M6281 Muscle weakness (generalized): Secondary | ICD-10-CM | POA: Diagnosis not present

## 2020-02-29 ENCOUNTER — Encounter
Admission: RE | Admit: 2020-02-29 | Discharge: 2020-02-29 | Disposition: A | Discharge status: Home / Self Care | Payer: Medicare Other | Source: Ambulatory Visit | Attending: Internal Medicine | Admitting: Internal Medicine

## 2020-02-29 DIAGNOSIS — R279 Unspecified lack of coordination: Secondary | ICD-10-CM | POA: Diagnosis not present

## 2020-02-29 DIAGNOSIS — R2689 Other abnormalities of gait and mobility: Secondary | ICD-10-CM | POA: Diagnosis not present

## 2020-02-29 DIAGNOSIS — L932 Other local lupus erythematosus: Secondary | ICD-10-CM | POA: Diagnosis not present

## 2020-02-29 DIAGNOSIS — R630 Anorexia: Secondary | ICD-10-CM | POA: Diagnosis not present

## 2020-02-29 DIAGNOSIS — Z9181 History of falling: Secondary | ICD-10-CM | POA: Diagnosis not present

## 2020-02-29 DIAGNOSIS — M6281 Muscle weakness (generalized): Secondary | ICD-10-CM | POA: Diagnosis not present

## 2020-03-02 DIAGNOSIS — M6281 Muscle weakness (generalized): Secondary | ICD-10-CM | POA: Diagnosis not present

## 2020-03-02 DIAGNOSIS — R2689 Other abnormalities of gait and mobility: Secondary | ICD-10-CM | POA: Diagnosis not present

## 2020-03-02 DIAGNOSIS — Z9181 History of falling: Secondary | ICD-10-CM | POA: Diagnosis not present

## 2020-03-02 DIAGNOSIS — R279 Unspecified lack of coordination: Secondary | ICD-10-CM | POA: Diagnosis not present

## 2020-03-02 DIAGNOSIS — L932 Other local lupus erythematosus: Secondary | ICD-10-CM | POA: Diagnosis not present

## 2020-03-02 DIAGNOSIS — R630 Anorexia: Secondary | ICD-10-CM | POA: Diagnosis not present

## 2020-03-07 DIAGNOSIS — R2689 Other abnormalities of gait and mobility: Secondary | ICD-10-CM | POA: Diagnosis not present

## 2020-03-07 DIAGNOSIS — M6281 Muscle weakness (generalized): Secondary | ICD-10-CM | POA: Diagnosis not present

## 2020-03-07 DIAGNOSIS — R279 Unspecified lack of coordination: Secondary | ICD-10-CM | POA: Diagnosis not present

## 2020-03-07 DIAGNOSIS — R630 Anorexia: Secondary | ICD-10-CM | POA: Diagnosis not present

## 2020-03-07 DIAGNOSIS — L932 Other local lupus erythematosus: Secondary | ICD-10-CM | POA: Diagnosis not present

## 2020-03-07 DIAGNOSIS — Z9181 History of falling: Secondary | ICD-10-CM | POA: Diagnosis not present

## 2020-03-09 DIAGNOSIS — L932 Other local lupus erythematosus: Secondary | ICD-10-CM | POA: Diagnosis not present

## 2020-03-09 DIAGNOSIS — Z9181 History of falling: Secondary | ICD-10-CM | POA: Diagnosis not present

## 2020-03-09 DIAGNOSIS — M6281 Muscle weakness (generalized): Secondary | ICD-10-CM | POA: Diagnosis not present

## 2020-03-09 DIAGNOSIS — R630 Anorexia: Secondary | ICD-10-CM | POA: Diagnosis not present

## 2020-03-09 DIAGNOSIS — R279 Unspecified lack of coordination: Secondary | ICD-10-CM | POA: Diagnosis not present

## 2020-03-09 DIAGNOSIS — R2689 Other abnormalities of gait and mobility: Secondary | ICD-10-CM | POA: Diagnosis not present

## 2020-03-14 DIAGNOSIS — R2689 Other abnormalities of gait and mobility: Secondary | ICD-10-CM | POA: Diagnosis not present

## 2020-03-14 DIAGNOSIS — Z9181 History of falling: Secondary | ICD-10-CM | POA: Diagnosis not present

## 2020-03-14 DIAGNOSIS — R630 Anorexia: Secondary | ICD-10-CM | POA: Diagnosis not present

## 2020-03-14 DIAGNOSIS — M6281 Muscle weakness (generalized): Secondary | ICD-10-CM | POA: Diagnosis not present

## 2020-03-14 DIAGNOSIS — L932 Other local lupus erythematosus: Secondary | ICD-10-CM | POA: Diagnosis not present

## 2020-03-14 DIAGNOSIS — R279 Unspecified lack of coordination: Secondary | ICD-10-CM | POA: Diagnosis not present

## 2020-03-17 DIAGNOSIS — R630 Anorexia: Secondary | ICD-10-CM | POA: Diagnosis not present

## 2020-03-17 DIAGNOSIS — R2689 Other abnormalities of gait and mobility: Secondary | ICD-10-CM | POA: Diagnosis not present

## 2020-03-17 DIAGNOSIS — L932 Other local lupus erythematosus: Secondary | ICD-10-CM | POA: Diagnosis not present

## 2020-03-17 DIAGNOSIS — M6281 Muscle weakness (generalized): Secondary | ICD-10-CM | POA: Diagnosis not present

## 2020-03-17 DIAGNOSIS — Z9181 History of falling: Secondary | ICD-10-CM | POA: Diagnosis not present

## 2020-03-17 DIAGNOSIS — R279 Unspecified lack of coordination: Secondary | ICD-10-CM | POA: Diagnosis not present

## 2020-03-18 DIAGNOSIS — E441 Mild protein-calorie malnutrition: Secondary | ICD-10-CM | POA: Diagnosis not present

## 2020-03-18 DIAGNOSIS — F0390 Unspecified dementia without behavioral disturbance: Secondary | ICD-10-CM | POA: Diagnosis not present

## 2020-03-18 DIAGNOSIS — I25119 Atherosclerotic heart disease of native coronary artery with unspecified angina pectoris: Secondary | ICD-10-CM | POA: Diagnosis not present

## 2020-03-18 DIAGNOSIS — Z593 Problems related to living in residential institution: Secondary | ICD-10-CM | POA: Diagnosis not present

## 2020-03-21 DIAGNOSIS — R2689 Other abnormalities of gait and mobility: Secondary | ICD-10-CM | POA: Diagnosis not present

## 2020-03-21 DIAGNOSIS — L932 Other local lupus erythematosus: Secondary | ICD-10-CM | POA: Diagnosis not present

## 2020-03-21 DIAGNOSIS — R630 Anorexia: Secondary | ICD-10-CM | POA: Diagnosis not present

## 2020-03-21 DIAGNOSIS — R279 Unspecified lack of coordination: Secondary | ICD-10-CM | POA: Diagnosis not present

## 2020-03-21 DIAGNOSIS — M6281 Muscle weakness (generalized): Secondary | ICD-10-CM | POA: Diagnosis not present

## 2020-03-21 DIAGNOSIS — Z9181 History of falling: Secondary | ICD-10-CM | POA: Diagnosis not present

## 2020-03-22 DIAGNOSIS — R630 Anorexia: Secondary | ICD-10-CM | POA: Diagnosis not present

## 2020-03-22 DIAGNOSIS — R2689 Other abnormalities of gait and mobility: Secondary | ICD-10-CM | POA: Diagnosis not present

## 2020-03-22 DIAGNOSIS — L932 Other local lupus erythematosus: Secondary | ICD-10-CM | POA: Diagnosis not present

## 2020-03-22 DIAGNOSIS — M6281 Muscle weakness (generalized): Secondary | ICD-10-CM | POA: Diagnosis not present

## 2020-03-22 DIAGNOSIS — Z9181 History of falling: Secondary | ICD-10-CM | POA: Diagnosis not present

## 2020-03-22 DIAGNOSIS — R279 Unspecified lack of coordination: Secondary | ICD-10-CM | POA: Diagnosis not present

## 2020-03-23 DIAGNOSIS — M6281 Muscle weakness (generalized): Secondary | ICD-10-CM | POA: Diagnosis not present

## 2020-03-23 DIAGNOSIS — Z9181 History of falling: Secondary | ICD-10-CM | POA: Diagnosis not present

## 2020-03-23 DIAGNOSIS — L932 Other local lupus erythematosus: Secondary | ICD-10-CM | POA: Diagnosis not present

## 2020-03-23 DIAGNOSIS — R630 Anorexia: Secondary | ICD-10-CM | POA: Diagnosis not present

## 2020-03-23 DIAGNOSIS — R279 Unspecified lack of coordination: Secondary | ICD-10-CM | POA: Diagnosis not present

## 2020-03-23 DIAGNOSIS — R2689 Other abnormalities of gait and mobility: Secondary | ICD-10-CM | POA: Diagnosis not present

## 2020-03-28 DIAGNOSIS — R630 Anorexia: Secondary | ICD-10-CM | POA: Diagnosis not present

## 2020-03-28 DIAGNOSIS — M6281 Muscle weakness (generalized): Secondary | ICD-10-CM | POA: Diagnosis not present

## 2020-03-28 DIAGNOSIS — Z9181 History of falling: Secondary | ICD-10-CM | POA: Diagnosis not present

## 2020-03-28 DIAGNOSIS — R279 Unspecified lack of coordination: Secondary | ICD-10-CM | POA: Diagnosis not present

## 2020-03-28 DIAGNOSIS — L932 Other local lupus erythematosus: Secondary | ICD-10-CM | POA: Diagnosis not present

## 2020-03-28 DIAGNOSIS — R2689 Other abnormalities of gait and mobility: Secondary | ICD-10-CM | POA: Diagnosis not present

## 2020-05-16 ENCOUNTER — Encounter
Admission: RE | Admit: 2020-05-16 | Discharge: 2020-05-16 | Disposition: A | Discharge status: Home / Self Care | Payer: Medicare Other | Source: Ambulatory Visit | Attending: Internal Medicine | Admitting: Internal Medicine

## 2020-06-06 DIAGNOSIS — Z20828 Contact with and (suspected) exposure to other viral communicable diseases: Secondary | ICD-10-CM | POA: Diagnosis not present

## 2020-06-13 DIAGNOSIS — Z20828 Contact with and (suspected) exposure to other viral communicable diseases: Secondary | ICD-10-CM | POA: Diagnosis not present

## 2020-07-20 DIAGNOSIS — Z20828 Contact with and (suspected) exposure to other viral communicable diseases: Secondary | ICD-10-CM | POA: Diagnosis not present

## 2020-07-25 DIAGNOSIS — Z20828 Contact with and (suspected) exposure to other viral communicable diseases: Secondary | ICD-10-CM | POA: Diagnosis not present

## 2020-08-02 DIAGNOSIS — L932 Other local lupus erythematosus: Secondary | ICD-10-CM | POA: Diagnosis not present

## 2020-08-02 DIAGNOSIS — R279 Unspecified lack of coordination: Secondary | ICD-10-CM | POA: Diagnosis not present

## 2020-08-02 DIAGNOSIS — I5042 Chronic combined systolic (congestive) and diastolic (congestive) heart failure: Secondary | ICD-10-CM | POA: Diagnosis not present

## 2020-08-02 DIAGNOSIS — Z9181 History of falling: Secondary | ICD-10-CM | POA: Diagnosis not present

## 2020-08-02 DIAGNOSIS — R630 Anorexia: Secondary | ICD-10-CM | POA: Diagnosis not present

## 2020-08-02 DIAGNOSIS — F0390 Unspecified dementia without behavioral disturbance: Secondary | ICD-10-CM | POA: Diagnosis not present

## 2020-08-02 DIAGNOSIS — R2689 Other abnormalities of gait and mobility: Secondary | ICD-10-CM | POA: Diagnosis not present

## 2020-08-02 DIAGNOSIS — M6281 Muscle weakness (generalized): Secondary | ICD-10-CM | POA: Diagnosis not present

## 2020-08-03 DIAGNOSIS — L932 Other local lupus erythematosus: Secondary | ICD-10-CM | POA: Diagnosis not present

## 2020-08-03 DIAGNOSIS — R279 Unspecified lack of coordination: Secondary | ICD-10-CM | POA: Diagnosis not present

## 2020-08-03 DIAGNOSIS — M6281 Muscle weakness (generalized): Secondary | ICD-10-CM | POA: Diagnosis not present

## 2020-08-03 DIAGNOSIS — I5042 Chronic combined systolic (congestive) and diastolic (congestive) heart failure: Secondary | ICD-10-CM | POA: Diagnosis not present

## 2020-08-03 DIAGNOSIS — R2689 Other abnormalities of gait and mobility: Secondary | ICD-10-CM | POA: Diagnosis not present

## 2020-08-03 DIAGNOSIS — F0390 Unspecified dementia without behavioral disturbance: Secondary | ICD-10-CM | POA: Diagnosis not present

## 2020-08-08 DIAGNOSIS — R279 Unspecified lack of coordination: Secondary | ICD-10-CM | POA: Diagnosis not present

## 2020-08-08 DIAGNOSIS — R2689 Other abnormalities of gait and mobility: Secondary | ICD-10-CM | POA: Diagnosis not present

## 2020-08-08 DIAGNOSIS — F0390 Unspecified dementia without behavioral disturbance: Secondary | ICD-10-CM | POA: Diagnosis not present

## 2020-08-08 DIAGNOSIS — Z23 Encounter for immunization: Secondary | ICD-10-CM | POA: Diagnosis not present

## 2020-08-08 DIAGNOSIS — I5042 Chronic combined systolic (congestive) and diastolic (congestive) heart failure: Secondary | ICD-10-CM | POA: Diagnosis not present

## 2020-08-08 DIAGNOSIS — M6281 Muscle weakness (generalized): Secondary | ICD-10-CM | POA: Diagnosis not present

## 2020-08-08 DIAGNOSIS — L932 Other local lupus erythematosus: Secondary | ICD-10-CM | POA: Diagnosis not present

## 2020-08-10 DIAGNOSIS — F0390 Unspecified dementia without behavioral disturbance: Secondary | ICD-10-CM | POA: Diagnosis not present

## 2020-08-10 DIAGNOSIS — R279 Unspecified lack of coordination: Secondary | ICD-10-CM | POA: Diagnosis not present

## 2020-08-10 DIAGNOSIS — I5042 Chronic combined systolic (congestive) and diastolic (congestive) heart failure: Secondary | ICD-10-CM | POA: Diagnosis not present

## 2020-08-10 DIAGNOSIS — M6281 Muscle weakness (generalized): Secondary | ICD-10-CM | POA: Diagnosis not present

## 2020-08-10 DIAGNOSIS — L932 Other local lupus erythematosus: Secondary | ICD-10-CM | POA: Diagnosis not present

## 2020-08-10 DIAGNOSIS — R2689 Other abnormalities of gait and mobility: Secondary | ICD-10-CM | POA: Diagnosis not present

## 2020-08-15 DIAGNOSIS — L932 Other local lupus erythematosus: Secondary | ICD-10-CM | POA: Diagnosis not present

## 2020-08-15 DIAGNOSIS — I5042 Chronic combined systolic (congestive) and diastolic (congestive) heart failure: Secondary | ICD-10-CM | POA: Diagnosis not present

## 2020-08-15 DIAGNOSIS — R2689 Other abnormalities of gait and mobility: Secondary | ICD-10-CM | POA: Diagnosis not present

## 2020-08-15 DIAGNOSIS — R279 Unspecified lack of coordination: Secondary | ICD-10-CM | POA: Diagnosis not present

## 2020-08-15 DIAGNOSIS — M6281 Muscle weakness (generalized): Secondary | ICD-10-CM | POA: Diagnosis not present

## 2020-08-15 DIAGNOSIS — F0390 Unspecified dementia without behavioral disturbance: Secondary | ICD-10-CM | POA: Diagnosis not present

## 2020-08-17 DIAGNOSIS — R279 Unspecified lack of coordination: Secondary | ICD-10-CM | POA: Diagnosis not present

## 2020-08-17 DIAGNOSIS — F0390 Unspecified dementia without behavioral disturbance: Secondary | ICD-10-CM | POA: Diagnosis not present

## 2020-08-17 DIAGNOSIS — R2689 Other abnormalities of gait and mobility: Secondary | ICD-10-CM | POA: Diagnosis not present

## 2020-08-17 DIAGNOSIS — L932 Other local lupus erythematosus: Secondary | ICD-10-CM | POA: Diagnosis not present

## 2020-08-17 DIAGNOSIS — M6281 Muscle weakness (generalized): Secondary | ICD-10-CM | POA: Diagnosis not present

## 2020-08-17 DIAGNOSIS — I5042 Chronic combined systolic (congestive) and diastolic (congestive) heart failure: Secondary | ICD-10-CM | POA: Diagnosis not present

## 2020-08-24 DIAGNOSIS — F0390 Unspecified dementia without behavioral disturbance: Secondary | ICD-10-CM | POA: Diagnosis not present

## 2020-08-24 DIAGNOSIS — I5042 Chronic combined systolic (congestive) and diastolic (congestive) heart failure: Secondary | ICD-10-CM | POA: Diagnosis not present

## 2020-08-24 DIAGNOSIS — M6281 Muscle weakness (generalized): Secondary | ICD-10-CM | POA: Diagnosis not present

## 2020-08-24 DIAGNOSIS — R279 Unspecified lack of coordination: Secondary | ICD-10-CM | POA: Diagnosis not present

## 2020-08-24 DIAGNOSIS — R2689 Other abnormalities of gait and mobility: Secondary | ICD-10-CM | POA: Diagnosis not present

## 2020-08-24 DIAGNOSIS — L932 Other local lupus erythematosus: Secondary | ICD-10-CM | POA: Diagnosis not present

## 2020-08-29 DIAGNOSIS — R630 Anorexia: Secondary | ICD-10-CM | POA: Diagnosis not present

## 2020-08-29 DIAGNOSIS — I5042 Chronic combined systolic (congestive) and diastolic (congestive) heart failure: Secondary | ICD-10-CM | POA: Diagnosis not present

## 2020-08-29 DIAGNOSIS — M6281 Muscle weakness (generalized): Secondary | ICD-10-CM | POA: Diagnosis not present

## 2020-08-29 DIAGNOSIS — R279 Unspecified lack of coordination: Secondary | ICD-10-CM | POA: Diagnosis not present

## 2020-08-29 DIAGNOSIS — R2689 Other abnormalities of gait and mobility: Secondary | ICD-10-CM | POA: Diagnosis not present

## 2020-08-29 DIAGNOSIS — Z9181 History of falling: Secondary | ICD-10-CM | POA: Diagnosis not present

## 2020-08-29 DIAGNOSIS — L932 Other local lupus erythematosus: Secondary | ICD-10-CM | POA: Diagnosis not present

## 2020-08-29 DIAGNOSIS — F0390 Unspecified dementia without behavioral disturbance: Secondary | ICD-10-CM | POA: Diagnosis not present

## 2020-08-31 DIAGNOSIS — F0390 Unspecified dementia without behavioral disturbance: Secondary | ICD-10-CM | POA: Diagnosis not present

## 2020-08-31 DIAGNOSIS — R279 Unspecified lack of coordination: Secondary | ICD-10-CM | POA: Diagnosis not present

## 2020-08-31 DIAGNOSIS — M6281 Muscle weakness (generalized): Secondary | ICD-10-CM | POA: Diagnosis not present

## 2020-08-31 DIAGNOSIS — L932 Other local lupus erythematosus: Secondary | ICD-10-CM | POA: Diagnosis not present

## 2020-08-31 DIAGNOSIS — I5042 Chronic combined systolic (congestive) and diastolic (congestive) heart failure: Secondary | ICD-10-CM | POA: Diagnosis not present

## 2020-08-31 DIAGNOSIS — R2689 Other abnormalities of gait and mobility: Secondary | ICD-10-CM | POA: Diagnosis not present

## 2020-09-05 DIAGNOSIS — R279 Unspecified lack of coordination: Secondary | ICD-10-CM | POA: Diagnosis not present

## 2020-09-05 DIAGNOSIS — R2689 Other abnormalities of gait and mobility: Secondary | ICD-10-CM | POA: Diagnosis not present

## 2020-09-05 DIAGNOSIS — I5042 Chronic combined systolic (congestive) and diastolic (congestive) heart failure: Secondary | ICD-10-CM | POA: Diagnosis not present

## 2020-09-05 DIAGNOSIS — M6281 Muscle weakness (generalized): Secondary | ICD-10-CM | POA: Diagnosis not present

## 2020-09-05 DIAGNOSIS — L932 Other local lupus erythematosus: Secondary | ICD-10-CM | POA: Diagnosis not present

## 2020-09-05 DIAGNOSIS — F0390 Unspecified dementia without behavioral disturbance: Secondary | ICD-10-CM | POA: Diagnosis not present

## 2020-09-05 NOTE — Progress Notes (Signed)
Date:  09/06/2020   ID:  Linda Hart, DOB 01-Oct-1925, MRN [id number]  Patient Location:  [place] [address]   Provider location:   [facility], [place] office  PCP:  Kirk Ruths, MD  Cardiologist:  Arvid Right [facility]  Chief Complaint  Patient presents with  . office visit    6 month F/U; Meds verbally reviewed with patient.    History of Present Illness:    Linda Hart is a 86 y.o. female  past medical history of CAD (NSTEMI 03/06/13 s/p DES-LAD),  PAF (in setting of NSTEMI and reperfusion), not on anticoagulation given frequent falls ischemic cardiomyopathy (EF 30-35% on 03/09/13 echo), up to 40 to 45% November 2016 SVT (self-limited on OP monitor),  GERD, remote h/o lightheadedness and syncope who was re-admitted to [facility] with worsening DOE/SOB may 2014.   She lives at the [place] EF 45% She presents today for follow-up of her coronary artery disease, cardiomyopathy  Presents with her daughter today Lives at the [place] Does not like food, losing weight Down 10 pounds in a few years  Denies any significant chest pain She does have periodic tachypalpitations, usually they resolve on their own She does not ask for assistance when these episodes happen Does not feel that she is having any shortness of breath or leg swelling Wears compression hose  Last lab work early 2021 Creatinine 1.46 BUN 31 Potassium was trending up, 5 None since then No recent CBC  Continues on carvedilol, low-dose spironolactone Sedentary at baseline  She has not been on Lasix, only spironolactone  previously on Lasix felt exhausted, this was held  EKG personally reviewed by myself on todays visit Normal sinus rhythm rate 74 bpm no significant ST-T wave changes  Previous records reviewed Recent fall,  hip fx 07/2017 Required surgery Subsequent Anemia UTI, hypotension in the hospital treated with  IV antibiotics then Keflex Slow recovery in rehab per the daughter Reports that she is doing much better, ambulating with walker with no assistance Lost 10 pounds during October, trying to get this weight back  Had severe leg swelling  during hospitalization this has slowly recovered Wears compression hose Anemia resolved Aspirin and brilinta were held at discharge and not restarted  Prior history of falls Fall 2 x, kitchen in MAY 2018, And getting gout of the bath tub the same week  She was admitted 03/05/13 with NSTEMI.  cardiac catheterization revealing tubular 90% prox extending to mid LAD stenosis s/p DES, mild atheresclerosis in LCx, RCA.  Post-cath echo showed EF 30-35%, severe anterior, septal and apical HK, mild TR/MR, mildly elevated EF described as reduced. DAPT- ASA/Brilinta x 12 months recommended.  Cr did bump to 1.5-1.6 suspected to be secondary to contrast induced precluding the addition of ACEi/ARB. Limited repeat echo indicated LVEF 30-35%. She declined LifeVest.   After discharge from the hospital, creatinine was >2, Lasix was held. Creatinine in June 2014 improved to 1.5.  Previous event monitor showed frequent supraventricular ectopy, rare PVCs.  She had short runs of SVT, the longest was 11 beats. She had 46 short runs. Uncertain if she was symptomatic during these short runs of SVT   Prior CV studies:   The following studies were reviewed today:    Past Medical History:  Diagnosis Date  . Coronary artery disease 03/06/13   90% prox LAD stenosis s/p DES  . Difficult intubation   . Dizziness   .  GERD (gastroesophageal reflux disease)   . Hypotension   . Ischemic cardiomyopathy 03/09/13   s/p NSTEMI. EF 30-35%.  . MI (myocardial infarction) ([place])   . PAF (paroxysmal atrial fibrillation) ([place]) 03/06/13   In setting of NSTEMI and reperfusion, no recurrence  . SVT (supraventricular tachycardia) (HCC)    Self-limited on outpatient cardiac monitor    Past Surgical History:  Procedure Laterality Date  . CESAREAN SECTION    . CORONARY ANGIOPLASTY WITH STENT PLACEMENT  02/2013   DES-mid LAD, mild LCx, RCA stenoses  . INTRAMEDULLARY (IM) NAIL INTERTROCHANTERIC Right 08/19/2017   Procedure: INTRAMEDULLARY (IM) NAIL INTERTROCHANTRIC;  Surgeon: Hessie Knows, MD;  Location: [facility] ORS;  Service: Orthopedics;  Laterality: Right;     Current Meds  Medication Sig  . Ascorbic Acid (VITAMIN C) 1000 MG tablet Take 1,000 mg by mouth daily.  Marland Kitchen aspirin EC 81 MG tablet Take 1 tablet (81 mg total) by mouth daily.  . carvedilol (COREG) 3.125 MG tablet TAKE ONE TABLET TWICE A DAY WITH MEALS.  Marland Kitchen Cholecalciferol 4000 units CAPS Take 1 capsule by mouth every other day.  . nitroGLYCERIN (NITROSTAT) 0.4 MG SL tablet Place 0.4 mg under the tongue every 5 (five) minutes as needed for chest pain.  . Nutritional Supplements (ENSURE ENLIVE PO) Take 1 Bottle by mouth. Every other day  . omeprazole (PRILOSEC) 20 MG capsule Take 20 mg by mouth daily.  Marland Kitchen spironolactone (ALDACTONE) 25 MG tablet Take 12.5 mg by mouth daily. 0.5 tablet to = 12.5 mg  . vitamin B-12 (CYANOCOBALAMIN) 1000 MCG tablet Take 1,000 mcg by mouth daily.      Allergies:   Penicillins   Social History   Tobacco Use  . Smoking status: Never Smoker  . Smokeless tobacco: Never Used  Vaping Use  . Vaping Use: Never used  Substance Use Topics  . Alcohol use: No    Comment: occasional  . Drug use: No       Family Hx: The patient's family history includes Heart attack in her father; Heart failure in her father and mother.  ROS:   Please see the history of present illness.    Review of Systems  Constitutional: Negative.   HENT: Negative.   Respiratory: Negative.   Cardiovascular: Positive for palpitations.  Gastrointestinal: Negative.   Musculoskeletal: Negative.        Gait instability  Neurological: Negative.   Psychiatric/Behavioral: Negative.   All other systems reviewed and  are negative.    Labs/Other Tests and Data Reviewed:    Recent Labs: 02/15/2020: ALT 13; BNP 227.8; BUN 31; Creatinine, Ser 1.46; Potassium 5.0; Sodium 137   Recent Lipid Panel Lab Results  Component Value Date/Time   CHOL 98 09/09/2018 06:00 AM   CHOL 121 03/06/2013 03:43 AM   TRIG 26 09/09/2018 06:00 AM   TRIG 45 03/06/2013 03:43 AM   HDL 55 09/09/2018 06:00 AM   HDL 60 03/06/2013 03:43 AM   CHOLHDL 1.8 09/09/2018 06:00 AM   LDLCALC 38 09/09/2018 06:00 AM   LDLCALC 52 03/06/2013 03:43 AM   LDLDIRECT 60.0 08/23/2016 03:52 PM    Wt Readings from Last 3 Encounters:  09/06/20 89 lb (40.4 kg)  02/15/20 90 lb 6 oz (41 kg)  03/12/19 91 lb 3.2 oz (41.4 kg)     Exam:    BP 140/66 (BP Location: Right Arm, Patient Position: Sitting, Cuff Size: Normal)   Pulse 74   Ht 5' (1.524 m)   Wt 89 lb (40.4 kg)  SpO2 98%   BMI 17.38 kg/m   Constitutional:  oriented to person, place, and time. No distress.  Presents in a wheelchair HENT:  Head: Grossly normal Eyes:  no discharge. No scleral icterus.  Neck: No JVD, no carotid bruits  Cardiovascular: Regular rate and rhythm, no murmurs appreciated Pulmonary/Chest: Clear to auscultation bilaterally, no wheezes or rails Abdominal: Soft.  no distension.  no tenderness.  Musculoskeletal: Normal range of motion Neurological:  normal muscle tone. Coordination normal. No atrophy Skin: Skin warm and dry Psychiatric: normal affect, pleasant   ASSESSMENT & PLAN:    SVT (supraventricular tachycardia) (HCC) Occasional palpitations, recommend she take extra carvedilol as needed Symptoms are rare, typically self-limiting  PAF (paroxysmal atrial fibrillation) (HCC) Not a good candidate for anticoagulation given high fall risk Normal sinus rhythm today, suggested extra carvedilol for any breakthrough tachypalpitations  Cardiomyopathy, ischemic Denies significant shortness of breath, sedentary baseline On clinical exam appears relatively  euvolemic Lab work today for high potassium April 2021  atherosclerosis of native coronary artery of native heart with stable angina pectoris ([place]) Currently with no symptoms of angina. No further workup at this time. Continue current medication regimen.  Essential hypertension Blood pressure is well controlled on today's visit. No changes made to the medications.  Systolic and diastolic CHF, acute on chronic (HCC) Appears euvolemic, Could add ACE/ARB but given her frail state will hold off on medication changes Limited mobility, no changes to medications, labs today   Total encounter time more than 25 minutes  Greater than 50% was spent in counseling and coordination of care with the patient    Signed, Ida Rogue, MD  09/06/2020 11:02 AM    [place] Office [address], [place], [place] [address]

## 2020-09-06 ENCOUNTER — Ambulatory Visit (INDEPENDENT_AMBULATORY_CARE_PROVIDER_SITE_OTHER): Payer: Medicare Other | Admitting: Cardiovascular Disease

## 2020-09-06 ENCOUNTER — Encounter
Admission: RE | Admit: 2020-09-06 | Discharge: 2020-09-06 | Disposition: A | Discharge status: Home / Self Care | Payer: Medicare Other | Source: Ambulatory Visit | Attending: Internal Medicine | Admitting: Internal Medicine

## 2020-09-06 ENCOUNTER — Encounter: Payer: Self-pay | Admitting: Cardiovascular Disease

## 2020-09-06 ENCOUNTER — Other Ambulatory Visit: Payer: Self-pay

## 2020-09-06 VITALS — BP 140/66 | HR 74 | Ht 60.0 in | Wt 89.0 lb

## 2020-09-06 DIAGNOSIS — I471 Supraventricular tachycardia: Secondary | ICD-10-CM | POA: Diagnosis not present

## 2020-09-06 DIAGNOSIS — I10 Essential (primary) hypertension: Secondary | ICD-10-CM | POA: Diagnosis not present

## 2020-09-06 DIAGNOSIS — I25118 Atherosclerotic heart disease of native coronary artery with other forms of angina pectoris: Secondary | ICD-10-CM

## 2020-09-06 DIAGNOSIS — I255 Ischemic cardiomyopathy: Secondary | ICD-10-CM

## 2020-09-06 DIAGNOSIS — I480 Paroxysmal atrial fibrillation: Secondary | ICD-10-CM | POA: Diagnosis not present

## 2020-09-06 NOTE — Patient Instructions (Addendum)
Labs today: CBC , CMP   Medication Instructions:  For tachycardia or palpitations, Please take extra coreg/carvedilol 3.125 mg x 1 (in addition to regular dosing)  If you need a refill on your cardiac medications before your next appointment, please call your pharmacy.    Lab work: As above   If you have labs (blood work) drawn today and your tests are completely normal, you will receive your results only by: Marland Kitchen MyChart Message (if you have MyChart) OR . A paper copy in the mail If you have any lab test that is abnormal or we need to change your treatment, we will call you to review the results.   Testing/Procedures: No new testing needed   Follow-Up: At [facility], you and your health needs are our priority.  As part of our continuing mission to provide you with exceptional heart care, we have created designated Provider Care Teams.  These Care Teams include your primary Cardiologist (physician) and Advanced Practice Providers (APPs -  Physician Assistants and Nurse Practitioners) who all work together to provide you with the care you need, when you need it.  . You will need a follow up appointment in 6 months  . Providers on your designated Care Team:   . Murray Hodgkins, NP . Christell Faith, PA-C . Marrianne Mood, PA-C  Any Other Special Instructions Will Be Listed Below (If Applicable).  COVID-19 Vaccine Information can be found at: ShippingScam.co.uk For questions related to vaccine distribution or appointments, please email [email or web address] or call [phone].

## 2020-09-07 DIAGNOSIS — F0390 Unspecified dementia without behavioral disturbance: Secondary | ICD-10-CM | POA: Diagnosis not present

## 2020-09-07 DIAGNOSIS — R279 Unspecified lack of coordination: Secondary | ICD-10-CM | POA: Diagnosis not present

## 2020-09-07 DIAGNOSIS — I5042 Chronic combined systolic (congestive) and diastolic (congestive) heart failure: Secondary | ICD-10-CM | POA: Diagnosis not present

## 2020-09-07 DIAGNOSIS — L932 Other local lupus erythematosus: Secondary | ICD-10-CM | POA: Diagnosis not present

## 2020-09-07 DIAGNOSIS — R2689 Other abnormalities of gait and mobility: Secondary | ICD-10-CM | POA: Diagnosis not present

## 2020-09-07 DIAGNOSIS — M6281 Muscle weakness (generalized): Secondary | ICD-10-CM | POA: Diagnosis not present

## 2020-09-07 LAB — CBC
Hematocrit: 35.7 % (ref 34.0–46.6)
Hemoglobin: 12.4 g/dL (ref 11.1–15.9)
MCH: 31.9 pg (ref 26.6–33.0)
MCHC: 34.7 g/dL (ref 31.5–35.7)
MCV: 92 fL (ref 79–97)
Platelets: 275 10*3/uL (ref 150–450)
RBC: 3.89 x10E6/uL (ref 3.77–5.28)
RDW: 11.8 % (ref 11.7–15.4)
WBC: 9.3 10*3/uL (ref 3.4–10.8)

## 2020-09-07 LAB — COMPREHENSIVE METABOLIC PANEL
ALT: 14 IU/L (ref 0–32)
AST: 23 IU/L (ref 0–40)
Albumin/Globulin Ratio: 1 — ABNORMAL LOW (ref 1.2–2.2)
Albumin: 3.2 g/dL — ABNORMAL LOW (ref 3.5–4.6)
Alkaline Phosphatase: 109 IU/L (ref 44–121)
BUN/Creatinine Ratio: 25 (ref 12–28)
BUN: 31 mg/dL (ref 10–36)
Bilirubin Total: 0.7 mg/dL (ref 0.0–1.2)
CO2: 25 mmol/L (ref 20–29)
Calcium: 9.1 mg/dL (ref 8.7–10.3)
Chloride: 99 mmol/L (ref 96–106)
Creatinine, Ser: 1.24 mg/dL — ABNORMAL HIGH (ref 0.57–1.00)
GFR calc Af Amer: 44 mL/min/{1.73_m2} — ABNORMAL LOW (ref >59)
GFR calc non Af Amer: 38 mL/min/{1.73_m2} — ABNORMAL LOW (ref >59)
Globulin, Total: 3.1 g/dL (ref 1.5–4.5)
Glucose: 87 mg/dL (ref 65–99)
Potassium: 5 mmol/L (ref 3.5–5.2)
Sodium: 135 mmol/L (ref 134–144)
Total Protein: 6.3 g/dL (ref 6.0–8.5)

## 2020-09-08 DIAGNOSIS — Z23 Encounter for immunization: Secondary | ICD-10-CM | POA: Diagnosis not present

## 2020-09-12 DIAGNOSIS — R279 Unspecified lack of coordination: Secondary | ICD-10-CM | POA: Diagnosis not present

## 2020-09-12 DIAGNOSIS — M6281 Muscle weakness (generalized): Secondary | ICD-10-CM | POA: Diagnosis not present

## 2020-09-12 DIAGNOSIS — L932 Other local lupus erythematosus: Secondary | ICD-10-CM | POA: Diagnosis not present

## 2020-09-12 DIAGNOSIS — F0390 Unspecified dementia without behavioral disturbance: Secondary | ICD-10-CM | POA: Diagnosis not present

## 2020-09-12 DIAGNOSIS — R2689 Other abnormalities of gait and mobility: Secondary | ICD-10-CM | POA: Diagnosis not present

## 2020-09-12 DIAGNOSIS — I5042 Chronic combined systolic (congestive) and diastolic (congestive) heart failure: Secondary | ICD-10-CM | POA: Diagnosis not present

## 2020-09-14 DIAGNOSIS — M6281 Muscle weakness (generalized): Secondary | ICD-10-CM | POA: Diagnosis not present

## 2020-09-14 DIAGNOSIS — F0390 Unspecified dementia without behavioral disturbance: Secondary | ICD-10-CM | POA: Diagnosis not present

## 2020-09-14 DIAGNOSIS — R279 Unspecified lack of coordination: Secondary | ICD-10-CM | POA: Diagnosis not present

## 2020-09-14 DIAGNOSIS — L932 Other local lupus erythematosus: Secondary | ICD-10-CM | POA: Diagnosis not present

## 2020-09-14 DIAGNOSIS — R2689 Other abnormalities of gait and mobility: Secondary | ICD-10-CM | POA: Diagnosis not present

## 2020-09-14 DIAGNOSIS — I5042 Chronic combined systolic (congestive) and diastolic (congestive) heart failure: Secondary | ICD-10-CM | POA: Diagnosis not present

## 2020-09-19 DIAGNOSIS — M6281 Muscle weakness (generalized): Secondary | ICD-10-CM | POA: Diagnosis not present

## 2020-09-19 DIAGNOSIS — L932 Other local lupus erythematosus: Secondary | ICD-10-CM | POA: Diagnosis not present

## 2020-09-19 DIAGNOSIS — I5042 Chronic combined systolic (congestive) and diastolic (congestive) heart failure: Secondary | ICD-10-CM | POA: Diagnosis not present

## 2020-09-19 DIAGNOSIS — R2689 Other abnormalities of gait and mobility: Secondary | ICD-10-CM | POA: Diagnosis not present

## 2020-09-19 DIAGNOSIS — R279 Unspecified lack of coordination: Secondary | ICD-10-CM | POA: Diagnosis not present

## 2020-09-19 DIAGNOSIS — F0390 Unspecified dementia without behavioral disturbance: Secondary | ICD-10-CM | POA: Diagnosis not present

## 2020-09-21 DIAGNOSIS — R279 Unspecified lack of coordination: Secondary | ICD-10-CM | POA: Diagnosis not present

## 2020-09-21 DIAGNOSIS — F0390 Unspecified dementia without behavioral disturbance: Secondary | ICD-10-CM | POA: Diagnosis not present

## 2020-09-21 DIAGNOSIS — M6281 Muscle weakness (generalized): Secondary | ICD-10-CM | POA: Diagnosis not present

## 2020-09-21 DIAGNOSIS — R2689 Other abnormalities of gait and mobility: Secondary | ICD-10-CM | POA: Diagnosis not present

## 2020-09-21 DIAGNOSIS — I5042 Chronic combined systolic (congestive) and diastolic (congestive) heart failure: Secondary | ICD-10-CM | POA: Diagnosis not present

## 2020-09-21 DIAGNOSIS — L932 Other local lupus erythematosus: Secondary | ICD-10-CM | POA: Diagnosis not present

## 2020-09-26 DIAGNOSIS — R279 Unspecified lack of coordination: Secondary | ICD-10-CM | POA: Diagnosis not present

## 2020-09-26 DIAGNOSIS — R2689 Other abnormalities of gait and mobility: Secondary | ICD-10-CM | POA: Diagnosis not present

## 2020-09-26 DIAGNOSIS — L932 Other local lupus erythematosus: Secondary | ICD-10-CM | POA: Diagnosis not present

## 2020-09-26 DIAGNOSIS — M6281 Muscle weakness (generalized): Secondary | ICD-10-CM | POA: Diagnosis not present

## 2020-09-26 DIAGNOSIS — I5042 Chronic combined systolic (congestive) and diastolic (congestive) heart failure: Secondary | ICD-10-CM | POA: Diagnosis not present

## 2020-09-26 DIAGNOSIS — F0390 Unspecified dementia without behavioral disturbance: Secondary | ICD-10-CM | POA: Diagnosis not present

## 2020-09-28 DIAGNOSIS — Z9181 History of falling: Secondary | ICD-10-CM | POA: Diagnosis not present

## 2020-09-28 DIAGNOSIS — R279 Unspecified lack of coordination: Secondary | ICD-10-CM | POA: Diagnosis not present

## 2020-09-28 DIAGNOSIS — F0390 Unspecified dementia without behavioral disturbance: Secondary | ICD-10-CM | POA: Diagnosis not present

## 2020-09-28 DIAGNOSIS — R630 Anorexia: Secondary | ICD-10-CM | POA: Diagnosis not present

## 2020-09-28 DIAGNOSIS — I5042 Chronic combined systolic (congestive) and diastolic (congestive) heart failure: Secondary | ICD-10-CM | POA: Diagnosis not present

## 2020-09-28 DIAGNOSIS — L932 Other local lupus erythematosus: Secondary | ICD-10-CM | POA: Diagnosis not present

## 2020-09-28 DIAGNOSIS — R2689 Other abnormalities of gait and mobility: Secondary | ICD-10-CM | POA: Diagnosis not present

## 2020-09-28 DIAGNOSIS — M6281 Muscle weakness (generalized): Secondary | ICD-10-CM | POA: Diagnosis not present

## 2020-10-03 DIAGNOSIS — F0390 Unspecified dementia without behavioral disturbance: Secondary | ICD-10-CM | POA: Diagnosis not present

## 2020-10-03 DIAGNOSIS — R279 Unspecified lack of coordination: Secondary | ICD-10-CM | POA: Diagnosis not present

## 2020-10-03 DIAGNOSIS — M6281 Muscle weakness (generalized): Secondary | ICD-10-CM | POA: Diagnosis not present

## 2020-10-03 DIAGNOSIS — R2689 Other abnormalities of gait and mobility: Secondary | ICD-10-CM | POA: Diagnosis not present

## 2020-10-03 DIAGNOSIS — I5042 Chronic combined systolic (congestive) and diastolic (congestive) heart failure: Secondary | ICD-10-CM | POA: Diagnosis not present

## 2020-10-03 DIAGNOSIS — L932 Other local lupus erythematosus: Secondary | ICD-10-CM | POA: Diagnosis not present

## 2020-10-05 DIAGNOSIS — L932 Other local lupus erythematosus: Secondary | ICD-10-CM | POA: Diagnosis not present

## 2020-10-05 DIAGNOSIS — F0390 Unspecified dementia without behavioral disturbance: Secondary | ICD-10-CM | POA: Diagnosis not present

## 2020-10-05 DIAGNOSIS — R2689 Other abnormalities of gait and mobility: Secondary | ICD-10-CM | POA: Diagnosis not present

## 2020-10-05 DIAGNOSIS — R279 Unspecified lack of coordination: Secondary | ICD-10-CM | POA: Diagnosis not present

## 2020-10-05 DIAGNOSIS — M6281 Muscle weakness (generalized): Secondary | ICD-10-CM | POA: Diagnosis not present

## 2020-10-05 DIAGNOSIS — I5042 Chronic combined systolic (congestive) and diastolic (congestive) heart failure: Secondary | ICD-10-CM | POA: Diagnosis not present

## 2020-10-12 DIAGNOSIS — M6281 Muscle weakness (generalized): Secondary | ICD-10-CM | POA: Diagnosis not present

## 2020-10-12 DIAGNOSIS — L932 Other local lupus erythematosus: Secondary | ICD-10-CM | POA: Diagnosis not present

## 2020-10-12 DIAGNOSIS — F0390 Unspecified dementia without behavioral disturbance: Secondary | ICD-10-CM | POA: Diagnosis not present

## 2020-10-12 DIAGNOSIS — R279 Unspecified lack of coordination: Secondary | ICD-10-CM | POA: Diagnosis not present

## 2020-10-12 DIAGNOSIS — R2689 Other abnormalities of gait and mobility: Secondary | ICD-10-CM | POA: Diagnosis not present

## 2020-10-12 DIAGNOSIS — I5042 Chronic combined systolic (congestive) and diastolic (congestive) heart failure: Secondary | ICD-10-CM | POA: Diagnosis not present

## 2020-10-17 DIAGNOSIS — R279 Unspecified lack of coordination: Secondary | ICD-10-CM | POA: Diagnosis not present

## 2020-10-17 DIAGNOSIS — I5042 Chronic combined systolic (congestive) and diastolic (congestive) heart failure: Secondary | ICD-10-CM | POA: Diagnosis not present

## 2020-10-17 DIAGNOSIS — R2689 Other abnormalities of gait and mobility: Secondary | ICD-10-CM | POA: Diagnosis not present

## 2020-10-17 DIAGNOSIS — L932 Other local lupus erythematosus: Secondary | ICD-10-CM | POA: Diagnosis not present

## 2020-10-17 DIAGNOSIS — F0390 Unspecified dementia without behavioral disturbance: Secondary | ICD-10-CM | POA: Diagnosis not present

## 2020-10-17 DIAGNOSIS — M6281 Muscle weakness (generalized): Secondary | ICD-10-CM | POA: Diagnosis not present

## 2020-10-19 DIAGNOSIS — M6281 Muscle weakness (generalized): Secondary | ICD-10-CM | POA: Diagnosis not present

## 2020-10-19 DIAGNOSIS — I5042 Chronic combined systolic (congestive) and diastolic (congestive) heart failure: Secondary | ICD-10-CM | POA: Diagnosis not present

## 2020-10-19 DIAGNOSIS — F0390 Unspecified dementia without behavioral disturbance: Secondary | ICD-10-CM | POA: Diagnosis not present

## 2020-10-19 DIAGNOSIS — L932 Other local lupus erythematosus: Secondary | ICD-10-CM | POA: Diagnosis not present

## 2020-10-19 DIAGNOSIS — R2689 Other abnormalities of gait and mobility: Secondary | ICD-10-CM | POA: Diagnosis not present

## 2020-10-19 DIAGNOSIS — R279 Unspecified lack of coordination: Secondary | ICD-10-CM | POA: Diagnosis not present

## 2020-10-26 DIAGNOSIS — R2689 Other abnormalities of gait and mobility: Secondary | ICD-10-CM | POA: Diagnosis not present

## 2020-10-26 DIAGNOSIS — L932 Other local lupus erythematosus: Secondary | ICD-10-CM | POA: Diagnosis not present

## 2020-10-26 DIAGNOSIS — F0390 Unspecified dementia without behavioral disturbance: Secondary | ICD-10-CM | POA: Diagnosis not present

## 2020-10-26 DIAGNOSIS — I5042 Chronic combined systolic (congestive) and diastolic (congestive) heart failure: Secondary | ICD-10-CM | POA: Diagnosis not present

## 2020-10-26 DIAGNOSIS — R279 Unspecified lack of coordination: Secondary | ICD-10-CM | POA: Diagnosis not present

## 2020-10-26 DIAGNOSIS — M6281 Muscle weakness (generalized): Secondary | ICD-10-CM | POA: Diagnosis not present

## 2021-03-06 ENCOUNTER — Ambulatory Visit: Payer: Medicare Other | Admitting: Cardiovascular Disease

## 2021-03-07 NOTE — Progress Notes (Signed)
Date:  03/08/2021   ID:  Linda Hart, DOB 1925/06/04, MRN [id number]  Patient Location:  [place] [address]   Provider location:   [facility], [place] office  PCP:  Kirk Ruths, MD  Cardiologist:  Arvid Right [facility]  Chief Complaint  Patient presents with  . Follow-up    6 Months follow up. Medications verbally reviewed with patient and daughter, compared with list from [facility].     History of Present Illness:    Linda Hart is a 87 y.o. female  past medical history of CAD (NSTEMI 03/06/13 s/p DES-LAD),  PAF (in setting of NSTEMI and reperfusion), not on anticoagulation given frequent falls ischemic cardiomyopathy (EF 30-35% on 03/09/13 echo), up to 40 to 45% November 2016 SVT (self-limited on OP monitor),  GERD, remote h/o lightheadedness and syncope who was re-admitted to [facility] with worsening DOE/SOB may 2014.   She lives at the [place] EF 45% She presents today for follow-up of her coronary artery disease, cardiomyopathy  Presents with her daughter today Lives at the [facility] Continued weight loss  One episode of tachcardia after standing up, sat back down on her bed, symptoms quickly resolved Otherwise denies frequent tachypalpitations  Far walk to front door , 700 steps, gets winded when getting to the door Does low-grade exercises on her hallway with other residents twice a week,  Daughter reports she does stay active with some walking, gets out some when she visits  Some social interaction Plays cards  Does not like food, losing weight Down 10 pounds in a few years Daughter brings her lots of snacks  Denies chest pain or shortness of breath on regular basis No recent lab work, Labs ordered today  Lasix sparingly  EKG personally reviewed by myself on todays visit Normal sinus rhythm rate 78 bpm no significant ST-T wave changes  Previous records  reviewed Recent fall,  hip fx 07/2017 Required surgery Subsequent Anemia UTI, hypotension in the hospital treated with IV antibiotics then Keflex Slow recovery in rehab per the daughter Reports that she is doing much better, ambulating with walker with no assistance Lost 10 pounds during October, trying to get this weight back  Had severe leg swelling  during hospitalization this has slowly recovered Wears compression hose Anemia resolved Aspirin and brilinta were held at discharge and not restarted  Prior history of falls Fall 2 x, kitchen in MAY 2018, And getting gout of the bath tub the same week  She was admitted 03/05/13 with NSTEMI.  cardiac catheterization revealing tubular 90% prox extending to mid LAD stenosis s/p DES, mild atheresclerosis in LCx, RCA.  Post-cath echo showed EF 30-35%, severe anterior, septal and apical HK, mild TR/MR, mildly elevated EF described as reduced. DAPT- ASA/Brilinta x 12 months recommended.  Cr did bump to 1.5-1.6 suspected to be secondary to contrast induced precluding the addition of ACEi/ARB. Limited repeat echo indicated LVEF 30-35%. She declined LifeVest.   After discharge from the hospital, creatinine was >2, Lasix was held. Creatinine in June 2014 improved to 1.5.  Previous event monitor showed frequent supraventricular ectopy, rare PVCs.  She had short runs of SVT, the longest was 11 beats. She had 46 short runs. Uncertain if she was symptomatic during these short runs of SVT   Prior CV studies:   The following studies were reviewed today:    Past Medical History:  Diagnosis Date  .  Coronary artery disease 03/06/13   90% prox LAD stenosis s/p DES  . Difficult intubation   . Dizziness   . GERD (gastroesophageal reflux disease)   . Hypotension   . Ischemic cardiomyopathy 03/09/13   s/p NSTEMI. EF 30-35%.  . MI (myocardial infarction) ([place])   . PAF (paroxysmal atrial fibrillation) ([place]) 03/06/13   In setting of NSTEMI  and reperfusion, no recurrence  . SVT (supraventricular tachycardia) (HCC)    Self-limited on outpatient cardiac monitor   Past Surgical History:  Procedure Laterality Date  . CESAREAN SECTION    . CORONARY ANGIOPLASTY WITH STENT PLACEMENT  02/2013   DES-mid LAD, mild LCx, RCA stenoses  . INTRAMEDULLARY (IM) NAIL INTERTROCHANTERIC Right 08/19/2017   Procedure: INTRAMEDULLARY (IM) NAIL INTERTROCHANTRIC;  Surgeon: Hessie Knows, MD;  Location: [facility] ORS;  Service: Orthopedics;  Laterality: Right;     Current Meds  Medication Sig  . Ascorbic Acid (VITAMIN C) 1000 MG tablet Take 1,000 mg by mouth daily.  Marland Kitchen aspirin EC 81 MG tablet Take 1 tablet (81 mg total) by mouth daily.  . carvedilol (COREG) 3.125 MG tablet TAKE ONE TABLET TWICE A DAY WITH MEALS.  Marland Kitchen Cholecalciferol 4000 units CAPS Take 1 capsule by mouth every other day.  . nitroGLYCERIN (NITROSTAT) 0.4 MG SL tablet Place 0.4 mg under the tongue every 5 (five) minutes as needed for chest pain.  . Nutritional Supplements (ENSURE ENLIVE PO) Take 1 Bottle by mouth. Every other day  . omeprazole (PRILOSEC) 20 MG capsule Take 20 mg by mouth daily.  Marland Kitchen spironolactone (ALDACTONE) 25 MG tablet Take 12.5 mg by mouth daily. 0.5 tablet to = 12.5 mg  . vitamin B-12 (CYANOCOBALAMIN) 1000 MCG tablet Take 1,000 mcg by mouth daily.      Allergies:   Penicillins   Social History   Tobacco Use  . Smoking status: Never Smoker  . Smokeless tobacco: Never Used  Vaping Use  . Vaping Use: Never used  Substance Use Topics  . Alcohol use: No    Comment: occasional  . Drug use: No       Family Hx: The patient's family history includes Heart attack in her father; Heart failure in her father and mother.  ROS:   Please see the history of present illness.    Review of Systems  Constitutional: Negative.   HENT: Negative.   Respiratory: Negative.   Cardiovascular: Positive for palpitations.  Gastrointestinal: Negative.   Musculoskeletal: Negative.         Gait instability  Neurological: Negative.   Psychiatric/Behavioral: Negative.   All other systems reviewed and are negative.    Labs/Other Tests and Data Reviewed:    Recent Labs: 09/06/2020: ALT 14; BUN 31; Creatinine, Ser 1.24; Hemoglobin 12.4; Platelets 275; Potassium 5.0; Sodium 135   Recent Lipid Panel Lab Results  Component Value Date/Time   CHOL 98 09/09/2018 06:00 AM   CHOL 121 03/06/2013 03:43 AM   TRIG 26 09/09/2018 06:00 AM   TRIG 45 03/06/2013 03:43 AM   HDL 55 09/09/2018 06:00 AM   HDL 60 03/06/2013 03:43 AM   CHOLHDL 1.8 09/09/2018 06:00 AM   LDLCALC 38 09/09/2018 06:00 AM   LDLCALC 52 03/06/2013 03:43 AM   LDLDIRECT 60.0 08/23/2016 03:52 PM    Wt Readings from Last 3 Encounters:  03/08/21 87 lb (39.5 kg)  09/06/20 89 lb (40.4 kg)  02/15/20 90 lb 6 oz (41 kg)     Exam:    BP (!) 92/58 (BP Location: Right  Arm, Patient Position: Sitting, Cuff Size: Normal)   Pulse 78   Ht 5' (1.524 m)   Wt 87 lb (39.5 kg)   SpO2 95%   BMI 16.99 kg/m  Constitutional:   Thin, oriented to person, place, and time. No distress.  HENT:  Head: Grossly normal Eyes:  no discharge. No scleral icterus.  Neck: No JVD, no carotid bruits  Cardiovascular: Regular rate and rhythm, no murmurs appreciated Pulmonary/Chest: Clear to auscultation bilaterally, no wheezes or rails Abdominal: Soft.  no distension.  no tenderness.  Musculoskeletal: Normal range of motion Neurological:  normal muscle tone. Coordination normal. No atrophy Skin: Skin warm and dry Psychiatric: normal affect, pleasant   ASSESSMENT & PLAN:    SVT (supraventricular tachycardia) (HCC) Occasional palpitations, relatively well controlled on carvedilol 1 recent episode but resolved very quickly  PAF (paroxysmal atrial fibrillation) (HCC) Not a good candidate for anticoagulation given high fall risk Maintaining normal sinus rhythm, continue carvedilol For any orthostasis symptoms may need to change her  carvedilol to metoprolol but as she is asymptomatic, no changes at this time  Severe protein calorie malnutrition Weight continues to drop now 87 pounds Stressed importance of higher calorie intake Does not like the food at [employer]  Cardiomyopathy, ischemic Appears euvolemic, denies shortness of breath apart from heavy exertion, long walk Lab work pending Low blood pressure limiting additional medications  atherosclerosis of native coronary artery of native heart with stable angina pectoris (HCC) Currently with no symptoms of angina. No further workup at this time. Continue current medication regimen.  Essential hypertension Hypotension actually noted on today's visit, Push fluids, watch for orthostasis symptoms  Systolic and diastolic CHF, acute on chronic (HCC) Appears euvolemic, No medication changes given low blood pressure and weight loss   Total encounter time more than 25 minutes  Greater than 50% was spent in counseling and coordination of care with the patient    Signed, Ida Rogue, MD  03/08/2021 9:31 AM    [place] Office [address], [place], [place] [address]

## 2021-03-08 ENCOUNTER — Ambulatory Visit (INDEPENDENT_AMBULATORY_CARE_PROVIDER_SITE_OTHER): Payer: Medicare Other | Admitting: Cardiovascular Disease

## 2021-03-08 ENCOUNTER — Other Ambulatory Visit
Admission: RE | Admit: 2021-03-08 | Discharge: 2021-03-08 | Disposition: A | Discharge status: Home / Self Care | Payer: Medicare Other | Source: Ambulatory Visit | Attending: Cardiovascular Disease | Admitting: Cardiovascular Disease

## 2021-03-08 ENCOUNTER — Other Ambulatory Visit: Payer: Self-pay

## 2021-03-08 ENCOUNTER — Encounter: Payer: Self-pay | Admitting: Cardiovascular Disease

## 2021-03-08 VITALS — BP 92/58 | HR 78 | Ht 60.0 in | Wt 87.0 lb

## 2021-03-08 DIAGNOSIS — I480 Paroxysmal atrial fibrillation: Secondary | ICD-10-CM | POA: Insufficient documentation

## 2021-03-08 DIAGNOSIS — I255 Ischemic cardiomyopathy: Secondary | ICD-10-CM | POA: Diagnosis not present

## 2021-03-08 DIAGNOSIS — I471 Supraventricular tachycardia: Secondary | ICD-10-CM

## 2021-03-08 DIAGNOSIS — I5043 Acute on chronic combined systolic (congestive) and diastolic (congestive) heart failure: Secondary | ICD-10-CM

## 2021-03-08 DIAGNOSIS — I25118 Atherosclerotic heart disease of native coronary artery with other forms of angina pectoris: Secondary | ICD-10-CM | POA: Diagnosis not present

## 2021-03-08 DIAGNOSIS — I10 Essential (primary) hypertension: Secondary | ICD-10-CM

## 2021-03-08 LAB — CBC
HCT: 39.4 % (ref 36.0–46.0)
Hemoglobin: 12.6 g/dL (ref 12.0–15.0)
MCH: 31 pg (ref 26.0–34.0)
MCHC: 32 g/dL (ref 30.0–36.0)
MCV: 96.8 fL (ref 80.0–100.0)
Platelets: 228 10*3/uL (ref 150–400)
RBC: 4.07 MIL/uL (ref 3.87–5.11)
RDW: 12.7 % (ref 11.5–15.5)
WBC: 9.4 10*3/uL (ref 4.0–10.5)
nRBC: 0 % (ref 0.0–0.2)

## 2021-03-08 LAB — COMPREHENSIVE METABOLIC PANEL
ALT: 14 U/L (ref 0–44)
AST: 27 U/L (ref 15–41)
Albumin: 3.4 g/dL — ABNORMAL LOW (ref 3.5–5.0)
Alkaline Phosphatase: 91 U/L (ref 38–126)
Anion gap: 7 (ref 5–15)
BUN: 29 mg/dL — ABNORMAL HIGH (ref 8–23)
CO2: 27 mmol/L (ref 22–32)
Calcium: 9.2 mg/dL (ref 8.9–10.3)
Chloride: 101 mmol/L (ref 98–111)
Creatinine, Ser: 1.29 mg/dL — ABNORMAL HIGH (ref 0.44–1.00)
GFR, Estimated: 39 mL/min — ABNORMAL LOW (ref >60)
Glucose, Bld: 108 mg/dL — ABNORMAL HIGH (ref 70–99)
Potassium: 5.1 mmol/L (ref 3.5–5.1)
Sodium: 135 mmol/L (ref 135–145)
Total Bilirubin: 1.1 mg/dL (ref 0.3–1.2)
Total Protein: 6.6 g/dL (ref 6.5–8.1)

## 2021-03-08 LAB — TSH: TSH: 8.344 u[IU]/mL — ABNORMAL HIGH (ref 0.350–4.500)

## 2021-03-08 NOTE — Patient Instructions (Addendum)
Medication Instructions:  No changes  If you need a refill on your cardiac medications before your next appointment, please call your pharmacy.    Lab work: CMP, TSH  and CBC   LABS WILL APPEAR ON MYCHART, ABNORMAL RESULTS WILL BE CALLED   Testing/Procedures: No new testing needed   Follow-Up:  . You will need a follow up appointment in 12 months  . Providers on your designated Care Team:   . Murray Hodgkins, NP . Christell Faith, PA-C . Marrianne Mood, PA-C   COVID-19 Vaccine Information can be found at: [email or web address].uk For questions related to vaccine distribution or appointments, please email [email or web address] or call [phone].

## 2021-03-22 NOTE — Telephone Encounter (Signed)
Daughter Juliann Pulse on dpr calling to confirm results were sent to kc Dr. Ouida Sills for review.  Unable to confirm per note.  Sent via [employer] to [employer] on file.

## 2021-04-21 ENCOUNTER — Emergency Department: Payer: Medicare Other

## 2021-04-21 ENCOUNTER — Other Ambulatory Visit: Payer: Self-pay

## 2021-04-21 ENCOUNTER — Inpatient Hospital Stay
Admission: EM | Admit: 2021-04-21 | Discharge: 2021-04-27 | DRG: 299 | Disposition: A | Discharge status: Skilled Nursing Facility | Payer: Medicare Other | Source: Skilled Nursing Facility | Attending: Internal Medicine | Admitting: Internal Medicine

## 2021-04-21 DIAGNOSIS — Z880 Allergy status to penicillin: Secondary | ICD-10-CM

## 2021-04-21 DIAGNOSIS — J9811 Atelectasis: Secondary | ICD-10-CM | POA: Diagnosis not present

## 2021-04-21 DIAGNOSIS — I82412 Acute embolism and thrombosis of left femoral vein: Secondary | ICD-10-CM | POA: Diagnosis not present

## 2021-04-21 DIAGNOSIS — I130 Hypertensive heart and chronic kidney disease with heart failure and stage 1 through stage 4 chronic kidney disease, or unspecified chronic kidney disease: Secondary | ICD-10-CM | POA: Diagnosis present

## 2021-04-21 DIAGNOSIS — E43 Unspecified severe protein-calorie malnutrition: Secondary | ICD-10-CM | POA: Diagnosis present

## 2021-04-21 DIAGNOSIS — Z681 Body mass index (BMI) 19 or less, adult: Secondary | ICD-10-CM

## 2021-04-21 DIAGNOSIS — R0602 Shortness of breath: Secondary | ICD-10-CM

## 2021-04-21 DIAGNOSIS — J9601 Acute respiratory failure with hypoxia: Secondary | ICD-10-CM | POA: Diagnosis not present

## 2021-04-21 DIAGNOSIS — I824Y2 Acute embolism and thrombosis of unspecified deep veins of left proximal lower extremity: Secondary | ICD-10-CM | POA: Diagnosis not present

## 2021-04-21 DIAGNOSIS — I2699 Other pulmonary embolism without acute cor pulmonale: Secondary | ICD-10-CM | POA: Diagnosis present

## 2021-04-21 DIAGNOSIS — I5042 Chronic combined systolic (congestive) and diastolic (congestive) heart failure: Secondary | ICD-10-CM | POA: Diagnosis present

## 2021-04-21 DIAGNOSIS — N1832 Chronic kidney disease, stage 3b: Secondary | ICD-10-CM | POA: Diagnosis present

## 2021-04-21 DIAGNOSIS — Z20822 Contact with and (suspected) exposure to covid-19: Secondary | ICD-10-CM | POA: Diagnosis present

## 2021-04-21 DIAGNOSIS — Z66 Do not resuscitate: Secondary | ICD-10-CM | POA: Diagnosis present

## 2021-04-21 DIAGNOSIS — F0390 Unspecified dementia without behavioral disturbance: Secondary | ICD-10-CM | POA: Diagnosis present

## 2021-04-21 DIAGNOSIS — Z79890 Hormone replacement therapy (postmenopausal): Secondary | ICD-10-CM

## 2021-04-21 DIAGNOSIS — Z8249 Family history of ischemic heart disease and other diseases of the circulatory system: Secondary | ICD-10-CM

## 2021-04-21 DIAGNOSIS — Z79899 Other long term (current) drug therapy: Secondary | ICD-10-CM

## 2021-04-21 DIAGNOSIS — I480 Paroxysmal atrial fibrillation: Secondary | ICD-10-CM | POA: Diagnosis present

## 2021-04-21 DIAGNOSIS — E875 Hyperkalemia: Secondary | ICD-10-CM | POA: Diagnosis present

## 2021-04-21 DIAGNOSIS — I255 Ischemic cardiomyopathy: Secondary | ICD-10-CM | POA: Diagnosis present

## 2021-04-21 DIAGNOSIS — J449 Chronic obstructive pulmonary disease, unspecified: Secondary | ICD-10-CM | POA: Diagnosis present

## 2021-04-21 DIAGNOSIS — I444 Left anterior fascicular block: Secondary | ICD-10-CM | POA: Diagnosis present

## 2021-04-21 DIAGNOSIS — I5043 Acute on chronic combined systolic (congestive) and diastolic (congestive) heart failure: Secondary | ICD-10-CM | POA: Diagnosis not present

## 2021-04-21 DIAGNOSIS — I252 Old myocardial infarction: Secondary | ICD-10-CM

## 2021-04-21 DIAGNOSIS — I471 Supraventricular tachycardia: Secondary | ICD-10-CM | POA: Diagnosis present

## 2021-04-21 DIAGNOSIS — Z955 Presence of coronary angioplasty implant and graft: Secondary | ICD-10-CM

## 2021-04-21 DIAGNOSIS — N179 Acute kidney failure, unspecified: Secondary | ICD-10-CM | POA: Diagnosis not present

## 2021-04-21 DIAGNOSIS — I2510 Atherosclerotic heart disease of native coronary artery without angina pectoris: Secondary | ICD-10-CM | POA: Diagnosis present

## 2021-04-21 DIAGNOSIS — K219 Gastro-esophageal reflux disease without esophagitis: Secondary | ICD-10-CM | POA: Diagnosis present

## 2021-04-21 DIAGNOSIS — Z7982 Long term (current) use of aspirin: Secondary | ICD-10-CM

## 2021-04-21 DIAGNOSIS — U071 COVID-19: Secondary | ICD-10-CM | POA: Diagnosis not present

## 2021-04-21 DIAGNOSIS — I82402 Acute embolism and thrombosis of unspecified deep veins of left lower extremity: Secondary | ICD-10-CM | POA: Diagnosis present

## 2021-04-21 DIAGNOSIS — R0902 Hypoxemia: Secondary | ICD-10-CM | POA: Diagnosis present

## 2021-04-21 DIAGNOSIS — I248 Other forms of acute ischemic heart disease: Secondary | ICD-10-CM | POA: Diagnosis present

## 2021-04-21 HISTORY — DX: Chronic obstructive pulmonary disease, unspecified: J44.9

## 2021-04-21 LAB — URINALYSIS, COMPLETE (UACMP) WITH MICROSCOPIC
Bilirubin Urine: NEGATIVE
Glucose, UA: NEGATIVE mg/dL
Hgb urine dipstick: NEGATIVE
Ketones, ur: NEGATIVE mg/dL
Nitrite: NEGATIVE
Protein, ur: 30 mg/dL — AB
Specific Gravity, Urine: 1.024 (ref 1.005–1.030)
pH: 5 (ref 5.0–8.0)

## 2021-04-21 LAB — CBC
HCT: 39.2 % (ref 36.0–46.0)
Hemoglobin: 12.9 g/dL (ref 12.0–15.0)
MCH: 31.9 pg (ref 26.0–34.0)
MCHC: 32.9 g/dL (ref 30.0–36.0)
MCV: 96.8 fL (ref 80.0–100.0)
Platelets: 231 10*3/uL (ref 150–400)
RBC: 4.05 MIL/uL (ref 3.87–5.11)
RDW: 13.2 % (ref 11.5–15.5)
WBC: 18.6 10*3/uL — ABNORMAL HIGH (ref 4.0–10.5)
nRBC: 0 % (ref 0.0–0.2)

## 2021-04-21 LAB — COMPREHENSIVE METABOLIC PANEL
ALT: 16 U/L (ref 0–44)
AST: 28 U/L (ref 15–41)
Albumin: 3.4 g/dL — ABNORMAL LOW (ref 3.5–5.0)
Alkaline Phosphatase: 83 U/L (ref 38–126)
Anion gap: 8 (ref 5–15)
BUN: 40 mg/dL — ABNORMAL HIGH (ref 8–23)
CO2: 24 mmol/L (ref 22–32)
Calcium: 9.1 mg/dL (ref 8.9–10.3)
Chloride: 101 mmol/L (ref 98–111)
Creatinine, Ser: 1.5 mg/dL — ABNORMAL HIGH (ref 0.44–1.00)
GFR, Estimated: 32 mL/min — ABNORMAL LOW (ref >60)
Glucose, Bld: 211 mg/dL — ABNORMAL HIGH (ref 70–99)
Potassium: 5.1 mmol/L (ref 3.5–5.1)
Sodium: 133 mmol/L — ABNORMAL LOW (ref 135–145)
Total Bilirubin: 1.6 mg/dL — ABNORMAL HIGH (ref 0.3–1.2)
Total Protein: 6.6 g/dL (ref 6.5–8.1)

## 2021-04-21 LAB — TROPONIN I (HIGH SENSITIVITY)
Troponin I (High Sensitivity): 239 ng/L (ref <18)
Troponin I (High Sensitivity): 366 ng/L (ref <18)

## 2021-04-21 LAB — PROTIME-INR
INR: 1.1 (ref 0.8–1.2)
Prothrombin Time: 14 seconds (ref 11.4–15.2)

## 2021-04-21 LAB — RESP PANEL BY RT-PCR (FLU A&B, COVID) ARPGX2
Influenza A by PCR: NEGATIVE
Influenza B by PCR: NEGATIVE
SARS Coronavirus 2 by RT PCR: NEGATIVE

## 2021-04-21 LAB — BRAIN NATRIURETIC PEPTIDE: B Natriuretic Peptide: 2796.4 pg/mL — ABNORMAL HIGH (ref 0.0–100.0)

## 2021-04-21 LAB — APTT: aPTT: 26 seconds (ref 24–36)

## 2021-04-21 MED ORDER — CARVEDILOL 3.125 MG PO TABS
3.1250 mg | ORAL_TABLET | Freq: Two times a day (BID) | ORAL | Status: DC
  Administered 2021-04-21 – 2021-04-25 (×8): 3.125 mg via ORAL
  Filled 2021-04-21 (×8): qty 1

## 2021-04-21 MED ORDER — ASPIRIN EC 81 MG PO TBEC
81.0000 mg | DELAYED_RELEASE_TABLET | Freq: Every day | ORAL | Status: DC
  Administered 2021-04-22 – 2021-04-24 (×3): 81 mg via ORAL
  Filled 2021-04-21 (×4): qty 1

## 2021-04-21 MED ORDER — ENSURE ENLIVE PO LIQD
1.0000 | ORAL | Status: DC
  Administered 2021-04-22 – 2021-04-24 (×2): 237 mL via ORAL

## 2021-04-21 MED ORDER — VITAMIN B-12 1000 MCG PO TABS
1000.0000 ug | ORAL_TABLET | Freq: Every day | ORAL | Status: DC
  Administered 2021-04-22 – 2021-04-27 (×6): 1000 ug via ORAL
  Filled 2021-04-21 (×6): qty 1

## 2021-04-21 MED ORDER — SENNOSIDES-DOCUSATE SODIUM 8.6-50 MG PO TABS: 1.0000 | ORAL_TABLET | Freq: Every evening | ORAL | Status: DC | PRN

## 2021-04-21 MED ORDER — ACETAMINOPHEN 650 MG RE SUPP: 650.0000 mg | Freq: Four times a day (QID) | RECTAL | Status: DC | PRN

## 2021-04-21 MED ORDER — ONDANSETRON HCL 4 MG PO TABS: 4.0000 mg | ORAL_TABLET | Freq: Four times a day (QID) | ORAL | Status: DC | PRN

## 2021-04-21 MED ORDER — ONDANSETRON HCL 4 MG/2ML IJ SOLN
4.0000 mg | Freq: Four times a day (QID) | INTRAMUSCULAR | Status: DC | PRN
  Administered 2021-04-22 – 2021-04-24 (×3): 4 mg via INTRAVENOUS
  Filled 2021-04-21 (×3): qty 2

## 2021-04-21 MED ORDER — MAGNESIUM CITRATE PO SOLN
1.0000 | Freq: Once | ORAL | Status: DC | PRN
  Filled 2021-04-21: qty 296

## 2021-04-21 MED ORDER — BISACODYL 5 MG PO TBEC
5.0000 mg | DELAYED_RELEASE_TABLET | Freq: Every day | ORAL | Status: DC | PRN
  Administered 2021-04-25: 5 mg via ORAL
  Filled 2021-04-21: qty 1

## 2021-04-21 MED ORDER — HYDROCODONE-ACETAMINOPHEN 5-325 MG PO TABS: 1.0000 | ORAL_TABLET | ORAL | Status: DC | PRN

## 2021-04-21 MED ORDER — LACTATED RINGERS IV SOLN: INTRAVENOUS | Status: DC

## 2021-04-21 MED ORDER — LEVOTHYROXINE SODIUM 25 MCG PO TABS
25.0000 ug | ORAL_TABLET | Freq: Every day | ORAL | Status: DC
  Administered 2021-04-24 – 2021-04-27 (×4): 25 ug via ORAL
  Filled 2021-04-21 (×5): qty 1

## 2021-04-21 MED ORDER — FAMOTIDINE IN NACL 20-0.9 MG/50ML-% IV SOLN
20.0000 mg | Freq: Once | INTRAVENOUS | Status: AC
  Administered 2021-04-21: 20 mg via INTRAVENOUS
  Filled 2021-04-21: qty 50

## 2021-04-21 MED ORDER — HEPARIN BOLUS VIA INFUSION
3500.0000 [IU] | Freq: Once | INTRAVENOUS | Status: AC
  Administered 2021-04-21: 3500 [IU] via INTRAVENOUS
  Filled 2021-04-21: qty 3500

## 2021-04-21 MED ORDER — ASPIRIN 81 MG PO CHEW
324.0000 mg | CHEWABLE_TABLET | Freq: Once | ORAL | Status: AC
  Administered 2021-04-21: 324 mg via ORAL
  Filled 2021-04-21: qty 4

## 2021-04-21 MED ORDER — ACETAMINOPHEN 325 MG PO TABS
650.0000 mg | ORAL_TABLET | Freq: Four times a day (QID) | ORAL | Status: DC | PRN
  Administered 2021-04-24: 650 mg via ORAL

## 2021-04-21 MED ORDER — ASCORBIC ACID 500 MG PO TABS
1000.0000 mg | ORAL_TABLET | Freq: Every day | ORAL | Status: DC
  Administered 2021-04-22 – 2021-04-27 (×6): 1000 mg via ORAL
  Filled 2021-04-21 (×6): qty 2

## 2021-04-21 MED ORDER — NITROGLYCERIN 0.4 MG SL SUBL: 0.4000 mg | SUBLINGUAL_TABLET | SUBLINGUAL | Status: DC | PRN

## 2021-04-21 MED ORDER — MORPHINE SULFATE (PF) 2 MG/ML IV SOLN: 2.0000 mg | INTRAVENOUS | Status: DC | PRN

## 2021-04-21 MED ORDER — HEPARIN (PORCINE) 25000 UT/250ML-% IV SOLN
700.0000 [IU]/h | INTRAVENOUS | Status: AC
  Administered 2021-04-21: 800 [IU]/h via INTRAVENOUS
  Administered 2021-04-23: 700 [IU]/h via INTRAVENOUS
  Filled 2021-04-21 (×2): qty 250

## 2021-04-21 MED ORDER — ATORVASTATIN CALCIUM 20 MG PO TABS
40.0000 mg | ORAL_TABLET | Freq: Every day | ORAL | Status: DC
  Administered 2021-04-22 – 2021-04-27 (×6): 40 mg via ORAL
  Filled 2021-04-21 (×6): qty 2

## 2021-04-21 MED ORDER — SODIUM CHLORIDE 0.9 % IV SOLN: Freq: Once | INTRAVENOUS | Status: AC

## 2021-04-21 MED ORDER — METOPROLOL TARTRATE 5 MG/5ML IV SOLN
5.0000 mg | Freq: Four times a day (QID) | INTRAVENOUS | Status: DC | PRN
  Administered 2021-04-25: 5 mg via INTRAVENOUS
  Filled 2021-04-21: qty 5

## 2021-04-21 MED ORDER — SPIRONOLACTONE 25 MG PO TABS
12.5000 mg | ORAL_TABLET | Freq: Every day | ORAL | Status: DC
  Administered 2021-04-22 – 2021-04-24 (×3): 12.5 mg via ORAL
  Filled 2021-04-21 (×2): qty 1
  Filled 2021-04-21 (×3): qty 0.5

## 2021-04-21 MED ORDER — PANTOPRAZOLE SODIUM 40 MG IV SOLR
40.0000 mg | Freq: Two times a day (BID) | INTRAVENOUS | Status: DC
  Administered 2021-04-21 – 2021-04-24 (×6): 40 mg via INTRAVENOUS
  Filled 2021-04-21 (×6): qty 40

## 2021-04-21 NOTE — ED Notes (Signed)
Pt's daughter to bedside ° °

## 2021-04-21 NOTE — ED Notes (Signed)
Pt's daughter Belenda Cruise leaving for the night. Requested that staff notify her of any major changes at [phone].

## 2021-04-21 NOTE — ED Notes (Signed)
Pt laying on side asleep when BP cuff went off.

## 2021-04-21 NOTE — ED Notes (Signed)
Pt given a warm blanket. Visitor remains at bedside.

## 2021-04-21 NOTE — ED Notes (Signed)
Report received from [place], [place]

## 2021-04-21 NOTE — ED Provider Notes (Signed)
-----------------------------------------   6:27 PM on 04/21/2021 ----------------------------------------- I have personally seen and evaluated the patient.  Patient currently appears well, lying in bed in no distress however patient is mildly tachypneic around 25 breaths/min satting between 89 and 91% on room air with an elevated heart rate around 110 bpm.  Patient's ultrasound has resulted positive for nonocclusive DVT.  Given the patient's dyspnea much worse with exertion elevated troponin and positive ultrasound, we will cover with heparin for presumed pulmonary embolism.  Patient's kidney function is quite borderline currently we will continue to IV hydrate the patient and hopefully with kidney function improvement we could proceed with CTA tonight or tomorrow to confirm PE.  I discussed this with the patient and daughter who are agreeable to this plan of care.   Harvest Dark, MD 04/21/21 [phone]

## 2021-04-21 NOTE — ED Provider Notes (Addendum)
[facility] Emergency Department Provider Note ____________________________________________   Event Date/Time   First MD Initiated Contact with Patient 04/21/21 1401     (approximate)  I have reviewed the triage vital signs and the nursing notes.   HISTORY  Chief Complaint No chief complaint on file.  HPI Linda Hart is a 87 y.o. female with history of dementia, A. fib, CHF, CAD, MI, hypertension presents to the emergency department for treatment and evaluation of shortness of breath and increase in edema of left lower extremity. She had complained of chest pain last night and was given 1 NTG. Nursing facility decided to send her out today for ER evaluation. Patient is confused at baseline and unable to provide recent HPI.     Past Medical History:  Diagnosis Date   COPD (chronic obstructive pulmonary disease) ([place])    Coronary artery disease 03/06/2013   90% prox LAD stenosis s/p DES   Difficult intubation    Dizziness    GERD (gastroesophageal reflux disease)    Hypotension    Ischemic cardiomyopathy 03/09/2013   s/p NSTEMI. EF 30-35%.   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   GERD without esophagitis 05/12/2018   Closed right hip fracture ([place]) 08/17/2017   Cutaneous lupus erythematosus 04/05/2017   Anorexia 04/20/2016   Bradycardia 04/20/2016   Chronic combined systolic and diastolic heart failure ([place]) 09/24/2015   Anemia 01/26/2014   Dry eyes 01/04/2014   Cardiomyopathy, ischemic 03/17/2013   Hyperlipidemia 03/17/2013   CAD in native artery 03/17/2013   PAF (paroxysmal atrial fibrillation) ([place]) 03/17/2013   Pernicious anemia 07/10/2011   SVT  (supraventricular tachycardia) ([place]) 03/21/2011    Past Surgical History:  Procedure Laterality Date   CESAREAN SECTION     CORONARY ANGIOPLASTY WITH STENT PLACEMENT  02/2013   DES-mid LAD, mild LCx, RCA stenoses   INTRAMEDULLARY (IM) NAIL INTERTROCHANTERIC Right 08/19/2017   Procedure: INTRAMEDULLARY (IM) NAIL INTERTROCHANTRIC;  Surgeon: Hessie Knows, MD;  Location: [facility] ORS;  Service: Orthopedics;  Laterality: Right;    Prior to Admission medications   Medication Sig Start Date End Date Taking? Authorizing Provider  Ascorbic Acid (VITAMIN C) 1000 MG tablet Take 1,000 mg by mouth daily.   Yes [provider]  aspirin EC 81 MG tablet Take 1 tablet (81 mg total) by mouth daily.   Yes Gollan, Kathlene November, MD  carvedilol (COREG) 3.125 MG tablet TAKE ONE TABLET TWICE A DAY WITH MEALS. Patient taking differently: Take 3.125 mg by mouth 2 (two) times daily. 02/22/17  Yes Leone Haven, MD  carvedilol (COREG) 3.125 MG tablet Take 3.125 mg by mouth daily as needed (tachycardia, palpitations or HR >100).   Yes [provider]  Cholecalciferol 4000 units CAPS Take 4,000 Units by mouth every other day.   Yes [provider]  feeding supplement (ENSURE ENLIVE / ENSURE PLUS) LIQD Take 1 Bottle by mouth every other day.   Yes [provider]  levothyroxine (SYNTHROID) 25 MCG tablet Take 25 mcg by mouth daily.   Yes [provider]  nitroGLYCERIN (NITROSTAT) 0.4 MG SL tablet Place 0.4 mg under the tongue every 5 (five) minutes as needed for chest pain.   Yes  [provider]  omeprazole (PRILOSEC) 20 MG capsule Take 20 mg by mouth daily.   Yes [provider]  spironolactone (ALDACTONE) 25 MG tablet Take 12.5 mg by mouth daily.   Yes [provider]  vitamin B-12 (CYANOCOBALAMIN) 1000 MCG tablet Take 1,000 mcg by mouth daily.  10/30/18  Yes [provider]    Allergies Penicillins  Family History  Problem Relation Age of  Onset   Heart failure Mother    Heart failure Father    Heart attack Father     Social History Social History   Tobacco Use   Smoking status: Never   Smokeless tobacco: Never  Vaping Use   Vaping Use: Never used  Substance Use Topics   Alcohol use: Not Currently    Comment: occasional   Drug use: No    Review of Systems  Level 5 Caveat, confusion ____________________________________________   PHYSICAL EXAM:  VITAL SIGNS: ED Triage Vitals  Enc Vitals Group     BP --      Pulse --      Resp --      Temp 04/21/21 1403 97.6 F (36.4 C)     Temp Source 04/21/21 1403 Oral     SpO2 --      Weight 04/21/21 1404 110 lb (49.9 kg)     Height 04/21/21 1404 5\' 4"  (1.626 m)     Head Circumference --      Peak Flow --      Pain Score 04/21/21 1404 0     Pain Loc --      Pain Edu? --      Excl. in [place]? --     Constitutional: Confused. Chronically ill appearing and in no acute distress. Eyes: Conjunctivae are normal. Head: Atraumatic. Nose: No congestion/rhinnorhea. Mouth/Throat: Mucous membranes are moist.  Oropharynx non-erythematous. Neck: No stridor.   Hematological/Lymphatic/Immunilogical: No cervical lymphadenopathy. Cardiovascular: Normal rate, regular rhythm. Grossly normal heart sounds.  Good peripheral circulation. Pitting edema left lower extremity. Respiratory: Normal respiratory effort.  Slightly tachypneic.  Crackles bilateral bases. Gastrointestinal: Soft and nontender. No distention. No abdominal bruits.  Genitourinary:  Musculoskeletal: Edema of the left lower extremity is present. Neurologic:  Normal speech and language. No gross focal neurologic deficits are appreciated.  Skin:  Skin is warm, dry and intact. No rash noted. Psychiatric: Mood and affect are normal. Speech and behavior are baseline.  ____________________________________________   LABS (all labs ordered are listed, but only abnormal results are displayed)  Labs Reviewed  CBC -  Abnormal; Notable for the following components:      Result Value   WBC 18.6 (*)    All other components within normal limits  COMPREHENSIVE METABOLIC PANEL - Abnormal; Notable for the following components:   Sodium 133 (*)    Glucose, Bld 211 (*)    BUN 40 (*)    Creatinine, Ser 1.50 (*)    Albumin 3.4 (*)    Total Bilirubin 1.6 (*)    GFR, Estimated 32 (*)    All other components within normal limits  BRAIN NATRIURETIC PEPTIDE - Abnormal; Notable for the following components:   B Natriuretic Peptide 2,796.4 (*)    All other components within normal limits  URINALYSIS, COMPLETE (UACMP) WITH MICROSCOPIC - Abnormal; Notable for the following components:   Color, Urine YELLOW (*)    APPearance HAZY (*)    Protein, ur 30 (*)    Leukocytes,Ua SMALL (*)    Bacteria, UA RARE (*)  All other components within normal limits  TROPONIN I (HIGH SENSITIVITY) - Abnormal; Notable for the following components:   Troponin I (High Sensitivity) 239 (*)    All other components within normal limits  RESP PANEL BY RT-PCR (FLU A&B, COVID) ARPGX2  APTT  PROTIME-INR  HEPARIN LEVEL (UNFRACTIONATED)  CBC   ____________________________________________  EKG  ED ECG REPORT I, Waseem Suess, FNP-BC personally viewed and interpreted this ECG.   Date: 04/21/2021  EKG Time: 1409  Rate: 95  Rhythm: normal sinus rhythm, PAC's noted  Axis: normal  Intervals:none  ST&T Change: no ST elevation  ____________________________________________  RADIOLOGY  ED MD interpretation:    No pneumonia or significant effusion  I, Deepak Bless, personally viewed and evaluated these images (plain radiographs) as part of my medical decision making, as well as reviewing the written report by the radiologist.  Official radiology report(s): DG Chest 2 View  Result Date: 04/21/2021 CLINICAL DATA:  Difficulty breathing. Patient unable to specify duration of current complaint. Hx of COPD, CAD, HTN, MI, PAF, SVT.  EXAM: CHEST - 2 VIEW COMPARISON:  12/20/2018 and older exams. FINDINGS: Cardiac silhouette is normal in size. No mediastinal or hilar masses or evidence of adenopathy. Lungs are hyperexpanded. Small bilateral pleural effusions. Additional lung base opacity also noted consistent with atelectasis. Remainder of the lungs is clear. No pneumothorax. Marked compression deformity of what is either T12 or L1, new since the most recent prior study. No other fractures. IMPRESSION: 1. Hyperexpanded lungs with small effusions and mild basilar atelectasis, but no convincing pneumonia or pulmonary edema. 2. Marked compression deformity of a vertebra at the thoracolumbar junction, most likely T12, new since the prior chest radiograph. Electronically Signed   By: Lajean Manes M.D.   On: 04/21/2021 15:21    ____________________________________________   PROCEDURES  Procedure(s) performed (including Critical Care):  Procedures  ____________________________________________   INITIAL IMPRESSION / ASSESSMENT AND PLAN     87 year old female presenting to the emergency department for treatment and evaluation of shortness of breath and chest pain since last night. See HPI. Awaiting arrival of daughter for further information.  DIFFERENTIAL DIAGNOSIS  CHF, CAD, PE, COVID  ED COURSE  Nonocclusive DVT left lower extremity femoral vein. Remains slightly tachypneic and tachycardic with a troponin of 239 and a BN P of nearly 2800.  Concern for PE, however her GFR is 32 therefore not a good candidate for CTA with contrast.  Plan will be to start heparin and give some fluids in hopes of being able to obtain the CTA tomorrow.  Plan will be to admit the patient via hospitalist services.  Patient and daughter are aware and are agreeable to the plan.  Patient accepted for admission via Hospitalist services.   CRITICAL CARE Performed by: Sherrie George   Total critical care time: 45 minutes  Critical care time was  exclusive of separately billable procedures and treating other patients.  Critical care was necessary to treat or prevent imminent or life-threatening deterioration.  Critical care was time spent personally by me on the following activities: development of treatment plan with patient and/or surrogate as well as nursing, discussions with consultants, evaluation of patient's response to treatment, examination of patient, obtaining history from patient or surrogate, ordering and performing treatments and interventions, ordering and review of laboratory studies, ordering and review of radiographic studies, pulse oximetry and re-evaluation of patient's condition.     ___________________________________________   FINAL CLINICAL IMPRESSION(S) / ED DIAGNOSES  Final diagnoses:  Acute deep vein thrombosis (DVT) of left femoral vein [facility])     ED Discharge Orders     None        Linda Hart was evaluated in Emergency Department on 04/21/2021 for the symptoms described in the history of present illness. She was evaluated in the context of the global COVID-19 pandemic, which necessitated consideration that the patient might be at risk for infection with the SARS-CoV-2 virus that causes COVID-19. Institutional protocols and algorithms that pertain to the evaluation of patients at risk for COVID-19 are in a state of rapid change based on information released by regulatory bodies including the CDC and federal and state organizations. These policies and algorithms were followed during the patient's care in the ED.   Note:  This document was prepared using Dragon voice recognition software and may include unintentional dictation errors.    Victorino Dike, FNP 04/21/21 [place], [place], FNP 04/21/21 Ailene Rud, MD 04/21/21 [phone]

## 2021-04-21 NOTE — ED Notes (Signed)
Pt currently denying CP but reports mild chronic back pain. Resp reg/unlabored; swelling in ankles and feet; 1-2+ pitting edema; 93% RA; skin dry; resting calmly in bed.

## 2021-04-21 NOTE — ED Notes (Signed)
Pt assisted to and from stretcher to toilet. HOB and lights adjusted for pt. Pt remains on 2L O2 via [place]. Pt gets very SOB upon exertion. Stretcher locked low. Rails up. Pt adamant her non-slip yellow socks be removed; removed per pt request. Call bell within reach. Pt educated again about how to get this RN. Visitor remains at bedside.

## 2021-04-21 NOTE — H&P (Signed)
History and Physical    Linda Hart DUK:[id number] DOB: 04/02/1925 DOA: 04/21/2021  PCP: Kirk Ruths, MD  Patient coming from: SNF  I have personally briefly reviewed patient's old medical records in [facility].  Chief Complaint: chest pain and shortness of breath  HPI: Linda Hart is a 87 y.o. female with medical history significant for coronary artery disease with stent placement, chronic systolic and diastolic congestive heart failure with EF 40-45% in 2016, COPD, GERD, who presents to the emergency department on 04/21/2021 with shortness of breath.  Patient reports she is tired and she is not giving very much history at all, so history is obtained from the emergency department physician and the medical record.  Per report patient had shortness of breath and chest pain that began on 04/20/2021; at the time of admission patient denies that she had any chest pain, but does admit to shortness of breath.  On 04/20/2021, the patient was given nitroglycerin for her chest pain.  On 04/21/2021, the patient was noted to be short of breath and she was also noted to have swelling in her left lower extremity.  Symptoms were alleviated by nothing and exacerbated by nothing.  Shortness of breath is constant.  Associated symptoms: Per report patient had chest pain on the day before admission.  She has left lower leg swelling.  She denies cough, wheezing, abdominal pain, vomiting, bloody stool.   ED Course: Ultrasound of the left lower extremity revealed a nonocclusive left leg DVT.  There was very high suspicion that patient also had a pulmonary embolism, but her kidney function was decreased, so she did not have a CTA of the chest.  She was started empirically on an IV heparin drip.  She was placed on oxygen by nasal cannula.  ____  History:  Echocardiogram, 2016: Study Conclusions  - Left ventricle: The cavity size was normal. Systolic function was    mildly to moderately reduced. The  estimated ejection fraction was    in the range of 40% to 45%. Mild to moderate hypokinesis of the    anteroseptal myocardium. Doppler parameters are consistent with    abnormal left ventricular relaxation (grade 1 diastolic    dysfunction).  - Aortic valve: There was mild regurgitation.  - Mitral valve: Calcified annulus. There was mild to moderate    regurgitation.  - Left atrium: The atrium was mildly dilated.  - Right ventricle: Systolic function was normal.  - Pulmonary arteries: Systolic pressure was within the normal    range.  Impressions:  - APCs noted. Prior echocardiogram in 2014 with EF 30 to 35%,    Review of Systems: As per HPI otherwise all other systems reviewed and are negative. No Fever, chills, or diaphoresis. Positive for fatigue/malaise.    Past Medical History:  Diagnosis Date   COPD (chronic obstructive pulmonary disease) ([place])    Coronary artery disease 03/06/2013   90% prox LAD stenosis s/p DES   Difficult intubation    Dizziness    GERD (gastroesophageal reflux disease)    Hypotension    Ischemic cardiomyopathy 03/09/2013   s/p NSTEMI. EF 30-35%.   MI (myocardial infarction) ([place])    PAF (paroxysmal atrial fibrillation) ([place]) 03/06/2013   In setting of NSTEMI and reperfusion, no recurrence   SVT (supraventricular tachycardia) ([place])    Self-limited on outpatient cardiac monitor    Past Surgical History:  Procedure Laterality Date   CESAREAN SECTION     CORONARY ANGIOPLASTY WITH STENT PLACEMENT  02/2013  DES-mid LAD, mild LCx, RCA stenoses   INTRAMEDULLARY (IM) NAIL INTERTROCHANTERIC Right 08/19/2017   Procedure: INTRAMEDULLARY (IM) NAIL INTERTROCHANTRIC;  Surgeon: Hessie Knows, MD;  Location: [facility] ORS;  Service: Orthopedics;  Laterality: Right;    Social History  reports that she has never smoked. She has never used smokeless tobacco. She reports previous alcohol use. She reports that she does not use drugs.  Allergies  Allergen Reactions    Penicillins     Has patient had a PCN reaction causing immediate rash, facial/tongue/throat swelling, SOB or lightheadedness with hypotension: Unknown Has patient had a PCN reaction causing severe rash involving mucus membranes or skin necrosis: Unknown Has patient had a PCN reaction that required hospitalization No Has patient had a PCN reaction occurring within the last 10 years: No If all of the above answers are "NO", then may proceed with Cephalosporin use.    Family History  Problem Relation Age of Onset   Heart failure Mother    Heart failure Father    Heart attack Father      Home Medications  Prior to Admission medications   Medication Sig Start Date End Date Taking? Authorizing Provider  Ascorbic Acid (VITAMIN C) 1000 MG tablet Take 1,000 mg by mouth daily.   Yes [provider]  aspirin EC 81 MG tablet Take 1 tablet (81 mg total) by mouth daily.   Yes Gollan, Kathlene November, MD  carvedilol (COREG) 3.125 MG tablet TAKE ONE TABLET TWICE A DAY WITH MEALS. Patient taking differently: Take 3.125 mg by mouth 2 (two) times daily. 02/22/17  Yes Leone Haven, MD  carvedilol (COREG) 3.125 MG tablet Take 3.125 mg by mouth daily as needed (tachycardia, palpitations or HR >100).   Yes [provider]  Cholecalciferol 4000 units CAPS Take 4,000 Units by mouth every other day.   Yes [provider]  feeding supplement (ENSURE ENLIVE / ENSURE PLUS) LIQD Take 1 Bottle by mouth every other day.   Yes [provider]  levothyroxine (SYNTHROID) 25 MCG tablet Take 25 mcg by mouth daily.   Yes [provider]  nitroGLYCERIN (NITROSTAT) 0.4 MG SL tablet Place 0.4 mg under the tongue every 5 (five) minutes as needed for chest pain.   Yes [provider]  omeprazole (PRILOSEC) 20 MG capsule Take 20 mg by mouth daily.   Yes [provider]  spironolactone (ALDACTONE) 25 MG tablet Take 12.5 mg by mouth daily.   Yes [provider]  vitamin B-12 (CYANOCOBALAMIN) 1000 MCG tablet Take 1,000 mcg by mouth daily.  10/30/18  Yes [provider]    Physical Exam: Vitals:   04/21/21 1900 04/21/21 1915 04/21/21 1930 04/21/21 2000  BP:   (!) 189/88   Pulse:  99 (!) 101 64  Resp:  (!) 25 (!) 23   Temp:      TempSrc:      SpO2: 94% 99% 100% 100%  Weight:      Height:        Constitutional: NAD, calm, comfortable, ill-appearing. Vitals:   04/21/21 1900 04/21/21 1915 04/21/21 1930 04/21/21 2000  BP:   (!) 189/88   Pulse:  99 (!) 101 64  Resp:  (!) 25 (!) 23   Temp:      TempSrc:      SpO2: 94% 99% 100% 100%  Weight:      Height:       Eyes: PERRL, lids and conjunctivae without icterus or erythema. ENMT: Mucous membranes are  dry. Posterior pharynx clear of any exudate or lesions. Nares patent without discharge or bleeding.  Normocephalic, atraumatic.  Normal dentition.  Neck: normal, supple, no masses, trachea midline.  Thyroid nontender, no masses appreciated, no thyromegaly. Respiratory: clear to auscultation bilaterally. Chest wall movements are symmetric. No wheezing, no crackles.  No rhonchi.  No accessory muscle use. Decreased breath sounds in the bases bilaterally.  Oxygen by nasal cannula in place.  Intermittent tachypnea. Cardiovascular: Normal S1-S2.  Murmur 3/6 systolic. Intermittent tachypnea.  No rubs / gallops. Pulses: DP pulses 2+ bilaterally. No carotid bruits.  Capillary refill less than 3 seconds.  Edema: 2-3+ edema in the left lower extremity and 1+ edema in the right lower extremity. GI: soft, non-distended, normal active bowel sounds. No hepatosplenomegaly. No rigidity, rebound, or guarding. Non-tender. No masses palpated.  Musculoskeletal: no clubbing / cyanosis. No joint deformity upper and lower extremities. Good ROM, no contractures. Normal muscle tone.  No tenderness or deformity in the back bilaterally. Integument: no rashes, lesions, ulcers. No induration. Clean, dry,  intact. Neurologic: CN 2-12 grossly intact, as best can be determined in this patient who refuses to cooperate with much of the exam. Sensation grossly intact to light touch. DTR 2+ bilaterally.  Babinski: Toes downgoing bilaterally.  Strength at least 3/5 in upper extremities.  Witnessed patient moving lower extremities but refused to participate in strength exam of lower extremities.  Refused to participate in rapid alternating movement or pronator drift exam.   Psychiatric: Poor judgment and insight. Alert and oriented to person.  Slightly agitated affect.   Lymphatic: No cervical lymphadenopathy. No supraclavicular lymphadenopathy.   Labs on Admission: I have personally reviewed the following labs and imaging studies.  CBC: Recent Labs  Lab 04/21/21 1417  WBC 18.6*  HGB 12.9  HCT 39.2  MCV 96.8  PLT 703    Basic Metabolic Panel: Recent Labs  Lab 04/21/21 1417  NA 133*  K 5.1  CL 101  CO2 24  GLUCOSE 211*  BUN 40*  CREATININE 1.50*  CALCIUM 9.1    GFR: Estimated Creatinine Clearance: 18.5 mL/min (A) (by C-G formula based on SCr of 1.5 mg/dL (H)).  Liver Function Tests: Recent Labs  Lab 04/21/21 1417  AST 28  ALT 16  ALKPHOS 83  BILITOT 1.6*  PROT 6.6  ALBUMIN 3.4*    Urine analysis:    Component Value Date/Time   COLORURINE YELLOW (A) 04/21/2021 1728   APPEARANCEUR HAZY (A) 04/21/2021 1728   APPEARANCEUR Hazy 03/06/2013 0429   LABSPEC 1.024 04/21/2021 1728   LABSPEC 1.016 03/06/2013 0429   PHURINE 5.0 04/21/2021 1728   GLUCOSEU NEGATIVE 04/21/2021 1728   GLUCOSEU Negative 03/06/2013 0429   HGBUR NEGATIVE 04/21/2021 1728   BILIRUBINUR NEGATIVE 04/21/2021 1728   BILIRUBINUR Negative 03/06/2013 0429   KETONESUR NEGATIVE 04/21/2021 1728   PROTEINUR 30 (A) 04/21/2021 1728   NITRITE NEGATIVE 04/21/2021 1728   LEUKOCYTESUR SMALL (A) 04/21/2021 1728   LEUKOCYTESUR 3+ 03/06/2013 0429    Radiological Exams on Admission: DG Chest 2 View  Result Date:  04/21/2021 CLINICAL DATA:  Difficulty breathing. Patient unable to specify duration of current complaint. Hx of COPD, CAD, HTN, MI, PAF, SVT. EXAM: CHEST - 2 VIEW COMPARISON:  12/20/2018 and older exams. FINDINGS: Cardiac silhouette is normal in size. No mediastinal or hilar masses or evidence of adenopathy. Lungs are hyperexpanded. Small bilateral pleural effusions. Additional lung base opacity also noted consistent with atelectasis. Remainder of the lungs is clear. No pneumothorax. Marked compression  deformity of what is either T12 or L1, new since the most recent prior study. No other fractures. IMPRESSION: 1. Hyperexpanded lungs with small effusions and mild basilar atelectasis, but no convincing pneumonia or pulmonary edema. 2. Marked compression deformity of a vertebra at the thoracolumbar junction, most likely T12, new since the prior chest radiograph. Electronically Signed   By: Lajean Manes M.D.   On: 04/21/2021 15:21    EKG: Independently reviewed.  95 bpm.  Sinus tachycardia.  LAFB.  Slight J-point elevation in lead V3.  Anterior infarct, age undetermined.  Assessment/Plan Principal Problem:   Acute deep vein thrombosis (DVT) of left lower extremity (HCC) Active Problems:   Acute pulmonary embolism (HCC)   Hypoxia   CAD in native artery   Chronic combined systolic and diastolic heart failure (HCC)   GERD without esophagitis   COPD with asthma (HCC)    Principal Problem:   Acute deep vein thrombosis (DVT) of left lower extremity (HCC) Plan: IV heparin drip.  As needed pain medication.  Evaluate for PE.  Active Problems:   Acute pulmonary embolism (HCC) Suspected.  Due to kidney function did not obtain CTA of the chest. Plan: Empiric IV heparin drip.  Ordered VQ scan    Hypoxia Plan: Oxygen by nasal cannula and increase as needed.  Acute kidney injury Plan: Trial of IV fluids.  Avoid nephrotoxins.  Caution with IV fluids due to history of CHF.  Monitor I's/O    CAD in native  artery Plan: Continue home medications as tolerated and outpatient follow-up.  As needed nitroglycerin if patient develops chest pain.    Chronic combined systolic and diastolic heart failure ([place]) Plan: Continue home medications.    GERD without esophagitis Plan: Continue home medications.    COPD with asthma ([place]) Plan: Continue home inhalers and as needed albuterol.    DVT prophylaxis: IV heparin drip Code Status:   Full Code Family Communication:      Disposition Plan:   Patient is from:  SNF  Anticipated DC to:  SNF  Anticipated DC date:  04/22/2021.  Anticipated DC barriers: None  Consults called:  None  Admission status:  Observation   Severity of Illness: The appropriate patient status for this patient is OBSERVATION. Observation status is judged to be reasonable and necessary in order to provide the required intensity of service to ensure the patient's safety. The patient's presenting symptoms, physical exam findings, and initial radiographic and laboratory data in the context of their medical condition is felt to place them at decreased risk for further clinical deterioration. Furthermore, it is anticipated that the patient will be medically stable for discharge from the hospital within 2 midnights of admission. The following factors support the patient status of observation.   " The patient's presenting symptoms include shortness of breath. " The physical exam findings include intermittent tachypnea, left lower extremity swelling. " The initial radiographic and laboratory data are notable for ultrasound showing nonocclusive left lower extremity DVT.    Tacey Ruiz MD [facility]  How to contact the [facility] Attending or Consulting provider [place] or covering provider during after hours [place], for this patient?   Check the care team in [facility] and look for a) attending/consulting TR[facility] provider listed and b) the [facility] team listed Log into [email or web address] and use [facility]'s universal password to access. If you do not have the password, please contact the hospital operator. Locate the [facility] provider you are looking for under [facility]  and page to a number that you can be directly reached. If you still have difficulty reaching the provider, please page the [facility] (Director on Call) for the Hospitalists listed on amion for assistance.  04/21/2021, 8:01 PM

## 2021-04-21 NOTE — ED Notes (Signed)
Provider Dena Billet K notified via secure chat of continued HTN. Awaiting orders.

## 2021-04-21 NOTE — ED Notes (Signed)
Pt advocacy at bedside talking with pt and her daughter.

## 2021-04-21 NOTE — ED Notes (Signed)
Admitting MD at bedside.

## 2021-04-21 NOTE — ED Notes (Signed)
Providers CT and [place] notified in person of elevated trop and BNP.

## 2021-04-21 NOTE — ED Notes (Signed)
Pt assisted up to bedside toilet and then back to bed.

## 2021-04-21 NOTE — Consult Note (Signed)
ANTICOAGULATION CONSULT NOTE - Initial Consult  Pharmacy Consult for heparin infusion Indication: pulmonary embolus  Allergies  Allergen Reactions   Penicillins     Has patient had a PCN reaction causing immediate rash, facial/tongue/throat swelling, SOB or lightheadedness with hypotension: Unknown Has patient had a PCN reaction causing severe rash involving mucus membranes or skin necrosis: Unknown Has patient had a PCN reaction that required hospitalization No Has patient had a PCN reaction occurring within the last 10 years: No If all of the above answers are "NO", then may proceed with Cephalosporin use.    Patient Measurements: Height: 5\' 4"  (162.6 cm) Weight: 49.9 kg (110 lb) IBW/kg (Calculated) : 54.7 Heparin Dosing Weight: 49.9 kg  Vital Signs: Temp: 97.6 F (36.4 C) (06/24 1403) Temp Source: Oral (06/24 1403) BP: 176/89 (06/24 1730) Pulse Rate: 100 (06/24 1740)  Labs: Recent Labs    04/21/21 1417  HGB 12.9  HCT 39.2  PLT 231  CREATININE 1.50*  TROPONINIHS 239*    Estimated Creatinine Clearance: 18.5 mL/min (A) (by C-G formula based on SCr of 1.5 mg/dL (H)).   Medical History: Past Medical History:  Diagnosis Date   COPD (chronic obstructive pulmonary disease) ([place])    Coronary artery disease 03/06/2013   90% prox LAD stenosis s/p DES   Difficult intubation    Dizziness    GERD (gastroesophageal reflux disease)    Hypotension    Ischemic cardiomyopathy 03/09/2013   s/p NSTEMI. EF 30-35%.   MI (myocardial infarction) ([place])    PAF (paroxysmal atrial fibrillation) ([place]) 03/06/2013   In setting of NSTEMI and reperfusion, no recurrence   SVT (supraventricular tachycardia) (HCC)    Self-limited on outpatient cardiac monitor    Medications:  No prior AC noted  Assessment: 87 y.o. female with history of A. Fib -- not on anticoagulation due to high fall risk-- presents with leg swelling/chest pain. Pharmacy has been consulted for initiation and  management of heparin infusion for pulmonary embolism.   Goal of Therapy:  Heparin level 0.3-0.7 units/ml Monitor platelets by anticoagulation protocol: Yes   Plan:  Give 3500 units bolus x 1 Start heparin infusion at 800 units/hr Check anti-Xa level in 8 hours and daily while on heparin Continue to monitor H&H and platelets  Dorothe Pea, PharmD, BCPS Clinical Pharmacist   04/21/2021,6:27 PM

## 2021-04-21 NOTE — ED Notes (Signed)
This RN will give rest of pt's daily meds once verified by pharm.

## 2021-04-21 NOTE — ED Triage Notes (Signed)
N via EMS from [place]. Dementia & Afib & HF & COPD history. Swelling in legs. CP. 167/72 BP; 1L O2 97%; DNR paperwork. Nitro given last night with 2L O2 at facility. Pt is baseline Neuro per EMS.

## 2021-04-21 NOTE — ED Notes (Signed)
Pt given dinner tray.

## 2021-04-22 ENCOUNTER — Observation Stay: Payer: Medicare Other

## 2021-04-22 ENCOUNTER — Encounter: Payer: Self-pay | Admitting: Internal Medicine

## 2021-04-22 ENCOUNTER — Observation Stay (HOSPITAL_COMMUNITY)
Admit: 2021-04-22 | Discharge: 2021-04-22 | Disposition: A | Discharge status: Home / Self Care | Payer: Medicare Other | Attending: Internal Medicine | Admitting: Internal Medicine

## 2021-04-22 ENCOUNTER — Telehealth: Payer: Self-pay | Admitting: Physician Assistant

## 2021-04-22 DIAGNOSIS — N1832 Chronic kidney disease, stage 3b: Secondary | ICD-10-CM | POA: Diagnosis present

## 2021-04-22 DIAGNOSIS — J9811 Atelectasis: Secondary | ICD-10-CM | POA: Diagnosis not present

## 2021-04-22 DIAGNOSIS — I444 Left anterior fascicular block: Secondary | ICD-10-CM | POA: Diagnosis present

## 2021-04-22 DIAGNOSIS — J449 Chronic obstructive pulmonary disease, unspecified: Secondary | ICD-10-CM | POA: Diagnosis present

## 2021-04-22 DIAGNOSIS — N179 Acute kidney failure, unspecified: Secondary | ICD-10-CM | POA: Diagnosis not present

## 2021-04-22 DIAGNOSIS — I2510 Atherosclerotic heart disease of native coronary artery without angina pectoris: Secondary | ICD-10-CM | POA: Diagnosis present

## 2021-04-22 DIAGNOSIS — F0390 Unspecified dementia without behavioral disturbance: Secondary | ICD-10-CM | POA: Diagnosis present

## 2021-04-22 DIAGNOSIS — Z20822 Contact with and (suspected) exposure to covid-19: Secondary | ICD-10-CM | POA: Diagnosis present

## 2021-04-22 DIAGNOSIS — Z880 Allergy status to penicillin: Secondary | ICD-10-CM | POA: Diagnosis not present

## 2021-04-22 DIAGNOSIS — I255 Ischemic cardiomyopathy: Secondary | ICD-10-CM | POA: Diagnosis present

## 2021-04-22 DIAGNOSIS — Z681 Body mass index (BMI) 19 or less, adult: Secondary | ICD-10-CM | POA: Diagnosis not present

## 2021-04-22 DIAGNOSIS — E43 Unspecified severe protein-calorie malnutrition: Secondary | ICD-10-CM | POA: Diagnosis present

## 2021-04-22 DIAGNOSIS — I252 Old myocardial infarction: Secondary | ICD-10-CM | POA: Diagnosis not present

## 2021-04-22 DIAGNOSIS — I82492 Acute embolism and thrombosis of other specified deep vein of left lower extremity: Secondary | ICD-10-CM | POA: Diagnosis not present

## 2021-04-22 DIAGNOSIS — I2609 Other pulmonary embolism with acute cor pulmonale: Secondary | ICD-10-CM

## 2021-04-22 DIAGNOSIS — K219 Gastro-esophageal reflux disease without esophagitis: Secondary | ICD-10-CM | POA: Diagnosis present

## 2021-04-22 DIAGNOSIS — Z66 Do not resuscitate: Secondary | ICD-10-CM | POA: Diagnosis present

## 2021-04-22 DIAGNOSIS — I5043 Acute on chronic combined systolic (congestive) and diastolic (congestive) heart failure: Secondary | ICD-10-CM | POA: Diagnosis not present

## 2021-04-22 DIAGNOSIS — U071 COVID-19: Secondary | ICD-10-CM | POA: Diagnosis not present

## 2021-04-22 DIAGNOSIS — I480 Paroxysmal atrial fibrillation: Secondary | ICD-10-CM | POA: Diagnosis present

## 2021-04-22 DIAGNOSIS — I82412 Acute embolism and thrombosis of left femoral vein: Secondary | ICD-10-CM | POA: Diagnosis present

## 2021-04-22 DIAGNOSIS — J9601 Acute respiratory failure with hypoxia: Secondary | ICD-10-CM | POA: Diagnosis not present

## 2021-04-22 DIAGNOSIS — I471 Supraventricular tachycardia: Secondary | ICD-10-CM | POA: Diagnosis present

## 2021-04-22 DIAGNOSIS — I5042 Chronic combined systolic (congestive) and diastolic (congestive) heart failure: Secondary | ICD-10-CM | POA: Diagnosis not present

## 2021-04-22 DIAGNOSIS — I248 Other forms of acute ischemic heart disease: Secondary | ICD-10-CM | POA: Diagnosis present

## 2021-04-22 DIAGNOSIS — Z955 Presence of coronary angioplasty implant and graft: Secondary | ICD-10-CM | POA: Diagnosis not present

## 2021-04-22 DIAGNOSIS — I130 Hypertensive heart and chronic kidney disease with heart failure and stage 1 through stage 4 chronic kidney disease, or unspecified chronic kidney disease: Secondary | ICD-10-CM | POA: Diagnosis present

## 2021-04-22 LAB — ECHOCARDIOGRAM COMPLETE
AR max vel: 1.59 cm2
AV Area VTI: 1.65 cm2
AV Area mean vel: 1.54 cm2
AV Mean grad: 5 mmHg
AV Peak grad: 9.5 mmHg
Ao pk vel: 1.54 m/s
Area-P 1/2: 4.17 cm2
Calc EF: 35.9 %
Height: 64 in
P 1/2 time: 329 msec
S' Lateral: 3.8 cm
Single Plane A2C EF: 36.6 %
Single Plane A4C EF: 31.8 %
Weight: 1329.81 oz

## 2021-04-22 LAB — CBC
HCT: 36 % (ref 36.0–46.0)
Hemoglobin: 11.7 g/dL — ABNORMAL LOW (ref 12.0–15.0)
MCH: 31.5 pg (ref 26.0–34.0)
MCHC: 32.5 g/dL (ref 30.0–36.0)
MCV: 97 fL (ref 80.0–100.0)
Platelets: 207 10*3/uL (ref 150–400)
RBC: 3.71 MIL/uL — ABNORMAL LOW (ref 3.87–5.11)
RDW: 13.3 % (ref 11.5–15.5)
WBC: 19.1 10*3/uL — ABNORMAL HIGH (ref 4.0–10.5)
nRBC: 0 % (ref 0.0–0.2)

## 2021-04-22 LAB — BASIC METABOLIC PANEL
Anion gap: 7 (ref 5–15)
BUN: 42 mg/dL — ABNORMAL HIGH (ref 8–23)
CO2: 25 mmol/L (ref 22–32)
Calcium: 8.6 mg/dL — ABNORMAL LOW (ref 8.9–10.3)
Chloride: 103 mmol/L (ref 98–111)
Creatinine, Ser: 1.45 mg/dL — ABNORMAL HIGH (ref 0.44–1.00)
GFR, Estimated: 34 mL/min — ABNORMAL LOW (ref >60)
Glucose, Bld: 148 mg/dL — ABNORMAL HIGH (ref 70–99)
Potassium: 4.9 mmol/L (ref 3.5–5.1)
Sodium: 135 mmol/L (ref 135–145)

## 2021-04-22 LAB — TROPONIN I (HIGH SENSITIVITY)
Troponin I (High Sensitivity): 440 ng/L (ref <18)
Troponin I (High Sensitivity): 514 ng/L (ref <18)

## 2021-04-22 LAB — LIPID PANEL
Cholesterol: 151 mg/dL (ref 0–200)
HDL: 71 mg/dL (ref >40)
LDL Cholesterol: 74 mg/dL (ref 0–99)
Total CHOL/HDL Ratio: 2.1 RATIO
Triglycerides: 29 mg/dL (ref <150)
VLDL: 6 mg/dL (ref 0–40)

## 2021-04-22 LAB — HEPARIN LEVEL (UNFRACTIONATED)
Heparin Unfractionated: 0.96 IU/mL — ABNORMAL HIGH (ref 0.30–0.70)
Heparin Unfractionated: 0.41 IU/mL (ref 0.30–0.70)
Heparin Unfractionated: 0.41 IU/mL (ref 0.30–0.70)

## 2021-04-22 LAB — APTT: aPTT: >200 seconds (ref 24–36)

## 2021-04-22 LAB — MRSA NEXT GEN BY PCR, NASAL: MRSA by PCR Next Gen: NOT DETECTED

## 2021-04-22 MED ORDER — FUROSEMIDE 10 MG/ML IJ SOLN
40.0000 mg | Freq: Once | INTRAMUSCULAR | Status: AC
  Administered 2021-04-22: 40 mg via INTRAVENOUS

## 2021-04-22 MED ORDER — TECHNETIUM TO 99M ALBUMIN AGGREGATED
4.0000 | Freq: Once | INTRAVENOUS | Status: AC | PRN
  Administered 2021-04-22: 3.89 via INTRAVENOUS

## 2021-04-22 MED ORDER — ALBUTEROL SULFATE (2.5 MG/3ML) 0.083% IN NEBU
2.5000 mg | INHALATION_SOLUTION | RESPIRATORY_TRACT | Status: DC | PRN
  Administered 2021-04-22: 2.5 mg via RESPIRATORY_TRACT
  Filled 2021-04-22: qty 3

## 2021-04-22 MED ORDER — FUROSEMIDE 10 MG/ML IJ SOLN
40.0000 mg | Freq: Two times a day (BID) | INTRAMUSCULAR | Status: DC
  Filled 2021-04-22: qty 4

## 2021-04-22 MED ORDER — FUROSEMIDE 10 MG/ML IJ SOLN: 20.0000 mg | Freq: Two times a day (BID) | INTRAMUSCULAR | Status: DC

## 2021-04-22 NOTE — Telephone Encounter (Signed)
Received a call from the answering service regarding this patient.  Daughter just wanted to notify the team that the patient was admitted and a cardiology consult has been placed.  Informed the daughter that we are rounding on the patient today and there is a cardiology team in place for rounding over the weekend.

## 2021-04-22 NOTE — Consult Note (Signed)
[place] for heparin infusion Indication: pulmonary embolus  Allergies  Allergen Reactions   Penicillins     Has patient had a PCN reaction causing immediate rash, facial/tongue/throat swelling, SOB or lightheadedness with hypotension: Unknown Has patient had a PCN reaction causing severe rash involving mucus membranes or skin necrosis: Unknown Has patient had a PCN reaction that required hospitalization No Has patient had a PCN reaction occurring within the last 10 years: No If all of the above answers are "NO", then may proceed with Cephalosporin use.    Patient Measurements: Height: 5\' 4"  (162.6 cm) Weight: 37.7 kg (83 lb 1.8 oz) IBW/kg (Calculated) : 54.7 Heparin Dosing Weight: 49.9 kg  Vital Signs: Temp: 97.9 F (36.6 C) (06/25 1218) Temp Source: Oral (06/25 1218) BP: 160/58 (06/25 1218) Pulse Rate: 79 (06/25 1218)  Labs: Recent Labs    04/21/21 1417 04/21/21 1855 04/21/21 2100 04/21/21 2343 04/22/21 0042 04/22/21 0331 04/22/21 1251  HGB 12.9  --   --   --   --  11.7*  --   HCT 39.2  --   --   --   --  36.0  --   PLT 231  --   --   --   --  207  --   APTT  --  26  --   --   --  >200*  --   LABPROT  --  14.0  --   --   --   --   --   INR  --  1.1  --   --   --   --   --   HEPARINUNFRC  --   --   --   --   --  0.96* 0.41  CREATININE 1.50*  --   --   --   --  1.45*  --   TROPONINIHS 239*  --  366* 440* 514*  --   --      Estimated Creatinine Clearance: 14.4 mL/min (A) (by C-G formula based on SCr of 1.45 mg/dL (H)).   Medical History: Past Medical History:  Diagnosis Date   COPD (chronic obstructive pulmonary disease) ([place])    Coronary artery disease 03/06/2013   90% prox LAD stenosis s/p DES   Difficult intubation    Dizziness    GERD (gastroesophageal reflux disease)    Hypotension    Ischemic cardiomyopathy 03/09/2013   s/p NSTEMI. EF 30-35%.   MI (myocardial infarction) ([place])    PAF (paroxysmal atrial  fibrillation) ([place] [facility]) 03/06/2013   In setting of NSTEMI and reperfusion, no recurrence   SVT (supraventricular tachycardia) (HCC)    Self-limited on outpatient cardiac monitor    Medications:  No prior AC noted  Assessment: 87 y.o. female with history of A. Fib -- not on anticoagulation due to high fall risk-- presents with leg swelling/chest pain. Pharmacy has been consulted for initiation and management of heparin infusion for pulmonary embolism.   Goal of Therapy:  Heparin level 0.3-0.7 units/ml Monitor platelets by anticoagulation protocol: Yes   Plan:  Heparin level therapeutic Continue heparin drip at 700 units/hr Recheck HL at 2100 to confirm CBC daily while on heparin drip  Tawnya Crook, PharmD Clinical Pharmacist   04/22/2021,1:43 PM

## 2021-04-22 NOTE — Consult Note (Signed)
ANTICOAGULATION CONSULT NOTE - Initial Consult  Pharmacy Consult for heparin infusion Indication: pulmonary embolus  Allergies  Allergen Reactions   Penicillins     Has patient had a PCN reaction causing immediate rash, facial/tongue/throat swelling, SOB or lightheadedness with hypotension: Unknown Has patient had a PCN reaction causing severe rash involving mucus membranes or skin necrosis: Unknown Has patient had a PCN reaction that required hospitalization No Has patient had a PCN reaction occurring within the last 10 years: No If all of the above answers are "NO", then may proceed with Cephalosporin use.    Patient Measurements: Height: 5\' 4"  (162.6 cm) Weight: 49.9 kg (110 lb) IBW/kg (Calculated) : 54.7 Heparin Dosing Weight: 49.9 kg  Vital Signs: BP: 152/63 (06/25 0309) Pulse Rate: 79 (06/25 0309)  Labs: Recent Labs    04/21/21 1417 04/21/21 1855 04/21/21 2100 04/21/21 2343 04/22/21 0042 04/22/21 0331  HGB 12.9  --   --   --   --  11.7*  HCT 39.2  --   --   --   --  36.0  PLT 231  --   --   --   --  207  APTT  --  26  --   --   --   --   LABPROT  --  14.0  --   --   --   --   INR  --  1.1  --   --   --   --   HEPARINUNFRC  --   --   --   --   --  0.96*  CREATININE 1.50*  --   --   --   --  1.45*  TROPONINIHS 239*  --  366* 440* 514*  --      Estimated Creatinine Clearance: 19.1 mL/min (A) (by C-G formula based on SCr of 1.45 mg/dL (H)).   Medical History: Past Medical History:  Diagnosis Date   COPD (chronic obstructive pulmonary disease) ([place])    Coronary artery disease 03/06/2013   90% prox LAD stenosis s/p DES   Difficult intubation    Dizziness    GERD (gastroesophageal reflux disease)    Hypotension    Ischemic cardiomyopathy 03/09/2013   s/p NSTEMI. EF 30-35%.   MI (myocardial infarction) ([place])    PAF (paroxysmal atrial fibrillation) ([place]) 03/06/2013   In setting of NSTEMI and reperfusion, no recurrence   SVT (supraventricular tachycardia)  (HCC)    Self-limited on outpatient cardiac monitor    Medications:  No prior AC noted  Assessment: 87 y.o. female with history of A. Fib -- not on anticoagulation due to high fall risk-- presents with leg swelling/chest pain. Pharmacy has been consulted for initiation and management of heparin infusion for pulmonary embolism.   Goal of Therapy:  Heparin level 0.3-0.7 units/ml Monitor platelets by anticoagulation protocol: Yes   Plan:  6/25:  HL @ 0331 = 0.96 Will decrease heparin drip to 700 units/hr and recheck HL  8 hrs after rate change.   Orene Desanctis, PharmD Clinical Pharmacist   04/22/2021,4:31 AM

## 2021-04-22 NOTE — Consult Note (Signed)
Cardiology Consultation:   Patient ID: Linda Hart MRN: [id number]; DOB: Dec 24, 1924  Admit date: 04/21/2021 Date of Consult: 04/22/2021  PCP:  Kirk Ruths, MD   [place] [facility] Cardiologist:  Dr. Rockey Situ   Patient Profile:   Linda Hart is a 87 y.o. female with a hx of CAD/PCI, dementia, A. fib, COPD who is being seen 04/22/2021 for the evaluation of edema at the request of Dr Kurtis Bushman.  History of Present Illness:   Linda Hart is a 87 year old female with history of CAD/DES to LAD in 2014, CKD, paroxysmal A. fib not on anticoagulation due to falls, chronic CHF, last EF 40 to 45%, COPD who presents due to lower extremity edema and shortness of breath..  History difficult to obtain due to condition of patient.  Patient is currently getting echocardiogram.  Apparently she was brought in due to swelling in her legs.  She denies chest pain or shortness of breath.  She is aware of where she is.  Work-up in the ED with lower extremity ultrasound did reveal right common femoral vein nonocclusive DVT.  Started on heparin drip.  EKG showed sinus rhythm with PACs, creatinine was 1.45, troponins were minimally elevated at 239, 126 6, 440.  Underwent a VQ scan with low probability for PE.   Past Medical History:  Diagnosis Date   COPD (chronic obstructive pulmonary disease) ([place])    Coronary artery disease 03/06/2013   90% prox LAD stenosis s/p DES   Difficult intubation    Dizziness    GERD (gastroesophageal reflux disease)    Hypotension    Ischemic cardiomyopathy 03/09/2013   s/p NSTEMI. EF 30-35%.   MI (myocardial infarction) ([place])    PAF (paroxysmal atrial fibrillation) ([place]) 03/06/2013   In setting of NSTEMI and reperfusion, no recurrence   SVT (supraventricular tachycardia) ([place])    Self-limited on outpatient cardiac monitor    Past Surgical History:  Procedure Laterality Date   CESAREAN SECTION     CORONARY ANGIOPLASTY WITH STENT PLACEMENT  02/2013    DES-mid LAD, mild LCx, RCA stenoses   INTRAMEDULLARY (IM) NAIL INTERTROCHANTERIC Right 08/19/2017   Procedure: INTRAMEDULLARY (IM) NAIL INTERTROCHANTRIC;  Surgeon: Hessie Knows, MD;  Location: [facility] ORS;  Service: Orthopedics;  Laterality: Right;     Home Medications:  Prior to Admission medications   Medication Sig Start Date End Date Taking? Authorizing Provider  Ascorbic Acid (VITAMIN C) 1000 MG tablet Take 1,000 mg by mouth daily.   Yes [provider]  aspirin EC 81 MG tablet Take 1 tablet (81 mg total) by mouth daily.   Yes Gollan, Kathlene November, MD  carvedilol (COREG) 3.125 MG tablet TAKE ONE TABLET TWICE A DAY WITH MEALS. Patient taking differently: Take 3.125 mg by mouth 2 (two) times daily. 02/22/17  Yes Leone Haven, MD  carvedilol (COREG) 3.125 MG tablet Take 3.125 mg by mouth daily as needed (tachycardia, palpitations or HR >100).   Yes [provider]  Cholecalciferol 4000 units CAPS Take 4,000 Units by mouth every other day.   Yes [provider]  feeding supplement (ENSURE ENLIVE / ENSURE PLUS) LIQD Take 1 Bottle by mouth every other day.   Yes [provider]  levothyroxine (SYNTHROID) 25 MCG tablet Take 25 mcg by mouth daily.   Yes [provider]  nitroGLYCERIN (NITROSTAT) 0.4 MG SL tablet Place 0.4 mg under the tongue every 5 (five) minutes as needed for chest pain.   Yes [provider]  omeprazole (PRILOSEC) 20 MG  capsule Take 20 mg by mouth daily.   Yes [provider]  spironolactone (ALDACTONE) 25 MG tablet Take 12.5 mg by mouth daily.   Yes [provider]  vitamin B-12 (CYANOCOBALAMIN) 1000 MCG tablet Take 1,000 mcg by mouth daily.  10/30/18  Yes [provider]    Inpatient Medications: Scheduled Meds:  vitamin C  1,000 mg Oral Daily   aspirin EC  81 mg Oral Daily   atorvastatin  40 mg Oral Daily   carvedilol  3.125 mg Oral BID   feeding supplement  1 Bottle Oral QODAY    levothyroxine  25 mcg Oral Daily   pantoprazole (PROTONIX) IV  40 mg Intravenous Q12H   spironolactone  12.5 mg Oral Daily   vitamin B-12  1,000 mcg Oral Daily   Continuous Infusions:  heparin 700 Units/hr (04/22/21 0434)   lactated ringers 100 mL/hr at 04/21/21 2103   PRN Meds: acetaminophen **OR** acetaminophen, albuterol, bisacodyl, HYDROcodone-acetaminophen, magnesium citrate, metoprolol tartrate, morphine injection, nitroGLYCERIN, ondansetron **OR** ondansetron (ZOFRAN) IV, senna-docusate  Allergies:    Allergies  Allergen Reactions   Penicillins     Has patient had a PCN reaction causing immediate rash, facial/tongue/throat swelling, SOB or lightheadedness with hypotension: Unknown Has patient had a PCN reaction causing severe rash involving mucus membranes or skin necrosis: Unknown Has patient had a PCN reaction that required hospitalization No Has patient had a PCN reaction occurring within the last 10 years: No If all of the above answers are "NO", then may proceed with Cephalosporin use.    Social History:   Social History   Socioeconomic History   Marital status: Widowed    Spouse name: Not on file   Number of children: 3   Years of education: Not on file   Highest education level: Not on file  Occupational History   Not on file  Tobacco Use   Smoking status: Never   Smokeless tobacco: Never  Vaping Use   Vaping Use: Never used  Substance and Sexual Activity   Alcohol use: Not Currently    Comment: occasional   Drug use: No   Sexual activity: Never  Other Topics Concern   Not on file  Social History Narrative   Resident at [employer]. Does not regularly exercise.    DNR   Denies alcohol use   Never smoker   3 children   Social Determinants of Radio broadcast assistant Strain: Not on file  Food Insecurity: Not on file  Transportation Needs: Not on file  Physical Activity: Not on file  Stress: Not on file  Social Connections: Not on file   Intimate Partner Violence: Not on file    Family History:    Family History  Problem Relation Age of Onset   Heart failure Mother    Heart failure Father    Heart attack Father      ROS:  Please see the history of present illness.   All other ROS reviewed and negative.     Physical Exam/Data:   Vitals:   04/22/21 0309 04/22/21 0500 04/22/21 0757 04/22/21 1218  BP: (!) 152/63  (!) 161/75 (!) 160/58  Pulse: 79  91 79  Resp: 20  19 19   Temp:   97.8 F (36.6 C) 97.9 F (36.6 C)  TempSrc:   Oral Oral  SpO2: 97%  97% 100%  Weight:  37.7 kg    Height:        Intake/Output Summary (Last 24 hours) at 04/22/2021  [place] filed at 04/22/2021 0277 Gross per 24 hour  Intake --  Output 200 ml  Net -200 ml   Last 3 Weights 04/22/2021 04/21/2021 03/08/2021  Weight (lbs) 83 lb 1.8 oz 110 lb 87 lb  Weight (kg) 37.7 kg 49.896 kg 39.463 kg     Body mass index is 14.27 kg/m.  General: Elderly female, appears frail, no acute distress HEENT: normal Lymph: no adenopathy Neck: no JVD Endocrine:  No thryomegaly Vascular: No carotid bruits; FA pulses 2+ bilaterally without bruits  Cardiac: Distant heart sounds, regular Lungs: Poor inspiratory effort, diminished breath sounds at bases Abd: soft, nontender, no hepatomegaly  Ext: Trace edema Musculoskeletal:  No deformities,  Skin: warm and dry  Neuro:  CNs 2-12 intact, no focal abnormalities noted Psych:  Normal affect   EKG:  The EKG was personally reviewed and demonstrates: Sinus rhythm, PACs Telemetry:  Telemetry was personally reviewed and demonstrates: Currently off telemetry  Relevant CV Studies: Echo 2016 Left ventricle: The cavity size was normal. Systolic function was    mildly to moderately reduced. The estimated ejection fraction was    in the range of 40% to 45%. Mild to moderate hypokinesis of the    anteroseptal myocardium. Doppler parameters are consistent with    abnormal left ventricular relaxation (grade 1  diastolic    dysfunction).  - Aortic valve: There was mild regurgitation.  - Mitral valve: Calcified annulus. There was mild to moderate    regurgitation.  - Left atrium: The atrium was mildly dilated.  - Right ventricle: Systolic function was normal.  - Pulmonary arteries: Systolic pressure was within the normal    range.   Laboratory Data:  High Sensitivity Troponin:   Recent Labs  Lab 04/21/21 1417 04/21/21 2100 04/21/21 2343 04/22/21 0042  TROPONINIHS 239* 366* 440* 514*     Chemistry Recent Labs  Lab 04/21/21 1417 04/22/21 0331  NA 133* 135  K 5.1 4.9  CL 101 103  CO2 24 25  GLUCOSE 211* 148*  BUN 40* 42*  CREATININE 1.50* 1.45*  CALCIUM 9.1 8.6*  GFRNONAA 32* 34*  ANIONGAP 8 7    Recent Labs  Lab 04/21/21 1417  PROT 6.6  ALBUMIN 3.4*  AST 28  ALT 16  ALKPHOS 83  BILITOT 1.6*   Hematology Recent Labs  Lab 04/21/21 1417 04/22/21 0331  WBC 18.6* 19.1*  RBC 4.05 3.71*  HGB 12.9 11.7*  HCT 39.2 36.0  MCV 96.8 97.0  MCH 31.9 31.5  MCHC 32.9 32.5  RDW 13.2 13.3  PLT 231 207   BNP Recent Labs  Lab 04/21/21 1417  BNP 2,796.4*    DDimer No results for input(s): DDIMER in the last 168 hours.   Radiology/Studies:  DG Chest 2 View  Result Date: 04/21/2021 CLINICAL DATA:  Difficulty breathing. Patient unable to specify duration of current complaint. Hx of COPD, CAD, HTN, MI, PAF, SVT. EXAM: CHEST - 2 VIEW COMPARISON:  12/20/2018 and older exams. FINDINGS: Cardiac silhouette is normal in size. No mediastinal or hilar masses or evidence of adenopathy. Lungs are hyperexpanded. Small bilateral pleural effusions. Additional lung base opacity also noted consistent with atelectasis. Remainder of the lungs is clear. No pneumothorax. Marked compression deformity of what is either T12 or L1, new since the most recent prior study. No other fractures. IMPRESSION: 1. Hyperexpanded lungs with small effusions and mild basilar atelectasis, but no convincing pneumonia  or pulmonary edema. 2. Marked compression deformity of a vertebra at the thoracolumbar  junction, most likely T12, new since the prior chest radiograph. Electronically Signed   By: Lajean Manes M.D.   On: 04/21/2021 15:21   NM Pulmonary Perfusion  Result Date: 04/22/2021 CLINICAL DATA:  PE suspected EXAM: NUCLEAR MEDICINE PERFUSION LUNG SCAN TECHNIQUE: Perfusion images were obtained in multiple projections after intravenous injection of radiopharmaceutical. Ventilation scans intentionally deferred if perfusion scan and chest x-ray adequate for interpretation during COVID 19 epidemic. RADIOPHARMACEUTICALS:  3.89 mCi Tc-45m MAA IV COMPARISON:  Chest radiograph, 04/21/2021 FINDINGS: Normal, homogeneous perfusion of the lungs. No suspicious filling defect. IMPRESSION: Very low probability examination for pulmonary embolism by modified perfusion only PIOPED criteria (PE absent). Electronically Signed   By: Eddie Candle M.D.   On: 04/22/2021 11:51   US Venous Img Lower Bilateral  Addendum Date: 04/22/2021   ADDENDUM REPORT: 04/21/2021 18:56 ADDENDUM: Correction to impression. Critical test results called to Dr. Kerman Passey (not Marjean Donna). Electronically Signed   By: Donavan Foil M.D.   On: 04/21/2021 18:56   Result Date: 04/21/2021 CLINICAL DATA:  Swelling EXAM: BILATERAL LOWER EXTREMITY VENOUS DOPPLER ULTRASOUND TECHNIQUE: Gray-scale sonography with graded compression, as well as color Doppler and duplex ultrasound were performed to evaluate the lower extremity deep venous systems from the level of the common femoral vein and including the common femoral, femoral, profunda femoral, popliteal and calf veins including the posterior tibial, peroneal and gastrocnemius veins when visible. The superficial great saphenous vein was also interrogated. Spectral Doppler was utilized to evaluate flow at rest and with distal augmentation maneuvers in the common femoral, femoral and popliteal veins. COMPARISON:  None.  FINDINGS: RIGHT LOWER EXTREMITY Common Femoral Vein: Incompletely compressible with suspected small amount of nonocclusive peripheral thrombus. Saphenofemoral Junction: Incompletely compressible with suspected small amount of nonocclusive peripheral thrombus. Profunda Femoral Vein: No evidence of thrombus. Normal compressibility and flow on color Doppler imaging. Femoral Vein: Incompletely compressible distal femoral vein with possible small amount of nonocclusive thrombus. Popliteal Vein: No evidence of thrombus. Normal compressibility, respiratory phasicity and response to augmentation. Calf Veins: No evidence of thrombus. Normal compressibility and flow on color Doppler imaging. Superficial Great Saphenous Vein: No evidence of thrombus. Normal compressibility. Venous Reflux:  None. Other Findings:  None. LEFT LOWER EXTREMITY Common Femoral Vein: No evidence of thrombus. Normal compressibility, respiratory phasicity and response to augmentation. Saphenofemoral Junction: No evidence of thrombus. Normal compressibility and flow on color Doppler imaging. Profunda Femoral Vein: No evidence of thrombus. Normal compressibility and flow on color Doppler imaging. Femoral Vein: No evidence of thrombus. Normal compressibility, respiratory phasicity and response to augmentation. Popliteal Vein: No evidence of thrombus. Normal compressibility, respiratory phasicity and response to augmentation. Calf Veins: No evidence of thrombus. Normal compressibility and flow on color Doppler imaging. Superficial Great Saphenous Vein: No evidence of thrombus. Normal compressibility. Venous Reflux:  None. Other Findings:  None. IMPRESSION: 1. Suspect small amount of nonocclusive DVT within the right common femoral and distal femoral vein. 2. Negative for acute left lower extremity DVT. Critical Value/emergent results were called by telephone at the time of interpretation on 04/21/2021 at 6:05 pm to provider [facility] , who verbally  acknowledged these results. Electronically Signed: By: Donavan Foil M.D. On: 04/21/2021 18:07     Assessment and Plan:   1.  Edema, R. lower extremity DVT -On heparin drip -VQ scan with no evidence of PE -Heparin drip, anticoagulation as per medicine service.  2.  Elevated troponins -Likely reflect demand supply mismatch with underlying CAD -Invasive work-up not planned -  History of mild to moderately reduced ejection fraction -Continue PTA medications  3.  History of paroxysmal A. Fib -Not on anticoagulation due to falls -Currently in sinus rhythm -Continue carvedilol.  4.  Cardiomyopathy, EF 40 to 45% -Appears euvolemic -Continue Coreg, spironolactone.  Avoid nephrotoxins due to underlying CKD.   Patient has chronic stable cardiac conditions.  No cardiac intervention or additional cardiac testing is being planned this admission.  Not on anticoagulation long-term for atrial fibrillation due to history of frequent falls.  Continue chronic cardiac medications.  Please let us know if any additional input is needed.  Cardiology will sign off.  Total encounter time 110 minutes  Greater than 50% was spent in counseling and coordination of care with the patient     Signed, Kate Sable, MD  04/22/2021 1:46 PM

## 2021-04-22 NOTE — Progress Notes (Addendum)
PROGRESS NOTE    [name]  NWG:[id number] DOB: [birth year] DOA: 04/21/2021 PCP: Kirk Ruths, MD    Brief Narrative:  Linda Hart is a 87 y.o. female with medical history significant for coronary artery disease with stent placement, chronic systolic and diastolic congestive heart failure with EF 40-45% in 2016, COPD, GERD, who presents to the emergency department on 04/21/2021 with shortness of breath.  Patient reports she is tired and she is not giving very much history at all, so history is obtained from the emergency department physician and the medical record.  Per report patient had shortness of breath and chest pain that began on 04/20/2021; at the time of admission patient denies that she had any chest pain, but does admit to shortness of breath.  On 04/20/2021, the patient was given nitroglycerin for her chest pain.  On 04/21/2021, the patient was noted to be short of breath and she was also noted to have swelling in her left lower extremity.  Symptoms were alleviated by nothing and exacerbated by nothing.  Shortness of breath is constant.  Associated symptoms: Per report patient had chest pain on the day before admission.  She has left lower leg swelling. Ultrasound of the left lower extremity revealed a nonocclusive left leg DVT.  CTA not done due to underlying kidney function.  VQ scan was done which was negative.  Currently on heparin drip  6/25 daugther at bedside reports mom more sob this am.    Consultants:  cardiology  Procedures:   Antimicrobials:      Subjective: Pt reports being sob. On 2L [place]. No cp this am.   Objective: Vitals:   04/21/21 2345 04/22/21 0309 04/22/21 0500 04/22/21 0757  BP: 134/61 (!) 152/63  (!) 161/75  Pulse: 93 79  91  Resp: 20 20  19   Temp:    97.8 F (36.6 C)  TempSrc:    Oral  SpO2: 100% 97%  97%  Weight:   37.7 kg   Height:       No intake or output data in the 24 hours ending 04/22/21 0827 Filed Weights   04/21/21 1404  04/22/21 0500  Weight: 49.9 kg 37.7 kg    Examination:  General exam: Appears calm, tired, mildly wheezing.  Respiratory system: decrease bs , scattered rales at bases, end expiratory wheezing.  Cardiovascular system: S1 & S2 heard, RRR. +JVD Gastrointestinal system: Abdomen is nondistended, soft and nontender. Normal bowel sounds heard. Central nervous system: Alert and oriented to place and person, not date. Grossly intact Extremities: +LLE swelling. Skin: warm, dry Psychiatry:  Mood & affect appropriate in current setting.     Data Reviewed: I have personally reviewed following labs and imaging studies  CBC: Recent Labs  Lab 04/21/21 1417 04/22/21 0331  WBC 18.6* 19.1*  HGB 12.9 11.7*  HCT 39.2 36.0  MCV 96.8 97.0  PLT 231 578   Basic Metabolic Panel: Recent Labs  Lab 04/21/21 1417 04/22/21 0331  NA 133* 135  K 5.1 4.9  CL 101 103  CO2 24 25  GLUCOSE 211* 148*  BUN 40* 42*  CREATININE 1.50* 1.45*  CALCIUM 9.1 8.6*   GFR: Estimated Creatinine Clearance: 14.4 mL/min (A) (by C-G formula based on SCr of 1.45 mg/dL (H)). Liver Function Tests: Recent Labs  Lab 04/21/21 1417  AST 28  ALT 16  ALKPHOS 83  BILITOT 1.6*  PROT 6.6  ALBUMIN 3.4*   No results for input(s): LIPASE, AMYLASE in the last 168 hours.  No results for input(s): AMMONIA in the last 168 hours. Coagulation Profile: Recent Labs  Lab 04/21/21 1855  INR 1.1   Cardiac Enzymes: No results for input(s): CKTOTAL, CKMB, CKMBINDEX, TROPONINI in the last 168 hours. BNP (last 3 results) No results for input(s): PROBNP in the last 8760 hours. HbA1C: No results for input(s): HGBA1C in the last 72 hours. CBG: No results for input(s): GLUCAP in the last 168 hours. Lipid Profile: Recent Labs    04/22/21 0331  CHOL 151  HDL 71  LDLCALC 74  TRIG 29  CHOLHDL 2.1   Thyroid Function Tests: No results for input(s): TSH, T4TOTAL, FREET4, T3FREE, THYROIDAB in the last 72 hours. Anemia Panel: No  results for input(s): VITAMINB12, FOLATE, FERRITIN, TIBC, IRON, RETICCTPCT in the last 72 hours. Sepsis Labs: No results for input(s): PROCALCITON, LATICACIDVEN in the last 168 hours.  Recent Results (from the past 240 hour(s))  Resp Panel by RT-PCR (Flu A&B, Covid) Nasopharyngeal Swab     Status: None   Collection Time: 04/21/21  3:03 PM   Specimen: Nasopharyngeal Swab; Nasopharyngeal(NP) swabs in vial transport medium  Result Value Ref Range Status   SARS Coronavirus 2 by RT PCR NEGATIVE NEGATIVE Final    Comment: (NOTE) SARS-CoV-2 target nucleic acids are NOT DETECTED.  The SARS-CoV-2 RNA is generally detectable in upper respiratory specimens during the acute phase of infection. The lowest concentration of SARS-CoV-2 viral copies this assay can detect is 138 copies/mL. A negative result does not preclude SARS-Cov-2 infection and should not be used as the sole basis for treatment or other patient management decisions. A negative result may occur with  improper specimen collection/handling, submission of specimen other than nasopharyngeal swab, presence of viral mutation(s) within the areas targeted by this assay, and inadequate number of viral copies(<138 copies/mL). A negative result must be combined with clinical observations, patient history, and epidemiological information. The expected result is Negative.  Fact Sheet for Patients:  EntrepreneurPulse.com.au  Fact Sheet for Healthcare Providers:  IncredibleEmployment.be  This test is no t yet approved or cleared by the [place] FDA and  has been authorized for detection and/or diagnosis of SARS-CoV-2 by FDA under an Emergency Use Authorization (EUA). This EUA will remain  in effect (meaning this test can be used) for the duration of the COVID-19 declaration under Section 564(b)(1) of the Act, 21 U.S.C.section 360bbb-3(b)(1), unless the authorization is terminated  or revoked sooner.        Influenza A by PCR NEGATIVE NEGATIVE Final   Influenza B by PCR NEGATIVE NEGATIVE Final    Comment: (NOTE) The Xpert Xpress SARS-CoV-2/FLU/RSV plus assay is intended as an aid in the diagnosis of influenza from Nasopharyngeal swab specimens and should not be used as a sole basis for treatment. Nasal washings and aspirates are unacceptable for Xpert Xpress SARS-CoV-2/FLU/RSV testing.  Fact Sheet for Patients: EntrepreneurPulse.com.au  Fact Sheet for Healthcare Providers: IncredibleEmployment.be  This test is not yet approved or cleared by the [place] FDA and has been authorized for detection and/or diagnosis of SARS-CoV-2 by FDA under an Emergency Use Authorization (EUA). This EUA will remain in effect (meaning this test can be used) for the duration of the COVID-19 declaration under Section 564(b)(1) of the Act, 21 U.S.C. section 360bbb-3(b)(1), unless the authorization is terminated or revoked.  Performed at [facility], [address], [place], [place] [address]          Radiology Studies: DG Chest 2 View  Result Date: 04/21/2021 CLINICAL  DATA:  Difficulty breathing. Patient unable to specify duration of current complaint. Hx of COPD, CAD, HTN, MI, PAF, SVT. EXAM: CHEST - 2 VIEW COMPARISON:  12/20/2018 and older exams. FINDINGS: Cardiac silhouette is normal in size. No mediastinal or hilar masses or evidence of adenopathy. Lungs are hyperexpanded. Small bilateral pleural effusions. Additional lung base opacity also noted consistent with atelectasis. Remainder of the lungs is clear. No pneumothorax. Marked compression deformity of what is either T12 or L1, new since the most recent prior study. No other fractures. IMPRESSION: 1. Hyperexpanded lungs with small effusions and mild basilar atelectasis, but no convincing pneumonia or pulmonary edema. 2. Marked compression deformity of a vertebra at the thoracolumbar  junction, most likely T12, new since the prior chest radiograph. Electronically Signed   By: Lajean Manes M.D.   On: 04/21/2021 15:21   US Venous Img Lower Bilateral  Addendum Date: 04/22/2021   ADDENDUM REPORT: 04/21/2021 18:56 ADDENDUM: Correction to impression. Critical test results called to Dr. Kerman Passey (not Marjean Donna). Electronically Signed   By: Donavan Foil M.D.   On: 04/21/2021 18:56   Result Date: 04/21/2021 CLINICAL DATA:  Swelling EXAM: BILATERAL LOWER EXTREMITY VENOUS DOPPLER ULTRASOUND TECHNIQUE: Gray-scale sonography with graded compression, as well as color Doppler and duplex ultrasound were performed to evaluate the lower extremity deep venous systems from the level of the common femoral vein and including the common femoral, femoral, profunda femoral, popliteal and calf veins including the posterior tibial, peroneal and gastrocnemius veins when visible. The superficial great saphenous vein was also interrogated. Spectral Doppler was utilized to evaluate flow at rest and with distal augmentation maneuvers in the common femoral, femoral and popliteal veins. COMPARISON:  None. FINDINGS: RIGHT LOWER EXTREMITY Common Femoral Vein: Incompletely compressible with suspected small amount of nonocclusive peripheral thrombus. Saphenofemoral Junction: Incompletely compressible with suspected small amount of nonocclusive peripheral thrombus. Profunda Femoral Vein: No evidence of thrombus. Normal compressibility and flow on color Doppler imaging. Femoral Vein: Incompletely compressible distal femoral vein with possible small amount of nonocclusive thrombus. Popliteal Vein: No evidence of thrombus. Normal compressibility, respiratory phasicity and response to augmentation. Calf Veins: No evidence of thrombus. Normal compressibility and flow on color Doppler imaging. Superficial Great Saphenous Vein: No evidence of thrombus. Normal compressibility. Venous Reflux:  None. Other Findings:  None. LEFT  LOWER EXTREMITY Common Femoral Vein: No evidence of thrombus. Normal compressibility, respiratory phasicity and response to augmentation. Saphenofemoral Junction: No evidence of thrombus. Normal compressibility and flow on color Doppler imaging. Profunda Femoral Vein: No evidence of thrombus. Normal compressibility and flow on color Doppler imaging. Femoral Vein: No evidence of thrombus. Normal compressibility, respiratory phasicity and response to augmentation. Popliteal Vein: No evidence of thrombus. Normal compressibility, respiratory phasicity and response to augmentation. Calf Veins: No evidence of thrombus. Normal compressibility and flow on color Doppler imaging. Superficial Great Saphenous Vein: No evidence of thrombus. Normal compressibility. Venous Reflux:  None. Other Findings:  None. IMPRESSION: 1. Suspect small amount of nonocclusive DVT within the right common femoral and distal femoral vein. 2. Negative for acute left lower extremity DVT. Critical Value/emergent results were called by telephone at the time of interpretation on 04/21/2021 at 6:05 pm to provider [facility] , who verbally acknowledged these results. Electronically Signed: By: Donavan Foil M.D. On: 04/21/2021 18:07        Scheduled Meds:  vitamin C  1,000 mg Oral Daily   aspirin EC  81 mg Oral Daily   atorvastatin  40 mg Oral Daily  carvedilol  3.125 mg Oral BID   feeding supplement  1 Bottle Oral QODAY   levothyroxine  25 mcg Oral Daily   pantoprazole (PROTONIX) IV  40 mg Intravenous Q12H   spironolactone  12.5 mg Oral Daily   vitamin B-12  1,000 mcg Oral Daily   Continuous Infusions:  heparin 700 Units/hr (04/22/21 0434)   lactated ringers 100 mL/hr at 04/21/21 2103    Assessment & Plan:   Principal Problem:   Acute deep vein thrombosis (DVT) of left lower extremity (HCC) Active Problems:   CAD in native artery   Chronic combined systolic and diastolic heart failure (HCC)   GERD without esophagitis   COPD  with asthma ([place])   Acute pulmonary embolism ([place])   Hypoxia   AKI (acute kidney injury) ([place])   Acute deep vein thrombosis (DVT) of left lower extremity ([place]) Vq scan negative. Unable to do cta due to underlying CKD. Spoke to daughter if pt has hx of falls, she said one fall where had hip fx few years ago but no recent falls. Pt walks with walker and has no issues . D/w daughter about a/c risk including brain hemorrhage she verbalizes an understanding. Continue heparin gtt x48hrs, then will switch to po Eliquis if hemodynamics stable.       Acute hypoxic respiratory failure Likely due to atelectasis and acute chf.  Appears volume overloaded, BNP very high from baseline will give lasix iv gentle diuresis Incentive spirometer.     AKI on CKD stage IIIb Baseline 1.29-1.4 Improved, at baseline. Avoid nephrotoxic meds and contrast   CAD in native artery Elevated Troponins: Cards was consulted Likely demand supply mismatch with underlying cad No  plans invasive w/u  Echo pending.   Chronic combined systolic and diastolic heart failure (HCC) Appears volume overloaded Dc ivf Echo pending Gentle prn diuresis Monitor labs     GERD without esophagitis Continue on PPI     COPD with asthma ([place]) Continue home mdi Will start albuterol nebs prn      DVT prophylaxis: heparin Code Status:full  Family Communication: daughter at bedside Disposition Plan:  Status is: inpatient  The patient will require care spanning > 2 midnights and should be moved to inpatient because: Inpatient level of care appropriate due to severity of illness  Dispo: The patient is from: Home              Anticipated d/c is to: Home              Patient currently is not medically stable to d/c.   Difficult to place patient No            LOS: 0 days   Time spent: 45 min with >50% on coc    Nolberto Hanlon, MD [facility] Pager [phone]  If 7PM-7AM, please contact  night-coverage 04/22/2021, 8:27 AM

## 2021-04-23 DIAGNOSIS — R0902 Hypoxemia: Secondary | ICD-10-CM

## 2021-04-23 DIAGNOSIS — J449 Chronic obstructive pulmonary disease, unspecified: Secondary | ICD-10-CM

## 2021-04-23 DIAGNOSIS — K219 Gastro-esophageal reflux disease without esophagitis: Secondary | ICD-10-CM

## 2021-04-23 LAB — BASIC METABOLIC PANEL
Anion gap: 6 (ref 5–15)
BUN: 49 mg/dL — ABNORMAL HIGH (ref 8–23)
CO2: 29 mmol/L (ref 22–32)
Calcium: 8.3 mg/dL — ABNORMAL LOW (ref 8.9–10.3)
Chloride: 101 mmol/L (ref 98–111)
Creatinine, Ser: 1.58 mg/dL — ABNORMAL HIGH (ref 0.44–1.00)
GFR, Estimated: 30 mL/min — ABNORMAL LOW (ref >60)
Glucose, Bld: 106 mg/dL — ABNORMAL HIGH (ref 70–99)
Potassium: 4.4 mmol/L (ref 3.5–5.1)
Sodium: 136 mmol/L (ref 135–145)

## 2021-04-23 LAB — CBC
HCT: 33 % — ABNORMAL LOW (ref 36.0–46.0)
Hemoglobin: 10.8 g/dL — ABNORMAL LOW (ref 12.0–15.0)
MCH: 31.7 pg (ref 26.0–34.0)
MCHC: 32.7 g/dL (ref 30.0–36.0)
MCV: 96.8 fL (ref 80.0–100.0)
Platelets: 200 10*3/uL (ref 150–400)
RBC: 3.41 MIL/uL — ABNORMAL LOW (ref 3.87–5.11)
RDW: 13.7 % (ref 11.5–15.5)
WBC: 13.2 10*3/uL — ABNORMAL HIGH (ref 4.0–10.5)
nRBC: 0 % (ref 0.0–0.2)

## 2021-04-23 LAB — HEPARIN LEVEL (UNFRACTIONATED): Heparin Unfractionated: 0.48 IU/mL (ref 0.30–0.70)

## 2021-04-23 MED ORDER — PROMETHAZINE HCL 25 MG PO TABS: 25.0000 mg | ORAL_TABLET | Freq: Four times a day (QID) | ORAL | Status: DC | PRN

## 2021-04-23 MED ORDER — APIXABAN 5 MG PO TABS: 5.0000 mg | ORAL_TABLET | Freq: Two times a day (BID) | ORAL | Status: DC

## 2021-04-23 MED ORDER — APIXABAN 5 MG PO TABS
10.0000 mg | ORAL_TABLET | Freq: Two times a day (BID) | ORAL | Status: DC
  Administered 2021-04-24 – 2021-04-27 (×7): 10 mg via ORAL
  Filled 2021-04-23 (×7): qty 2

## 2021-04-23 NOTE — Consult Note (Addendum)
[place] for heparin infusion Indication: pulmonary embolus  Allergies  Allergen Reactions   Penicillins     Has patient had a PCN reaction causing immediate rash, facial/tongue/throat swelling, SOB or lightheadedness with hypotension: Unknown Has patient had a PCN reaction causing severe rash involving mucus membranes or skin necrosis: Unknown Has patient had a PCN reaction that required hospitalization No Has patient had a PCN reaction occurring within the last 10 years: No If all of the above answers are "NO", then may proceed with Cephalosporin use.    Patient Measurements: Height: 5\' 4"  (162.6 cm) Weight: 40.3 kg (88 lb 13.5 oz) IBW/kg (Calculated) : 54.7 Heparin Dosing Weight: 49.9 kg  Vital Signs: Temp: 98.4 F (36.9 C) (06/25 2104) BP: 142/62 (06/25 2104) Pulse Rate: 86 (06/25 2104)  Labs: Recent Labs    04/21/21 1417 04/21/21 1417 04/21/21 1855 04/21/21 2100 04/21/21 2343 04/22/21 0042 04/22/21 0331 04/22/21 1251 04/22/21 2110 04/23/21 0408  HGB 12.9  --   --   --   --   --  11.7*  --   --  10.8*  HCT 39.2  --   --   --   --   --  36.0  --   --  33.0*  PLT 231  --   --   --   --   --  207  --   --  200  APTT  --   --  26  --   --   --  >200*  --   --   --   LABPROT  --   --  14.0  --   --   --   --   --   --   --   INR  --   --  1.1  --   --   --   --   --   --   --   HEPARINUNFRC  --    < >  --   --   --   --  0.96* 0.41 0.41 0.48  CREATININE 1.50*  --   --   --   --   --  1.45*  --   --  1.58*  TROPONINIHS 239*  --   --  366* 440* 514*  --   --   --   --    < > = values in this interval not displayed.     Estimated Creatinine Clearance: 14.2 mL/min (A) (by C-G formula based on SCr of 1.58 mg/dL (H)).   Medical History: Past Medical History:  Diagnosis Date   COPD (chronic obstructive pulmonary disease) ([place])    Coronary artery disease 03/06/2013   90% prox LAD stenosis s/p DES   Difficult intubation     Dizziness    GERD (gastroesophageal reflux disease)    Hypotension    Ischemic cardiomyopathy 03/09/2013   s/p NSTEMI. EF 30-35%.   MI (myocardial infarction) ([place])    PAF (paroxysmal atrial fibrillation) ([place]) 03/06/2013   In setting of NSTEMI and reperfusion, no recurrence   SVT (supraventricular tachycardia) (HCC)    Self-limited on outpatient cardiac monitor    Medications:  No prior AC noted  Assessment: 87 y.o. female with history of A. Fib -- not on anticoagulation due to high fall risk-- presents with leg swelling/chest pain. Pharmacy has been consulted for initiation and management of heparin infusion for pulmonary embolism.   Goal of Therapy:  Heparin level 0.3-0.7 units/ml Monitor  platelets by anticoagulation protocol: Yes   Plan:  6/26:  HL @ 0408 = 0.48, therapeutic X 3  Will continue pt on current rate and recheck HL on 6/27 with AM labs.   Orene Desanctis, PharmD Clinical Pharmacist   04/23/2021,5:34 AM

## 2021-04-23 NOTE — Progress Notes (Signed)
Hematoma noted on right arm above IV site (poss. from lab draw).  Paged the MD.  MD requested to apply warm compress to site.   Called pharmacy- per MD  Asked them to adjust heparin stop time and to start Eliquis (DVT treatment protocol) in the morning.

## 2021-04-23 NOTE — Evaluation (Signed)
Physical Therapy Evaluation Patient Details Name: Linda Hart MRN: [id number] DOB: 14-Jun-1925 Today's Date: 04/23/2021   History of Present Illness  presented to ER secondary to chest pain, LE edema; admitted for management of acute R LE DVT (managed with heparin), increased troponin (demand per chart).  Clinical Impression  Patient seated in recliner upon arrival to room; daughter at bedside, supportive and encouraging to patient.  Patient alert and oriented to self; unaware of location and general situation.  Does follow commands, but with noted difficulty with new learning due to [facility] deficits (different than baseline per daughter's report).  Denies pain at this time.  Bilat UE/LE strength and ROM grossly symmetrical and WFL; no focal weakness reported.  Able to complete sit/stand, basic transfers and gait (100') with RW, cga/min assist.  Demonstrates shuffling steps with partially reciprocal stepping; forward flexed, kyphotic posture; decreased cadence/gait speed.  Good use/negotiation of RW. Mod SOB with distance (BORG 8/10) with desat 86% on 2L supplemental O2; requiring approx 2 min seated rest and pursed lip breathing for recovery >90% on 2L.  Additional distance/activity limited as result. Would benefit from skilled PT to address above deficits and promote optimal return to PLOF.; recommend transition to STR upon discharge from acute hospitalization, as patient lacks functional endurance necessary to safely return to ALF.     Follow Up Recommendations SNF    Equipment Recommendations       Recommendations for Other Services       Precautions / Restrictions Precautions Precautions: Fall Restrictions Weight Bearing Restrictions: No      Mobility  Bed Mobility               General bed mobility comments: seated in recliner beginning/end of treatment session    Transfers Overall transfer level: Needs assistance Equipment used: Rolling walker (2 wheeled) Transfers:  Sit to/from Stand Sit to Stand: Min guard         General transfer comment: good hand placement and safety with transfers  Ambulation/Gait Ambulation/Gait assistance: Min guard;Min assist Gait Distance (Feet): 100 Feet Assistive device: Rolling walker (2 wheeled)       General Gait Details: shuffling steps with partially reciprocal stepping; forward flexed, kyphotic posture; decreased cadence/gait speed.  Good use/negotiation of RW. Mod SOB with distance (BORG 8/10) with desat 86% on 2L supplemental O2; requiring approx 2 min seated rest and pursed lip breathing for recovery >90% on 2L  Stairs            Wheelchair Mobility    Modified Rankin (Stroke Patients Only)       Balance Overall balance assessment: Needs assistance Sitting-balance support: No upper extremity supported;Feet supported Sitting balance-Leahy Scale: Good     Standing balance support: Bilateral upper extremity supported Standing balance-Leahy Scale: Fair                               Pertinent Vitals/Pain Pain Assessment: No/denies pain    Home Living Family/patient expects to be discharged to:: Assisted living               Home Equipment: Walker - standard Additional Comments: Ambulates to/from community room for meals (approx 50' from room)    Prior Function Level of Independence: Independent with assistive device(s)         Comments: Mod indep with standard walker for ADLs, household mobilization; denies fall history; no home O2.     Hand Dominance  Extremity/Trunk Assessment   Upper Extremity Assessment Upper Extremity Assessment: Overall WFL for tasks assessed    Lower Extremity Assessment Lower Extremity Assessment: Generalized weakness (grossly 4-/5 throughout bilat LEs; mod edema mid-calf distally bilat LEs)       Communication   Communication: HOH  Cognition Arousal/Alertness: Awake/alert Behavior During Therapy: WFL for tasks  assessed/performed Overall Cognitive Status: Impaired/Different from baseline                                 General Comments: oriented to self, pleasant and cooperative; generally unaware of location, reason for admission.  Follows commands, but limited insight into deficits and functional implications.  Limited recall and integration of new learning.      General Comments      Exercises Other Exercises Other Exercises: Reviewed role of PT and progressive mobility; introduced education on signs/symptoms of fatigue, pursed lip breathing, activity pacing.  Patient with difficulty fully comprehending, integrating new learning (noted STM deficits).  Will continue to reinforce throughout stay as needed.   Assessment/Plan    PT Assessment Patient needs continued PT services  PT Problem List Decreased strength;Decreased activity tolerance;Decreased balance;Decreased mobility;Decreased knowledge of use of DME;Decreased cognition;Decreased safety awareness;Decreased knowledge of precautions;Cardiopulmonary status limiting activity       PT Treatment Interventions DME instruction;Gait training;Functional mobility training;Therapeutic activities;Therapeutic exercise;Balance training;Neuromuscular re-education;Patient/family education;Cognitive remediation    PT Goals (Current goals can be found in the Care Plan section)  Acute Rehab PT Goals Patient Stated Goal: per daughter, to go to rehab before returning to ALF PT Goal Formulation: With patient Time For Goal Achievement: 05/07/21 Potential to Achieve Goals: Good    Frequency Min 2X/week   Barriers to discharge        Co-evaluation               AM-PAC PT "6 Clicks" Mobility  Outcome Measure Help needed turning from your back to your side while in a flat bed without using bedrails?: A Little Help needed moving from lying on your back to sitting on the side of a flat bed without using bedrails?: A Little Help  needed moving to and from a bed to a chair (including a wheelchair)?: A Little Help needed standing up from a chair using your arms (e.g., wheelchair or bedside chair)?: A Little Help needed to walk in hospital room?: A Little Help needed climbing 3-5 steps with a railing? : A Little 6 Click Score: 18    End of Session Equipment Utilized During Treatment: Gait belt;Oxygen Activity Tolerance: Patient tolerated treatment well Patient left: in chair;with call bell/phone within reach;with chair alarm set Nurse Communication: Mobility status PT Visit Diagnosis: Muscle weakness (generalized) (M62.81);Difficulty in walking, not elsewhere classified (R26.2)    Time: 0272-5366 PT Time Calculation (min) (ACUTE ONLY): 39 min   Charges:   PT Evaluation $PT Eval Moderate Complexity: 1 Mod PT Treatments $Therapeutic Activity: 23-37 mins        Pearley Millington H. Owens Shark, PT, DPT, NCS 04/23/21, 3:36 PM [phone]

## 2021-04-23 NOTE — Progress Notes (Signed)
PROGRESS NOTE    Linda Hart  ZOX:[id number] DOB: 12-Dec-1924 DOA: 04/21/2021 PCP: Kirk Ruths, MD    Brief Narrative:  Linda Hart is a 87 y.o. female with medical history significant for coronary artery disease with stent placement, chronic systolic and diastolic congestive heart failure with EF 40-45% in 2016, COPD, GERD, who presents to the emergency department on 04/21/2021 with shortness of breath.  Patient reports she is tired and she is not giving very much history at all, so history is obtained from the emergency department physician and the medical record.  Per report patient had shortness of breath and chest pain that began on 04/20/2021; at the time of admission patient denies that she had any chest pain, but does admit to shortness of breath.  On 04/20/2021, the patient was given nitroglycerin for her chest pain.  On 04/21/2021, the patient was noted to be short of breath and she was also noted to have swelling in her left lower extremity.  Symptoms were alleviated by nothing and exacerbated by nothing.  Shortness of breath is constant.  Associated symptoms: Per report patient had chest pain on the day before admission.  She has left lower leg swelling. Ultrasound of the left lower extremity revealed a nonocclusive left leg DVT.  CTA not done due to underlying kidney function.  VQ scan was done which was negative.  Currently on heparin drip  6/25 daugther at bedside reports mom more sob this am.Daughter 6/26-from respiratory status doing well. Non tachypnic this am. Lying flat in bed.    Consultants:  cardiology  Procedures:   Antimicrobials:      Subjective: Feels tired, breathing better today, no cp  Objective: Vitals:   04/22/21 2104 04/23/21 0500 04/23/21 0629 04/23/21 0630  BP: (!) 142/62  121/76   Pulse: 86  84   Resp:      Temp: 98.4 F (36.9 C)  (!) 97.5 F (36.4 C)   TempSrc:   Oral   SpO2: 100%  100%   Weight:  40.3 kg  44.2 kg  Height:         Intake/Output Summary (Last 24 hours) at 04/23/2021 0827 Last data filed at 04/22/2021 1438 Gross per 24 hour  Intake 738.89 ml  Output 200 ml  Net 538.89 ml   Filed Weights   04/22/21 0500 04/23/21 0500 04/23/21 0630  Weight: 37.7 kg 40.3 kg 44.2 kg    Examination:  General exam: Appears calm, tired, mildly wheezing.  Respiratory system: decrease bs , scattered rales at bases, end expiratory wheezing.  Cardiovascular system: S1 & S2 heard, RRR. +JVD Gastrointestinal system: Abdomen is nondistended, soft and nontender. Normal bowel sounds heard. Central nervous system: Alert and oriented to place and person, not date. Grossly intact Extremities: +LLE swelling. Skin: warm, dry Psychiatry:  Mood & affect appropriate in current setting.     Data Reviewed: I have personally reviewed following labs and imaging studies  CBC: Recent Labs  Lab 04/21/21 1417 04/22/21 0331 04/23/21 0408  WBC 18.6* 19.1* 13.2*  HGB 12.9 11.7* 10.8*  HCT 39.2 36.0 33.0*  MCV 96.8 97.0 96.8  PLT 231 207 811   Basic Metabolic Panel: Recent Labs  Lab 04/21/21 1417 04/22/21 0331 04/23/21 0408  NA 133* 135 136  K 5.1 4.9 4.4  CL 101 103 101  CO2 24 25 29   GLUCOSE 211* 148* 106*  BUN 40* 42* 49*  CREATININE 1.50* 1.45* 1.58*  CALCIUM 9.1 8.6* 8.3*   GFR: Estimated Creatinine  Clearance: 15.5 mL/min (A) (by C-G formula based on SCr of 1.58 mg/dL (H)). Liver Function Tests: Recent Labs  Lab 04/21/21 1417  AST 28  ALT 16  ALKPHOS 83  BILITOT 1.6*  PROT 6.6  ALBUMIN 3.4*   No results for input(s): LIPASE, AMYLASE in the last 168 hours. No results for input(s): AMMONIA in the last 168 hours. Coagulation Profile: Recent Labs  Lab 04/21/21 1855  INR 1.1   Cardiac Enzymes: No results for input(s): CKTOTAL, CKMB, CKMBINDEX, TROPONINI in the last 168 hours. BNP (last 3 results) No results for input(s): PROBNP in the last 8760 hours. HbA1C: No results for input(s): HGBA1C in the  last 72 hours. CBG: No results for input(s): GLUCAP in the last 168 hours. Lipid Profile: Recent Labs    04/22/21 0331  CHOL 151  HDL 71  LDLCALC 74  TRIG 29  CHOLHDL 2.1   Thyroid Function Tests: No results for input(s): TSH, T4TOTAL, FREET4, T3FREE, THYROIDAB in the last 72 hours. Anemia Panel: No results for input(s): VITAMINB12, FOLATE, FERRITIN, TIBC, IRON, RETICCTPCT in the last 72 hours. Sepsis Labs: No results for input(s): PROCALCITON, LATICACIDVEN in the last 168 hours.  Recent Results (from the past 240 hour(s))  Resp Panel by RT-PCR (Flu A&B, Covid) Nasopharyngeal Swab     Status: None   Collection Time: 04/21/21  3:03 PM   Specimen: Nasopharyngeal Swab; Nasopharyngeal(NP) swabs in vial transport medium  Result Value Ref Range Status   SARS Coronavirus 2 by RT PCR NEGATIVE NEGATIVE Final    Comment: (NOTE) SARS-CoV-2 target nucleic acids are NOT DETECTED.  The SARS-CoV-2 RNA is generally detectable in upper respiratory specimens during the acute phase of infection. The lowest concentration of SARS-CoV-2 viral copies this assay can detect is 138 copies/mL. A negative result does not preclude SARS-Cov-2 infection and should not be used as the sole basis for treatment or other patient management decisions. A negative result may occur with  improper specimen collection/handling, submission of specimen other than nasopharyngeal swab, presence of viral mutation(s) within the areas targeted by this assay, and inadequate number of viral copies(<138 copies/mL). A negative result must be combined with clinical observations, patient history, and epidemiological information. The expected result is Negative.  Fact Sheet for Patients:  EntrepreneurPulse.com.au  Fact Sheet for Healthcare Providers:  IncredibleEmployment.be  This test is no t yet approved or cleared by the [place] FDA and  has been authorized for detection and/or  diagnosis of SARS-CoV-2 by FDA under an Emergency Use Authorization (EUA). This EUA will remain  in effect (meaning this test can be used) for the duration of the COVID-19 declaration under Section 564(b)(1) of the Act, 21 U.S.C.section 360bbb-3(b)(1), unless the authorization is terminated  or revoked sooner.       Influenza A by PCR NEGATIVE NEGATIVE Final   Influenza B by PCR NEGATIVE NEGATIVE Final    Comment: (NOTE) The Xpert Xpress SARS-CoV-2/FLU/RSV plus assay is intended as an aid in the diagnosis of influenza from Nasopharyngeal swab specimens and should not be used as a sole basis for treatment. Nasal washings and aspirates are unacceptable for Xpert Xpress SARS-CoV-2/FLU/RSV testing.  Fact Sheet for Patients: EntrepreneurPulse.com.au  Fact Sheet for Healthcare Providers: IncredibleEmployment.be  This test is not yet approved or cleared by the [place] FDA and has been authorized for detection and/or diagnosis of SARS-CoV-2 by FDA under an Emergency Use Authorization (EUA). This EUA will remain in effect (meaning this test can be used) for the duration  of the COVID-19 declaration under Section 564(b)(1) of the Act, 21 U.S.C. section 360bbb-3(b)(1), unless the authorization is terminated or revoked.  Performed at [facility], [address] [place], [place] [address]   MRSA Next Gen by PCR, Nasal     Status: None   Collection Time: 04/22/21  2:00 PM  Result Value Ref Range Status   MRSA by PCR Next Gen NOT DETECTED NOT DETECTED Final    Comment: (NOTE) The GeneXpert MRSA Assay (FDA approved for NASAL specimens only), is one component of a comprehensive MRSA colonization surveillance program. It is not intended to diagnose MRSA infection nor to guide or monitor treatment for MRSA infections. Test performance is not FDA approved in patients less than 33 years old. Performed at [facility] [address] [place], [place] [address]          Radiology Studies: DG Chest 2 View  Result Date: 04/21/2021 CLINICAL DATA:  Difficulty breathing. Patient unable to specify duration of current complaint. Hx of COPD, CAD, HTN, MI, PAF, SVT. EXAM: CHEST - 2 VIEW COMPARISON:  12/20/2018 and older exams. FINDINGS: Cardiac silhouette is normal in size. No mediastinal or hilar masses or evidence of adenopathy. Lungs are hyperexpanded. Small bilateral pleural effusions. Additional lung base opacity also noted consistent with atelectasis. Remainder of the lungs is clear. No pneumothorax. Marked compression deformity of what is either T12 or L1, new since the most recent prior study. No other fractures. IMPRESSION: 1. Hyperexpanded lungs with small effusions and mild basilar atelectasis, but no convincing pneumonia or pulmonary edema. 2. Marked compression deformity of a vertebra at the thoracolumbar junction, most likely T12, new since the prior chest radiograph. Electronically Signed   By: Lajean Manes M.D.   On: 04/21/2021 15:21   NM Pulmonary Perfusion  Result Date: 04/22/2021 CLINICAL DATA:  PE suspected EXAM: NUCLEAR MEDICINE PERFUSION LUNG SCAN TECHNIQUE: Perfusion images were obtained in multiple projections after intravenous injection of radiopharmaceutical. Ventilation scans intentionally deferred if perfusion scan and chest x-ray adequate for interpretation during COVID 19 epidemic. RADIOPHARMACEUTICALS:  3.89 mCi Tc-47m MAA IV COMPARISON:  Chest radiograph, 04/21/2021 FINDINGS: Normal, homogeneous perfusion of the lungs. No suspicious filling defect. IMPRESSION: Very low probability examination for pulmonary embolism by modified perfusion only PIOPED criteria (PE absent). Electronically Signed   By: Eddie Candle M.D.   On: 04/22/2021 11:51   US Venous Img Lower Bilateral  Addendum Date: 04/22/2021   ADDENDUM REPORT: 04/21/2021 18:56 ADDENDUM: Correction to impression. Critical test results called to  Dr. Kerman Passey (not Marjean Donna). Electronically Signed   By: Donavan Foil M.D.   On: 04/21/2021 18:56   Result Date: 04/21/2021 CLINICAL DATA:  Swelling EXAM: BILATERAL LOWER EXTREMITY VENOUS DOPPLER ULTRASOUND TECHNIQUE: Gray-scale sonography with graded compression, as well as color Doppler and duplex ultrasound were performed to evaluate the lower extremity deep venous systems from the level of the common femoral vein and including the common femoral, femoral, profunda femoral, popliteal and calf veins including the posterior tibial, peroneal and gastrocnemius veins when visible. The superficial great saphenous vein was also interrogated. Spectral Doppler was utilized to evaluate flow at rest and with distal augmentation maneuvers in the common femoral, femoral and popliteal veins. COMPARISON:  None. FINDINGS: RIGHT LOWER EXTREMITY Common Femoral Vein: Incompletely compressible with suspected small amount of nonocclusive peripheral thrombus. Saphenofemoral Junction: Incompletely compressible with suspected small amount of nonocclusive peripheral thrombus. Profunda Femoral Vein: No evidence of thrombus. Normal compressibility and flow on color Doppler  imaging. Femoral Vein: Incompletely compressible distal femoral vein with possible small amount of nonocclusive thrombus. Popliteal Vein: No evidence of thrombus. Normal compressibility, respiratory phasicity and response to augmentation. Calf Veins: No evidence of thrombus. Normal compressibility and flow on color Doppler imaging. Superficial Great Saphenous Vein: No evidence of thrombus. Normal compressibility. Venous Reflux:  None. Other Findings:  None. LEFT LOWER EXTREMITY Common Femoral Vein: No evidence of thrombus. Normal compressibility, respiratory phasicity and response to augmentation. Saphenofemoral Junction: No evidence of thrombus. Normal compressibility and flow on color Doppler imaging. Profunda Femoral Vein: No evidence of thrombus. Normal  compressibility and flow on color Doppler imaging. Femoral Vein: No evidence of thrombus. Normal compressibility, respiratory phasicity and response to augmentation. Popliteal Vein: No evidence of thrombus. Normal compressibility, respiratory phasicity and response to augmentation. Calf Veins: No evidence of thrombus. Normal compressibility and flow on color Doppler imaging. Superficial Great Saphenous Vein: No evidence of thrombus. Normal compressibility. Venous Reflux:  None. Other Findings:  None. IMPRESSION: 1. Suspect small amount of nonocclusive DVT within the right common femoral and distal femoral vein. 2. Negative for acute left lower extremity DVT. Critical Value/emergent results were called by telephone at the time of interpretation on 04/21/2021 at 6:05 pm to provider [facility] , who verbally acknowledged these results. Electronically Signed: By: Donavan Foil M.D. On: 04/21/2021 18:07   ECHOCARDIOGRAM COMPLETE  Result Date: 04/22/2021    ECHOCARDIOGRAM REPORT   Patient Name:   Linda Hart Date of Exam: 04/22/2021 Medical Rec #:  [id number]      Height:       64.0 in Accession #:    [id number]     Weight:       83.1 lb Date of Birth:  07-26-1925     BSA:          1.347 m Patient Age:    39 years       BP:           176/89 mmHg Patient Gender: F              HR:           100 bpm. Exam Location:  [facility] Procedure: 2D Echo, Color Doppler and Cardiac Doppler Indications:     Pulmonary Embolus I26.09  History:         Patient has prior history of Echocardiogram examinations. CAD;                  Arrythmias:Atrial Fibrillation.  Sonographer:     Alyse Low Roar Referring Phys:  [id number] Tacey Ruiz Diagnosing Phys: Kate Sable MD IMPRESSIONS  1. Left ventricular ejection fraction, by estimation, is 35 to 40%. The left ventricle has moderately decreased function. The left ventricle demonstrates global hypokinesis. Left ventricular diastolic parameters are consistent with Grade II diastolic dysfunction  (pseudonormalization).  2. Right ventricular systolic function is mildly reduced. The right ventricular size is normal. There is normal pulmonary artery systolic pressure.  3. Left atrial size was mild to moderately dilated.  4. The mitral valve is grossly normal. Mild to moderate mitral valve regurgitation.  5. The aortic valve is calcified. Aortic valve regurgitation is mild. Mild aortic valve stenosis.  6. The inferior vena cava is normal in size with <50% respiratory variability, suggesting right atrial pressure of 8 mmHg. FINDINGS  Left Ventricle: Left ventricular ejection fraction, by estimation, is 35 to 40%. The left ventricle has moderately decreased function. The left ventricle demonstrates global hypokinesis. The left ventricular internal cavity  size was normal in size. There is no left ventricular hypertrophy. Left ventricular diastolic parameters are consistent with Grade II diastolic dysfunction (pseudonormalization). Right Ventricle: The right ventricular size is normal. No increase in right ventricular wall thickness. Right ventricular systolic function is mildly reduced. There is normal pulmonary artery systolic pressure. The tricuspid regurgitant velocity is 2.08 m/s, and with an assumed right atrial pressure of 8 mmHg, the estimated right ventricular systolic pressure is 18.8 mmHg. Left Atrium: Left atrial size was mild to moderately dilated. Right Atrium: Right atrial size was normal in size. Pericardium: There is no evidence of pericardial effusion. Mitral Valve: The mitral valve is grossly normal. Mild to moderate mitral valve regurgitation. Tricuspid Valve: The tricuspid valve is normal in structure. Tricuspid valve regurgitation is mild. Aortic Valve: The aortic valve is calcified. Aortic valve regurgitation is mild. Aortic regurgitation PHT measures 329 msec. Mild aortic stenosis is present. Aortic valve mean gradient measures 5.0 mmHg. Aortic valve peak gradient measures 9.5 mmHg. Aortic  valve area, by VTI measures 1.65 cm. Pulmonic Valve: The pulmonic valve was not well visualized. Pulmonic valve regurgitation is not visualized. Aorta: The aortic root is normal in size and structure. Venous: The inferior vena cava is normal in size with less than 50% respiratory variability, suggesting right atrial pressure of 8 mmHg. IAS/Shunts: No atrial level shunt detected by color flow Doppler.  LEFT VENTRICLE PLAX 2D LVIDd:         4.60 cm     Diastology LVIDs:         3.80 cm     LV e' medial:    3.15 cm/s LV PW:         0.90 cm     LV E/e' medial:  20.7 LV IVS:        0.90 cm     LV e' lateral:   6.31 cm/s LVOT diam:     2.10 cm     LV E/e' lateral: 10.3 LV SV:         50 LV SV Index:   37 LVOT Area:     3.46 cm  LV Volumes (MOD) LV vol d, MOD A2C: 79.0 ml LV vol d, MOD A4C: 66.7 ml LV vol s, MOD A2C: 50.1 ml LV vol s, MOD A4C: 45.5 ml LV SV MOD A2C:     28.9 ml LV SV MOD A4C:     66.7 ml LV SV MOD BP:      28.0 ml RIGHT VENTRICLE RV Mid diam:    2.90 cm RV S prime:     7.07 cm/s TAPSE (M-mode): 1.3 cm LEFT ATRIUM             Index       RIGHT ATRIUM           Index LA diam:        4.30 cm 3.19 cm/m  RA Area:     13.20 cm LA Vol (A2C):   70.9 ml 52.63 ml/m RA Volume:   32.60 ml  24.20 ml/m LA Vol (A4C):   52.7 ml 39.12 ml/m LA Biplane Vol: 61.1 ml 45.36 ml/m  AORTIC VALVE                    PULMONIC VALVE AV Area (Vmax):    1.59 cm     PV Vmax:        0.66 m/s AV Area (Vmean):   1.54 cm     PV Peak  grad:   1.8 mmHg AV Area (VTI):     1.65 cm     RVOT Peak grad: 0 mmHg AV Vmax:           154.00 cm/s AV Vmean:          103.000 cm/s AV VTI:            0.301 m AV Peak Grad:      9.5 mmHg AV Mean Grad:      5.0 mmHg LVOT Vmax:         70.50 cm/s LVOT Vmean:        45.700 cm/s LVOT VTI:          0.143 m LVOT/AV VTI ratio: 0.48 AI PHT:            329 msec  AORTA Ao Root diam: 2.60 cm MITRAL VALVE               TRICUSPID VALVE MV Area (PHT): 4.17 cm    TR Peak grad:   17.3 mmHg MV Decel Time: 182 msec     TR Vmax:        208.00 cm/s MV E velocity: 65.10 cm/s MV A velocity: 69.40 cm/s  SHUNTS MV E/A ratio:  0.94        Systemic VTI:  0.14 m MV A Prime:    7.2 cm/s    Systemic Diam: 2.10 cm Kate Sable MD Electronically signed by Kate Sable MD Signature Date/Time: 04/22/2021/6:10:27 PM    Final         Scheduled Meds:  vitamin C  1,000 mg Oral Daily   aspirin EC  81 mg Oral Daily   atorvastatin  40 mg Oral Daily   carvedilol  3.125 mg Oral BID   feeding supplement  1 Bottle Oral QODAY   levothyroxine  25 mcg Oral Daily   pantoprazole (PROTONIX) IV  40 mg Intravenous Q12H   spironolactone  12.5 mg Oral Daily   vitamin B-12  1,000 mcg Oral Daily   Continuous Infusions:  heparin 700 Units/hr (04/23/21 6073)    Assessment & Plan:   Principal Problem:   Acute deep vein thrombosis (DVT) of left lower extremity (HCC) Active Problems:   CAD in native artery   Chronic combined systolic and diastolic heart failure (HCC)   GERD without esophagitis   COPD with asthma (HCC)   Acute pulmonary embolism (HCC)   Hypoxia   AKI (acute kidney injury) ([place])   Acute deep vein thrombosis (DVT) of left lower extremity (HCC) Vq scan negative. Unable to do cta due to underlying CKD. Spoke to daughter if pt has hx of falls, she said one fall where had hip fx few years ago but no recent falls. Pt walks with walker and has no issues . D/w daughter about a/c risk including brain hemorrhage she verbalizes an understanding. Continue heparin gtt x48hrs, then will switch to po Eliquis if hemodynamics stable.  6/26- again I verified with daughter-patient has had frequent falls as he was noted in cardiology note.  Daughter denies that patient has had multiple falls.  In 2019 had 1 mechanical fall when she slipped and broke her hip.  Since then she has had no issues.  We had this discussion the importance of knowing this for treatment decisions placing IVC filter versus anticoagulation and complications  of anticoagulation.  After much consensus a discussion it appears patient does not have multiple falls and she is okay with proceeding with anticoagulation.  Will transition heparin drip to Eliquis    Acute hypoxic respiratory failure Likely due to atelectasis and acute CHF Improved with IV Lasix x1 yesterday Will wean down O2 keeping O2 more than 92% PT OT I-S      AKI on CKD stage IIIb Baseline 1.29-1.4 Minimally increased after giving Lasix. Continue to monitor Avoid nephrotoxic meds   CAD in native artery Elevated Troponins: Cards was consulted 6/26 likely elevated troponins due to demand ischemia Echo with EF 35 to 40% with global hypokinesis.  And grade 2 diastolic dysfunction     GERD without esophagitis Continue PPI     COPD with asthma ([place]) Continue home mdi Will start albuterol nebs prn      DVT prophylaxis: heparin Code Status:full  Family Communication: daughter at bedside Disposition Plan:  Status is: inpatient  The patient will require care spanning > 2 midnights and should be moved to inpatient because: Inpatient level of care appropriate due to severity of illness  Dispo: The patient is from: Home              Anticipated d/c is to: Home              Patient currently is not medically stable to d/c.   Difficult to place patient No            LOS: 1 day   Time spent: 35 min with >50% on coc    Nolberto Hanlon, MD [facility] Pager [phone]  If 7PM-7AM, please contact night-coverage 04/23/2021, 8:27 AM

## 2021-04-24 ENCOUNTER — Inpatient Hospital Stay: Payer: Medicare Other

## 2021-04-24 DIAGNOSIS — E43 Unspecified severe protein-calorie malnutrition: Secondary | ICD-10-CM | POA: Insufficient documentation

## 2021-04-24 LAB — BASIC METABOLIC PANEL
Anion gap: 6 (ref 5–15)
BUN: 52 mg/dL — ABNORMAL HIGH (ref 8–23)
CO2: 27 mmol/L (ref 22–32)
Calcium: 8.1 mg/dL — ABNORMAL LOW (ref 8.9–10.3)
Chloride: 104 mmol/L (ref 98–111)
Creatinine, Ser: 1.74 mg/dL — ABNORMAL HIGH (ref 0.44–1.00)
GFR, Estimated: 27 mL/min — ABNORMAL LOW (ref >60)
Glucose, Bld: 88 mg/dL (ref 70–99)
Potassium: 4.6 mmol/L (ref 3.5–5.1)
Sodium: 137 mmol/L (ref 135–145)

## 2021-04-24 LAB — CBC
HCT: 31.4 % — ABNORMAL LOW (ref 36.0–46.0)
Hemoglobin: 10.2 g/dL — ABNORMAL LOW (ref 12.0–15.0)
MCH: 31.8 pg (ref 26.0–34.0)
MCHC: 32.5 g/dL (ref 30.0–36.0)
MCV: 97.8 fL (ref 80.0–100.0)
Platelets: 196 10*3/uL (ref 150–400)
RBC: 3.21 MIL/uL — ABNORMAL LOW (ref 3.87–5.11)
RDW: 13.6 % (ref 11.5–15.5)
WBC: 11.7 10*3/uL — ABNORMAL HIGH (ref 4.0–10.5)
nRBC: 0 % (ref 0.0–0.2)

## 2021-04-24 LAB — BRAIN NATRIURETIC PEPTIDE: B Natriuretic Peptide: 769.3 pg/mL — ABNORMAL HIGH (ref 0.0–100.0)

## 2021-04-24 LAB — SARS CORONAVIRUS 2 (TAT 6-24 HRS): SARS Coronavirus 2: POSITIVE — AB

## 2021-04-24 MED ORDER — PANTOPRAZOLE SODIUM 40 MG PO TBEC
40.0000 mg | DELAYED_RELEASE_TABLET | Freq: Every day | ORAL | Status: DC
  Administered 2021-04-25 – 2021-04-27 (×3): 40 mg via ORAL
  Filled 2021-04-24 (×3): qty 1

## 2021-04-24 MED ORDER — CALCIUM CARBONATE ANTACID 500 MG PO CHEW
1.0000 | CHEWABLE_TABLET | Freq: Every day | ORAL | Status: DC
  Administered 2021-04-24 – 2021-04-27 (×4): 200 mg via ORAL
  Filled 2021-04-24 (×4): qty 1

## 2021-04-24 MED ORDER — ADULT MULTIVITAMIN W/MINERALS CH
1.0000 | ORAL_TABLET | Freq: Every day | ORAL | Status: DC
  Administered 2021-04-24 – 2021-04-27 (×4): 1 via ORAL
  Filled 2021-04-24 (×4): qty 1

## 2021-04-24 MED ORDER — ENSURE ENLIVE PO LIQD
1.0000 | Freq: Three times a day (TID) | ORAL | Status: DC
  Administered 2021-04-24 – 2021-04-26 (×3): 237 mL via ORAL

## 2021-04-24 NOTE — Progress Notes (Signed)
SATURATION QUALIFICATIONS: (This note is used to comply with regulatory documentation for home oxygen)  Patient Saturations on Room Air at Rest = 85%  Patient Saturations on 2 Liters at Room Air at rest =  100%  Patient Saturations on 2 Liters of oxygen while Ambulating = 86%

## 2021-04-24 NOTE — Evaluation (Signed)
Occupational Therapy Evaluation Patient Details Name: Linda Hart MRN: [id number] DOB: 1925-06-19 Today's Date: 04/24/2021    History of Present Illness presented to ER secondary to chest pain, LE edema; admitted for management of acute R LE DVT (managed with heparin), increased troponin (demand per chart).   Clinical Impression   Patient presenting with decreased I in self care, balance, functional mobility/transfer, endurance, and safety awareness. Patient lives at an assisted living facility [facility]. Pt uses RW for ambulation per her daughter in room. Pt ambulates 50' to dining room for meals. They assist her with medications. She performs her own ADLs with occasional min A  Patient currently functioning at min- mod A and fatigues quickly this session. Pt unable to ambulate to bathroom secondary to fatigue and transfers to [facility] instead. Pt needing assist with clothing management, transfer, and balance.  Patient will benefit from acute OT to increase overall independence in the areas of ADLs, functional mobility, and safety awareness in order to safely discharge to next venue of care.     Follow Up Recommendations  SNF;Supervision/Assistance - 24 hour    Equipment Recommendations  Other (comment) (defer to next venue of care)       Precautions / Restrictions Precautions Precautions: Fall Restrictions Weight Bearing Restrictions: No      Mobility Bed Mobility Overal bed mobility: Needs Assistance Bed Mobility: Supine to Sit;Sit to Supine     Supine to sit: Min assist Sit to supine: Min assist   General bed mobility comments: min A for trunk support and min cuing for technique    Transfers Overall transfer level: Needs assistance Equipment used: Rolling walker (2 wheeled);1 person hand held assist Transfers: Sit to/from Stand Sit to Stand: Min assist;Mod assist              Balance Overall balance assessment: Needs assistance Sitting-balance support: No upper  extremity supported;Feet supported Sitting balance-Leahy Scale: Good     Standing balance support: Bilateral upper extremity supported Standing balance-Leahy Scale: Fair                             ADL either performed or assessed with clinical judgement   ADL Overall ADL's : Needs assistance/impaired                         Toilet Transfer: Minimal assistance;BSC;Ambulation   Toileting- Clothing Manipulation and Hygiene: Moderate assistance;Sit to/from stand Toileting - Clothing Manipulation Details (indicate cue type and reason): assist with clothing management and balance while pt performs hygiene.     Functional mobility during ADLs: Minimal assistance;Cueing for safety;Cueing for sequencing       Vision Baseline Vision/History: Wears glasses Wears Glasses: At all times Patient Visual Report: No change from baseline              Pertinent Vitals/Pain Pain Assessment: No/denies pain     Hand Dominance Right   Extremity/Trunk Assessment Upper Extremity Assessment Upper Extremity Assessment: Generalized weakness   Lower Extremity Assessment Lower Extremity Assessment: Generalized weakness       Communication Communication Communication: HOH   Cognition Arousal/Alertness: Lethargic Behavior During Therapy: WFL for tasks assessed/performed Overall Cognitive Status: Impaired/Different from baseline                                 General Comments: Pt is oriented to self and is  cooperative. She falls asleep multiple times during session needing cuing for redirection/attention. She has difficulty recalling new information.                    Home Living Family/patient expects to be discharged to:: Assisted living                             Home Equipment: Walker - standard   Additional Comments: Ambulates to/from community room for meals (approx 50' from room)      Prior Functioning/Environment Level  of Independence: Independent with assistive device(s)        Comments: Mod indep with standard walker for ADLs, household mobilization; denies fall history; no home O2.        OT Problem List: Decreased strength;Decreased knowledge of use of DME or AE;Decreased activity tolerance;Cardiopulmonary status limiting activity;Decreased cognition;Impaired balance (sitting and/or standing);Decreased safety awareness      OT Treatment/Interventions: Self-care/ADL training;Manual therapy;Therapeutic exercise;Patient/family education;Modalities;Neuromuscular education;Balance training;Energy conservation;Therapeutic activities;DME and/or AE instruction;Cognitive remediation/compensation    OT Goals(Current goals can be found in the care plan section) Acute Rehab OT Goals Patient Stated Goal: per daughter, to go to rehab before returning to ALF OT Goal Formulation: With family Time For Goal Achievement: 05/08/21 Potential to Achieve Goals: Good  OT Frequency: Min 2X/week   Barriers to D/C:    none known at this time          AM-PAC OT "6 Clicks" Daily Activity     Outcome Measure Help from another person eating meals?: A Little Help from another person taking care of personal grooming?: A Little Help from another person toileting, which includes using toliet, bedpan, or urinal?: A Lot Help from another person bathing (including washing, rinsing, drying)?: A Lot Help from another person to put on and taking off regular upper body clothing?: A Little Help from another person to put on and taking off regular lower body clothing?: A Lot 6 Click Score: 15   End of Session Equipment Utilized During Treatment: Oxygen Nurse Communication: Mobility status  Activity Tolerance: Patient limited by fatigue Patient left: in bed;with call bell/phone within reach;with bed alarm set;with family/visitor present  OT Visit Diagnosis: Unsteadiness on feet (R26.81);Muscle weakness (generalized) (M62.81)                 Time: 6045-4098 OT Time Calculation (min): 26 min Charges:  OT General Charges $OT Visit: 1 Visit OT Evaluation $OT Eval Moderate Complexity: 1 Mod OT Treatments $Self Care/Home Management : 8-22 mins  Darleen Crocker, MS, OTR/L , CBIS ascom [phone]  04/24/21, 12:53 PM

## 2021-04-24 NOTE — Progress Notes (Addendum)
Patient still experiencing severe acid reflux after eating small amount of food.  Patient belching with some GI discomfort.   Difficult to determine issue due to confusion.    Patient is on IV Protonix and she refused IV Zofran for poss.GI upset   Paged MD to let know about reflux.  MD ordered Tums   Per daughter- patient has had issues with belching after eating.  It has worsened this admit.  States patient does not want to eat because of it.

## 2021-04-24 NOTE — Progress Notes (Signed)
On assessment- patient reported being SOB.  O2 sats on 2L still at 100%.  Paged MD  MD ordered chest xray and BNP.    Repositioned patient, gave pain medication and zofran for  GI upset and back pain.

## 2021-04-24 NOTE — Progress Notes (Signed)
Initial Nutrition Assessment  DOCUMENTATION CODES:  Severe malnutrition in context of chronic illness, Underweight  INTERVENTION:  Recommend liberalizing diet to regular.  Add Ensure Enlive po TID, each supplement provides 350 kcal and 20 grams of protein.  Add Magic cup TID with meals, each supplement provides 290 kcal and 9 grams of protein.  Add MVI with minerals daily.  NUTRITION DIAGNOSIS:  Severe Malnutrition related to chronic illness as evidenced by severe fat depletion, severe muscle depletion.  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  Of note, pt is hard of hearing. Spoke with pt at bedside briefly. She was very tired and did not interact much with RD. She reports eating well at home, but did no elaborate on what she was eating.  She denies any weight changes recently. Per Epic, pt's weight has remained fair consistent for the past 2 years.  On exam, pt has severe depletions at every site checked on her body. Of note, pt has moderate edema in BLE, which may be masking loss in her legs.  Given above information, pt is severely malnourished in the chronic setting. RD suspects that pt has been malnourished for quite sometime.  Recommend adding Ensure Enlive TID, Magic Cup TID, and MVI with minerals daily. Also recommend liberalizing diet to regular to promote more choices on the menu and given pt's age - secure chatted MD regarding this.  Medications: reviewed; Vitamin C, EE every other day, Synthroid, Protonix, spironolactone, Vitamin B12, Zofran PRN (given once today)  Labs: reviewed; serum Ca 8.1  NUTRITION - FOCUSED PHYSICAL  EXAM: Flowsheet Row Most Recent Value  Orbital Region Severe depletion  Upper Arm Region Severe depletion  Thoracic and Lumbar Region Severe depletion  Buccal Region Severe depletion  Temple Region Severe depletion  Clavicle Bone Region Severe depletion  Clavicle and Acromion Bone Region Severe depletion  Scapular Bone Region Severe depletion  Dorsal Hand Severe depletion  Patellar Region Moderate depletion  Anterior Thigh Region Moderate depletion  Posterior Calf Region Moderate depletion  Edema (RD Assessment) Moderate  [BLE]  Hair Reviewed  Eyes Reviewed  Mouth Reviewed  Skin Reviewed  Nails Reviewed   Diet Order:   Diet Order             Diet Heart Room service appropriate? Yes; Fluid consistency: Thin  Diet effective now                  EDUCATION NEEDS:  Education needs have been addressed  Skin:  Skin Assessment: Reviewed RN Assessment (Ecchymosis, dry skin)  Last BM:  04/22/21  Height:  Ht Readings from Last 1 Encounters:  04/21/21 5\' 4"  (1.626 m)   Weight:  Wt Readings from Last 1 Encounters:  04/24/21 40.8 kg   Ideal Body Weight:  54.5 kg  BMI:  Body mass index is 15.44 kg/m.  Estimated Nutritional Needs:  Kcal:  1350-1550 Protein:  60-75 grams Fluid:  >1.35 L  Derrel Nip, RD, LDN (she/her/hers) Registered Dietitian I After-Hours/Weekend Pager # in [place]

## 2021-04-24 NOTE — Progress Notes (Signed)
PROGRESS NOTE    Linda Hart  OAC:[id number] DOB: 1925-10-04 DOA: 04/21/2021 PCP: Kirk Ruths, MD    Brief Narrative:  Linda Hart is a 87 y.o. female with medical history significant for coronary artery disease with stent placement, chronic systolic and diastolic congestive heart failure with EF 40-45% in 2016, COPD, GERD, who presents to the emergency department on 04/21/2021 with shortness of breath.  Patient reports she is tired and she is not giving very much history at all, so history is obtained from the emergency department physician and the medical record.  Per report patient had shortness of breath and chest pain that began on 04/20/2021; at the time of admission patient denies that she had any chest pain, but does admit to shortness of breath.  On 04/20/2021, the patient was given nitroglycerin for her chest pain.  On 04/21/2021, the patient was noted to be short of breath and she was also noted to have swelling in her left lower extremity.  Symptoms were alleviated by nothing and exacerbated by nothing.  Shortness of breath is constant.  Associated symptoms: Per report patient had chest pain on the day before admission.  She has left lower leg swelling. Ultrasound of the left lower extremity revealed a nonocclusive left leg DVT.  CTA not done due to underlying kidney function.  VQ scan was done which was negative.  Currently on heparin drip  6/25 daugther at bedside reports mom more sob this am.Daughter 6/26-from respiratory status doing well. Non tachypnic this am. Lying flat in bed.  6/27 c/o sob this am. No cp. O2 sat on 2 L stable.   Consultants:  cardiology  Procedures:   Antimicrobials:      Subjective: No dizziness. No bleed.   Objective: Vitals:   04/24/21 0118 04/24/21 0407 04/24/21 0600 04/24/21 0830  BP:  117/61  120/70  Pulse:  79  88  Resp:  18  (!) 23  Temp:  97.8 F (36.6 C)  97.7 F (36.5 C)  TempSrc:    Oral  SpO2:  100%  100%  Weight:  39.8 kg  40.8 kg   Height:       No intake or output data in the 24 hours ending 04/24/21 0854  Filed Weights   04/23/21 0630 04/24/21 0118 04/24/21 0600  Weight: 44.2 kg 39.8 kg 40.8 kg    Examination: Nad, calm, tired  Cta with decrease bs at bases Regular s1/s2 no gallop Soft benign +bs No edema Aaxox4 Mood and affect appropriate in current setting   Data Reviewed: I have personally reviewed following labs and imaging studies  CBC: Recent Labs  Lab 04/21/21 1417 04/22/21 0331 04/23/21 0408 04/24/21 0619  WBC 18.6* 19.1* 13.2* 11.7*  HGB 12.9 11.7* 10.8* 10.2*  HCT 39.2 36.0 33.0* 31.4*  MCV 96.8 97.0 96.8 97.8  PLT 231 207 200 010   Basic Metabolic Panel: Recent Labs  Lab 04/21/21 1417 04/22/21 0331 04/23/21 0408 04/24/21 0620  NA 133* 135 136 137  K 5.1 4.9 4.4 4.6  CL 101 103 101 104  CO2 24 25 29 27   GLUCOSE 211* 148* 106* 88  BUN 40* 42* 49* 52*  CREATININE 1.50* 1.45* 1.58* 1.74*  CALCIUM 9.1 8.6* 8.3* 8.1*   GFR: Estimated Creatinine Clearance: 13 mL/min (A) (by C-G formula based on SCr of 1.74 mg/dL (H)). Liver Function Tests: Recent Labs  Lab 04/21/21 1417  AST 28  ALT 16  ALKPHOS 83  BILITOT 1.6*  PROT 6.6  ALBUMIN 3.4*   No results for input(s): LIPASE, AMYLASE in the last 168 hours. No results for input(s): AMMONIA in the last 168 hours. Coagulation Profile: Recent Labs  Lab 04/21/21 1855  INR 1.1   Cardiac Enzymes: No results for input(s): CKTOTAL, CKMB, CKMBINDEX, TROPONINI in the last 168 hours. BNP (last 3 results) No results for input(s): PROBNP in the last 8760 hours. HbA1C: No results for input(s): HGBA1C in the last 72 hours. CBG: No results for input(s): GLUCAP in the last 168 hours. Lipid Profile: Recent Labs    04/22/21 0331  CHOL 151  HDL 71  LDLCALC 74  TRIG 29  CHOLHDL 2.1   Thyroid Function Tests: No results for input(s): TSH, T4TOTAL, FREET4, T3FREE, THYROIDAB in the last 72 hours. Anemia  Panel: No results for input(s): VITAMINB12, FOLATE, FERRITIN, TIBC, IRON, RETICCTPCT in the last 72 hours. Sepsis Labs: No results for input(s): PROCALCITON, LATICACIDVEN in the last 168 hours.  Recent Results (from the past 240 hour(s))  Resp Panel by RT-PCR (Flu A&B, Covid) Nasopharyngeal Swab     Status: None   Collection Time: 04/21/21  3:03 PM   Specimen: Nasopharyngeal Swab; Nasopharyngeal(NP) swabs in vial transport medium  Result Value Ref Range Status   SARS Coronavirus 2 by RT PCR NEGATIVE NEGATIVE Final    Comment: (NOTE) SARS-CoV-2 target nucleic acids are NOT DETECTED.  The SARS-CoV-2 RNA is generally detectable in upper respiratory specimens during the acute phase of infection. The lowest concentration of SARS-CoV-2 viral copies this assay can detect is 138 copies/mL. A negative result does not preclude SARS-Cov-2 infection and should not be used as the sole basis for treatment or other patient management decisions. A negative result may occur with  improper specimen collection/handling, submission of specimen other than nasopharyngeal swab, presence of viral mutation(s) within the areas targeted by this assay, and inadequate number of viral copies(<138 copies/mL). A negative result must be combined with clinical observations, patient history, and epidemiological information. The expected result is Negative.  Fact Sheet for Patients:  EntrepreneurPulse.com.au  Fact Sheet for Healthcare Providers:  IncredibleEmployment.be  This test is no t yet approved or cleared by the [place] FDA and  has been authorized for detection and/or diagnosis of SARS-CoV-2 by FDA under an Emergency Use Authorization (EUA). This EUA will remain  in effect (meaning this test can be used) for the duration of the COVID-19 declaration under Section 564(b)(1) of the Act, 21 U.S.C.section 360bbb-3(b)(1), unless the authorization is terminated  or  revoked sooner.       Influenza A by PCR NEGATIVE NEGATIVE Final   Influenza B by PCR NEGATIVE NEGATIVE Final    Comment: (NOTE) The Xpert Xpress SARS-CoV-2/FLU/RSV plus assay is intended as an aid in the diagnosis of influenza from Nasopharyngeal swab specimens and should not be used as a sole basis for treatment. Nasal washings and aspirates are unacceptable for Xpert Xpress SARS-CoV-2/FLU/RSV testing.  Fact Sheet for Patients: EntrepreneurPulse.com.au  Fact Sheet for Healthcare Providers: IncredibleEmployment.be  This test is not yet approved or cleared by the [place] FDA and has been authorized for detection and/or diagnosis of SARS-CoV-2 by FDA under an Emergency Use Authorization (EUA). This EUA will remain in effect (meaning this test can be used) for the duration of the COVID-19 declaration under Section 564(b)(1) of the Act, 21 U.S.C. section 360bbb-3(b)(1), unless the authorization is terminated or revoked.  Performed at [facility], [address], [place], [place] [address]   MRSA Next Gen  by PCR, Nasal     Status: None   Collection Time: 04/22/21  2:00 PM  Result Value Ref Range Status   MRSA by PCR Next Gen NOT DETECTED NOT DETECTED Final    Comment: (NOTE) The GeneXpert MRSA Assay (FDA approved for NASAL specimens only), is one component of a comprehensive MRSA colonization surveillance program. It is not intended to diagnose MRSA infection nor to guide or monitor treatment for MRSA infections. Test performance is not FDA approved in patients less than 62 years old. Performed at [facility], [address] [place], [place] [address]          Radiology Studies: NM Pulmonary Perfusion  Result Date: 04/22/2021 CLINICAL DATA:  PE suspected EXAM: NUCLEAR MEDICINE PERFUSION LUNG SCAN TECHNIQUE: Perfusion images were obtained in multiple projections after intravenous injection of  radiopharmaceutical. Ventilation scans intentionally deferred if perfusion scan and chest x-ray adequate for interpretation during COVID 19 epidemic. RADIOPHARMACEUTICALS:  3.89 mCi Tc-89m MAA IV COMPARISON:  Chest radiograph, 04/21/2021 FINDINGS: Normal, homogeneous perfusion of the lungs. No suspicious filling defect. IMPRESSION: Very low probability examination for pulmonary embolism by modified perfusion only PIOPED criteria (PE absent). Electronically Signed   By: Eddie Candle M.D.   On: 04/22/2021 11:51   ECHOCARDIOGRAM COMPLETE  Result Date: 04/22/2021    ECHOCARDIOGRAM REPORT   Patient Name:   Linda Hart Date of Exam: 04/22/2021 Medical Rec #:  [id number]      Height:       64.0 in Accession #:    [id number]     Weight:       83.1 lb Date of Birth:  1925-07-08     BSA:          1.347 m Patient Age:    79 years       BP:           176/89 mmHg Patient Gender: F              HR:           100 bpm. Exam Location:  [facility] Procedure: 2D Echo, Color Doppler and Cardiac Doppler Indications:     Pulmonary Embolus I26.09  History:         Patient has prior history of Echocardiogram examinations. CAD;                  Arrythmias:Atrial Fibrillation.  Sonographer:     Alyse Low Roar Referring Phys:  [id number] Tacey Ruiz Diagnosing Phys: Kate Sable MD IMPRESSIONS  1. Left ventricular ejection fraction, by estimation, is 35 to 40%. The left ventricle has moderately decreased function. The left ventricle demonstrates global hypokinesis. Left ventricular diastolic parameters are consistent with Grade II diastolic dysfunction (pseudonormalization).  2. Right ventricular systolic function is mildly reduced. The right ventricular size is normal. There is normal pulmonary artery systolic pressure.  3. Left atrial size was mild to moderately dilated.  4. The mitral valve is grossly normal. Mild to moderate mitral valve regurgitation.  5. The aortic valve is calcified. Aortic valve regurgitation is mild. Mild aortic  valve stenosis.  6. The inferior vena cava is normal in size with <50% respiratory variability, suggesting right atrial pressure of 8 mmHg. FINDINGS  Left Ventricle: Left ventricular ejection fraction, by estimation, is 35 to 40%. The left ventricle has moderately decreased function. The left ventricle demonstrates global hypokinesis. The left ventricular internal cavity size was normal in size. There is no left ventricular hypertrophy. Left ventricular diastolic parameters are  consistent with Grade II diastolic dysfunction (pseudonormalization). Right Ventricle: The right ventricular size is normal. No increase in right ventricular wall thickness. Right ventricular systolic function is mildly reduced. There is normal pulmonary artery systolic pressure. The tricuspid regurgitant velocity is 2.08 m/s, and with an assumed right atrial pressure of 8 mmHg, the estimated right ventricular systolic pressure is 42.6 mmHg. Left Atrium: Left atrial size was mild to moderately dilated. Right Atrium: Right atrial size was normal in size. Pericardium: There is no evidence of pericardial effusion. Mitral Valve: The mitral valve is grossly normal. Mild to moderate mitral valve regurgitation. Tricuspid Valve: The tricuspid valve is normal in structure. Tricuspid valve regurgitation is mild. Aortic Valve: The aortic valve is calcified. Aortic valve regurgitation is mild. Aortic regurgitation PHT measures 329 msec. Mild aortic stenosis is present. Aortic valve mean gradient measures 5.0 mmHg. Aortic valve peak gradient measures 9.5 mmHg. Aortic valve area, by VTI measures 1.65 cm. Pulmonic Valve: The pulmonic valve was not well visualized. Pulmonic valve regurgitation is not visualized. Aorta: The aortic root is normal in size and structure. Venous: The inferior vena cava is normal in size with less than 50% respiratory variability, suggesting right atrial pressure of 8 mmHg. IAS/Shunts: No atrial level shunt detected by color flow  Doppler.  LEFT VENTRICLE PLAX 2D LVIDd:         4.60 cm     Diastology LVIDs:         3.80 cm     LV e' medial:    3.15 cm/s LV PW:         0.90 cm     LV E/e' medial:  20.7 LV IVS:        0.90 cm     LV e' lateral:   6.31 cm/s LVOT diam:     2.10 cm     LV E/e' lateral: 10.3 LV SV:         50 LV SV Index:   37 LVOT Area:     3.46 cm  LV Volumes (MOD) LV vol d, MOD A2C: 79.0 ml LV vol d, MOD A4C: 66.7 ml LV vol s, MOD A2C: 50.1 ml LV vol s, MOD A4C: 45.5 ml LV SV MOD A2C:     28.9 ml LV SV MOD A4C:     66.7 ml LV SV MOD BP:      28.0 ml RIGHT VENTRICLE RV Mid diam:    2.90 cm RV S prime:     7.07 cm/s TAPSE (M-mode): 1.3 cm LEFT ATRIUM             Index       RIGHT ATRIUM           Index LA diam:        4.30 cm 3.19 cm/m  RA Area:     13.20 cm LA Vol (A2C):   70.9 ml 52.63 ml/m RA Volume:   32.60 ml  24.20 ml/m LA Vol (A4C):   52.7 ml 39.12 ml/m LA Biplane Vol: 61.1 ml 45.36 ml/m  AORTIC VALVE                    PULMONIC VALVE AV Area (Vmax):    1.59 cm     PV Vmax:        0.66 m/s AV Area (Vmean):   1.54 cm     PV Peak grad:   1.8 mmHg AV Area (VTI):     1.65 cm  RVOT Peak grad: 0 mmHg AV Vmax:           154.00 cm/s AV Vmean:          103.000 cm/s AV VTI:            0.301 m AV Peak Grad:      9.5 mmHg AV Mean Grad:      5.0 mmHg LVOT Vmax:         70.50 cm/s LVOT Vmean:        45.700 cm/s LVOT VTI:          0.143 m LVOT/AV VTI ratio: 0.48 AI PHT:            329 msec  AORTA Ao Root diam: 2.60 cm MITRAL VALVE               TRICUSPID VALVE MV Area (PHT): 4.17 cm    TR Peak grad:   17.3 mmHg MV Decel Time: 182 msec    TR Vmax:        208.00 cm/s MV E velocity: 65.10 cm/s MV A velocity: 69.40 cm/s  SHUNTS MV E/A ratio:  0.94        Systemic VTI:  0.14 m MV A Prime:    7.2 cm/s    Systemic Diam: 2.10 cm Kate Sable MD Electronically signed by Kate Sable MD Signature Date/Time: 04/22/2021/6:10:27 PM    Final         Scheduled Meds:  apixaban  10 mg Oral BID   Followed by   Derrill Memo ON  05/01/2021] apixaban  5 mg Oral BID   vitamin C  1,000 mg Oral Daily   aspirin EC  81 mg Oral Daily   atorvastatin  40 mg Oral Daily   carvedilol  3.125 mg Oral BID   feeding supplement  1 Bottle Oral QODAY   levothyroxine  25 mcg Oral Daily   pantoprazole (PROTONIX) IV  40 mg Intravenous Q12H   spironolactone  12.5 mg Oral Daily   vitamin B-12  1,000 mcg Oral Daily   Continuous Infusions:    Assessment & Plan:   Principal Problem:   Acute deep vein thrombosis (DVT) of left lower extremity (HCC) Active Problems:   CAD in native artery   Chronic combined systolic and diastolic heart failure (HCC)   GERD without esophagitis   COPD with asthma (HCC)   Acute pulmonary embolism (HCC)   Hypoxia   AKI (acute kidney injury) ([place])   Acute deep vein thrombosis (DVT) of left lower extremity (HCC) Vq scan negative. Unable to do cta due to underlying CKD. Spoke to daughter if pt has hx of falls, she said one fall where had hip fx few years ago but no recent falls. Pt walks with walker and has no issues . D/w daughter about a/c risk including brain hemorrhage she verbalizes an understanding. Continue heparin gtt x48hrs, then will switch to po Eliquis if hemodynamics stable.  6/26- again I verified with daughter-patient has had frequent falls as he was noted in cardiology note.  Daughter denies that patient has had multiple falls.  In 2019 had 1 mechanical fall when she slipped and broke her hip.  Since then she has had no issues.  We had this discussion the importance of knowing this for treatment decisions placing IVC filter versus anticoagulation and complications of anticoagulation.  After much consensus a discussion it appears patient does not have multiple falls and she is okay with proceeding with anticoagulation. 6/27 transitioning  from heparin gtt to Elqiuis bid tx for DVT.  Will need to f/u with pcp for further outpt mx on discharge    Acute hypoxic respiratory failure Likely due to  atelectasis and acute CHF Received lasix x1 Obtained cxr and bnp this am.  Cxr with pl eff and bnp less than before. Encouraged pt to use IS Supplemental o2 to keep 02 sat >92% Likely will need home 02 on d/c      AKI on CKD stage IIIb Baseline 1.29-1.4 Renal function increased mildly today Dc aldactone for now Avoid nephrotoxic meds Monitor level   CAD in native artery Elevated Troponins: Cards was consulted likely elevated troponins due to demand ischemia Echo with EF 35 to 40% with global hypokinesis.  And grade 2 diastolic dysfunction 2/17-VGV currently. Will need f/u with cards as outpt     GERD without esophagitis Continue PPI     COPD with asthma ([place]) Continue home mdi Will start albuterol nebs prn      DVT prophylaxis: heparin>>>transition to eliquis Code Status:full  Family Communication: daughter at bedside Disposition Plan:  Status is: inpatient  Patients status is inpatient because: Inpatient level of care appropriate due to severity of illness  Dispo: The patient is from: Home              Anticipated d/c is to: Home              Patient currently is not medically stable to d/c.   Difficult to place patient No            LOS: 2 days   Time spent: 35 min with >50% on coc    Nolberto Hanlon, MD [facility] Pager [phone]  If 7PM-7AM, please contact night-coverage 04/24/2021, 8:54 AM

## 2021-04-25 LAB — URINALYSIS, COMPLETE (UACMP) WITH MICROSCOPIC
Bilirubin Urine: NEGATIVE
Glucose, UA: NEGATIVE mg/dL
Hgb urine dipstick: NEGATIVE
Ketones, ur: NEGATIVE mg/dL
Nitrite: NEGATIVE
Protein, ur: NEGATIVE mg/dL
Specific Gravity, Urine: 1.025 (ref 1.005–1.030)
pH: 5 (ref 5.0–8.0)

## 2021-04-25 LAB — BASIC METABOLIC PANEL WITH GFR
Anion gap: 12 (ref 5–15)
BUN: 51 mg/dL — ABNORMAL HIGH (ref 8–23)
CO2: 20 mmol/L — ABNORMAL LOW (ref 22–32)
Calcium: 8.1 mg/dL — ABNORMAL LOW (ref 8.9–10.3)
Chloride: 108 mmol/L (ref 98–111)
Creatinine, Ser: 1.76 mg/dL — ABNORMAL HIGH (ref 0.44–1.00)
GFR, Estimated: 27 mL/min — ABNORMAL LOW (ref >60)
Glucose, Bld: 98 mg/dL (ref 70–99)
Potassium: 5.5 mmol/L — ABNORMAL HIGH (ref 3.5–5.1)
Sodium: 140 mmol/L (ref 135–145)

## 2021-04-25 LAB — LACTATE DEHYDROGENASE: LDH: 149 U/L (ref 98–192)

## 2021-04-25 LAB — FERRITIN: Ferritin: 298 ng/mL (ref 11–307)

## 2021-04-25 LAB — FIBRINOGEN: Fibrinogen: 378 mg/dL (ref 210–475)

## 2021-04-25 LAB — TROPONIN I (HIGH SENSITIVITY)
Troponin I (High Sensitivity): 88 ng/L — ABNORMAL HIGH (ref <18)
Troponin I (High Sensitivity): 78 ng/L — ABNORMAL HIGH (ref <18)

## 2021-04-25 LAB — D-DIMER, QUANTITATIVE: D-Dimer, Quant: 1.18 ug/mL-FEU — ABNORMAL HIGH (ref 0.00–0.50)

## 2021-04-25 LAB — MRSA NEXT GEN BY PCR, NASAL: MRSA by PCR Next Gen: NOT DETECTED

## 2021-04-25 LAB — C-REACTIVE PROTEIN: CRP: 7.9 mg/dL — ABNORMAL HIGH (ref <1.0)

## 2021-04-25 LAB — PROCALCITONIN: Procalcitonin: 0.31 ng/mL

## 2021-04-25 MED ORDER — SODIUM CHLORIDE 0.9 % IV SOLN: 2.0000 g | Freq: Two times a day (BID) | INTRAVENOUS | Status: DC

## 2021-04-25 MED ORDER — GUAIFENESIN-DM 100-10 MG/5ML PO SYRP: 10.0000 mL | ORAL_SOLUTION | ORAL | Status: DC | PRN

## 2021-04-25 MED ORDER — SODIUM CHLORIDE 0.9 % IV SOLN
2.0000 g | INTRAVENOUS | Status: DC
  Administered 2021-04-25: 2 g via INTRAVENOUS
  Filled 2021-04-25 (×2): qty 2

## 2021-04-25 MED ORDER — VITAMIN D 25 MCG (1000 UNIT) PO TABS
1000.0000 [IU] | ORAL_TABLET | Freq: Every day | ORAL | Status: DC
  Administered 2021-04-25 – 2021-04-27 (×3): 1000 [IU] via ORAL
  Filled 2021-04-25 (×3): qty 1

## 2021-04-25 MED ORDER — SODIUM ZIRCONIUM CYCLOSILICATE 5 G PO PACK
5.0000 g | PACK | Freq: Once | ORAL | Status: AC
  Administered 2021-04-25: 5 g via ORAL
  Filled 2021-04-25: qty 1

## 2021-04-25 MED ORDER — VANCOMYCIN VARIABLE DOSE PER UNSTABLE RENAL FUNCTION (PHARMACIST DOSING): Status: DC

## 2021-04-25 MED ORDER — ZINC SULFATE 220 (50 ZN) MG PO CAPS
220.0000 mg | ORAL_CAPSULE | Freq: Every day | ORAL | Status: DC
  Administered 2021-04-25 – 2021-04-27 (×3): 220 mg via ORAL
  Filled 2021-04-25 (×3): qty 1

## 2021-04-25 MED ORDER — SODIUM CHLORIDE 0.9 % IV SOLN
200.0000 mg | Freq: Once | INTRAVENOUS | Status: AC
  Administered 2021-04-25: 200 mg via INTRAVENOUS
  Filled 2021-04-25: qty 40

## 2021-04-25 MED ORDER — VANCOMYCIN HCL 1000 MG/200ML IV SOLN
1000.0000 mg | Freq: Once | INTRAVENOUS | Status: DC
  Filled 2021-04-25: qty 200

## 2021-04-25 MED ORDER — VANCOMYCIN HCL 1000 MG/200ML IV SOLN: 1000.0000 mg | Freq: Two times a day (BID) | INTRAVENOUS | Status: DC

## 2021-04-25 MED ORDER — HYDROCOD POLST-CPM POLST ER 10-8 MG/5ML PO SUER: 5.0000 mL | Freq: Two times a day (BID) | ORAL | Status: DC | PRN

## 2021-04-25 MED ORDER — SODIUM CHLORIDE 0.9 % IV SOLN
100.0000 mg | Freq: Every day | INTRAVENOUS | Status: AC
  Administered 2021-04-26 – 2021-04-27 (×2): 100 mg via INTRAVENOUS
  Filled 2021-04-25 (×2): qty 100
  Filled 2021-04-25: qty 20

## 2021-04-25 MED ORDER — CARVEDILOL 6.25 MG PO TABS
6.2500 mg | ORAL_TABLET | Freq: Two times a day (BID) | ORAL | Status: DC
  Administered 2021-04-25 – 2021-04-27 (×4): 6.25 mg via ORAL
  Filled 2021-04-25 (×4): qty 1

## 2021-04-25 MED ORDER — VANCOMYCIN HCL IN DEXTROSE 1-5 GM/200ML-% IV SOLN
1000.0000 mg | Freq: Once | INTRAVENOUS | Status: AC
  Administered 2021-04-25: 1000 mg via INTRAVENOUS
  Filled 2021-04-25: qty 200

## 2021-04-25 NOTE — Progress Notes (Signed)
Remdesivir - Pharmacy Brief Note   O:  CXR: "Slight increase in the basilar atelectasis with small effusion."  SpO2: 100% on 2 L/min via [place]   A/P:  Remdesivir 200 mg IVPB once followed by 100 mg IVPB daily x 4 days.   Renda Rolls, PharmD, [facility] 04/25/2021 1:03 AM

## 2021-04-25 NOTE — Progress Notes (Signed)
PROGRESS NOTE    Linda Hart  CWC:[id number] DOB: 1925-08-11 DOA: 04/21/2021 PCP: Kirk Ruths, MD    Brief Narrative:  Linda Hart is a 87 y.o. female with medical history significant for coronary artery disease with stent placement, chronic systolic and diastolic congestive heart failure with EF 40-45% in 2016, COPD, GERD, who presents to the emergency department on 04/21/2021 with shortness of breath.  Patient reports she is tired and she is not giving very much history at all, so history is obtained from the emergency department physician and the medical record.  Per report patient had shortness of breath and chest pain that began on 04/20/2021; at the time of admission patient denies that she had any chest pain, but does admit to shortness of breath.  On 04/20/2021, the patient was given nitroglycerin for her chest pain.  On 04/21/2021, the patient was noted to be short of breath and she was also noted to have swelling in her left lower extremity.  Symptoms were alleviated by nothing and exacerbated by nothing.  Shortness of breath is constant.  Associated symptoms: Per report patient had chest pain on the day before admission.  She has left lower leg swelling. Ultrasound of the left lower extremity revealed a nonocclusive left leg DVT.  CTA not done due to underlying kidney function.  VQ scan was done which was negative.    Transitioned from heparin gtt to po a/c. Was c/o of sob, diuresed. Still some sob. Got covid fordischarge and was positive. Started on IV remdesivir.    Consultants:  cardiology  Procedures:   Antimicrobials:      Subjective: This am reports sob is improving. No cp.   Objective: Vitals:   04/25/21 1238 04/25/21 1251 04/25/21 1401 04/25/21 1511  BP: (!) 193/64 (!) 190/67 (!) 150/96 (!) 150/45  Pulse: 85 85 75 70  Resp: 18  17 19   Temp: (!) 97.5 F (36.4 C)  (!) 97.5 F (36.4 C) 97.8 F (36.6 C)  TempSrc:   Oral Oral  SpO2: 100%  100% 99%   Weight:      Height:        Intake/Output Summary (Last 24 hours) at 04/25/2021 1529 Last data filed at 04/25/2021 0943 Gross per 24 hour  Intake 530 ml  Output 150 ml  Net 380 ml    Filed Weights   04/24/21 0118 04/24/21 0600 04/25/21 0500  Weight: 39.8 kg 40.8 kg 40 kg    Examination: Nad, calm, tired  Cta with decrease bs at bases Regular s1/s2 no gallop Soft benign +bs No edema Aaxox4 Mood and affect appropriate in current setting   Data Reviewed: I have personally reviewed following labs and imaging studies  CBC: Recent Labs  Lab 04/21/21 1417 04/22/21 0331 04/23/21 0408 04/24/21 0619  WBC 18.6* 19.1* 13.2* 11.7*  HGB 12.9 11.7* 10.8* 10.2*  HCT 39.2 36.0 33.0* 31.4*  MCV 96.8 97.0 96.8 97.8  PLT 231 207 200 761   Basic Metabolic Panel: Recent Labs  Lab 04/21/21 1417 04/22/21 0331 04/23/21 0408 04/24/21 0620 04/25/21 0529  NA 133* 135 136 137 140  K 5.1 4.9 4.4 4.6 5.5*  CL 101 103 101 104 108  CO2 24 25 29 27  20*  GLUCOSE 211* 148* 106* 88 98  BUN 40* 42* 49* 52* 51*  CREATININE 1.50* 1.45* 1.58* 1.74* 1.76*  CALCIUM 9.1 8.6* 8.3* 8.1* 8.1*   GFR: Estimated Creatinine Clearance: 12.6 mL/min (A) (by C-G formula based on SCr of 1.76  mg/dL (H)). Liver Function Tests: Recent Labs  Lab 04/21/21 1417  AST 28  ALT 16  ALKPHOS 83  BILITOT 1.6*  PROT 6.6  ALBUMIN 3.4*   No results for input(s): LIPASE, AMYLASE in the last 168 hours. No results for input(s): AMMONIA in the last 168 hours. Coagulation Profile: Recent Labs  Lab 04/21/21 1855  INR 1.1   Cardiac Enzymes: No results for input(s): CKTOTAL, CKMB, CKMBINDEX, TROPONINI in the last 168 hours. BNP (last 3 results) No results for input(s): PROBNP in the last 8760 hours. HbA1C: No results for input(s): HGBA1C in the last 72 hours. CBG: No results for input(s): GLUCAP in the last 168 hours. Lipid Profile: No results for input(s): CHOL, HDL, LDLCALC, TRIG, CHOLHDL, LDLDIRECT in  the last 72 hours.  Thyroid Function Tests: No results for input(s): TSH, T4TOTAL, FREET4, T3FREE, THYROIDAB in the last 72 hours. Anemia Panel: Recent Labs    04/25/21 0129  FERRITIN 298   Sepsis Labs: Recent Labs  Lab 04/25/21 0129  PROCALCITON 0.31    Recent Results (from the past 240 hour(s))  Resp Panel by RT-PCR (Flu A&B, Covid) Nasopharyngeal Swab     Status: None   Collection Time: 04/21/21  3:03 PM   Specimen: Nasopharyngeal Swab; Nasopharyngeal(NP) swabs in vial transport medium  Result Value Ref Range Status   SARS Coronavirus 2 by RT PCR NEGATIVE NEGATIVE Final    Comment: (NOTE) SARS-CoV-2 target nucleic acids are NOT DETECTED.  The SARS-CoV-2 RNA is generally detectable in upper respiratory specimens during the acute phase of infection. The lowest concentration of SARS-CoV-2 viral copies this assay can detect is 138 copies/mL. A negative result does not preclude SARS-Cov-2 infection and should not be used as the sole basis for treatment or other patient management decisions. A negative result may occur with  improper specimen collection/handling, submission of specimen other than nasopharyngeal swab, presence of viral mutation(s) within the areas targeted by this assay, and inadequate number of viral copies(<138 copies/mL). A negative result must be combined with clinical observations, patient history, and epidemiological information. The expected result is Negative.  Fact Sheet for Patients:  EntrepreneurPulse.com.au  Fact Sheet for Healthcare Providers:  IncredibleEmployment.be  This test is no t yet approved or cleared by the [place] FDA and  has been authorized for detection and/or diagnosis of SARS-CoV-2 by FDA under an Emergency Use Authorization (EUA). This EUA will remain  in effect (meaning this test can be used) for the duration of the COVID-19 declaration under Section 564(b)(1) of the Act,  21 U.S.C.section 360bbb-3(b)(1), unless the authorization is terminated  or revoked sooner.       Influenza A by PCR NEGATIVE NEGATIVE Final   Influenza B by PCR NEGATIVE NEGATIVE Final    Comment: (NOTE) The Xpert Xpress SARS-CoV-2/FLU/RSV plus assay is intended as an aid in the diagnosis of influenza from Nasopharyngeal swab specimens and should not be used as a sole basis for treatment. Nasal washings and aspirates are unacceptable for Xpert Xpress SARS-CoV-2/FLU/RSV testing.  Fact Sheet for Patients: EntrepreneurPulse.com.au  Fact Sheet for Healthcare Providers: IncredibleEmployment.be  This test is not yet approved or cleared by the [place] FDA and has been authorized for detection and/or diagnosis of SARS-CoV-2 by FDA under an Emergency Use Authorization (EUA). This EUA will remain in effect (meaning this test can be used) for the duration of the COVID-19 declaration under Section 564(b)(1) of the Act, 21 U.S.C. section 360bbb-3(b)(1), unless the authorization is terminated or revoked.  Performed  at [facility], [address] [place], [place] [address]   MRSA Next Gen by PCR, Nasal     Status: None   Collection Time: 04/22/21  2:00 PM  Result Value Ref Range Status   MRSA by PCR Next Gen NOT DETECTED NOT DETECTED Final    Comment: (NOTE) The GeneXpert MRSA Assay (FDA approved for NASAL specimens only), is one component of a comprehensive MRSA colonization surveillance program. It is not intended to diagnose MRSA infection nor to guide or monitor treatment for MRSA infections. Test performance is not FDA approved in patients less than 15 years old. Performed at [facility], [address], [place] [address]   SARS CORONAVIRUS 2 (TAT 6-24 HRS) Nasopharyngeal Nasopharyngeal Swab     Status: Abnormal   Collection Time: 04/24/21 12:00 PM   Specimen: Nasopharyngeal Swab  Result Value Ref Range  Status   SARS Coronavirus 2 POSITIVE (A) NEGATIVE Final    Comment: (NOTE) SARS-CoV-2 target nucleic acids are DETECTED.  The SARS-CoV-2 RNA is generally detectable in upper and lower respiratory specimens during the acute phase of infection. Positive results are indicative of the presence of SARS-CoV-2 RNA. Clinical correlation with patient history and other diagnostic information is  necessary to determine patient infection status. Positive results do not rule out bacterial infection or co-infection with other viruses.  The expected result is Negative.  Fact Sheet for Patients: SugarRoll.be  Fact Sheet for Healthcare Providers: https://www.woods-mathews.com/  This test is not yet approved or cleared by the [place] FDA and  has been authorized for detection and/or diagnosis of SARS-CoV-2 by FDA under an Emergency Use Authorization (EUA). This EUA will remain  in effect (meaning this test can be used) for the duration of the COVID-19 declaration under Section 564(b)(1) of the Act, 21 U. S.C. section 360bbb-3(b)(1), unless the authorization is terminated or revoked sooner.   Performed at [facility], [place] [address], [place], [place] [address]   MRSA Next Gen by PCR, Nasal     Status: None   Collection Time: 04/25/21 11:33 AM   Specimen: Nasal Mucosa; Nasal Swab  Result Value Ref Range Status   MRSA by PCR Next Gen NOT DETECTED NOT DETECTED Final    Comment: (NOTE) The GeneXpert MRSA Assay (FDA approved for NASAL specimens only), is one component of a comprehensive MRSA colonization surveillance program. It is not intended to diagnose MRSA infection nor to guide or monitor treatment for MRSA infections. Test performance is not FDA approved in patients less than 19 years old. Performed at [facility], [address], [place],  [address]          Radiology Studies: [facility] Chest [place] 1 View  Result  Date: 04/24/2021 CLINICAL DATA:  Shortness of breath EXAM: PORTABLE CHEST 1 VIEW COMPARISON:  04/21/2021 FINDINGS: Cardiac shadow is stable. Small effusion is noted on the left. Mild left basilar atelectasis is noted progressed from the prior exam. No new focal infiltrate is seen. No bony abnormality is noted. IMPRESSION: Slight increase in the basilar atelectasis with small effusion. Electronically Signed   By: Inez Catalina M.D.   On: 04/24/2021 08:53        Scheduled Meds:  apixaban  10 mg Oral BID   Followed by   Derrill Memo ON 05/01/2021] apixaban  5 mg Oral BID   vitamin C  1,000 mg Oral Daily   atorvastatin  40 mg Oral Daily   calcium carbonate  1 tablet  Oral Daily   carvedilol  3.125 mg Oral BID   cholecalciferol  1,000 Units Oral Daily   feeding supplement  1 Bottle Oral TID BM   levothyroxine  25 mcg Oral Daily   multivitamin with minerals  1 tablet Oral Daily   pantoprazole  40 mg Oral Daily   vancomycin variable dose per unstable renal function (pharmacist dosing)   Does not apply See admin instructions   vitamin B-12  1,000 mcg Oral Daily   zinc sulfate  220 mg Oral Daily   Continuous Infusions:  ceFEPime (MAXIPIME) IV 2 g (04/25/21 1139)   [START ON 04/26/2021] remdesivir 100 mg in NS 100 mL       Assessment & Plan:   Principal Problem:   Acute deep vein thrombosis (DVT) of left lower extremity (HCC) Active Problems:   CAD in native artery   Chronic combined systolic and diastolic heart failure (HCC)   GERD without esophagitis   COPD with asthma ([place])   Acute pulmonary embolism ([place])   Hypoxia   AKI (acute kidney injury) ([place])   Protein-calorie malnutrition, severe   Acute deep vein thrombosis (DVT) of left lower extremity ([place]) Vq scan negative. Unable to do cta due to underlying CKD. Spoke to daughter if pt has hx of falls, she said one fall where had hip fx few years ago but no recent falls. Pt walks with walker and has no issues . D/w daughter about a/c risk  including brain hemorrhage she verbalizes an understanding. Continue heparin gtt x48hrs, then will switch to po Eliquis if hemodynamics stable.  6/26- again I verified with daughter-patient has had frequent falls as he was noted in cardiology note.  Daughter denies that patient has had multiple falls.  In 2019 had 1 mechanical fall when she slipped and broke her hip.  Since then she has had no issues.  We had this discussion the importance of knowing this for treatment decisions placing IVC filter versus anticoagulation and complications of anticoagulation.  After much consensus a discussion it appears patient does not have multiple falls and she is okay with proceeding with anticoagulation. 6/28-transitioned to Eliquis from heparin on 6/27.  Will need to f/u with pcp for further mx    Acute hypoxic respiratory failure Likely due to atelectasis and acute CHF Received lasix x1 Cxr with atelectasis and small efuusion On RA 02 93%. CHF improved.   Covid +- testing for discharge was positive. Started on Remdesivir..day 1/3 Procalcitonin elevated, started on empiric abx for now Trend procalcitonin. IS   Cxr with pl eff and bnp less than before. Encouraged pt to use IS Supplemental o2 to keep 02 sat >92% Likely will need home 02 on d/c      AKI on CKD stage IIIb Baseline 1.29-1.4 6/28-mildly elevated from baseline Lasix and aldactone d/c'd Monitor   Hyperkalemia - K 5.5 Lokelma given Aldactone stopped on 6/27 but am dose was already given   CAD in native artery Elevated Troponins: Cards was consulted likely elevated troponins due to demand ischemia Echo with EF 35 to 40% with global hypokinesis.  And grade 2 diastolic dysfunction 1/24-PYK currently. Will need f/u with cards as outpt     GERD without esophagitis Continue PPI     COPD with asthma ([place]) Continue home mdi Will start albuterol nebs prn      DVT prophylaxis: eliquis Code Status:full  Family  Communication: daughter updated Disposition Plan:  Status is: inpatient  Patients status is inpatient because: Inpatient  level of care appropriate due to severity of illness  Dispo: The patient is [facility]              Anticipated d/c is [facility]              Patient currently is not medically stable to d/c.   Difficult to place patient No            LOS: 3 days   Time spent: 35 min with >50% on coc    Nolberto Hanlon, MD [facility] Pager [phone]  If 7PM-7AM, please contact night-coverage 04/25/2021, 3:29 PM

## 2021-04-25 NOTE — Consult Note (Signed)
Pharmacy Antibiotic Note  Linda Hart is a 87 y.o. female w/ h/o CAD (stents), mixed CHF (EF 35-40% in 2022), CKD, COPD & GERD admitted on 04/21/2021 with SOB found to have DVT/PE absent, Covid+, and c/f pneumonia.  Pharmacy has been consulted for Vancomycin & Cefepime dosing.  Plan: Vancomycin 1g x1 loading dose; then per current weight and renal function calculating vancomcyin 500mg  q48h. Will wait to order maintenance and reassess with AM creatinine given AKI and prolonged half-life. Predicted AUC of 583.9 (scr 1.76) Dosing per TBW<IBW; Vd 0.72 Cefepime 2g q24h  Also 6/28 started on 5d course of remdesivir for new covid infection.  Height: 5\' 4"  (162.6 cm) Weight: 40 kg (88 lb 2.9 oz) IBW/kg (Calculated) : 54.7  Temp (24hrs), Avg:97.8 F (36.6 C), Min:97.7 F (36.5 C), Max:97.9 F (36.6 C)  Recent Labs  Lab 04/21/21 1417 04/22/21 0331 04/23/21 0408 04/24/21 0619 04/24/21 0620 04/25/21 0529  WBC 18.6* 19.1* 13.2* 11.7*  --   --   CREATININE 1.50* 1.45* 1.58*  --  1.74* 1.76*    Estimated Creatinine Clearance: 12.6 mL/min (A) (by C-G formula based on SCr of 1.76 mg/dL (H)).    Allergies  Allergen Reactions   Penicillins     Has patient had a PCN reaction causing immediate rash, facial/tongue/throat swelling, SOB or lightheadedness with hypotension: Unknown Has patient had a PCN reaction causing severe rash involving mucus membranes or skin necrosis: Unknown Has patient had a PCN reaction that required hospitalization No Has patient had a PCN reaction occurring within the last 10 years: No If all of the above answers are "NO", then may proceed with Cephalosporin use.    Antimicrobials this admission: Vancomycin 6/28 >>  Cefepime 6/28 >>  Remdesivir (6/28-7/3)  Dose adjustments this admission: AKI; will CTM and adjust prn  Microbiology results: 6/24 Covid negative --> 6/28 Covid: positive  6/25 MRSA PCR: negative --> 6/28 MRSA PCR: pending  Thank you for  allowing pharmacy to be a part of this patient's care.  Lorna Dibble 04/25/2021 10:47 AM

## 2021-04-25 NOTE — Plan of Care (Signed)
  Problem: Clinical Measurements: Goal: Respiratory complications will improve Outcome: Progressing   Problem: Clinical Measurements: Goal: Cardiovascular complication will be avoided Outcome: Progressing   Problem: Safety: Goal: Ability to remain free from injury will improve Outcome: Progressing   

## 2021-04-25 NOTE — Progress Notes (Signed)
PT Cancellation Note  Patient Details Name: Iline Buchinger MRN: [id number] DOB: [birth year]   Cancelled Treatment:    Reason Eval/Treat Not Completed: Other (comment). Significant increase in K+ this date, now at 5.5. Not appropriate for PT at this time, will re-attempt next date if able.   Ada Holness 04/25/2021, 1:27 PM Greggory Stallion, PT, DPT [phone]

## 2021-04-25 NOTE — Care Management Important Message (Signed)
Important Message  Patient Details  Name: Evangelyne Loja MRN: [id number] Date of Birth: [birth year]   Medicare Important Message Given:  Yes - Important Message mailed due to current National Emergency  Reviewed Medicare IM with Alma Downs, daughter at [phone].  Copy of Medicare IM sent securely to daughter's attention at [email or web address].   Dannette Barbara 04/25/2021, 10:06 AM

## 2021-04-26 DIAGNOSIS — U071 COVID-19: Secondary | ICD-10-CM

## 2021-04-26 DIAGNOSIS — E43 Unspecified severe protein-calorie malnutrition: Secondary | ICD-10-CM

## 2021-04-26 LAB — CBC WITH DIFFERENTIAL/PLATELET
Abs Immature Granulocytes: 0.05 10*3/uL (ref 0.00–0.07)
Basophils Absolute: 0 10*3/uL (ref 0.0–0.1)
Basophils Relative: 0 %
Eosinophils Absolute: 0.1 10*3/uL (ref 0.0–0.5)
Eosinophils Relative: 1 %
HCT: 31 % — ABNORMAL LOW (ref 36.0–46.0)
Hemoglobin: 10.2 g/dL — ABNORMAL LOW (ref 12.0–15.0)
Immature Granulocytes: 0 %
Lymphocytes Relative: 8 %
Lymphs Abs: 1 10*3/uL (ref 0.7–4.0)
MCH: 32.5 pg (ref 26.0–34.0)
MCHC: 32.9 g/dL (ref 30.0–36.0)
MCV: 98.7 fL (ref 80.0–100.0)
Monocytes Absolute: 1.3 10*3/uL — ABNORMAL HIGH (ref 0.1–1.0)
Monocytes Relative: 10 %
Neutro Abs: 9.8 10*3/uL — ABNORMAL HIGH (ref 1.7–7.7)
Neutrophils Relative %: 81 %
Platelets: 202 10*3/uL (ref 150–400)
RBC: 3.14 MIL/uL — ABNORMAL LOW (ref 3.87–5.11)
RDW: 13.8 % (ref 11.5–15.5)
WBC: 12.4 10*3/uL — ABNORMAL HIGH (ref 4.0–10.5)
nRBC: 0 % (ref 0.0–0.2)

## 2021-04-26 LAB — COMPREHENSIVE METABOLIC PANEL
ALT: 18 U/L (ref 0–44)
AST: 30 U/L (ref 15–41)
Albumin: 2.5 g/dL — ABNORMAL LOW (ref 3.5–5.0)
Alkaline Phosphatase: 62 U/L (ref 38–126)
Anion gap: 8 (ref 5–15)
BUN: 51 mg/dL — ABNORMAL HIGH (ref 8–23)
CO2: 25 mmol/L (ref 22–32)
Calcium: 8.2 mg/dL — ABNORMAL LOW (ref 8.9–10.3)
Chloride: 104 mmol/L (ref 98–111)
Creatinine, Ser: 1.47 mg/dL — ABNORMAL HIGH (ref 0.44–1.00)
GFR, Estimated: 33 mL/min — ABNORMAL LOW (ref >60)
Glucose, Bld: 88 mg/dL (ref 70–99)
Potassium: 4.3 mmol/L (ref 3.5–5.1)
Sodium: 137 mmol/L (ref 135–145)
Total Bilirubin: 0.8 mg/dL (ref 0.3–1.2)
Total Protein: 5.4 g/dL — ABNORMAL LOW (ref 6.5–8.1)

## 2021-04-26 LAB — FERRITIN: Ferritin: 317 ng/mL — ABNORMAL HIGH (ref 11–307)

## 2021-04-26 LAB — D-DIMER, QUANTITATIVE: D-Dimer, Quant: 1.16 ug/mL-FEU — ABNORMAL HIGH (ref 0.00–0.50)

## 2021-04-26 LAB — C-REACTIVE PROTEIN: CRP: 6.1 mg/dL — ABNORMAL HIGH (ref <1.0)

## 2021-04-26 MED ORDER — CEFDINIR 300 MG PO CAPS
300.0000 mg | ORAL_CAPSULE | ORAL | Status: DC
  Administered 2021-04-26: 300 mg via ORAL
  Filled 2021-04-26 (×2): qty 1

## 2021-04-26 NOTE — Progress Notes (Addendum)
[place] at [place] NAME: Linda Hart    MR#:  [id number]  DATE OF BIRTH:  1924/11/20  SUBJECTIVE:  patient is hard on hearing. Answered few questions appropriately. No family in the room due to [place]. Overall no issues per RN. No respiratory distress noted. Oxygen sats are 98-100% on 2 L nasal cannula drinking her ensure  REVIEW OF SYSTEMS:   Review of Systems  Unable to perform ROS: Other impaired hearing Tolerating Diet:yes Tolerating PT: SNF  DRUG ALLERGIES:   Allergies  Allergen Reactions   Penicillins     Has patient had a PCN reaction causing immediate rash, facial/tongue/throat swelling, SOB or lightheadedness with hypotension: Unknown Has patient had a PCN reaction causing severe rash involving mucus membranes or skin necrosis: Unknown Has patient had a PCN reaction that required hospitalization No Has patient had a PCN reaction occurring within the last 10 years: No If all of the above answers are "NO", then may proceed with Cephalosporin use.    VITALS:  Blood pressure (!) 176/55, pulse 72, temperature 97.7 F (36.5 C), resp. rate 20, height 5\' 4"  (1.626 m), weight 39.4 kg, SpO2 100 %.  PHYSICAL EXAMINATION:   Physical Exam  GENERAL:  87 y.o.-year-old patient lying in the bed with no acute distress. HOH+   LUNGS: Normal breath sounds bilaterally, no wheezing, rales, rhonchi. No use of accessory muscles of respiration.  CARDIOVASCULAR: S1, S2 normal. No murmurs, rubs, or gallops.  ABDOMEN: Soft, nontender, nondistended. Bowel sounds present. No organomegaly or mass.  EXTREMITIES: No cyanosis, clubbing or edema b/l.    NEUROLOGIC: nonfocal  PSYCHIATRIC:  patient is alert and awake  SKIN: No obvious rash, lesion, or ulcer.   LABORATORY PANEL:  CBC Recent Labs  Lab 04/26/21 0515  WBC 12.4*  HGB 10.2*  HCT 31.0*  PLT 202    Chemistries  Recent Labs  Lab 04/26/21 0515  NA 137  K 4.3  CL 104  CO2 25   GLUCOSE 88  BUN 51*  CREATININE 1.47*  CALCIUM 8.2*  AST 30  ALT 18  ALKPHOS 62  BILITOT 0.8   Cardiac Enzymes No results for input(s): TROPONINI in the last 168 hours. RADIOLOGY:  No results found. ASSESSMENT AND PLAN:  Linda Hart is a 87 y.o. female with medical history significant for coronary artery disease with stent placement, chronic systolic and diastolic congestive heart failure with EF 40-45% in 2016, COPD, GERD, who presents to the emergency department on 04/21/2021 with shortness of breat Got covid before discharge and was positive. Started on IV remdesivir.   Acute left lower extremity DVT -- continue oral eliquis -- patient's daughter Belenda Cruise aware of side effects from anticoagulation. According to notes patient has not had falls -- patient resides at [facility]  acute hypoxic respiratory failure suspected due to acute CHF diastolic improved after receiving IV Lasix  COVID positive testing prior to discharge -- chest x-ray showed mild atelectasis -- mild elevated Pro calcitonin on empiric antibiotic for five days -- continue remdesivir 2/3 days -- wean to room air -- inflammatory markers trending down  Linda Hart -- continue PPI  COPD with asthma -- PRN inhalers  Nutrition Status: Nutrition Problem: Severe Malnutrition Etiology: chronic illness Signs/Symptoms: severe fat depletion, severe muscle depletion Interventions: Ensure Enlive (each supplement provides 350kcal and 20 grams of protein), Magic cup, MVI, Liberalize Diet    Procedures: Family communication : daughter Curt Bears on the phone Consults : CODE STATUS: DNR  DNI DVT Prophylaxis : eliquis Level of care: Med-Surg Status is: Inpatient  Remains inpatient appropriate because:Inpatient level of care appropriate due to severity of illness  Dispo: The patient is from: ALF              Anticipated d/c is to: ALF              Patient currently is not medically stable to d/c.   Difficult  to place patient No  patient overall hemodynamically stable. She is wean to room air. She'll discharged to [facility] tomorrow after completion of her COVID IV antiviral infusion. This with daughter and in agreement.      TOTAL TIME TAKING CARE OF THIS PATIENT: 25 minutes.  >50% time spent on counselling and coordination of care  Note: This dictation was prepared with Dragon dictation along with smaller phrase technology. Any transcriptional errors that result from this process are unintentional.  Fritzi Mandes M.D    [facility]   CC: Primary care physician; Kirk Ruths, MD Patient ID: Linda Hart, female   DOB: 1925-05-10, 87 y.o.   MRN: [id number]

## 2021-04-26 NOTE — Progress Notes (Signed)
Occupational Therapy Treatment Patient Details Name: Linda Hart MRN: [id number] DOB: 01-07-1925 Today's Date: 04/26/2021    History of present illness presented to ER secondary to chest pain, LE edema; admitted for management of acute R LE DVT (managed with heparin), increased troponin (demand per chart).   OT comments  Upon entering the room, pt supine in bed and sleeping soundly. Pt very HOH and overall does not feel well. Pt reports feeling SOB but continues to pull O2 from nostrils during session and needing cuing to leave it in. RN notified pt reports her nose hurts(may be very dry). Pt agreeable to EOB with encouragement with min guard. Pt seated on EOB, pt reaching out laterally to obtain ensure and taking several sips. Lunch untouched when therapist entered the room. Pt performed grooming tasks with set up A on EOB. OT assisted pt with calling her daughter and returned to bed with bed alarm activated and all needs within reach.   Follow Up Recommendations  SNF;Supervision/Assistance - 24 hour          Precautions / Restrictions Precautions Precautions: Fall       Mobility Bed Mobility Overal bed mobility: Needs Assistance Bed Mobility: Supine to Sit;Sit to Supine     Supine to sit: Min guard;HOB elevated Sit to supine: Min guard;HOB elevated   General bed mobility comments: min cuing for technique             General transfer comment: Pt declined    Balance Overall balance assessment: Needs assistance Sitting-balance support: Feet unsupported;Bilateral upper extremity supported Sitting balance-Leahy Scale: Fair                                     ADL either performed or assessed with clinical judgement   ADL Overall ADL's : Needs assistance/impaired     Grooming: Wash/dry hands;Wash/dry face;Bed level;Set up                                       Vision Baseline Vision/History: Wears glasses Wears Glasses: At all  times Patient Visual Report: No change from baseline            Cognition Arousal/Alertness: Awake/alert Behavior During Therapy: WFL for tasks assessed/performed Overall Cognitive Status: Impaired/Different from baseline                                 General Comments: Pt is oriented to self and location only this session. very HOH                   Pertinent Vitals/ Pain       Pain Assessment: Faces Faces Pain Scale: No hurt   Frequency  Min 2X/week        Progress Toward Goals  OT Goals(current goals can now be found in the care plan section)  Progress towards OT goals: Progressing toward goals  Acute Rehab OT Goals Patient Stated Goal: to rest OT Goal Formulation: With family Time For Goal Achievement: 05/08/21 Potential to Achieve Goals: Good  Plan Discharge plan remains appropriate;Frequency remains appropriate       AM-PAC OT "6 Clicks" Daily Activity     Outcome Measure   Help from another person eating meals?: A Little Help from another person  taking care of personal grooming?: A Little Help from another person toileting, which includes using toliet, bedpan, or urinal?: A Lot Help from another person bathing (including washing, rinsing, drying)?: A Lot Help from another person to put on and taking off regular upper body clothing?: A Little Help from another person to put on and taking off regular lower body clothing?: A Lot 6 Click Score: 15    End of Session Equipment Utilized During Treatment: Oxygen  OT Visit Diagnosis: Unsteadiness on feet (R26.81);Muscle weakness (generalized) (M62.81)   Activity Tolerance Patient limited by fatigue   Patient Left in bed;with call bell/phone within reach;with bed alarm set;with family/visitor present   Nurse Communication Mobility status        Time: 1345-1413 OT Time Calculation (min): 28 min  Charges: OT General Charges $OT Visit: 1 Visit OT Treatments $Self Care/Home  Management : 8-22 mins $Therapeutic Activity: 8-22 mins  Darleen Crocker, MS, OTR/L , CBIS ascom [phone]  04/26/21, 2:29 PM

## 2021-04-26 NOTE — Progress Notes (Signed)
Physical Therapy Treatment Patient Details Name: Linda Hart MRN: [id number] DOB: 1925-03-16 Today's Date: 04/26/2021    History of Present Illness presented to ER secondary to chest pain, LE edema; admitted for management of acute R LE DVT (managed with heparin), increased troponin (demand per chart).    PT Comments    Pt is making gradual progress towards goals. Reluctant to perform OOB, takes heavy encouragement. Once up, able to mobilize fairly well. Still symptomatic with SOB, although O2 sats at 92% on RA. RN notified. Short distance tolerated prior to fatigue. Daughter at bedside. Will continue to progress.  Follow Up Recommendations  SNF     Equipment Recommendations  None recommended by PT    Recommendations for Other Services       Precautions / Restrictions Precautions Precautions: Fall Restrictions Weight Bearing Restrictions: No    Mobility  Bed Mobility Overal bed mobility: Needs Assistance Bed Mobility: Supine to Sit;Sit to Supine     Supine to sit: Min guard;HOB elevated Sit to supine: Min guard;HOB elevated   General bed mobility comments: safe technique with ease of transitioning    Transfers Overall transfer level: Needs assistance Equipment used: Rolling walker (2 wheeled);1 person hand held assist Transfers: Sit to/from Stand Sit to Stand: Min guard         General transfer comment: heavy encouragement to perform  Ambulation/Gait Ambulation/Gait assistance: Min guard Gait Distance (Feet): 50 Feet Assistive device: Rolling walker (2 wheeled) Gait Pattern/deviations: Step-through pattern     General Gait Details: short shuffle gait pattern. Quick fatigue. All mobility performed on RA with sats at 92%. Still endorces SOB with exertion   Stairs             Wheelchair Mobility    Modified Rankin (Stroke Patients Only)       Balance Overall balance assessment: Needs assistance Sitting-balance support: Feet  unsupported;Bilateral upper extremity supported Sitting balance-Leahy Scale: Fair     Standing balance support: Bilateral upper extremity supported Standing balance-Leahy Scale: Fair                              Cognition Arousal/Alertness: Awake/alert Behavior During Therapy: WFL for tasks assessed/performed Overall Cognitive Status: Impaired/Different from baseline                                 General Comments: takes max encouragement to participate. very HOH      Exercises Other Exercises Other Exercises: ambulated over to [facility], needs min assist for hygiene    General Comments        Pertinent Vitals/Pain Pain Assessment: No/denies pain Faces Pain Scale: No hurt    Home Living                      Prior Function            PT Goals (current goals can now be found in the care plan section) Acute Rehab PT Goals Patient Stated Goal: to rest PT Goal Formulation: With patient Time For Goal Achievement: 05/07/21 Potential to Achieve Goals: Good Progress towards PT goals: Progressing toward goals    Frequency    Min 2X/week      PT Plan Current plan remains appropriate    Co-evaluation              AM-PAC PT "6 Clicks" Mobility  Outcome Measure  Help needed turning from your back to your side while in a flat bed without using bedrails?: A Little Help needed moving from lying on your back to sitting on the side of a flat bed without using bedrails?: A Little Help needed moving to and from a bed to a chair (including a wheelchair)?: A Little Help needed standing up from a chair using your arms (e.g., wheelchair or bedside chair)?: A Little Help needed to walk in hospital room?: A Little Help needed climbing 3-5 steps with a railing? : A Little 6 Click Score: 18    End of Session   Activity Tolerance: Patient tolerated treatment well Patient left: in chair;with chair alarm set;with family/visitor present Nurse  Communication: Mobility status PT Visit Diagnosis: Muscle weakness (generalized) (M62.81);Difficulty in walking, not elsewhere classified (R26.2)     Time: 7867-6720 PT Time Calculation (min) (ACUTE ONLY): 32 min  Charges:  $Gait Training: 8-22 mins $Therapeutic Activity: 8-22 mins                     Greggory Stallion, PT, DPT [phone]    [facility] 04/26/2021, 5:07 PM

## 2021-04-27 LAB — BASIC METABOLIC PANEL
Anion gap: 10 (ref 5–15)
BUN: 52 mg/dL — ABNORMAL HIGH (ref 8–23)
CO2: 23 mmol/L (ref 22–32)
Calcium: 8.2 mg/dL — ABNORMAL LOW (ref 8.9–10.3)
Chloride: 104 mmol/L (ref 98–111)
Creatinine, Ser: 1.46 mg/dL — ABNORMAL HIGH (ref 0.44–1.00)
GFR, Estimated: 33 mL/min — ABNORMAL LOW (ref >60)
Glucose, Bld: 97 mg/dL (ref 70–99)
Potassium: 4.6 mmol/L (ref 3.5–5.1)
Sodium: 137 mmol/L (ref 135–145)

## 2021-04-27 LAB — D-DIMER, QUANTITATIVE: D-Dimer, Quant: 1.17 ug/mL-FEU — ABNORMAL HIGH (ref 0.00–0.50)

## 2021-04-27 LAB — MAGNESIUM: Magnesium: 2.1 mg/dL (ref 1.7–2.4)

## 2021-04-27 LAB — FERRITIN: Ferritin: 318 ng/mL — ABNORMAL HIGH (ref 11–307)

## 2021-04-27 MED ORDER — CEFDINIR 300 MG PO CAPS: 300.0000 mg | ORAL_CAPSULE | ORAL | 0 refills | Status: AC

## 2021-04-27 MED ORDER — GUAIFENESIN-DM 100-10 MG/5ML PO SYRP: 10.0000 mL | ORAL_SOLUTION | ORAL | 0 refills | Status: AC | PRN

## 2021-04-27 MED ORDER — MIRTAZAPINE 15 MG PO TABS: 7.5000 mg | ORAL_TABLET | Freq: Every day | ORAL | Status: DC

## 2021-04-27 MED ORDER — APIXABAN 5 MG PO TABS: 10.0000 mg | ORAL_TABLET | Freq: Two times a day (BID) | ORAL | 2 refills | Status: DC

## 2021-04-27 MED ORDER — MIRTAZAPINE 7.5 MG PO TABS: 7.5000 mg | ORAL_TABLET | Freq: Every day | ORAL | 0 refills | Status: AC

## 2021-04-27 NOTE — Progress Notes (Addendum)
CCMD called and reported a 5 beats non- sustain vtach HR up to 140's for 2 second then went down to 89. On assessment pt was asymptomatic and VSS. Will continue to monitor.  Update 0620: Mansy ordered Magnesium level and if the level comes back less than two is to order Mag sulfate 2 g IV once. Will notify incoming shift. Will continue to monitor.

## 2021-04-27 NOTE — TOC Transition Note (Signed)
Transition of Care [facility]) - CM/SW Discharge Note   Patient Details  Name: Linda Hart MRN: [id number] Date of Birth: 05-18-1925  [facility] of [facility]) CM/SW Contact:  Eileen Stanford, LCSW Phone Number: 04/27/2021, 12:18 PM   Clinical Narrative:  Clinical Social Worker facilitated patient discharge including contacting patient family and facility to confirm patient discharge plans.  Clinical information faxed to facility and family agreeable with plan.  CSW arranged ambulance transport via [facility] to [place] .  RN to call [phone] for report prior to discharge.    Final next level of care: Skilled Nursing Facility Barriers to Discharge: No Barriers Identified   Patient Goals and CMS Choice        Discharge Placement              Patient chooses bed at:  [facility]) Patient to be transferred to facility by: [facility] Name of family member notified: daughter Patient and family notified of of transfer: 04/27/21  Discharge Plan and Services                                     Social Determinants of Health ([place]) Interventions     Readmission Risk Interventions No flowsheet data found.

## 2021-04-27 NOTE — Discharge Summary (Addendum)
[facility] NAME: Linda Hart    MR#:  [id number]  DATE OF BIRTH:  [birth year]  DATE OF ADMISSION:  04/21/2021 ADMITTING PHYSICIAN: Tacey Ruiz, MD  DATE OF DISCHARGE: 04/27/2021  PRIMARY CARE PHYSICIAN: Kirk Ruths, MD    ADMISSION DIAGNOSIS:  Acute deep vein thrombosis (DVT) of left femoral vein (HCC) [I82.412] Acute deep vein thrombosis (DVT) of left lower extremity ([place]) [I82.402]  DISCHARGE DIAGNOSIS:  Acute Left LE DVT Covid infection  SECONDARY DIAGNOSIS:   Past Medical History:  Diagnosis Date   COPD (chronic obstructive pulmonary disease) ([place])    Coronary artery disease 03/06/2013   90% prox LAD stenosis s/p DES   Difficult intubation    Dizziness    GERD (gastroesophageal reflux disease)    Hypotension    Ischemic cardiomyopathy 03/09/2013   s/p NSTEMI. EF 30-35%.   MI (myocardial infarction) ([place])    PAF (paroxysmal atrial fibrillation) ([place]) 03/06/2013   In setting of NSTEMI and reperfusion, no recurrence   SVT (supraventricular tachycardia) ([place])    Self-limited on outpatient cardiac monitor    HOSPITAL COURSE:  Linda Hart is a 87 y.o. female with medical history significant for coronary artery disease with stent placement, chronic systolic and diastolic congestive heart failure with EF 40-45% in 2016, COPD, GERD, who presents to the emergency department on 04/21/2021 with shortness of breat Got covid before discharge and was positive. Started on IV remdesivir.    Acute left lower extremity DVT -- continue oral eliquis -- patient's daughter Belenda Cruise aware of side effects from anticoagulation. According to notes patient has not had falls -- [facility] at [facility]   acute hypoxic respiratory failure suspected due to acute CHF diastolic improved after receiving IV Lasix Sats >92% on RA. No respiratory distress -cont coreg --v/q scan: Very low probability examination  for pulmonary embolism by modified perfusion only PIOPED criteria (PE absent).     COVID positive testing prior to discharge -- chest x-ray showed mild atelectasis -- mild elevated Pro calcitonin on empiric antibiotic for five days -- received remdesivir 3/3 days -- weaned to room air -- inflammatory markers trending down   Jerrye Bushy -- continue PPI   COPD with asthma -- PRN inhalers   Nutrition Status: Nutrition Problem: Severe Malnutrition Etiology: chronic illness Signs/Symptoms: severe fat depletion, severe muscle depletion Interventions: Ensure Enlive (each supplement provides 350kcal and 20 grams of protein), Magic cup, MVI, Liberalize Diet   Added low dose Remeron for appetite and mild anxiety. Dter Inez Catalina aware     Procedures:none Family communication : daughter Curt Bears on the phone today and discussed d/c palns Consults : CODE STATUS: DNR DNI DVT Prophylaxis : eliquis Level of care: Med-Surg Status is: Inpatient   Remains inpatient appropriate because:Inpatient level of care appropriate due to severity of illness   Dispo: The patient is from: ALF              Anticipated d/c is to: [facility]              Patient currently is medically optimized and ok to  d/c.              Difficult to place patient No  CONSULTS OBTAINED:    DRUG ALLERGIES:   Allergies  Allergen Reactions   Penicillins     Has patient had a PCN reaction causing immediate rash, facial/tongue/throat swelling, SOB or lightheadedness with hypotension: Unknown Has patient had a PCN reaction  causing severe rash involving mucus membranes or skin necrosis: Unknown Has patient had a PCN reaction that required hospitalization No Has patient had a PCN reaction occurring within the last 10 years: No If all of the above answers are "NO", then may proceed with Cephalosporin use.    DISCHARGE MEDICATIONS:   Allergies as of 04/27/2021       Reactions   Penicillins    Has patient had a PCN reaction  causing immediate rash, facial/tongue/throat swelling, SOB or lightheadedness with hypotension: Unknown Has patient had a PCN reaction causing severe rash involving mucus membranes or skin necrosis: Unknown Has patient had a PCN reaction that required hospitalization No Has patient had a PCN reaction occurring within the last 10 years: No If all of the above answers are "NO", then may proceed with Cephalosporin use.        Medication List     TAKE these medications    apixaban 5 MG Tabs tablet Commonly known as: ELIQUIS Take 2 tablets (10 mg total) by mouth 2 (two) times daily. And then 5 mg (1 tab) bid from 05/01/2021   aspirin EC 81 MG tablet Take 1 tablet (81 mg total) by mouth daily.   carvedilol 3.125 MG tablet Commonly known as: COREG Take 3.125 mg by mouth daily as needed (tachycardia, palpitations or HR >100). What changed: Another medication with the same name was changed. Make sure you understand how and when to take each.   carvedilol 3.125 MG tablet Commonly known as: COREG TAKE ONE TABLET TWICE A DAY WITH MEALS. What changed: See the new instructions.   cefdinir 300 MG capsule Commonly known as: OMNICEF Take 1 capsule (300 mg total) by mouth daily for 4 days.   Cholecalciferol 100 MCG (4000 UT) Caps Take 4,000 Units by mouth every other day.   feeding supplement Liqd Take 1 Bottle by mouth every other day.   guaiFENesin-dextromethorphan 100-10 MG/5ML syrup Commonly known as: ROBITUSSIN DM Take 10 mLs by mouth every 4 (four) hours as needed for cough.   levothyroxine 25 MCG tablet Commonly known as: SYNTHROID Take 25 mcg by mouth daily.   mirtazapine 7.5 MG tablet Commonly known as: REMERON Take 1 tablet (7.5 mg total) by mouth at bedtime.   nitroGLYCERIN 0.4 MG SL tablet Commonly known as: NITROSTAT Place 0.4 mg under the tongue every 5 (five) minutes as needed for chest pain.   omeprazole 20 MG capsule Commonly known as: PRILOSEC Take 20 mg by  mouth daily.   spironolactone 25 MG tablet Commonly known as: ALDACTONE Take 12.5 mg by mouth daily.   vitamin B-12 1000 MCG tablet Commonly known as: CYANOCOBALAMIN Take 1,000 mcg by mouth daily.   vitamin C 1000 MG tablet Take 1,000 mg by mouth daily.               Durable Medical Equipment  (From admission, onward)           Start     Ordered   04/24/21 1140  For home use only DME oxygen  Once       Question Answer Comment  Length of Need 6 Months   Mode or (Route) Nasal cannula   Liters per Minute 2   Oxygen conserving device Yes   Oxygen delivery system Gas      04/24/21 1139            If you experience worsening of your admission symptoms, develop shortness of breath, life threatening emergency, suicidal or homicidal  thoughts you must seek medical attention immediately by calling 911 or calling your MD immediately  if symptoms less severe.  You Must read complete instructions/literature along with all the possible adverse reactions/side effects for all the Medicines you take and that have been prescribed to you. Take any new Medicines after you have completely understood and accept all the possible adverse reactions/side effects.   Please note  You were cared for by a hospitalist during your hospital stay. If you have any questions about your discharge medications or the care you received while you were in the hospital after you are discharged, you can call the unit and asked to speak with the hospitalist on call if the hospitalist that took care of you is not available. Once you are discharged, your primary care physician will handle any further medical issues. Please note that NO REFILLS for any discharge medications will be authorized once you are discharged, as it is imperative that you return to your primary care physician (or establish a relationship with a primary care physician if you do not have one) for your aftercare needs so that they can reassess  your need for medications and monitor your lab values. Today   SUBJECTIVE   patient sitting at the edge of the bed earlier and attention to eat breakfast. She is been drinking ensure all. No respiratory distress. She is thankful for removing the oxygen tubing. Sats hundred percent. No respiratory distress. Will hard on hearing. No family at bedside earlier.  VITAL SIGNS:  Blood pressure (!) 155/73, pulse 81, temperature 97.9 F (36.6 C), resp. rate 20, height 5\' 4"  (1.626 m), weight 38.9 kg, SpO2 99 %.  I/O:   Intake/Output Summary (Last 24 hours) at 04/27/2021 1021 Last data filed at 04/27/2021 0500 Gross per 24 hour  Intake 80 ml  Output 150 ml  Net -70 ml    PHYSICAL EXAMINATION:  GENERAL:  87 y.o.-year-old patient lying in the bed with no acute distress. Thin frail LUNGS: Normal breath sounds bilaterally, no wheezing, rales,rhonchi or crepitation. No use of accessory muscles of respiration.  CARDIOVASCULAR: S1, S2 normal. No murmurs, rubs, or gallops.  ABDOMEN: Soft, non-tender, non-distended. Bowel sounds present. No organomegaly or mass.  EXTREMITIES: No pedal edema, cyanosis, or clubbing.  NEUROLOGIC: nonfocal PSYCHIATRIC: The patient is alert and awake SKIN: No obvious rash, lesion, or ulcer.   DATA REVIEW:   CBC  Recent Labs  Lab 04/26/21 0515  WBC 12.4*  HGB 10.2*  HCT 31.0*  PLT 202    Chemistries  Recent Labs  Lab 04/26/21 0515 04/27/21 0434  NA 137 137  K 4.3 4.6  CL 104 104  CO2 25 23  GLUCOSE 88 97  BUN 51* 52*  CREATININE 1.47* 1.46*  CALCIUM 8.2* 8.2*  MG  --  2.1  AST 30  --   ALT 18  --   ALKPHOS 62  --   BILITOT 0.8  --     Microbiology Results   Recent Results (from the past 240 hour(s))  Resp Panel by RT-PCR (Flu A&B, Covid) Nasopharyngeal Swab     Status: None   Collection Time: 04/21/21  3:03 PM   Specimen: Nasopharyngeal Swab; Nasopharyngeal(NP) swabs in vial transport medium  Result Value Ref Range Status   SARS Coronavirus  2 by RT PCR NEGATIVE NEGATIVE Final    Comment: (NOTE) SARS-CoV-2 target nucleic acids are NOT DETECTED.  The SARS-CoV-2 RNA is generally detectable in upper respiratory specimens during the acute phase  of infection. The lowest concentration of SARS-CoV-2 viral copies this assay can detect is 138 copies/mL. A negative result does not preclude SARS-Cov-2 infection and should not be used as the sole basis for treatment or other patient management decisions. A negative result may occur with  improper specimen collection/handling, submission of specimen other than nasopharyngeal swab, presence of viral mutation(s) within the areas targeted by this assay, and inadequate number of viral copies(<138 copies/mL). A negative result must be combined with clinical observations, patient history, and epidemiological information. The expected result is Negative.  Fact Sheet for Patients:  EntrepreneurPulse.com.au  Fact Sheet for Healthcare Providers:  IncredibleEmployment.be  This test is no t yet approved or cleared by the [place] FDA and  has been authorized for detection and/or diagnosis of SARS-CoV-2 by FDA under an Emergency Use Authorization (EUA). This EUA will remain  in effect (meaning this test can be used) for the duration of the COVID-19 declaration under Section 564(b)(1) of the Act, 21 U.S.C.section 360bbb-3(b)(1), unless the authorization is terminated  or revoked sooner.       Influenza A by PCR NEGATIVE NEGATIVE Final   Influenza B by PCR NEGATIVE NEGATIVE Final    Comment: (NOTE) The Xpert Xpress SARS-CoV-2/FLU/RSV plus assay is intended as an aid in the diagnosis of influenza from Nasopharyngeal swab specimens and should not be used as a sole basis for treatment. Nasal washings and aspirates are unacceptable for Xpert Xpress SARS-CoV-2/FLU/RSV testing.  Fact Sheet for Patients: EntrepreneurPulse.com.au  Fact  Sheet for Healthcare Providers: IncredibleEmployment.be  This test is not yet approved or cleared by the [place] FDA and has been authorized for detection and/or diagnosis of SARS-CoV-2 by FDA under an Emergency Use Authorization (EUA). This EUA will remain in effect (meaning this test can be used) for the duration of the COVID-19 declaration under Section 564(b)(1) of the Act, 21 U.S.C. section 360bbb-3(b)(1), unless the authorization is terminated or revoked.  Performed at [facility], [address] [place], [place] [address]   MRSA Next Gen by PCR, Nasal     Status: None   Collection Time: 04/22/21  2:00 PM  Result Value Ref Range Status   MRSA by PCR Next Gen NOT DETECTED NOT DETECTED Final    Comment: (NOTE) The GeneXpert MRSA Assay (FDA approved for NASAL specimens only), is one component of a comprehensive MRSA colonization surveillance program. It is not intended to diagnose MRSA infection nor to guide or monitor treatment for MRSA infections. Test performance is not FDA approved in patients less than 38 years old. Performed at [facility], [place], [address] [address]   SARS CORONAVIRUS 2 (TAT 6-24 HRS) Nasopharyngeal Nasopharyngeal Swab     Status: Abnormal   Collection Time: 04/24/21 12:00 PM   Specimen: Nasopharyngeal Swab  Result Value Ref Range Status   SARS Coronavirus 2 POSITIVE (A) NEGATIVE Final    Comment: (NOTE) SARS-CoV-2 target nucleic acids are DETECTED.  The SARS-CoV-2 RNA is generally detectable in upper and lower respiratory specimens during the acute phase of infection. Positive results are indicative of the presence of SARS-CoV-2 RNA. Clinical correlation with patient history and other diagnostic information is  necessary to determine patient infection status. Positive results do not rule out bacterial infection or co-infection with other viruses.  The expected result is  Negative.  Fact Sheet for Patients: SugarRoll.be  Fact Sheet for Healthcare Providers: https://www.woods-mathews.com/  This test is not yet approved or cleared by the [place] FDA  and  has been authorized for detection and/or diagnosis of SARS-CoV-2 by FDA under an Emergency Use Authorization (EUA). This EUA will remain  in effect (meaning this test can be used) for the duration of the COVID-19 declaration under Section 564(b)(1) of the Act, 21 U. S.C. section 360bbb-3(b)(1), unless the authorization is terminated or revoked sooner.   Performed at [facility], [place] [address] [place], [place] [address]   MRSA Next Gen by PCR, Nasal     Status: None   Collection Time: 04/25/21 11:33 AM   Specimen: Nasal Mucosa; Nasal Swab  Result Value Ref Range Status   MRSA by PCR Next Gen NOT DETECTED NOT DETECTED Final    Comment: (NOTE) The GeneXpert MRSA Assay (FDA approved for NASAL specimens only), is one component of a comprehensive MRSA colonization surveillance program. It is not intended to diagnose MRSA infection nor to guide or monitor treatment for MRSA infections. Test performance is not FDA approved in patients less than 26 years old. Performed at [facility], [address] [place], [place] [address]     RADIOLOGY:  No results found.   CODE STATUS:     Code Status Orders  (From admission, onward)           Start     Ordered   04/22/21 1745  Do not attempt resuscitation (DNR)  Continuous       Question Answer Comment  In the event of cardiac or respiratory ARREST Do not call a "code blue"   In the event of cardiac or respiratory ARREST Do not perform Intubation, CPR, defibrillation or ACLS   In the event of cardiac or respiratory ARREST Use medication by any route, position, wound care, and other measures to relive pain and suffering. May use oxygen, suction and manual treatment of airway obstruction  as needed for comfort.   Comments confirmed with daughter      04/22/21 1744           Code Status History     Date Active Date Inactive Code Status Order ID Comments User Context   04/21/2021 2026 04/22/2021 1744 Full Code [id number]  Tacey Ruiz, MD ED   08/19/2017 0846 08/22/2017 1807 DNR [id number]  Herma Carson, RN Inpatient   08/17/2017 2128 08/19/2017 0846 Full Code [id number]  Henreitta Leber, MD Inpatient   09/24/2015 0835 09/26/2015 1920 DNR [id number]  Max Sane, MD Inpatient   09/23/2015 1203 09/24/2015 0835 Full Code [id number]  Bettey Costa, MD Inpatient      Advance Directive Documentation    Flowsheet Row Most Recent Value  Type of Advance Directive Out of facility DNR (pink MOST or yellow form)  Pre-existing out of facility DNR order (yellow form or pink MOST form) --  "MOST" Form in Place? --        TOTAL TIME TAKING CARE OF THIS PATIENT: 35 minutes.    Fritzi Mandes M.D  [facility]    CC: Primary care physician; Kirk Ruths, MD

## 2021-04-27 NOTE — Plan of Care (Signed)
  Problem: Clinical Measurements: Goal: Will remain free from infection Outcome: Progressing   Problem: Clinical Measurements: Goal: Respiratory complications will improve Outcome: Progressing   Problem: Clinical Measurements: Goal: Cardiovascular complication will be avoided Outcome: Progressing   Problem: Activity: Goal: Risk for activity intolerance will decrease Outcome: Progressing   Problem: Elimination: Goal: Will not experience complications related to bowel motility Outcome: Progressing   Problem: Elimination: Goal: Will not experience complications related to urinary retention Outcome: Progressing   Problem: Safety: Goal: Ability to remain free from injury will improve Outcome: Progressing

## 2021-04-27 NOTE — NC FL2 (Signed)
[place] LEVEL OF CARE SCREENING TOOL     IDENTIFICATION  Patient Name: Linda Hart Birthdate: [birth year] Sex: female Admission Date (Current Location): 04/21/2021  [facility] and [place] Number:  Engineering geologist and Address:  [facility], [address] [place], [place] [address]      Provider Number: [id number]  Attending Physician Name and Address:  Fritzi Mandes, MD  Relative Name and Phone Number:       Current Level of Care: Hospital Recommended Level of Care: [place] Prior Approval Number:    Date Approved/Denied:   PASRR Number: [id number]  Discharge Plan: SNF    Current Diagnoses: Patient Active Problem List   Diagnosis Date Noted   COVID-19 virus infection    Protein-calorie malnutrition, severe 04/24/2021   Acute deep vein thrombosis (DVT) of left lower extremity ([place]) 04/21/2021   Acute pulmonary embolism ([place]) 04/21/2021   Hypoxia 04/21/2021   AKI (acute kidney injury) ([place]) 04/21/2021   COPD with asthma ([place]) 08/11/2018   GERD without esophagitis 05/12/2018   Closed right hip fracture ([place]) 08/17/2017   Cutaneous lupus erythematosus 04/05/2017   Anorexia 04/20/2016   Bradycardia 04/20/2016   Chronic combined systolic and diastolic heart failure ([place]) 09/24/2015   Acute on chronic congestive heart failure ([place])    Anemia 01/26/2014   Dry eyes 01/04/2014   Cardiomyopathy, ischemic 03/17/2013   Hyperlipidemia 03/17/2013   CAD in native artery 03/17/2013   PAF (paroxysmal atrial fibrillation) ([place]) 03/17/2013   Pernicious anemia 07/10/2011   SVT (supraventricular tachycardia) ([place]) 03/21/2011    Orientation RESPIRATION BLADDER Height & Weight     Self, Place  Normal Continent Weight: 85 lb 12.1 oz (38.9 kg) Height:  5\' 4"  (162.6 cm)  BEHAVIORAL SYMPTOMS/MOOD NEUROLOGICAL BOWEL NUTRITION STATUS      Continent Diet (heart healthy, thin liquids)  AMBULATORY STATUS COMMUNICATION OF  NEEDS Skin   Limited Assist Verbally Normal                       Personal Care Assistance Level of Assistance  Bathing, Feeding, Dressing Bathing Assistance: Limited assistance Feeding assistance: Independent Dressing Assistance: Limited assistance     Functional Limitations Info  Sight, Hearing, Speech Sight Info: Adequate Hearing Info: Adequate Speech Info: Adequate    SPECIAL CARE FACTORS FREQUENCY  PT (By licensed PT), OT (By licensed OT)     PT Frequency: 5x OT Frequency: 5x            Contractures Contractures Info: Not present    Additional Factors Info  Code Status, Allergies, Isolation Precautions Code Status Info: DNR Allergies Info: penicillins     Isolation Precautions Info: Air/Con- COVID     Current Medications (04/27/2021):  This is the current hospital active medication list Current Facility-Administered Medications  Medication Dose Route Frequency Provider Last Rate Last Admin   acetaminophen (TYLENOL) tablet 650 mg  650 mg Oral Q6H PRN Tacey Ruiz, MD   650 mg at 04/24/21 0847   Or   acetaminophen (TYLENOL) suppository 650 mg  650 mg Rectal Q6H PRN Tacey Ruiz, MD       albuterol (PROVENTIL) (2.5 MG/3ML) 0.083% nebulizer solution 2.5 mg  2.5 mg Nebulization Q4H PRN Nolberto Hanlon, MD   2.5 mg at 04/22/21 1523   apixaban (ELIQUIS) tablet 10 mg  10 mg Oral BID Nolberto Hanlon, MD   10 mg at 04/27/21 0954   Followed by   Derrill Memo ON 05/01/2021] apixaban (  ELIQUIS) tablet 5 mg  5 mg Oral BID Nolberto Hanlon, MD       ascorbic acid (VITAMIN C) tablet 1,000 mg  1,000 mg Oral Daily Tacey Ruiz, MD   1,000 mg at 04/27/21 0954   atorvastatin (LIPITOR) tablet 40 mg  40 mg Oral Daily Tacey Ruiz, MD   40 mg at 04/27/21 0954   bisacodyl (DULCOLAX) EC tablet 5 mg  5 mg Oral Daily PRN Tacey Ruiz, MD   5 mg at 04/25/21 1315   calcium carbonate (TUMS - dosed in mg elemental calcium) chewable tablet 200 mg of elemental calcium  1 tablet Oral Daily  Nolberto Hanlon, MD   200 mg of elemental calcium at 04/27/21 0954   carvedilol (COREG) tablet 6.25 mg  6.25 mg Oral BID Nolberto Hanlon, MD   6.25 mg at 04/27/21 9833   cefdinir (OMNICEF) capsule 300 mg  300 mg Oral Q24H Fritzi Mandes, MD   300 mg at 04/26/21 1325   chlorpheniramine-HYDROcodone (TUSSIONEX) 10-8 MG/5ML suspension 5 mL  5 mL Oral Q12H PRN Mansy, Jan A, MD       cholecalciferol (VITAMIN D3) tablet 1,000 Units  1,000 Units Oral Daily Mansy, Jan A, MD   1,000 Units at 04/27/21 8250   feeding supplement (ENSURE ENLIVE / ENSURE PLUS) liquid 237 mL  1 Bottle Oral TID BM Nolberto Hanlon, MD   237 mL at 04/26/21 0959   guaiFENesin-dextromethorphan (ROBITUSSIN DM) 100-10 MG/5ML syrup 10 mL  10 mL Oral Q4H PRN Mansy, Jan A, MD       HYDROcodone-acetaminophen (NORCO/VICODIN) 5-325 MG per tablet 1-2 tablet  1-2 tablet Oral Q4H PRN Tacey Ruiz, MD       levothyroxine (SYNTHROID) tablet 25 mcg  25 mcg Oral Daily Tacey Ruiz, MD   25 mcg at 04/27/21 0554   magnesium citrate solution 1 Bottle  1 Bottle Oral Once PRN Tacey Ruiz, MD       metoprolol tartrate (LOPRESSOR) injection 5 mg  5 mg Intravenous Q6H PRN Tacey Ruiz, MD   5 mg at 04/25/21 1312   mirtazapine (REMERON) tablet 7.5 mg  7.5 mg Oral QHS Fritzi Mandes, MD       multivitamin with minerals tablet 1 tablet  1 tablet Oral Daily Nolberto Hanlon, MD   1 tablet at 04/27/21 0954   nitroGLYCERIN (NITROSTAT) SL tablet 0.4 mg  0.4 mg Sublingual Q5 min PRN Tacey Ruiz, MD       ondansetron (ZOFRAN) tablet 4 mg  4 mg Oral Q6H PRN Tacey Ruiz, MD       Or   ondansetron (ZOFRAN) injection 4 mg  4 mg Intravenous Q6H PRN Tacey Ruiz, MD   4 mg at 04/24/21 0847   pantoprazole (PROTONIX) EC tablet 40 mg  40 mg Oral Daily Nolberto Hanlon, MD   40 mg at 04/27/21 5397   senna-docusate (Senokot-S) tablet 1 tablet  1 tablet Oral QHS PRN Tacey Ruiz, MD       vitamin B-12 (CYANOCOBALAMIN) tablet 1,000 mcg  1,000 mcg Oral Daily Tacey Ruiz,  MD   1,000 mcg at 04/27/21 0957   zinc sulfate capsule 220 mg  220 mg Oral Daily Mansy, Jan A, MD   220 mg at 04/27/21 6734     Discharge Medications: Please see discharge summary for a list of discharge medications.  Relevant Imaging Results:  Relevant Lab Results:   Additional Information SSN:[id number]  Eileen Stanford, LCSW

## 2021-06-23 NOTE — Progress Notes (Signed)
Date:  06/26/2021   ID:  Linda Hart, DOB Mar 07, 1925, MRN [id number]  Patient Location:  [place] [address]   Provider location:   [facility], [place] office  PCP:  Kirk Ruths, MD  Cardiologist:  Arvid Right [facility]  Chief Complaint  Patient presents with   Follow up [facility] June admission     "Doing well." Medications reviewed by the patient's medication list.     History of Present Illness:    Linda Hart is a 87 y.o. female  past medical history of CAD (NSTEMI 03/06/13 s/p DES-LAD),  PAF (in setting of NSTEMI and reperfusion), not on anticoagulation given frequent falls ischemic cardiomyopathy (EF 30-35% on 03/09/13 echo), up to 40 to 45% November 2016 SVT (self-limited on OP monitor),  GERD, remote h/o lightheadedness and syncope who was re-admitted to [facility] with worsening DOE/SOB may 2014.   She lives at the [facility] EF 45% She presents today for follow-up of her coronary artery disease, cardiomyopathy  Presents with her daughter today Lives at the [facility] Continued weight loss, down another 10 pounds since last clinic visit  In hospital 03/2021 Presenting with shortness of breath and increase in edema of left lower extremity Diagnosed with acute hypoxic respiratory failure suspected due to acute CHF diastolic improved after receiving IV Lasix --v/q scan: Very low probability examination for pulmonary embolism by modified perfusion only PIOPED criteria (PE absent). Acute Left LE DVT: Suspect small amount of nonocclusive DVT within the right common femoral and distal femoral vein. Covid infection Lab work reviewed   BUN 40 (*)      Creatinine, Ser 1.50 (*)     In follow-up today,  " Why am I still here" ankle swelling Weak, in a wheelchair  Some social interaction Plays cards Unable to walk very far Would only be social and interact when picked up  Does not like food,  losing weight Daughter reports even when she goes out to restaurants she does not eat  EKG personally reviewed by myself on todays visit Normal sinus rhythm rate 82 bpm no significant ST-T wave changes     Past Medical History:  Diagnosis Date   COPD (chronic obstructive pulmonary disease) ([place])    Coronary artery disease 03/06/2013   90% prox LAD stenosis s/p DES   Difficult intubation    Dizziness    GERD (gastroesophageal reflux disease)    Hypotension    Ischemic cardiomyopathy 03/09/2013   s/p NSTEMI. EF 30-35%.   MI (myocardial infarction) ([place])    PAF (paroxysmal atrial fibrillation) ([place]) 03/06/2013   In setting of NSTEMI and reperfusion, no recurrence   SVT (supraventricular tachycardia) ([place])    Self-limited on outpatient cardiac monitor   Past Surgical History:  Procedure Laterality Date   CESAREAN SECTION     CORONARY ANGIOPLASTY WITH STENT PLACEMENT  02/2013   DES-mid LAD, mild LCx, RCA stenoses   INTRAMEDULLARY (IM) NAIL INTERTROCHANTERIC Right 08/19/2017   Procedure: INTRAMEDULLARY (IM) NAIL INTERTROCHANTRIC;  Surgeon: Hessie Knows, MD;  Location: [facility] ORS;  Service: Orthopedics;  Laterality: Right;     Current Meds  Medication Sig   acetaminophen (TYLENOL) 325 MG tablet Take 650 mg by mouth every 6 (six) hours as needed.   apixaban (ELIQUIS) 5 MG TABS tablet Take 2 tablets (10 mg total) by mouth 2 (two) times daily. And then 5 mg (1 tab) bid from 05/01/2021   Ascorbic Acid (  VITAMIN C) 1000 MG tablet Take 1,000 mg by mouth daily.   aspirin EC 81 MG tablet Take 1 tablet (81 mg total) by mouth daily.   carvedilol (COREG) 3.125 MG tablet TAKE ONE TABLET TWICE A DAY WITH MEALS.   carvedilol (COREG) 3.125 MG tablet Take 3.125 mg by mouth daily as needed (tachycardia, palpitations or HR >100).   Cholecalciferol 4000 units CAPS Take 4,000 Units by mouth every other day.   feeding supplement (ENSURE ENLIVE / ENSURE PLUS) LIQD Take 1 Bottle by mouth every other day.    furosemide (LASIX) 20 MG tablet Take 20 mg by mouth every other day.   guaiFENesin-dextromethorphan (ROBITUSSIN DM) 100-10 MG/5ML syrup Take 10 mLs by mouth every 4 (four) hours as needed for cough.   levothyroxine (SYNTHROID) 25 MCG tablet Take 25 mcg by mouth daily.   mirtazapine (REMERON) 7.5 MG tablet Take 1 tablet (7.5 mg total) by mouth at bedtime.   nitroGLYCERIN (NITROSTAT) 0.4 MG SL tablet Place 0.4 mg under the tongue every 5 (five) minutes as needed for chest pain.   omeprazole (PRILOSEC) 20 MG capsule Take 20 mg by mouth daily.   potassium chloride (MICRO-K) 10 MEQ CR capsule Take 10 mEq by mouth every other day.   spironolactone (ALDACTONE) 25 MG tablet Take 12.5 mg by mouth daily.   vitamin B-12 (CYANOCOBALAMIN) 1000 MCG tablet Take 1,000 mcg by mouth daily.      Allergies:   Penicillins   Social History   Tobacco Use   Smoking status: Never   Smokeless tobacco: Never  Vaping Use   Vaping Use: Never used  Substance Use Topics   Alcohol use: Not Currently    Comment: occasional   Drug use: No       Family Hx: The patient's family history includes Heart attack in her father; Heart failure in her father and mother.  ROS:   Please see the history of present illness.    Review of Systems  Constitutional: Negative.   HENT: Negative.    Respiratory: Negative.    Cardiovascular:  Positive for palpitations.  Gastrointestinal: Negative.   Musculoskeletal: Negative.        Gait instability  Neurological: Negative.   Psychiatric/Behavioral: Negative.    All other systems reviewed and are negative.   Labs/Other Tests and Data Reviewed:    Recent Labs: 03/08/2021: TSH 8.344 04/24/2021: B Natriuretic Peptide 769.3 04/26/2021: ALT 18; Hemoglobin 10.2; Platelets 202 04/27/2021: BUN 52; Creatinine, Ser 1.46; Magnesium 2.1; Potassium 4.6; Sodium 137   Recent Lipid Panel Lab Results  Component Value Date/Time   CHOL 151 04/22/2021 03:31 AM   CHOL 121 03/06/2013 03:43 AM    TRIG 29 04/22/2021 03:31 AM   TRIG 45 03/06/2013 03:43 AM   HDL 71 04/22/2021 03:31 AM   HDL 60 03/06/2013 03:43 AM   CHOLHDL 2.1 04/22/2021 03:31 AM   LDLCALC 74 04/22/2021 03:31 AM   LDLCALC 52 03/06/2013 03:43 AM   LDLDIRECT 60.0 08/23/2016 03:52 PM    Wt Readings from Last 3 Encounters:  06/26/21 75 lb 2 oz (34.1 kg)  04/27/21 85 lb 12.1 oz (38.9 kg)  03/08/21 87 lb (39.5 kg)     Exam:    BP 90/60 (BP Location: Left Arm, Patient Position: Sitting, Cuff Size: Normal)   Pulse 82   Ht '5\' 2"'$  (1.575 m)   Wt 75 lb 2 oz (34.1 kg)   SpO2 97%   BMI 13.74 kg/m  Constitutional:  oriented to person, place,  and time. No distress. Thin, in a wheelchair HENT:  Head: Grossly normal Eyes:  no discharge. No scleral icterus.  Neck: No JVD, no carotid bruits  Cardiovascular: Regular rate and rhythm, no murmurs appreciated Trace ankle swelling bilaterally, compression hose in place Pulmonary/Chest: Clear to auscultation bilaterally, no wheezes or rails Abdominal: Soft.  no distension.  no tenderness.  Musculoskeletal: Normal range of motion Neurological:  normal muscle tone. Coordination normal. No atrophy Skin: Skin warm and dry Psychiatric: normal affect, pleasant   ASSESSMENT & PLAN:    SVT (supraventricular tachycardia) (HCC) Occasional palpitations, Will have to decrease carvedilol down to 3.125 daily given hypotension from weight loss  PAF (paroxysmal atrial fibrillation) (HCC) Maintaining normal sinus rhythm, continue carvedilol but at reduced dose as above On anticoagulation for DVT  Severe protein calorie malnutrition 10 pound weight loss Consider palliative care Stressed importance of higher calorie intake Does not like the food   Cardiomyopathy, ischemic/coronary disease with stable angina Denies anginal symptoms, no further work-up at this time, too high risk  Essential hypertension Hypotension actually noted on today's visit, Blood pressure lower after  recent weight loss 12 pounds Hold spironolactone, decrease carvedilol down to 3.125 daily not twice daily  Systolic and diastolic CHF, acute on chronic (HCC) Appears euvolemic, Med changes as above   Total encounter time more than 35 minutes  Greater than 50% was spent in counseling and coordination of care with the patient    Signed, Ida Rogue, MD  06/26/2021 10:54 AM    [place] [id number], [place], [place] [address]

## 2021-06-26 ENCOUNTER — Other Ambulatory Visit: Payer: Self-pay

## 2021-06-26 ENCOUNTER — Ambulatory Visit (INDEPENDENT_AMBULATORY_CARE_PROVIDER_SITE_OTHER): Payer: Medicare Other | Admitting: Cardiovascular Disease

## 2021-06-26 ENCOUNTER — Encounter: Payer: Self-pay | Admitting: Cardiovascular Disease

## 2021-06-26 VITALS — BP 90/60 | HR 82 | Ht 62.0 in | Wt 75.1 lb

## 2021-06-26 DIAGNOSIS — I25118 Atherosclerotic heart disease of native coronary artery with other forms of angina pectoris: Secondary | ICD-10-CM | POA: Diagnosis not present

## 2021-06-26 DIAGNOSIS — I480 Paroxysmal atrial fibrillation: Secondary | ICD-10-CM | POA: Diagnosis not present

## 2021-06-26 DIAGNOSIS — I471 Supraventricular tachycardia: Secondary | ICD-10-CM | POA: Diagnosis not present

## 2021-06-26 DIAGNOSIS — I10 Essential (primary) hypertension: Secondary | ICD-10-CM

## 2021-06-26 DIAGNOSIS — I255 Ischemic cardiomyopathy: Secondary | ICD-10-CM

## 2021-06-26 DIAGNOSIS — I5043 Acute on chronic combined systolic (congestive) and diastolic (congestive) heart failure: Secondary | ICD-10-CM

## 2021-06-26 MED ORDER — APIXABAN 2.5 MG PO TABS: 2.5000 mg | ORAL_TABLET | Freq: Two times a day (BID) | ORAL | 11 refills | Status: AC

## 2021-06-26 MED ORDER — APIXABAN 5 MG PO TABS: 5.0000 mg | ORAL_TABLET | Freq: Two times a day (BID) | ORAL | 0 refills | Status: AC

## 2021-06-26 NOTE — Patient Instructions (Addendum)
Medication Instructions:   Please START Eliquis 5 mg twice a day  through September In October, Start eliquis 2.5 mg twice a day  Carvedilol to 3.125 mg daily  Please STOP spironolactone  If you need a refill on your cardiac medications before your next appointment, please call your pharmacy.   Lab work: No new labs needed  Testing/Procedures: No new testing needed  Follow-Up: At [facility], you and your health needs are our priority.  As part of our continuing mission to provide you with exceptional heart care, we have created designated Provider Care Teams.  These Care Teams include your primary Cardiologist (physician) and Advanced Practice Providers (APPs -  Physician Assistants and Nurse Practitioners) who all work together to provide you with the care you need, when you need it.  You will need a follow up appointment in 6 months  Providers on your designated Care Team:   Murray Hodgkins, NP Christell Faith, PA-C Marrianne Mood, PA-C Cadence [place], [place]  COVID-19 Vaccine Information can be found at: [email or web address].uk For questions related to vaccine distribution or appointments, please email [email or web address] or call [phone].

## 2021-10-26 ENCOUNTER — Non-Acute Institutional Stay: Payer: Medicare Other | Admitting: Student

## 2021-10-26 ENCOUNTER — Other Ambulatory Visit: Payer: Self-pay

## 2021-10-26 DIAGNOSIS — E43 Unspecified severe protein-calorie malnutrition: Secondary | ICD-10-CM

## 2021-10-26 DIAGNOSIS — I5042 Chronic combined systolic (congestive) and diastolic (congestive) heart failure: Secondary | ICD-10-CM

## 2021-10-26 DIAGNOSIS — F0390 Unspecified dementia without behavioral disturbance: Secondary | ICD-10-CM

## 2021-10-26 DIAGNOSIS — Z515 Encounter for palliative care: Secondary | ICD-10-CM

## 2021-10-26 DIAGNOSIS — R634 Abnormal weight loss: Secondary | ICD-10-CM

## 2021-10-26 NOTE — Progress Notes (Signed)
[facility] Palliative Care Consult Note Telephone: [phone]  Fax: [phone]   Date of encounter: 10/26/21 10:20 AM PATIENT NAME: Linda Hart [place] [address]   [phone] (home)  DOB: 1924/12/28 MRN: [id number] PRIMARY CARE PROVIDER:    Kirk Ruths, MD,  [place] [id number] [phone]  REFERRING PROVIDER:   Kirk Ruths, MD [facility] [place],  [place] [address] [phone]  RESPONSIBLE PARTY:    Contact Information     Name Relation Home Work Mobile   Rodriguez,Katherine Daughter [phone]   Rodriguez,Ray Relative [phone]     Joetta Manners Daughter   [phone]        I met face to face with patient in the facility. Palliative Care was asked to follow this patient by consultation request of  Kirk Ruths, MD to address advance care planning and complex medical decision making. This is the initial visit.   Spoke with daughter Belenda Cruise via telephone.                                     ASSESSMENT AND PLAN / RECOMMENDATIONS:   Advance Care Planning/Goals of Care: Goals include to maximize quality of life and symptom management. Patient/health care surrogate gave his/her permission to discuss.Our advance care planning conversation included a discussion about:    The value and importance of advance care planning  Experiences with loved ones who have been seriously ill or have died  Exploration of personal, cultural or spiritual beliefs that might influence medical decisions  Exploration of goals of care in the event of a sudden injury or illness  Decision not to resuscitate or to de-escalate disease focused treatments due to poor prognosis. CODE STATUS: DNR  Education provided on Palliative Medicine vs. Hospice services. Patient does meet criteria for hospice  due to her continued weight loss, protein calorie malnutrition. Hospice medical director in agreement. Discussed with daughter; she expresses functional and cognitive changes. She agrees with proceeding with hospice referral. SW at [facility] notified and asked to fax over hospice order.   Symptom Management/Plan:  Protein calorie malnutrition, weight loss-patient eating 0-25% of meals. Continue to offer foods patient enjoys, continue mirtazapine 7.5 mg QHS. Nutritional supplement ordered TID per facility.   Combined diastolic and systolic HF-continue torsemide, carvedilol as directed. Daily weights. Recommend elevating legs when in bed or chair. Continue compression stockings.   Dementia-reorient, redirect as needed. Monitor for falls/safety.   Follow up Palliative Care Visit: Palliative care will continue to follow for complex medical decision making, advance care planning, and clarification of goals. Return in 6-8 weeks or prn.  I spent 45 minutes providing this consultation. More than 50% of the time in this consultation was spent in counseling and care coordination.   PPS: 40%  HOSPICE ELIGIBILITY/DIAGNOSIS: protein calorie malnutrition.  Chief Complaint: Palliative Medicine initial consult.   HISTORY OF PRESENT ILLNESS:  Linda Hart is a 87 y.o. year old female  with dementia, protein calorie malnutrition, COPD, ASCVD, combined diastolic and systolic CHF, ischemic cardiomyopathy,  hypertension, hyperlipidemia, paroxysmal atrial fibrillation, GERD.  Patient resides at [facility]. She is able to complete some adl's. Requires assistance with compression hose. Occasional incontinence of bowel and bladder. Uses walker for ambulation; calls for assistance. Endorses a poor appetite; states she  does not like the food. She has nutritional supplement ordered TID. Daughter is unsure if she is actually drinking them. She denies pain, shortness of breath, nausea, constipation. She does have  worsening LE edema. She does endorse sleeping well at night. Daughter reports patient having a functional and cognitive decline. [place] [facility] [place] to [facility] in [place]. Patient is having more difficulty carrying a conversation. She states patient with increased weakness, she tires very easily when she takes her out. Patient has had two falls in past two months; sustained left wrist fracture in October.   Patient with ongoing weight loss, current weight 72 pounds, BMI 14.5. 78 pounds in November, was 85 pounds 04/27/21. Greater than 10% loss in past 6 months. Last albumin 2.5 in June, 2022.   History obtained from review of EMR, discussion with primary team, and interview with family, facility staff/caregiver and/or Ms. Garmon.  I reviewed available labs, medications, imaging, studies and related documents from the EMR.  Records reviewed and summarized above.   ROS  General: NAD EYES: denies vision changes ENMT: denies dysphagia Cardiovascular: denies chest pain, denies DOE Pulmonary: denies cough, denies increased SOB Abdomen: denies constipation GU: denies dysuria MSK: weakness, two falls reported Skin: denies rashes or wounds Neurological: denies pain, denies insomnia Psych: Endorses stable mood Heme/lymph/immuno: denies bruises, abnormal bleeding  Physical Exam: Pulse 72, resp 18, b/p 138/60 Constitutional: NAD General: frail appearing, very thin EYES: anicteric sclera, lids intact, no discharge  ENMT: intact hearing, oral mucous membranes moist, dentition intact CV: S1S2, RRR, no LE edema Pulmonary: LCTA, no increased work of breathing, no cough, room air Abdomen: normo-active BS + 4 quadrants, soft and non tender, no ascites GU: deferred MSK: sarcopenia, moves all extremities, ambulatory with walker Skin: warm and dry, no rashes or wounds on visible skin Neuro: generalized weakness, A & O x 2, forgetful Psych: non-anxious affect, pleasant Hem/lymph/immuno: no widespread  bruising CURRENT PROBLEM LIST:  Patient Active Problem List   Diagnosis Date Noted   COVID-19 virus infection    Protein-calorie malnutrition, severe 04/24/2021   Acute deep vein thrombosis (DVT) of left lower extremity ([place]) 04/21/2021   Acute pulmonary embolism ([place]) 04/21/2021   Hypoxia 04/21/2021   AKI (acute kidney injury) ([place]) 04/21/2021   COPD with asthma ([place]) 08/11/2018   GERD without esophagitis 05/12/2018   Closed right hip fracture (HCC) 08/17/2017   Cutaneous lupus erythematosus 04/05/2017   Anorexia 04/20/2016   Bradycardia 04/20/2016   Chronic combined systolic and diastolic heart failure (HCC) 09/24/2015   Acute on chronic congestive heart failure (HCC)    Anemia 01/26/2014   Dry eyes 01/04/2014   Cardiomyopathy, ischemic 03/17/2013   Hyperlipidemia 03/17/2013   CAD in native artery 03/17/2013   PAF (paroxysmal atrial fibrillation) ([place]) 03/17/2013   Pernicious anemia 07/10/2011   SVT (supraventricular tachycardia) ([place]) 03/21/2011   PAST MEDICAL HISTORY:  Active Ambulatory Problems    Diagnosis Date Noted   SVT (supraventricular tachycardia) ([place]) 03/21/2011   Pernicious anemia 07/10/2011   Cardiomyopathy, ischemic 03/17/2013   Hyperlipidemia 03/17/2013   CAD in native artery 03/17/2013   PAF (paroxysmal atrial fibrillation) ([place]) 03/17/2013   Dry eyes 01/04/2014   Anemia 01/26/2014   Chronic combined systolic and diastolic heart failure ([place]) 09/24/2015   Acute on chronic congestive heart failure ([place])    Anorexia 04/20/2016   Bradycardia 04/20/2016   Cutaneous lupus erythematosus 04/05/2017   Closed right hip fracture ([place]) 08/17/2017   GERD without esophagitis 05/12/2018   COPD with  asthma ([place]) 08/11/2018   Acute deep vein thrombosis (DVT) of left lower extremity ([place]) 04/21/2021   Acute pulmonary embolism ([place]) 04/21/2021   Hypoxia 04/21/2021   AKI (acute kidney injury) ([place]) 04/21/2021   Protein-calorie malnutrition, severe 04/24/2021    COVID-19 virus infection    Resolved Ambulatory Problems    Diagnosis Date Noted   HYPERTENSION, BENIGN 08/28/2010   Palpitations 08/28/2010   Depression 03/21/2011   Heart palpitations 12/05/2011   Right leg numbness 12/05/2011   Hypertension 02/14/2012   Acute renal insufficiency 03/17/2013   Ear bleeding 07/14/2013   Shortness of breath 12/23/2013   Cough 01/04/2014   Viral URI with cough 11/08/2014   Orthostatic hypotension 07/26/2015   Hematoma of left lower leg 09/12/2015   Nosebleed 09/12/2015   CHF (congestive heart failure) ([place]) 09/23/2015   Bronchitis    Belching 09/28/2015   Nasal congestion 04/05/2017   Do not resuscitate discussion 04/05/2017   Past Medical History:  Diagnosis Date   COPD (chronic obstructive pulmonary disease) ([place])    Coronary artery disease 03/06/2013   Difficult intubation    Dizziness    GERD (gastroesophageal reflux disease)    Hypotension    Ischemic cardiomyopathy 03/09/2013   MI (myocardial infarction) ([place])    SOCIAL HX:  Social History   Tobacco Use   Smoking status: Never   Smokeless tobacco: Never  Substance Use Topics   Alcohol use: Not Currently    Comment: occasional   FAMILY HX:  Family History  Problem Relation Age of Onset   Heart failure Mother    Heart failure Father    Heart attack Father      ALLERGIES:  Allergies  Allergen Reactions   Penicillins     Has patient had a PCN reaction causing immediate rash, facial/tongue/throat swelling, SOB or lightheadedness with hypotension: Unknown Has patient had a PCN reaction causing severe rash involving mucus membranes or skin necrosis: Unknown Has patient had a PCN reaction that required hospitalization No Has patient had a PCN reaction occurring within the last 10 years: No If all of the above answers are "NO", then may proceed with Cephalosporin use.     PERTINENT MEDICATIONS:  Outpatient Encounter Medications as of 10/26/2021  Medication Sig    acetaminophen (TYLENOL) 325 MG tablet Take 650 mg by mouth every 6 (six) hours as needed.   apixaban (ELIQUIS) 2.5 MG TABS tablet Take 1 tablet (2.5 mg total) by mouth 2 (two) times daily.   apixaban (ELIQUIS) 5 MG TABS tablet Take 1 tablet (5 mg total) by mouth 2 (two) times daily. And then 5 mg (1 tab) bid from 05/01/2021   Ascorbic Acid (VITAMIN C) 1000 MG tablet Take 1,000 mg by mouth daily.   aspirin EC 81 MG tablet Take 1 tablet (81 mg total) by mouth daily.   carvedilol (COREG) 3.125 MG tablet Take 3.125 mg by mouth daily.   Cholecalciferol 4000 units CAPS Take 4,000 Units by mouth every other day.   feeding supplement (ENSURE ENLIVE / ENSURE PLUS) LIQD Take 1 Bottle by mouth every other day.   furosemide (LASIX) 20 MG tablet Take 20 mg by mouth every other day.   guaiFENesin-dextromethorphan (ROBITUSSIN DM) 100-10 MG/5ML syrup Take 10 mLs by mouth every 4 (four) hours as needed for cough.   levothyroxine (SYNTHROID) 25 MCG tablet Take 25 mcg by mouth daily.   mirtazapine (REMERON) 7.5 MG tablet Take 1 tablet (7.5 mg total) by mouth at bedtime.   nitroGLYCERIN (NITROSTAT) 0.4  MG SL tablet Place 0.4 mg under the tongue every 5 (five) minutes as needed for chest pain.   omeprazole (PRILOSEC) 20 MG capsule Take 20 mg by mouth daily.   potassium chloride (MICRO-K) 10 MEQ CR capsule Take 10 mEq by mouth every other day.   vitamin B-12 (CYANOCOBALAMIN) 1000 MCG tablet Take 1,000 mcg by mouth daily.    No facility-administered encounter medications on file as of 10/26/2021.   Thank you for the opportunity to participate in the care of Ms. Inglett.  The palliative care team will continue to follow. Please call our office at [phone] if we can be of additional assistance.   Ezekiel Slocumb, NP   COVID-19 PATIENT SCREENING TOOL Asked and negative response unless otherwise noted:  Have you had symptoms of covid, tested positive or been in contact with someone with symptoms/positive test in the  past 5-10 days? No

## 2021-11-29 DEATH — deceased

## 2021-12-25 ENCOUNTER — Ambulatory Visit: Payer: Medicare Other | Admitting: Cardiovascular Disease
